# Patient Record
Sex: Female | Born: 1935
Health system: Southern US, Community
[De-identification: ages and names within clinical notes are randomized; demographics above are authoritative.]

## PROBLEM LIST (undated history)

## (undated) DIAGNOSIS — Z853 Personal history of malignant neoplasm of breast: Secondary | ICD-10-CM

## (undated) DIAGNOSIS — N3946 Mixed incontinence: Secondary | ICD-10-CM

## (undated) DIAGNOSIS — M545 Low back pain, unspecified: Secondary | ICD-10-CM

## (undated) DIAGNOSIS — IMO0001 Reserved for inherently not codable concepts without codable children: Secondary | ICD-10-CM

## (undated) DIAGNOSIS — N3281 Overactive bladder: Secondary | ICD-10-CM

## (undated) DIAGNOSIS — K219 Gastro-esophageal reflux disease without esophagitis: Secondary | ICD-10-CM

## (undated) DIAGNOSIS — K222 Esophageal obstruction: Secondary | ICD-10-CM

## (undated) DIAGNOSIS — R519 Headache, unspecified: Secondary | ICD-10-CM

## (undated) DIAGNOSIS — N329 Bladder disorder, unspecified: Secondary | ICD-10-CM

## (undated) DIAGNOSIS — I6930 Unspecified sequelae of cerebral infarction: Secondary | ICD-10-CM

## (undated) DIAGNOSIS — Z972 Presence of dental prosthetic device (complete) (partial): Secondary | ICD-10-CM

## (undated) DIAGNOSIS — Z9889 Other specified postprocedural states: Secondary | ICD-10-CM

## (undated) DIAGNOSIS — I5032 Chronic diastolic (congestive) heart failure: Secondary | ICD-10-CM

## (undated) DIAGNOSIS — K08109 Complete loss of teeth, unspecified cause, unspecified class: Secondary | ICD-10-CM

## (undated) DIAGNOSIS — Z9181 History of falling: Secondary | ICD-10-CM

## (undated) DIAGNOSIS — R51 Headache: Secondary | ICD-10-CM

## (undated) DIAGNOSIS — Z8719 Personal history of other diseases of the digestive system: Secondary | ICD-10-CM

## (undated) DIAGNOSIS — I872 Venous insufficiency (chronic) (peripheral): Secondary | ICD-10-CM

## (undated) DIAGNOSIS — H3530 Unspecified macular degeneration: Secondary | ICD-10-CM

## (undated) DIAGNOSIS — M5431 Sciatica, right side: Secondary | ICD-10-CM

## (undated) DIAGNOSIS — I10 Essential (primary) hypertension: Secondary | ICD-10-CM

## (undated) DIAGNOSIS — H5450 Low vision, one eye, unspecified eye: Secondary | ICD-10-CM

## (undated) DIAGNOSIS — R600 Localized edema: Secondary | ICD-10-CM

## (undated) DIAGNOSIS — I8390 Asymptomatic varicose veins of unspecified lower extremity: Secondary | ICD-10-CM

## (undated) DIAGNOSIS — M1990 Unspecified osteoarthritis, unspecified site: Secondary | ICD-10-CM

## (undated) DIAGNOSIS — J45909 Unspecified asthma, uncomplicated: Secondary | ICD-10-CM

## (undated) DIAGNOSIS — R269 Unspecified abnormalities of gait and mobility: Secondary | ICD-10-CM

## (undated) DIAGNOSIS — I2510 Atherosclerotic heart disease of native coronary artery without angina pectoris: Secondary | ICD-10-CM

## (undated) DIAGNOSIS — J449 Chronic obstructive pulmonary disease, unspecified: Secondary | ICD-10-CM

## (undated) DIAGNOSIS — R011 Cardiac murmur, unspecified: Secondary | ICD-10-CM

## (undated) DIAGNOSIS — F419 Anxiety disorder, unspecified: Secondary | ICD-10-CM

## (undated) DIAGNOSIS — M797 Fibromyalgia: Secondary | ICD-10-CM

## (undated) DIAGNOSIS — K5710 Diverticulosis of small intestine without perforation or abscess without bleeding: Secondary | ICD-10-CM

## (undated) DIAGNOSIS — R42 Dizziness and giddiness: Secondary | ICD-10-CM

## (undated) DIAGNOSIS — G8929 Other chronic pain: Secondary | ICD-10-CM

## (undated) HISTORY — PX: CARDIAC CATHETERIZATION: SHX172

## (undated) HISTORY — DX: Sciatica, right side: M54.31

## (undated) HISTORY — PX: CATARACT EXTRACTION W/ INTRAOCULAR LENS  IMPLANT, BILATERAL: SHX1307

---
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
## 1969-10-05 HISTORY — PX: ANTERIOR CERVICAL DECOMP/DISCECTOMY FUSION: SHX1161

## 1991-10-06 HISTORY — PX: ABDOMINAL ADHESION SURGERY: SHX90

## 1997-10-05 HISTORY — PX: MASTECTOMY, PARTIAL: SHX709

## 1999-07-12 ENCOUNTER — Emergency Department (HOSPITAL_COMMUNITY): Admission: EM | Admit: 1999-07-12 | Discharge: 1999-07-12 | Payer: Self-pay | Admitting: Emergency Medicine

## 1999-07-12 ENCOUNTER — Encounter: Payer: Self-pay | Admitting: Emergency Medicine

## 1999-11-10 ENCOUNTER — Emergency Department (HOSPITAL_COMMUNITY): Admission: EM | Admit: 1999-11-10 | Discharge: 1999-11-10 | Payer: Self-pay | Admitting: Emergency Medicine

## 1999-11-10 ENCOUNTER — Encounter: Payer: Self-pay | Admitting: Emergency Medicine

## 1999-12-08 ENCOUNTER — Ambulatory Visit (HOSPITAL_COMMUNITY): Admission: RE | Admit: 1999-12-08 | Discharge: 1999-12-08 | Payer: Self-pay | Admitting: Gastroenterology

## 1999-12-08 ENCOUNTER — Encounter: Payer: Self-pay | Admitting: Gastroenterology

## 2000-01-27 ENCOUNTER — Inpatient Hospital Stay (HOSPITAL_COMMUNITY): Admission: EM | Admit: 2000-01-27 | Discharge: 2000-01-29 | Payer: Self-pay | Admitting: Emergency Medicine

## 2000-01-27 ENCOUNTER — Encounter (INDEPENDENT_AMBULATORY_CARE_PROVIDER_SITE_OTHER): Payer: Self-pay | Admitting: Specialist

## 2000-01-30 ENCOUNTER — Inpatient Hospital Stay (HOSPITAL_COMMUNITY): Admission: EM | Admit: 2000-01-30 | Discharge: 2000-02-03 | Payer: Self-pay | Admitting: *Deleted

## 2000-01-30 ENCOUNTER — Encounter (INDEPENDENT_AMBULATORY_CARE_PROVIDER_SITE_OTHER): Payer: Self-pay | Admitting: *Deleted

## 2001-06-22 ENCOUNTER — Encounter: Admission: RE | Admit: 2001-06-22 | Discharge: 2001-06-22 | Payer: Self-pay | Admitting: Gastroenterology

## 2001-06-22 ENCOUNTER — Encounter: Payer: Self-pay | Admitting: Gastroenterology

## 2001-09-26 ENCOUNTER — Ambulatory Visit (HOSPITAL_COMMUNITY): Admission: RE | Admit: 2001-09-26 | Discharge: 2001-09-26 | Payer: Self-pay | Admitting: Gastroenterology

## 2001-09-26 ENCOUNTER — Encounter: Payer: Self-pay | Admitting: Gastroenterology

## 2001-11-29 ENCOUNTER — Ambulatory Visit (HOSPITAL_COMMUNITY): Admission: RE | Admit: 2001-11-29 | Discharge: 2001-11-30 | Payer: Self-pay | Admitting: Cardiology

## 2002-07-11 ENCOUNTER — Encounter: Payer: Self-pay | Admitting: Gastroenterology

## 2002-07-11 ENCOUNTER — Encounter: Admission: RE | Admit: 2002-07-11 | Discharge: 2002-07-11 | Payer: Self-pay | Admitting: Gastroenterology

## 2003-03-08 ENCOUNTER — Ambulatory Visit (HOSPITAL_COMMUNITY): Admission: RE | Admit: 2003-03-08 | Discharge: 2003-03-08 | Payer: Self-pay | Admitting: Gastroenterology

## 2003-03-08 ENCOUNTER — Encounter: Payer: Self-pay | Admitting: Gastroenterology

## 2003-06-04 ENCOUNTER — Encounter: Payer: Self-pay | Admitting: Gastroenterology

## 2003-06-04 ENCOUNTER — Encounter: Admission: RE | Admit: 2003-06-04 | Discharge: 2003-06-04 | Payer: Self-pay | Admitting: Gastroenterology

## 2003-06-25 ENCOUNTER — Encounter: Payer: Self-pay | Admitting: Emergency Medicine

## 2003-06-25 ENCOUNTER — Observation Stay (HOSPITAL_COMMUNITY): Admission: EM | Admit: 2003-06-25 | Discharge: 2003-06-26 | Payer: Self-pay | Admitting: Emergency Medicine

## 2003-06-26 ENCOUNTER — Encounter (INDEPENDENT_AMBULATORY_CARE_PROVIDER_SITE_OTHER): Payer: Self-pay | Admitting: Cardiology

## 2003-07-05 ENCOUNTER — Encounter: Payer: Self-pay | Admitting: Emergency Medicine

## 2003-07-05 ENCOUNTER — Emergency Department (HOSPITAL_COMMUNITY): Admission: EM | Admit: 2003-07-05 | Discharge: 2003-07-05 | Payer: Self-pay | Admitting: Emergency Medicine

## 2003-07-17 ENCOUNTER — Encounter: Payer: Self-pay | Admitting: Gastroenterology

## 2003-07-17 ENCOUNTER — Ambulatory Visit (HOSPITAL_COMMUNITY): Admission: RE | Admit: 2003-07-17 | Discharge: 2003-07-17 | Payer: Self-pay | Admitting: Gastroenterology

## 2003-10-06 HISTORY — PX: KNEE ARTHROSCOPY: SHX127

## 2004-07-29 ENCOUNTER — Ambulatory Visit (HOSPITAL_COMMUNITY): Admission: RE | Admit: 2004-07-29 | Discharge: 2004-07-29 | Payer: Self-pay | Admitting: Gastroenterology

## 2004-10-05 HISTORY — PX: BREAST LUMPECTOMY: SHX2

## 2004-11-10 ENCOUNTER — Ambulatory Visit: Payer: Self-pay | Admitting: General Surgery

## 2005-06-02 ENCOUNTER — Ambulatory Visit: Payer: Self-pay | Admitting: General Surgery

## 2005-06-23 ENCOUNTER — Ambulatory Visit: Payer: Self-pay | Admitting: General Surgery

## 2005-08-04 ENCOUNTER — Other Ambulatory Visit: Payer: Self-pay

## 2005-08-04 ENCOUNTER — Ambulatory Visit: Payer: Self-pay | Admitting: General Surgery

## 2005-08-11 ENCOUNTER — Ambulatory Visit: Payer: Self-pay | Admitting: General Surgery

## 2005-08-24 ENCOUNTER — Ambulatory Visit: Payer: Self-pay | Admitting: Oncology

## 2005-09-11 ENCOUNTER — Ambulatory Visit: Payer: Self-pay | Admitting: Radiation Oncology

## 2005-10-05 ENCOUNTER — Ambulatory Visit: Payer: Self-pay | Admitting: Radiation Oncology

## 2005-11-05 ENCOUNTER — Ambulatory Visit: Payer: Self-pay | Admitting: Radiation Oncology

## 2005-12-03 ENCOUNTER — Ambulatory Visit: Payer: Self-pay | Admitting: Radiation Oncology

## 2006-02-24 ENCOUNTER — Ambulatory Visit: Payer: Self-pay | Admitting: Oncology

## 2006-06-14 ENCOUNTER — Ambulatory Visit: Payer: Self-pay | Admitting: General Surgery

## 2006-10-05 HISTORY — PX: PATELLA FRACTURE SURGERY: SHX735

## 2006-12-13 ENCOUNTER — Ambulatory Visit: Payer: Self-pay | Admitting: General Surgery

## 2007-06-15 ENCOUNTER — Ambulatory Visit: Payer: Self-pay | Admitting: General Surgery

## 2007-08-08 ENCOUNTER — Ambulatory Visit: Payer: Self-pay | Admitting: General Surgery

## 2007-09-16 ENCOUNTER — Ambulatory Visit: Payer: Self-pay | Admitting: General Surgery

## 2007-09-20 ENCOUNTER — Inpatient Hospital Stay: Payer: Self-pay | Admitting: General Surgery

## 2007-11-28 ENCOUNTER — Encounter: Admission: RE | Admit: 2007-11-28 | Discharge: 2007-11-28 | Payer: Self-pay | Admitting: Gastroenterology

## 2007-12-04 ENCOUNTER — Ambulatory Visit: Payer: Self-pay | Admitting: Radiation Oncology

## 2008-01-04 ENCOUNTER — Ambulatory Visit: Payer: Self-pay | Admitting: Radiation Oncology

## 2008-02-03 ENCOUNTER — Ambulatory Visit: Payer: Self-pay | Admitting: Radiation Oncology

## 2008-03-05 ENCOUNTER — Ambulatory Visit: Payer: Self-pay | Admitting: Radiation Oncology

## 2008-06-05 ENCOUNTER — Ambulatory Visit: Payer: Self-pay | Admitting: Radiation Oncology

## 2008-06-18 ENCOUNTER — Ambulatory Visit: Payer: Self-pay | Admitting: General Surgery

## 2010-06-26 ENCOUNTER — Ambulatory Visit: Payer: Self-pay | Admitting: General Surgery

## 2011-01-07 ENCOUNTER — Emergency Department (HOSPITAL_COMMUNITY)
Admission: EM | Admit: 2011-01-07 | Discharge: 2011-01-07 | Disposition: A | Discharge status: Home / Self Care | Payer: Medicare Other | Attending: Emergency Medicine | Admitting: Emergency Medicine

## 2011-01-07 DIAGNOSIS — R197 Diarrhea, unspecified: Secondary | ICD-10-CM | POA: Insufficient documentation

## 2011-01-07 DIAGNOSIS — K5289 Other specified noninfective gastroenteritis and colitis: Secondary | ICD-10-CM | POA: Insufficient documentation

## 2011-01-07 DIAGNOSIS — Z79899 Other long term (current) drug therapy: Secondary | ICD-10-CM | POA: Insufficient documentation

## 2011-01-07 DIAGNOSIS — E876 Hypokalemia: Secondary | ICD-10-CM | POA: Insufficient documentation

## 2011-01-07 DIAGNOSIS — R109 Unspecified abdominal pain: Secondary | ICD-10-CM | POA: Insufficient documentation

## 2011-01-07 DIAGNOSIS — K5730 Diverticulosis of large intestine without perforation or abscess without bleeding: Secondary | ICD-10-CM | POA: Insufficient documentation

## 2011-01-07 DIAGNOSIS — Z853 Personal history of malignant neoplasm of breast: Secondary | ICD-10-CM | POA: Insufficient documentation

## 2011-01-07 LAB — URINALYSIS, ROUTINE W REFLEX MICROSCOPIC
Glucose, UA: NEGATIVE mg/dL
Protein, ur: NEGATIVE mg/dL
Urobilinogen, UA: 0.2 mg/dL (ref 0.0–1.0)

## 2011-01-07 LAB — COMPREHENSIVE METABOLIC PANEL
Alkaline Phosphatase: 112 U/L (ref 39–117)
BUN: 31 mg/dL — ABNORMAL HIGH (ref 6–23)
CO2: 26 mEq/L (ref 19–32)
Chloride: 96 mEq/L (ref 96–112)
Creatinine, Ser: 1.76 mg/dL — ABNORMAL HIGH (ref 0.4–1.2)
GFR calc non Af Amer: 28 mL/min — ABNORMAL LOW (ref >60)
Glucose, Bld: 105 mg/dL — ABNORMAL HIGH (ref 70–99)
Potassium: 3 mEq/L — ABNORMAL LOW (ref 3.5–5.1)
Total Bilirubin: 0.6 mg/dL (ref 0.3–1.2)

## 2011-01-07 LAB — DIFFERENTIAL
Basophils Absolute: 0 10*3/uL (ref 0.0–0.1)
Basophils Relative: 0 % (ref 0–1)
Eosinophils Absolute: 0.1 10*3/uL (ref 0.0–0.7)
Lymphs Abs: 1.5 10*3/uL (ref 0.7–4.0)
Monocytes Relative: 8 % (ref 3–12)

## 2011-01-07 LAB — CBC
HCT: 39.8 % (ref 36.0–46.0)
Hemoglobin: 12.8 g/dL (ref 12.0–15.0)
MCH: 27 pg (ref 26.0–34.0)
MCV: 84 fL (ref 78.0–100.0)
RBC: 4.74 MIL/uL (ref 3.87–5.11)
WBC: 12.7 10*3/uL — ABNORMAL HIGH (ref 4.0–10.5)

## 2011-01-10 ENCOUNTER — Emergency Department (HOSPITAL_COMMUNITY)
Admission: EM | Admit: 2011-01-10 | Discharge: 2011-01-10 | Disposition: A | Discharge status: Home / Self Care | Payer: Medicare Other | Attending: Emergency Medicine | Admitting: Emergency Medicine

## 2011-01-10 DIAGNOSIS — K219 Gastro-esophageal reflux disease without esophagitis: Secondary | ICD-10-CM | POA: Insufficient documentation

## 2011-01-10 DIAGNOSIS — R0789 Other chest pain: Secondary | ICD-10-CM | POA: Insufficient documentation

## 2011-02-20 NOTE — Procedures (Signed)
[place]. [facility]  Patient:    Kristie Morris, Kristie Morris                        MRN: [id number] Proc. Date: 01/27/00 Adm. Date:  [id number] Attending:  Ernie Avena                           Procedure Report  DATE OF BIRTH:  1925-01-19  PROCEDURE PERFORMED:  Colonoscopy with biopsies.  ENDOSCOPIST:  Cleotis Nipper, M.D.  INDICATIONS FOR PROCEDURE:  The patient is a 77 year old with severe profuse watery diarrhea of 48 hours duration beginning on day 5 of Avelox, but also occurring after eating meatloaf.  Of note, the patient has a prior history of Clostridium difficile infection approximately seven years ago.  FINDINGS:  Patchy hemorrhagic pancolitis.  DESCRIPTION OF PROCEDURE:  The nature, purpose and risks of the procedure were familiar to the patient, who provided written consent.  Sedation was fentanyl 75 mcg and Versed 7 mg IV without arrhythmias or desaturation.  The Olympus adjustable tension pediatric video colonoscope was advanced with slight difficulty through an angulated sigmoid region around the colon to what I believe was the area just above the cecum, as demonstrated by transillumination of the right lower quadrant of the abdomen.   Pullback was then performed.  There was a fair amount of liquid stool in the colon which was suctioned and sent for analysis to include enteric pathogens, O&P, Clostridium difficile and white blood cells.  The striking finding on this exam was patchy mucosal hemorrhage involving pretty much all areas of the colon except relative sparing of the rectum.  In the sigmoid region, there was some exudative character to this with granularity as well.  The intervening colonic mucosa still had a normal vascular pattern.  This did not look like classic pseudomembranous colitis in that no obvious pseudomembranes or white plaques were observed.  It did not look like ulcerative colitis or ischemic colitis,  either.  Multiple biopsies were obtained from the areas of mucosal hemorrhage as well as from the areas of exudate and retroflexion was not performed.  The patient tolerated the procedure well.  There were no apparent complications.  IMPRESSION:  Colitis of undetermined etiology. In view of her antecedent exposure to antibiotics, I will empirically start oral vancomycin and it should be noted that the patient has a prior history of an adverse reaction to metronidazole.  We will await pathology on todays biopsies. DD:  01/27/00 TD:  01/28/00 Job: [id number]

## 2011-02-20 NOTE — Op Note (Signed)
NAME:  Kristie Morris, Kristie Morris                           ACCOUNT NO.:  [id number]   MEDICAL RECORD NO.:  [id number]                   PATIENT TYPE:  AMB   LOCATION:  ENDO                                 FACILITY:  [facility]   PHYSICIAN:  Ronald Lobo, M.D.                DATE OF BIRTH:  16-Jan-1925   DATE OF PROCEDURE:  03/08/2003  DATE OF DISCHARGE:                                 OPERATIVE REPORT   PROCEDURE:  Upper endoscopy with Savary dilatation of the esophagus under  fluoroscopy.   INDICATIONS:  A 77 year old female with nonspecific upper abdominal symptoms  following a severe nausea and vomiting episode, apparent food poisoning,  about six weeks ago.  She has maintained persistent dyspeptic  symptomatology.  She also has dysphagia symptoms and is a year and a half  status post esophageal dilatation of an esophageal ring.   FINDINGS:  Normal exam except for small hiatal hernia with Schatzki's ring,  dilated to 18 mm.   PROCEDURE:  The nature, purpose, and risks of the procedure had been  discussed with the patient previously, and she was familiar with the  procedure and provided written consent.  Sedation was fentanyl 70 mcg and  Versed 7 mg IV without arrhythmias or desaturation.  There was some trouble  getting a reliable blood pressure reading because of the patient's body  habitus, which made the cuffs hard to stay on her extremities.   In any event, the Olympus small-caliber adult video endoscope is passed  under direct vision.  The vocal cords were not well-seen.  The esophagus was  readily entered and was normal in its entirety.  Specifically, I saw no  evidence of Barrett's esophagus or reflux esophagitis or free reflux nor any  varices, infection, or neoplasia.  There was a well-defined esophageal  mucosal ring at the squamocolumnar junction, and below this was a 2 cm  hiatal hernia.   The stomach contained no significant residual and really had normal mucosa.  There may  have been a little bit of flaky bilious residual but nothing  really impressive.  No frank gastritis, erosions, ulcers, polyps, or masses  were observed, including a retroflexed view of the proximal stomach.  The  pylorus, duodenal bulb, and second duodenum also looked normal.   Savary dilatation was then performed in the standard fashion.  The spring-  tipped guidewire was passed through the scope into the stomach and the scope  was removed in an exchange fashion, and a single passage with an 18 mm  Savary dilator was performed under fluoroscopic guidance, confirming passage  of the widest portion of the dilator below the level of the diaphragm.  The  patient was then re-endoscoped under direct vision.  The ring did not appear  to be fractured, and there was no evidence of undue trauma to the stomach,  esophagus, or hypopharynx.   The patient tolerated the  procedure well, and there were no apparent  complications.    IMPRESSION:  Small hiatal hernia with esophageal ring, probably accounting  for the patient's dysphagia symptoms.  Dilatation performed to 18 mm.  However, no source of nonspecific abdominal discomfort or dyspeptic  symptomatology identified.   PLAN:  Empiric trial of increasing Aciphex to twice-daily dosing.                                                Ronald Lobo, M.D.    RB/MEDQ  D:  03/08/2003  T:  03/08/2003  Job:  [id number]

## 2011-02-20 NOTE — Procedures (Signed)
[place]. [facility]  Patient:    Kristie Morris, Kristie Morris                        MRN: [id number] Proc. Date: 02/02/00 Adm. Date:  [id number] Attending:  Ernie Avena                           Procedure Report  PROCEDURE PERFORMED:  Colonoscopy with biopsies.  ENDOSCOPIST:  Cleotis Nipper, M.D.  INDICATIONS FOR PROCEDURE:  Follow-up of moderately severe hemorrhagic colitis in a 77 year old female with a remote history of "Crohns disease" and also a more recent history of pseudomembranous colitis several years ago.  Her recent colonoscopy was nonspecific in terms of the appearance of the colitis and Clostridium difficile toxin assay was negative.  She seemed to improve on a very brief course of vancomycin and was discharged home only to have a recurrence of symptoms within approximately 36 hours of the time of discharge, prompting readmission.  She is again feeling better at this time, having had a marked exacerbation of symptoms as recently as yesterday.  The purpose of this procedure is to see where we stand in the course of her colitis and to help facilitate discharge planning.  FINDINGS:  Mild residual colitis present.  DESCRIPTION OF PROCEDURE:  The nature, purpose and risks of the procedure were familiar to the patient, who provided written consent and was brought from her hospital room to the endoscopy unit.  The procedure was done unprepped. Sedation was fentanyl 100 mcg and Versed 8 mg IV without arrhythmias or desaturation.  Transiently, her oxygen saturation went down to 89 or 90% but without other evidence of clinical stability and it was not really a problem during the course of this procedure.  The Olympus pediatric adjustable tension colonoscope was advanced around the colon to a very fixated sigmoid region to the region of the midascending colon as evidenced by transillumination of the right midabdominal region.  Pullback was then  performed.  The cecum was not reached during this exam because there was quite a bit of formed stool present.  There was no liquid stool present.  The colonic mucosa looked substantially better than it did four days earlier. Specifically, I did not see as many mucosal hemorrhages as had been the case four days ago, and the distal colon in fact had an essentially normal colonic mucosal appearance.  Random mucosal biopsies were obtained along the length of the colon.  There was severe sigmoid diverticulosis as previously noted; however, no polyps, cancer or vascular malformations were observed.  The patient tolerated the procedure well and there were no apparent complications.  Retroflexion was not performed in the rectum.  IMPRESSION:  Resolving colitis of indeterminate cause.  PLAN:  Continue vancomycin for the time being. DD:  02/02/00 TD:  02/03/00 Job: [id number]

## 2011-02-20 NOTE — Cardiovascular Report (Signed)
[place]. [facility]  Patient:    Kristie Morris, Kristie Morris Visit Number: [id number] MRN: [id number]          Service Type: CAT Location: [place] [facility] Attending Physician:  Fransico Him Dictated by:   Fransico Him, M.D. Proc. Date: 11/29/01 Admit Date:  11/29/2001   CC:         Cleotis Nipper, M.D.  Malissa Hippo, M.D., Cheryln Manly. Pract.   Cardiac Catheterization  PROCEDURE:  Left heart catheterization, coronary angiography, left ventriculography.  SURGEON:  Fransico Him, M.D.  INDICATIONS:  Chest pain.  COMPLICATIONS:  None.  IV ACCESS:  Right femoral artery, 6 French sheath.  INDICATIONS:  This is a 77 year old white female with a history of obesity, hypertension, esophageal problems, and a family history of coronary artery disease who presents with episodes of severe diaphoresis, shortness of breath, and chest pain.  She does have a history of esophageal spasm. Her chest pain is relieved with nitroglycerin.  The patient is brought to the cardiac catheterization laboratory in the fasting nonsedated state.  Informed consent was obtained.  The patient was connected to continuous heart rate and pulse oximetry monitoring, intermittent blood pressure monitoring.  The right groin was prepped and draped in the usual sterile fashion.  1% Xylocaine was used for local anesthesia.  Using the modified Seldinger technique, a 6 French sheath was placed in the right femoral artery.  Under fluoroscopic guidance, a 6 [place] JL4 catheter was placed in the left coronary artery.  Multiple cine films were taken in a 30 degree RAO and 40 degree LAO views.  This catheter was then exchanged out over a guide wire for a 6 [place] JR4 catheter which was placed under fluoroscopic guidance into the right coronary artery.  Multiple cine films were taken in 30 degree RAO and 40 degree LAO views.  This catheter was then exchanged out over a guide wire for a 6 French angled  pigtail catheter.  This was placed in the left ventricular cavity under fluoroscopic guidance.  Left ventriculography was performed in the 30 degree RAO view using a total of 30 cc of contrast at 13 cc per second.  The catheter was then pulled back across the aortic valve with no significant gradient noted. At the end of the procedure, all catheters and sheaths were removed.  Manuel compression was performed until adequate hemostasis was obtained.  The patient was transferred back to the room in stable condition.  RESULTS:  The left main coronary artery is widely patent and bifurcates into a left anterior descending artery and left circumflex artery.  The left anterior descending artery has 20% proximal luminal irregularities and gives rise to a first diagonal branch which has a proximal 30% narrowing. The rest of the LAD is widely patent throughout its course.  The left circumflex is widely patent throughout its course and gives rise to one large branching obtuse marginal branch which is widely patent.  The right coronary artery has a mid 50% narrowing before bifurcating into the posterior descending artery and posterolateral artery both of which are widely patent.  Left ventriculography performed in 30 degree RAO view using a total of 30 cc of contrast at 13 cc per second shows normal LV function, no wall motion abnormalities.  Left ventricular pressure is 127/15 mmHg.  Aortic pressure is 127/66 mmHg.  ASSESSMENT: 1. Noncardiac chest pain. 2. Nonobstructive coronary artery disease. 3. Normal left ventricular function.  PLAN:  Discharge to home.  Follow up  outpatient Cardiolite to assess 50% mid RCA stenosis.  Follow up with Dr. Radford Pax in two to three weeks. Two-dimensional echocardiogram as an outpatient to rule out diastolic dysfunction as an etiology of shortness of breath. Dictated by:   Fransico Him, M.D. Attending Physician:  Fransico Him DD:  11/29/01 TD:   11/29/01 Job: [id number]

## 2011-02-20 NOTE — H&P (Signed)
NAME:  Kristie Morris, Kristie Morris                           ACCOUNT NO.:  [id number]   MEDICAL RECORD NO.:  [id number]                   PATIENT TYPE:  INP   LOCATION:  [place]                                 FACILITY:  [facility]   PHYSICIAN:  Virgilio Belling. Ezzie Dural, MD             DATE OF BIRTH:  27-Jan-1925   DATE OF ADMISSION:  06/25/2003  DATE OF DISCHARGE:                                HISTORY & PHYSICAL   REASON FOR ADMISSION:  Kristie Morris is a 77 year old white woman who is  admitted to [facility] for further evaluation of chest  pain.   HISTORY OF PRESENT ILLNESS:  The patient, who has no past history of cardiac  disease, presented to the emergency department with a five day history of  chest discomfort.  The chest discomfort is described as a lower substernal  pressure.  It is sometimes associated with a sharp discomfort radiating to  the left shoulder.  Her chest pressure has been present continuously for the  last five days, though it may wax and wane in intensity.  It is associated  with mild dyspnea, diaphoresis, or nausea.  It appears not to be related to  position, activity, meals, or respirations.  Other than food, which seems to  help the discomfort temporarily, there are no other exacerbating factors.  Her discomfort is nearly resolved at this time but is still present to a  slight extent.   As noted, the patient has no past history of cardiac disease including no  history of chest pain, myocardial infarction, coronary artery disease,  congestive heart failure, or arrhythmias.  There is a history of  hypertension.  There is no history of hyperlipidemia, diabetes mellitus, or  smoking.  There is a family history of coronary artery disease; a sister  suffered from coronary artery disease in her 38s and her mother suffered  from coronary artery disease in her 66s.   The patient, in addition to the aforementioned problems, the patient has a  history of gastroesophageal  reflux.  She is currently being evaluated by her  primary care physician for abdominal discomfort and diarrhea of several  months duration.  The patient has a history of a right mastectomy for breast  cancer.   ALLERGIES:  She is allergic to a number medications including PENICILLIN,  SULFA, CELEBREX, ERY-TAD, METRONIDAZOLE, VANCOCIN, and VIOXX.   MEDICATIONS:  1. Aciphex for her blood pressure.  2. An unknown antihypertensive.  3. Diovan 80 mg p.o. q.d.  4. Advair 100/50 mg p.o. b.i.d.  5. Aspirin 81 mg p.o. q.d.   SOCIAL HISTORY:  The patient works as an Optometrist.  She lives with her  husband.  She does not smoke as noted above nor does she drink.   PAST SURGICAL HISTORY:  1. Right mastectomy as described above.  2. Hysterectomy.   PAST MEDICAL HISTORY:  There have been no significant injuries.  FAMILY HISTORY:  As described above.   REVIEW OF SYMPTOMS:  Reveals no problems related to her head, eyes, ears,  nose, mouth, throat, lungs, gastrointestinal system, genitourinary system,  or extremities.  There is no history of neurologic or psychiatric disorder.  There is no history of fevers, chills, or weight loss.   PHYSICAL EXAMINATION:  VITAL SIGNS:  Blood pressure 180/85, pulse 86 and  regular, respirations 22, temperature 97.3.  GENERAL:  The patient was an obese, older white woman in no discomfort.  She  was alert, oriented, appropriate, and responsive.  HEENT:  Normal.  NECK:  Without thyromegaly or adenopathy.  Carotid pulses were palpable  bilaterally and without bruits.  CARDIOVASCULAR:  Normal S1 and S2.  There was no S3, S4, murmur, rub, or  click.  Cardiac rhythm was regular.  CHEST:  No chest wall tenderness was noted.  LUNGS:  Clear.  ABDOMEN:  Soft and nontender.  There was no mass, hepatosplenomegaly, bruit,  distention, rebound, guarding, or rigidity.  Bowel sounds were normal.  BREASTS:  Not performed as it was not pertinent to the reason for acute  care  hospitalization.  PELVIC:  Not performed as it was not pertinent to the reason for acute care  hospitalization.  RECTAL:  Not performed as it was not pertinent to the reason for acute care  hospitalization.  EXTREMITIES:  Without edema, deviation, or deformity.  Radial and dorsalis  pedal pulses were palpable bilaterally.  NEUROLOGICAL:  Brief screening neurologic survey was unremarkable.   LABORATORY DATA:  Electrocardiogram was normal.  Occasional premature  ectopic complexes were noted.  The chest radiograph report was pending at  the time of this dictation.   The initial CK-MB was less than 1 with a myoglobin of 68.6 and troponin less  than 0.05.  The second set reveals a CK-MB of 1.6 with a myoglobin of 56.9  and troponin of less than 0.05.  The third set revealed a CK-MB of 6.3 with  a myoglobin of greater than 500 and a troponin less than 0.05.  BUN was 14,  creatinine 0.6, and potassium 3.9.  The remaining studies were pending at  the time of this dictation.   IMPRESSION:  1. Chest pain, rule out coronary artery disease.  2. Gastroesophageal reflux disease.  3. Hypertension.  4. Status post right mastectomy for breast cancer.   PLAN:  1. Telemetry.  2. Serial cardiac enzymes.  3. Aspirin.  4. Lovenox.  5. Nitroglycerin paste.  6.     Fasting lipid profile.  7. Further evaluation per Dr. Fransico Him.   DISPOSITION:  The patient's husband was present throughout the patient's  encounter.                                                Virgilio Belling. Ezzie Dural, MD    MSC/MEDQ  D:  06/25/2003  T:  06/26/2003  Job:  [id number]   cc:   Virgilio Belling. Ezzie Dural, [place]  [place], [place] [address]  Fax: [phone]   Fransico Him, M.D.  [place]. [address] [place]  [place], [place] [address]  Fax: [phone]

## 2011-02-20 NOTE — Procedures (Signed)
[facility]  Patient:    Kristie Morris, Kristie Morris Visit Number: [id number] MRN: [id number]          Service Type: END Location: ENDO Attending Physician:  Ernie Avena Dictated by:   Cleotis Nipper, M.D. Proc. Date: 09/26/01 Admit Date:  09/26/2001                             Procedure Report  PROCEDURE:  Upper endoscopy with Savary dilatation of the esophagus.  INDICATIONS FOR PROCEDURE:  A 77 year old female with recurrent dysphagia symptoms, last dilated to 18 mm about a year and a half ago. Also, this patient has a history of dilated bile duct so duodenoscopy was going to be performed as part of this procedure to rule out any gross ampullary tumor.  FINDINGS:  The esophageal ring dilated to 18 mm. Duodenoscopy shows the ampulla to apparently be in the diverticulum but no obvious duodenal tumor identified.  DESCRIPTION OF PROCEDURE:  The nature, purpose and risk of the Savary dilatation were familiar to the patient from prior examination and she provided written consent. Sedation for this procedure and the sigmoidoscopy which followed it totaled fentanyl 100 mcg and Versed 9 mg IV without arrhythmias or desaturation. The Olympus video endoscope was passed under direct vision entering the esophagus without undue difficulty. The vocal cords are not well seen. The esophagus looked normal. No reflux esophagitis, Barretts esophagus, varices, infection or neoplasia were appreciated. There was a well defined esophageal ring of the squamocolumnar junction and below this was a 2 cm hiatal hernia. The stomach contained no significant residual and had normal mucosa without evidence of gastritis, erosions, ulcers, polyps, or masses including a retroflexed view of the proximal stomach. The pylorus, duodenal bulb and the second duodenum looked normal.  Savary dilatation was performed in the standard fashion. The spring-tip guidewire was passed through  the scope into the antrum of the stomach and the endoscope was removed in an exchange fashion, leaving the guidewire in place. Using fluoroscopic guidance and confirmation of appropriate positioning of the wire and passage of the widest portion of the dilators below the level of the diaphragm, sequential passage was made with Savary dilators, sizes 16 and 18 mm. These encountered fairly smooth resistance, perhaps a slight "click" as they passed through the region of the esophageal ring. The patient was then re-endoscope under direct vision which showed no evidence of fracture of the ring nor any undue trauma to the hypopharynx, esophagus or stomach.  The scope was removed and the patient was examined with a duodenoscope which passed into the duodenum with minimal difficulty but had a little bit of resistance getting past the junction of the duodenal bulb and the second portion of the duodenum. After manipulating the scope to get through that area, where there was a little bit of mucosal trauma identified but no evidence of esophageal perforation, I never really visualized the ampulla but the "longitudinal fold" that ordinarily leads up to the papilla lead up to a diverticulum which is felt to most likely harbor the papilla.  The scope was removed from the patient. The patient tolerated the procedure well and there were no apparent complications.  IMPRESSION:  1. Esophageal ring, dilated to 18 mm.  2. Small hiatal hernia.  3. 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     Perhaps this in some way contributes to the dilated bowel duct observed  radiographically.  PLAN:  No further action needed at this time. Clinical follow-up of dysphagia symptoms.Dictated by:   Cleotis Nipper, M.D. Attending Physician:  Ernie Avena DD:  09/26/01 TD:  09/27/01 Job: [id number]

## 2011-02-20 NOTE — Discharge Summary (Signed)
. [facility]  Patient:    Kristie Morris, Kristie Morris                        MRN: [id number] Adm. Date:  [id number] Disc. Date: [id number] Attending:  Ernie Avena Dictator:   Evalee Jefferson, P.A.                           Discharge Summary  DATE OF BIRTH: Feb 08, 1925  REASON FOR ADMISSION: Diarrhea and abdominal pain, questionable pseudomembranous colitis.  FINAL DIAGNOSES:  1. Colitis.  2. History of irritable bowel syndrome.  3. History of breast cancer.  4. History of Clostridium difficile colitis.  5. Possible hypertension.  CONSULTATIONS: None.  COMPLICATIONS: None.  PROCEDURES: On February 02, 2000 the patient underwent a repeat unprepped colonoscopy, which revealed a mild residual colitis.  This was biopsied and those biopsies are currently pending.  There was also a formed stool present and some evidence of sigmoid diverticulosis.  LABORATORY DATA: On the date of admission, January 30, 2000, CBC was obtained with the following parameters: WBC 8.6, hemoglobin 12.5, hematocrit 37.2, platelet count 342,000.  Additionally, a CMET obtained on the date of admission revealed a sodium of 140, potassium 3.2, chloride 104, CO2 27, BUN 5, creatinine 0.6, and glucose 97.  Additionally, LFTs were within normal range and total bilirubin was 0.5.  Alkaline phosphatase was mildly borderline elevated at 118, normal range 39-117.  Stool studies and cultures obtained the day of admission were unrevealing, with no ova or parasites seen, and no suspicious colonies per routine stool cultures.  No fecal white blood cells seen, and C. difficile toxin again negative.  A sedimentation rate obtained on February 02, 2000 came back mildly elevated at 42, normal range 0-25.  HOSPITAL COURSE: The patient was readmitted on January 30, 2000 after a relapse within 24 hours of being discharged for diarrhea and severe abdominal cramping.  She was admitted for supportive care  including IV fluids to prevent dehydration, and was also empirically placed on vancomycin for presumed C. difficile infection.  Of note, the patient is allergic to METRONIDAZOLE.  She was symptomatic from a GI tract standpoint for the next two days of admission, significant for very intense abdominal cramping along with multiple loose bowel movements, triggered by any oral intake, including just sips of liquid. Her vital signs remained unremarkable during the course of her admission with the exception of some evidence of systolic hypertension with systolic numbers ranging from 142 to a high of 188.  She remained afebrile during the course of her stay, with high temperature being recorded at 99.1 degrees.  By Sunday of her admission, which was February 01, 2000, she reported a decrease in her symptoms, having two loose bowel movements shortly after breakfast but then none until that evening approximately one hour after her dinner.  By this point in time her nausea had resolved and by the morning of February 02, 2000 she felt back to her baseline regarding bowel habits and she was experiencing no more abdominal cramping.  Since the patient had so recently returned to [place] after a 24 hour alleviation of symptoms she was held 24 hours longer to ensure that her symptoms are truly resolved and that this was not just a fleeting improvement in her symptoms.  Additionally, on February 02, 2000 she underwent an unflexed colonoscopy with sedation, which revealed a significant improvement in  her colitis as documented by the flexible sigmoidoscopy which was performed on her previous admission.  There was formed stool present in her colon and with the exception of moderate sigmoid diverticulosis was otherwise unrevealing.  Biopsies were obtained during this unprepped colonoscopy and they are pending at this time.  By the morning of Feb 03, 2000 she was still asymptomatic and reported having two to three bowel  movements yesterday evening after her evening supper, which is baseline for her.  Her abdomen is pain-free at this time, although she does report a slight left lower quadrant cramping and churning sensation, but reports that this is very baseline for her as she does have recurrent symptoms of irritable bowel syndrome.  Since she has maintained a relatively asymptomatic course over the past 24 hours she will be discharged to home today.  DISPOSITION: Discharged to home.  FOLLOW-UP:  She has a follow-up appointment in our office scheduled for Feb 10, 2000.  DISCHARGE MEDICATIONS:  1. Vancomycin 250 mg 1 tablet t.i.d. for five days, then 2 tablets for     an additional five days, for a total of a ten day course.  2. She may resume all other home medications.  DISCHARGE ACTIVITY: Without restrictions.  DISCHARGE DIET: Without restrictions.  SPECIAL INSTRUCTIONS:  1. Follow up with local health clinic for blood pressure control.  2. She was advised to call us if her symptoms of colitis recur between     now and the time of her follow-up visit on Feb 10, 2000.  DISCHARGE CONDITION: Improved. DD:  02/03/00 TD:  02/04/00 Job: [id number]

## 2011-02-20 NOTE — Procedures (Signed)
[place]. [facility]  Patient:    Kristie Morris, Kristie Morris                        MRN: [id number] Proc. Date: 12/08/99 Adm. Date:  [id number] Attending:  Ernie Avena                           Procedure Report  PROCEDURE:  Upper endoscopy with Savary dilatation of the esophagus.  ENDOSCOPIST:  Cleotis Nipper, M.D.  ANESTHESIA:  INDICATIONS:  Recurrent _____ symptoms in a 77 year old female, now about three  years status post Savary dilatation of a Schatzkis ring.  FINDINGS:  Schatzkis ring dilated with 18 mm dilator.  DESCRIPTION OF PROCEDURE:  The nature, purpose, and risks of the procedure were  familiar to the patient from prior exams, and she provided written consent. Sedation was fentanyl 100 mcg and Versed 10 mg IV, prior to and during the course of this procedure, and the colonoscopy which followed it.  The patient did show early desaturation with 02 saturations getting down to about 85%, but she remained otherwise grossly clinically stable, and by stimulating the patient, we were able to get the oxygen saturation up into the high 80s and 90s for the remainder of the procedure.  The Olympus video endoscope was passed under direct vision.  The vocal cords were not well-seen, but no gross laryngeal abnormalities were identified.  The esophagus was entered under direct vision without undue difficulty.  The esophageal mucosa was normal, specifically without evidence of reflux esophagitis, Barretts esophagus, varices, infection, or neoplasia.  There was a well-defined, but fairly widely-patent esophageal mucosal ring at the squamocolumnar junction without abnormalities of the esophageal mucosa above it.  Below this was a 2 cm hiatal hernia.  The stomach was entered, with the ring offering no resistance to passage of the scope.  The stomach was normal without evidence of gastritis, erosions, ulcers, polyps, or masses, and retroflexed  view of the proximal stomach was unremarkable apart from a small hiatal hernia.  The pylorus, duodenal bulb, and the second duodenum looked normal.  Savary dilatation was then performed in the standard fashion.  The spring-tip guidewire was passed through the scope into the antrum of the stomach, and a single passage was made using an 18 mm Savary dilator after appropriate fluoroscopic confirmation of correct position and of the wire.  The widest portion of the dilator was noted to pass below the level of the diaphragm.  The wire and the dilator were then removed, and the patient was re-endoscoped under direct vision, which disclosed no obvious fracture of the ring, and no evidence of undue trauma to the stomach, esophagus, or hypopharynx.  The patient tolerated the procedure well and there were no apparent complications.  IMPRESSION:  Schatzkis ring above hiatal hernia, dilated to 18 mm.  PLAN:  Clinical follow up of dysphagia symptoms. DD:  12/08/99 TD:  12/09/99 Job: [id number]

## 2011-02-20 NOTE — H&P (Signed)
[place]. [facility]  Patient:    Kristie Morris, Kristie Morris                        MRN: [id number] Adm. Date:  [id number] Attending:  Ernie Avena Dictator:   Evalee Jefferson, P.A.-C.                         History and Physical  DATE OF BIRTH:  January 29, 1933  CHIEF COMPLAINT:  Diarrhea and abdominal cramping.  HISTORY OF PRESENT ILLNESS:  Patient is a 77 year old white female who was discharged from our service yesterday morning after a two-day stay for protracted diarrhea, nausea, and intense abdominal cramping and dehydration. Yesterday morning, patient was much improved, having had no emesis in 12 hours and only one bowel movement, during the same period of time which she described as being quite firm, almost to the point of constipation. Additionally, her abdominal cramping was significantly reduced; however, it was still present but tolerable off of pain medicine.  Since stool cultures had come back negative for C. difficile infection, she was discharged without continuing vancomycin therapy.  She was originally empirically placed on vancomycin for questionable pseudomembranous colitis, as she is allergic to metronidazole.  Since her discharge, patient reports that she had a symptom-free afternoon yesterday, but, at approximately 3:00 to 4:00 p.m., started to experience frequent bowel movements, perhaps four over a two-to-three-hour period.  These movements were quite loose, but there was no abdominal pain associated with the movement.  Again, after her evening meal, she began to experience further abdominal pain and cramping which intensified until approximately 10 oclock yesterday evening.  Over this period of time, she had numerous bowel movements, reporting having to run to the restroom every 5 to 10 minutes, and relieve herself with a small amount of of very watery diarrhea.  This water to begin with was very black in character but did clear to the  point where it is now a medium-brown brown water.  She was able to sleep through the night but then awoke this morning at 5:00 a.m. with very intense cramping abdominal pain and, again, multiple bowel movements.  She has basically remained on the toilet all morning long and, at this point, is feeling significantly fatigued and is becoming progressively dizzy as she notices when she stands.  She is going to be readmitted this afternoon for supportive care and further evaluation of the etiology of her symptoms.  PAST MEDICAL HISTORY:  Please see previous history and physical dictation.  CURRENT MEDICATIONS: 1. Protonix 40 mg q.d. 2. Dicyclomine p.r.n. 3. Allegra p.r.n.  ALLERGIES:  PENICILLIN, SULFA, and METRONIDAZOLE.  SOCIAL HISTORY, FAMILY HISTORY, AND REVIEW OF SYSTEMS:  Please refer to dictated history and physical.  PHYSICAL EXAMINATION:  GENERAL:  Patient is alert and oriented x 3, although quite pale and visibly in moderate discomfort.  VITAL SIGNS:  Currently stable with a temperature of 99.2, pulse 100, blood pressure 140/62, respirations 20.  HEENT:  She is anicteric and has no conjunctival pallor.  Oropharynx is significantly dry although there are no lesions or erythema noted.  NECK:  Without cervical or supraclavicular lymphadenopathy and there is no thyromegaly present.  LUNGS:  Clear to auscultation bilaterally.  HEART:  Regular rate and rhythm without murmurs, rubs, or gallops.  ABDOMEN:  Soft yet moderately tender to palpation particularly in the right lower quadrant.  There are currently normal active  bowel sounds, no guarding or masses, and no succussion splash.  There is also no hepatosplenomegaly.  RECTAL:  Exam reveals a moderate perianal erythema, an empty ampulla, and heme-negative mucoid residue.  EXTREMITIES:  Reveal 1 to 2+ pitting pretibial edema, no cyanosis, and 2+ pulses bilaterally.  LABORATORY DATA:  Currently pending.  ASSESSMENT AND  PLAN:  Recurring diarrhea and severe abdominal pain, current etiology unknown in a patient who is unable to tolerate p.o. intake and who is showing signs of dehydration.  Patient will be admitted today for supportive care, IV fluids, and further stool studies.  Please refer to admitting orders.DD:  01/30/00 TD:  01/31/00 Job: [id number]

## 2011-02-20 NOTE — Procedures (Signed)
[place]. [facility]  Patient:    Kristie Morris, Kristie Morris                        MRN: [id number] Proc. Date: 12/08/99 Adm. Date:  [id number] Attending:  Ernie Avena                           Procedure Report  PROCEDURE PERFORMED:  Colonoscopy.  ENDOSCOPIST:  Cleotis Nipper, M.D.  PROCEDURE PERFORMED:  Colonoscopy.  INDICATIONS FOR PROCEDURE:  The patient is a 77 year old for follow-up of previous adenomatous polyp.  FINDINGS:  Normal exam to the cecum.  DESCRIPTION OF PROCEDURE:  The nature, purpose and risks of the procedure were familiar to the patient from prior examination and she provided written consent. Sedation was fentanyl 100 mcg and Versed 10 mg IV prior to and during this procedure and the upper endoscopy which had preceeded it.  During this exam, there was no problem with arrhythmias or desaturation, although there was some mild desaturation earlier at the time of her endoscopy (see separate report).  The Olympus pediatric video colonoscope was advanced with mild difficulty through a somewhat angulated sigmoid region, and then turning the patient into the supine  position and applying some external abdominal compression to get the tip to enter the cecum.  Pullback was then performed.  The quality of the prep was very good and it is felt that all areas were well seen.  This was a normal examination.  I did not see any evidence of polyps, cancer, colitis, vascular malformations or diverticular disease, although it is quite possible there was some degree of diverticulosis in the sigmoid region where there was also quite a bit of muscular thickening.  Retroflexion in the rectum was unremarkable.  The patient tolerated the procedure well and there were no apparent complications.  No biopsies were obtained.  IMPRESSION:  Essentially normal colonoscopy in a patient with a prior history of colonic adenomas.  PLAN: The  patient would next be due for surveillance colonoscopy at age 89 and his might be reasonable depending on her overall clinical status at that time, so I  will probably put her on our follow-up list for that purpose. DD:  12/08/99 TD:  12/09/99 Job: [id number]

## 2011-02-20 NOTE — Discharge Summary (Signed)
[place]. [facility]  Patient:    Kristie Morris, Kristie Morris                        MRN: [id number] Adm. Date:  [id number] Disc. Date: [id number] Attending:  Ernie Avena Dictator:   Evalee Jefferson, P.A.                           Discharge Summary  REASON FOR ADMISSION:  Diarrhea, abdominal pain and dehydration.  FINAL DIAGNOSIS:  Gastroenteritis, presumably infectious.  CONSULTATIONS:  None.  COMPLICATIONS:  None.  PROCEDURES:  Flexible sigmoidoscopy was performed on the evening of the patients admission on January 27, 2000.  It was revealing for a patchy hemorrhagic pancolitis along with an exudative colitis noted in the sigmoid region.  Additionally, there was some evidence of sigmoid diverticulitis. Biopsies were obtained and are pending at this time.  LABORATORY DATA:  Admission CBC obtained on January 27, 2000 revealed a white blood cell count of 12.9, which was significant for 85% neutrophils. Hemoglobin 13.0, hematocrit 38.5 and platelet count 349,000.  CMET obtained on January 27, 2000 revealed sodium 142, potassium 3.7, chloride 103, CO2 26, BUN 8, creatinine 0.6 and glucose 111.  Her albumin was 3.9 and her LFT were essentially normal with AST 19, ALT 11 and alkaline phosphatase slightly elevated at 125 and a normal total bilirubin 0.4.  Stool cultures obtained on the date of admission were negative for C. difficile toxin, also negative for ova and parasites and negative for stool white blood cells.  Stool cultures are pending at this time but, so far, no growth has been seen.  HOSPITAL COURSE:  The patient was admitted on the evening of January 27, 2000 due to a 48-hour history of severe diarrhea and abdominal pain.  She underwent flexible sigmoidoscopy that evening as mentioned above, revealing significant hemorrhagic pancolitis along with sigmoid exudative colitis, as well.  She was given supportive care including IV fluids and, also,  controlled symptomatically with Demerol and Phenergan.  By the morning of January 28, 2000, her diarrhea had slowed considerably, although she had multiple bowel movement her first night of admission.  She was maintained on a clear liquid diet and, as the day of January 28, 2000 progressed, her nausea and emesis resolved.  By that evening, she was much more comfortable.  Of note, the patient was placed on an empiric course of vancomycin for presumed C. difficile infection, given that she had a recent course of Avelox at home for a sinus infection and has a significant history of C. difficile infections in the past.  By the morning of January 29, 2000, the patient was feeling significantly better, as she reported that she was hungry and ready to try eating more solid foods.  Additionally, her abdominal was still present, but very tolerable, and this was notably off of any pain medications.  She also had experienced no nausea or emesis and she has had one bowel movement over the past 12 hours, occurring the previous evening, which was actually rather firm to constipated by the patients report.  Her hemoglobin on the morning of January 29, 2000 was 11.6 and her C. difficile toxin came back negative along with negative white blood cell count in her stools.  Cultures of stool are still pending at this time.  PLAN:  Increase her diet to solid foods for breakfast.  If she tolerates this well,  she will be discharged to home later this morning.  As her C. difficile came back negative, her vancomycin will not be continued after her discharge.  DISPOSITION:  The patient is to be discharged to home, resuming all home medications at that time.  She is sent home on no additional medications or antibiotics.  Her activity and diet at home will be without restrictions and as tolerated by the patient.  She has been scheduled for an office follow=up with me for Tuesday, May 8 at 2:00 p.m.  DISCHARGE MEDICATIONS:   Resume all home medications.  CONDITION ON DISCHARGE:  Improved. DD:  01/29/00 TD:  01/29/00 Job: [id number]

## 2011-02-20 NOTE — Op Note (Signed)
[facility]  Patient:    Kristie Morris, Kristie Morris Visit Number: [id number] MRN: [id number]          Service Type: END Location: ENDO Attending Physician:  Ernie Avena Dictated by:   Cleotis Nipper, M.D. Proc. Date: 09/26/01 Admit Date:  09/26/2001                             Operative Report  PROCEDURE:  Flexible sigmoidoscopy.  INDICATION:  This is a 77 year old with prior history of colitis and ongoing diarrhea on an intermittent sporadic basis.  FINDINGS:  Formed stool present, grossly normal to about 25 cm.  DESCRIPTION OF PROCEDURE:  The nature, purpose, and risks of the procedure were familiar to the patient from prior examination and she provided written consent. No additional sedation was administered for this procedure which was done immediately following her upper endoscopy with Savary dilatation.  The Olympus adjustable tension pediatric video colonoscope was advanced to about 20 or 25 cm whereupon further advancement was inhibited by the presence of formed stool as well as quite a bit of spasm and diverticular disease.  No polyps, cancer, loss of vascularity, granularity or exudate, or significant erythema were observed up to the limit of the exam which was finally terminated due to difficulty cleansing the colon intraprocedurally.  No biopsies were obtained. The patient tolerated the procedure well and there were no apparent complications.  IMPRESSION:  Normal limited sigmoidoscopic evaluation with formed stool suggesting the absence of significant diarrhea today.  PLAN:  Clinical followup of diarrheal symptoms.Dictated by:   Cleotis Nipper, M.D. Attending Physician:  Ernie Avena DD:  09/26/01 TD:  09/28/01 Job: [id number]

## 2012-04-15 ENCOUNTER — Other Ambulatory Visit (HOSPITAL_COMMUNITY): Payer: Self-pay | Admitting: Adult Health

## 2012-04-15 DIAGNOSIS — R109 Unspecified abdominal pain: Secondary | ICD-10-CM

## 2012-05-24 ENCOUNTER — Ambulatory Visit (HOSPITAL_COMMUNITY)
Admission: RE | Admit: 2012-05-24 | Discharge: 2012-05-24 | Disposition: A | Discharge status: Home / Self Care | Payer: Medicare Other | Source: Ambulatory Visit | Attending: Adult Health | Admitting: Adult Health

## 2012-05-24 DIAGNOSIS — R300 Dysuria: Secondary | ICD-10-CM | POA: Insufficient documentation

## 2012-05-24 DIAGNOSIS — K5730 Diverticulosis of large intestine without perforation or abscess without bleeding: Secondary | ICD-10-CM | POA: Insufficient documentation

## 2012-05-24 DIAGNOSIS — J90 Pleural effusion, not elsewhere classified: Secondary | ICD-10-CM | POA: Insufficient documentation

## 2012-05-24 DIAGNOSIS — K429 Umbilical hernia without obstruction or gangrene: Secondary | ICD-10-CM | POA: Insufficient documentation

## 2012-05-24 DIAGNOSIS — R109 Unspecified abdominal pain: Secondary | ICD-10-CM

## 2012-05-24 DIAGNOSIS — R350 Frequency of micturition: Secondary | ICD-10-CM | POA: Insufficient documentation

## 2012-05-24 DIAGNOSIS — Z853 Personal history of malignant neoplasm of breast: Secondary | ICD-10-CM | POA: Insufficient documentation

## 2012-05-24 MED ORDER — IOHEXOL 300 MG/ML  SOLN
100.0000 mL | Freq: Once | INTRAMUSCULAR | Status: AC | PRN
  Administered 2012-05-24: 100 mL via INTRAVENOUS

## 2012-06-03 ENCOUNTER — Ambulatory Visit (INDEPENDENT_AMBULATORY_CARE_PROVIDER_SITE_OTHER): Payer: Medicare Other | Admitting: Urology

## 2012-06-03 DIAGNOSIS — N393 Stress incontinence (female) (male): Secondary | ICD-10-CM

## 2012-06-03 DIAGNOSIS — R351 Nocturia: Secondary | ICD-10-CM

## 2012-06-03 DIAGNOSIS — R1032 Left lower quadrant pain: Secondary | ICD-10-CM

## 2013-02-17 ENCOUNTER — Other Ambulatory Visit: Payer: Self-pay | Admitting: Gastroenterology

## 2013-02-17 DIAGNOSIS — R1013 Epigastric pain: Secondary | ICD-10-CM

## 2013-02-22 ENCOUNTER — Other Ambulatory Visit: Payer: Medicare Other

## 2013-02-23 ENCOUNTER — Ambulatory Visit
Admission: RE | Admit: 2013-02-23 | Discharge: 2013-02-23 | Disposition: A | Discharge status: Home / Self Care | Payer: Medicare Other | Source: Ambulatory Visit | Attending: Gastroenterology | Admitting: Gastroenterology

## 2013-02-23 DIAGNOSIS — R1013 Epigastric pain: Secondary | ICD-10-CM

## 2013-11-29 ENCOUNTER — Ambulatory Visit: Payer: Self-pay | Admitting: Ophthalmology

## 2014-01-31 ENCOUNTER — Encounter (HOSPITAL_COMMUNITY): Payer: Self-pay | Admitting: Emergency Medicine

## 2014-01-31 ENCOUNTER — Emergency Department (HOSPITAL_COMMUNITY): Payer: Medicare HMO

## 2014-01-31 ENCOUNTER — Inpatient Hospital Stay (HOSPITAL_COMMUNITY)
Admission: EM | Admit: 2014-01-31 | Discharge: 2014-02-02 | DRG: 304 | Disposition: A | Discharge status: Home / Self Care | Payer: Medicare HMO | Attending: Family Medicine | Admitting: Family Medicine

## 2014-01-31 DIAGNOSIS — E785 Hyperlipidemia, unspecified: Secondary | ICD-10-CM

## 2014-01-31 DIAGNOSIS — R7989 Other specified abnormal findings of blood chemistry: Secondary | ICD-10-CM

## 2014-01-31 DIAGNOSIS — I509 Heart failure, unspecified: Secondary | ICD-10-CM | POA: Diagnosis present

## 2014-01-31 DIAGNOSIS — Z9010 Acquired absence of unspecified breast and nipple: Secondary | ICD-10-CM

## 2014-01-31 DIAGNOSIS — M810 Age-related osteoporosis without current pathological fracture: Secondary | ICD-10-CM | POA: Diagnosis present

## 2014-01-31 DIAGNOSIS — J4489 Other specified chronic obstructive pulmonary disease: Secondary | ICD-10-CM | POA: Diagnosis present

## 2014-01-31 DIAGNOSIS — I160 Hypertensive urgency: Secondary | ICD-10-CM

## 2014-01-31 DIAGNOSIS — I5189 Other ill-defined heart diseases: Secondary | ICD-10-CM

## 2014-01-31 DIAGNOSIS — R0600 Dyspnea, unspecified: Secondary | ICD-10-CM | POA: Diagnosis present

## 2014-01-31 DIAGNOSIS — E7800 Pure hypercholesterolemia, unspecified: Secondary | ICD-10-CM | POA: Diagnosis present

## 2014-01-31 DIAGNOSIS — R519 Headache, unspecified: Secondary | ICD-10-CM

## 2014-01-31 DIAGNOSIS — I519 Heart disease, unspecified: Secondary | ICD-10-CM

## 2014-01-31 DIAGNOSIS — D649 Anemia, unspecified: Secondary | ICD-10-CM | POA: Diagnosis present

## 2014-01-31 DIAGNOSIS — J45909 Unspecified asthma, uncomplicated: Secondary | ICD-10-CM

## 2014-01-31 DIAGNOSIS — I5031 Acute diastolic (congestive) heart failure: Secondary | ICD-10-CM | POA: Diagnosis present

## 2014-01-31 DIAGNOSIS — Z8249 Family history of ischemic heart disease and other diseases of the circulatory system: Secondary | ICD-10-CM

## 2014-01-31 DIAGNOSIS — K219 Gastro-esophageal reflux disease without esophagitis: Secondary | ICD-10-CM | POA: Diagnosis present

## 2014-01-31 DIAGNOSIS — K589 Irritable bowel syndrome without diarrhea: Secondary | ICD-10-CM | POA: Diagnosis present

## 2014-01-31 DIAGNOSIS — D72829 Elevated white blood cell count, unspecified: Secondary | ICD-10-CM | POA: Diagnosis present

## 2014-01-31 DIAGNOSIS — E876 Hypokalemia: Secondary | ICD-10-CM | POA: Diagnosis present

## 2014-01-31 DIAGNOSIS — R51 Headache: Secondary | ICD-10-CM

## 2014-01-31 DIAGNOSIS — Z853 Personal history of malignant neoplasm of breast: Secondary | ICD-10-CM

## 2014-01-31 DIAGNOSIS — J449 Chronic obstructive pulmonary disease, unspecified: Secondary | ICD-10-CM

## 2014-01-31 DIAGNOSIS — Z6841 Body Mass Index (BMI) 40.0 and over, adult: Secondary | ICD-10-CM

## 2014-01-31 DIAGNOSIS — R0601 Orthopnea: Secondary | ICD-10-CM

## 2014-01-31 DIAGNOSIS — I10 Essential (primary) hypertension: Principal | ICD-10-CM | POA: Diagnosis present

## 2014-01-31 HISTORY — DX: Unspecified asthma, uncomplicated: J45.909

## 2014-01-31 HISTORY — DX: Chronic obstructive pulmonary disease, unspecified: J44.9

## 2014-01-31 HISTORY — DX: Atherosclerotic heart disease of native coronary artery without angina pectoris: I25.10

## 2014-01-31 HISTORY — DX: Essential (primary) hypertension: I10

## 2014-01-31 LAB — CBC
HCT: 36.2 % (ref 36.0–46.0)
Hemoglobin: 11.3 g/dL — ABNORMAL LOW (ref 12.0–15.0)
MCH: 27.1 pg (ref 26.0–34.0)
MCHC: 31.2 g/dL (ref 30.0–36.0)
MCV: 86.8 fL (ref 78.0–100.0)
Platelets: 355 K/uL (ref 150–400)
RBC: 4.17 MIL/uL (ref 3.87–5.11)
RDW: 15.5 % (ref 11.5–15.5)
WBC: 14 K/uL — ABNORMAL HIGH (ref 4.0–10.5)

## 2014-01-31 LAB — BASIC METABOLIC PANEL WITH GFR
BUN: 19 mg/dL (ref 6–23)
CO2: 29 meq/L (ref 19–32)
Calcium: 9.1 mg/dL (ref 8.4–10.5)
Chloride: 104 meq/L (ref 96–112)
Creatinine, Ser: 0.92 mg/dL (ref 0.50–1.10)
GFR calc Af Amer: 63 mL/min — ABNORMAL LOW (ref >90)
GFR calc non Af Amer: 54 mL/min — ABNORMAL LOW (ref >90)
Glucose, Bld: 128 mg/dL — ABNORMAL HIGH (ref 70–99)
Potassium: 3.8 meq/L (ref 3.7–5.3)
Sodium: 147 meq/L (ref 137–147)

## 2014-01-31 LAB — TROPONIN I

## 2014-01-31 LAB — I-STAT CG4 LACTIC ACID, ED: LACTIC ACID, VENOUS: 1.24 mmol/L (ref 0.5–2.2)

## 2014-01-31 LAB — PHOSPHORUS: Phosphorus: 3.6 mg/dL (ref 2.3–4.6)

## 2014-01-31 LAB — MAGNESIUM: MAGNESIUM: 1.9 mg/dL (ref 1.5–2.5)

## 2014-01-31 LAB — PRO B NATRIURETIC PEPTIDE: Pro B Natriuretic peptide (BNP): 1055 pg/mL — ABNORMAL HIGH (ref 0–450)

## 2014-01-31 MED ORDER — IPRATROPIUM BROMIDE 0.02 % IN SOLN
2.5000 mL | Freq: Four times a day (QID) | RESPIRATORY_TRACT | Status: DC
  Administered 2014-01-31: 0.5 mg via RESPIRATORY_TRACT
  Filled 2014-01-31: qty 2.5

## 2014-01-31 MED ORDER — SIMVASTATIN 10 MG PO TABS: 5.0000 mg | ORAL_TABLET | Freq: Every day | ORAL | Status: DC

## 2014-01-31 MED ORDER — DICYCLOMINE HCL 20 MG PO TABS
20.0000 mg | ORAL_TABLET | Freq: Every day | ORAL | Status: DC
  Filled 2014-01-31 (×2): qty 1

## 2014-01-31 MED ORDER — HYDRALAZINE HCL 20 MG/ML IJ SOLN
10.0000 mg | Freq: Four times a day (QID) | INTRAMUSCULAR | Status: DC | PRN
  Administered 2014-02-01: 10 mg via INTRAVENOUS
  Filled 2014-01-31: qty 1

## 2014-01-31 MED ORDER — NITROGLYCERIN 2 % TD OINT
1.0000 [in_us] | TOPICAL_OINTMENT | Freq: Once | TRANSDERMAL | Status: AC
  Administered 2014-01-31: 1 [in_us] via TOPICAL
  Filled 2014-01-31: qty 1

## 2014-01-31 MED ORDER — ENOXAPARIN SODIUM 40 MG/0.4ML ~~LOC~~ SOLN
40.0000 mg | SUBCUTANEOUS | Status: DC
  Administered 2014-02-01 – 2014-02-02 (×2): 40 mg via SUBCUTANEOUS
  Filled 2014-01-31 (×2): qty 0.4

## 2014-01-31 MED ORDER — IPRATROPIUM BROMIDE 0.02 % IN SOLN
2.5000 mL | Freq: Three times a day (TID) | RESPIRATORY_TRACT | Status: DC
  Administered 2014-02-01 – 2014-02-02 (×4): 0.5 mg via RESPIRATORY_TRACT
  Filled 2014-01-31 (×4): qty 2.5

## 2014-01-31 MED ORDER — VITAMIN B-12 2500 MCG SL SUBL: 1.0000 | SUBLINGUAL_TABLET | Freq: Every morning | SUBLINGUAL | Status: DC

## 2014-01-31 MED ORDER — FUROSEMIDE 10 MG/ML IJ SOLN
20.0000 mg | Freq: Two times a day (BID) | INTRAMUSCULAR | Status: DC
  Administered 2014-02-01: 20 mg via INTRAVENOUS
  Filled 2014-01-31: qty 2

## 2014-01-31 MED ORDER — SODIUM CHLORIDE 0.9 % IV SOLN: 250.0000 mL | INTRAVENOUS | Status: DC | PRN

## 2014-01-31 MED ORDER — ALBUTEROL SULFATE (2.5 MG/3ML) 0.083% IN NEBU
3.0000 mL | INHALATION_SOLUTION | Freq: Four times a day (QID) | RESPIRATORY_TRACT | Status: DC | PRN
  Filled 2014-01-31: qty 3

## 2014-01-31 MED ORDER — VITAMIN E 180 MG (400 UNIT) PO CAPS
400.0000 [IU] | ORAL_CAPSULE | Freq: Every morning | ORAL | Status: DC
  Administered 2014-02-01 – 2014-02-02 (×2): 400 [IU] via ORAL
  Filled 2014-01-31 (×5): qty 1

## 2014-01-31 MED ORDER — ASPIRIN EC 81 MG PO TBEC
81.0000 mg | DELAYED_RELEASE_TABLET | Freq: Every day | ORAL | Status: DC
  Administered 2014-02-01 – 2014-02-02 (×2): 81 mg via ORAL
  Filled 2014-01-31 (×2): qty 1

## 2014-01-31 MED ORDER — MOMETASONE FURO-FORMOTEROL FUM 100-5 MCG/ACT IN AERO
2.0000 | INHALATION_SPRAY | Freq: Two times a day (BID) | RESPIRATORY_TRACT | Status: DC
  Administered 2014-02-01 – 2014-02-02 (×3): 2 via RESPIRATORY_TRACT
  Filled 2014-01-31 (×29): qty 8.8

## 2014-01-31 MED ORDER — SODIUM CHLORIDE 0.9 % IJ SOLN: 3.0000 mL | INTRAMUSCULAR | Status: DC | PRN

## 2014-01-31 MED ORDER — SODIUM CHLORIDE 0.9 % IJ SOLN
3.0000 mL | Freq: Two times a day (BID) | INTRAMUSCULAR | Status: DC
  Administered 2014-01-31 – 2014-02-02 (×4): 3 mL via INTRAVENOUS

## 2014-01-31 MED ORDER — PANTOPRAZOLE SODIUM 40 MG PO TBEC
40.0000 mg | DELAYED_RELEASE_TABLET | Freq: Every day | ORAL | Status: DC
  Administered 2014-02-01 – 2014-02-02 (×2): 40 mg via ORAL
  Filled 2014-01-31 (×2): qty 1

## 2014-01-31 MED ORDER — VITAMIN D 1000 UNITS PO TABS
1000.0000 [IU] | ORAL_TABLET | Freq: Every morning | ORAL | Status: DC
Start: 2014-02-01 — End: 2014-02-02
  Administered 2014-02-01 – 2014-02-02 (×2): 1000 [IU] via ORAL
  Filled 2014-01-31 (×2): qty 1

## 2014-01-31 MED ORDER — LABETALOL HCL 5 MG/ML IV SOLN
10.0000 mg | Freq: Once | INTRAVENOUS | Status: AC
  Administered 2014-01-31: 10 mg via INTRAVENOUS
  Filled 2014-01-31: qty 4

## 2014-01-31 NOTE — ED Notes (Signed)
Sob x 2 wks - worsening central cp x 5  Days, had echo at that time.  Denies n/v.  C/o lightheadedness.  Speaking clear full sentences in triage.  Sent by PCP.

## 2014-01-31 NOTE — H&P (Signed)
[facility] History and Physical  Kristie Morris [id number] DOB: May 07, 1925 DOA: 01/31/2014  Referring physician:  PCP: [name], MD  Specialists:   Chief Complaint: Shortness of breath  HPI: Kristie Morris is a 80 y.o. female  With a history of hypertension, asthma, seasonal allergies, that presents to the emergency department for shortness of breath. Patient states over the past 2 weeks she hass been having increasing shortness of breath however it worsened today. Patient states she usually sleeps on one to 2 pillows.  Patient stated that over the past couple of weeks her blood pressure medications have been changed quite a few times. Patient was taking triamterene for approximately 15 years, however this was changed just 2 weeks ago to lisinopril, as her primary care physician was concerned about her kidney function. Once on lisinopril, patient developed coughing as well as dizziness and weakness, she was then switched to losartan. Patient states she takes her medications as prescribed. She states she's had some weight gain in the last couple of weeks. Patient states she had an echocardiogram conducted approximately one week ago, however was not told the results.  She states her blood pressure has been low to normal for the past several years however has been elevated since changing her blood pressure medication. At this time, patient denies any chest pain, headache, dizziness, abdominal pain. Patient states her shortness of breath has improved.  Review of Systems:  Constitutional: Denies fever, chills, diaphoresis, appetite change and fatigue.  HEENT: Denies photophobia, eye pain, redness, hearing loss, ear pain, congestion, sore throat, rhinorrhea, sneezing, mouth sores, trouble swallowing, neck pain, neck stiffness and tinnitus.   Respiratory: Complains of shortness of breath and cough. Cardiovascular: Patient had some chest pain upon arrival to the emergency department,  however this has resolved. Complains of some leg swelling. Gastrointestinal: Denies nausea, vomiting, abdominal pain, diarrhea, constipation, blood in stool and abdominal distention.  Genitourinary: Denies dysuria, urgency, frequency, hematuria, flank pain and difficulty urinating.  Musculoskeletal: Denies myalgias, back pain, joint swelling, arthralgias and gait problem.  Skin: Denies pallor, rash and wound.  Neurological: Denies dizziness, seizures, syncope, weakness, light-headedness, numbness and headaches.  Hematological: Denies adenopathy. Easy bruising, personal or family bleeding history  Psychiatric/Behavioral: Denies suicidal ideation, mood changes, confusion, nervousness, sleep disturbance and agitation  Past Medical History  Diagnosis Date  . Hypertension   . Asthma   . Crohn disease   . Cancer     breast   Past Surgical History  Procedure Laterality Date  . Mastectomy    . Back surgery    . Knee surgery    . Abdominal hysterectomy     Social History:  reports that she has never smoked. She does not have any smokeless tobacco history on file. She reports that she does not drink alcohol or use illicit drugs. Lives at home alone. Has good family support. Recently widowed, 3 years ago.  Allergies  Allergen Reactions  . Zithromax [Azithromycin] Diarrhea  . Celebrex [Celecoxib] Hives  . Ciprofloxacin Hives  . Penicillins Hives  . Polymyxin B Hives  . Sulfa Antibiotics Hives  . Vancomycin Hives  . Vioxx [Rofecoxib] Hives    No family history on file.   Prior to Admission medications   Medication Sig Start Date End Date Taking? Authorizing Provider  aspirin EC 81 MG tablet Take 81 mg by mouth daily.   Yes Historical Provider, MD  ATROVENT HFA 17 MCG/ACT inhaler Inhale 1 puff into the lungs 4 (four) times daily.  01/29/14  Yes Historical Provider, MD  cholecalciferol (VITAMIN D) 1000 UNITS tablet Take 1,000 Units by mouth every morning.   Yes Historical Provider, MD    Cyanocobalamin (VITAMIN B-12) 2500 MCG SUBL Place 1 tablet under the tongue every morning.   Yes Historical Provider, MD  dicyclomine (BENTYL) 20 MG tablet Take 20 mg by mouth daily.   Yes Historical Provider, MD  Fluticasone-Salmeterol (ADVAIR) 250-50 MCG/DOSE AEPB Inhale 1 puff into the lungs 2 (two) times daily.   Yes Historical Provider, MD  ibuprofen (ADVIL,MOTRIN) 200 MG tablet Take 400 mg by mouth every 6 (six) hours as needed for headache.   Yes Historical Provider, MD  losartan (COZAAR) 50 MG tablet Take 50 mg by mouth daily. 01/26/14  Yes Historical Provider, MD  Multiple Vitamins-Minerals (EYE VITAMINS PO) Take 1 tablet by mouth every morning.   Yes Historical Provider, MD  Naproxen Sodium 220 MG CAPS Take 440 mg by mouth 2 (two) times daily.   Yes Historical Provider, MD  omeprazole (PRILOSEC) 20 MG capsule Take 20 mg by mouth daily.   Yes Historical Provider, MD  pravastatin (PRAVACHOL) 10 MG tablet Take 10 mg by mouth at bedtime. 01/17/14  Yes Historical Provider, MD  predniSONE (STERAPRED UNI-PAK) 10 MG tablet Take by mouth See admin instructions. Take as directed per 6 day package instructions (6,5,4,3,2,1). Course started on 01/26/14 and ended 01/31/2014 01/26/14  Yes Historical Provider, MD  PROAIR HFA 108 (90 BASE) MCG/ACT inhaler Inhale 1-2 puffs into the lungs every 6 (six) hours as needed. For shortness of breath wheezing 01/26/14  Yes Historical Provider, MD  vitamin E 400 UNIT capsule Take 400 Units by mouth every morning.   Yes Historical Provider, MD   Physical Exam: Filed Vitals:   01/31/14 2115  BP: 184/83  Pulse: 67  Temp:   Resp: 25     General: Well developed, well nourished, NAD, appears stated age  HEENT: NCAT, PERRLA, EOMI, Anicteic Sclera, mucous membranes moist.   Neck: Supple, no JVD, no masses  Cardiovascular: S1 S2 auscultated, 1/6 SEM. Regular rate and rhythm.  Respiratory: Clear to auscultation bilaterally with equal chest rise  Abdomen: Soft,  obese, nontender, nondistended, + bowel sounds  Extremities: warm dry without cyanosis clubbing, trace lower extremity edema  Neuro: AAOx3, cranial nerves grossly intact. Strength 5/5 in patient's upper and lower extremities bilaterally  Skin: Without rashes exudates or nodules  Psych: Normal affect and demeanor with intact judgement and insight  Labs on Admission:  Basic Metabolic Panel:  Recent Labs Lab 01/31/14 1930  NA 147  K 3.8  CL 104  CO2 29  GLUCOSE 128*  BUN 19  CREATININE 0.92  CALCIUM 9.1   Liver Function Tests: No results found for this basename: AST, ALT, ALKPHOS, BILITOT, PROT, ALBUMIN,  in the last 168 hours No results found for this basename: LIPASE, AMYLASE,  in the last 168 hours No results found for this basename: AMMONIA,  in the last 168 hours CBC:  Recent Labs Lab 01/31/14 1930  WBC 14.0*  HGB 11.3*  HCT 36.2  MCV 86.8  PLT 355   Cardiac Enzymes:  Recent Labs Lab 01/31/14 1930  TROPONINI <0.30    BNP (last 3 results)  Recent Labs  01/31/14 1930  PROBNP 1055.0*   CBG: No results found for this basename: GLUCAP,  in the last 168 hours  Radiological Exams on Admission: Dg Chest 2 View  01/31/2014   CLINICAL DATA:  Shortness of breath.  EXAM: CHEST  2 VIEW  COMPARISON:  None.  FINDINGS: Mild cardiomegaly is noted. Surgical clips are noted in both axillary regions. No pneumothorax or pleural effusion is noted. No acute pulmonary disease is noted. Bony thorax intact.  IMPRESSION: No acute cardiopulmonary abnormality seen.   Electronically Signed   By: Sabino Dick M.D.   On: 01/31/2014 20:49    EKG: Independently reviewed. Sinus rhythm, rate 88, PA-C  Assessment/Plan  Hypertensive urgency -Patient will be admitted to telemetry unit. -At this time will hold her losartan, as she has been coughing secondary to her losartan. -Patient's medications have been changed a few times in the past few weeks, she was on triamterene for  approximately 15 years, switched to lisinopril, however had cough as well as dizziness secondary to this, was then switched losartan. -Will place patient on hydralazine 10 mg every 6 hours as needed -Will place patient on Lasix 20 mg IV twice a day  Dyspnea -Secondary to multifactorial causes: Hypertensive urgency versus CHF -Will continue Lasix -Chest x-ray was negative for acute cardiopulmonary process -Will continue patient's home Advair as well as albuterol. -Will place on supplemental oxygen to maintain saturations above 92% -Echocardiogram was conducted weeks ago as an outpatient, we do not have the results of this, please contact as well family [place] consult cardiology  Possible newly diagnosed CHF -BNP elevated at 1055 -Again, echocardiogram conducted one week ago, please contact as the family [facility] for -Will place patient on Lasix 20 mg IV twice a day -Will monitor her BMP -Will place patient on fluid restriction with cardiac healthy diet -Will monitor daily weights, strict intake and output  Atypical Chest pain -Likely secondary to hypertensive urgency -Currently resolved -First troponin was negative -Will continue to cycle her troponins every 6 hours, obtain a fasting lipid panel, TSH, magnesium and phosphorus levels. Leukocytosis -Likely reactive -Will continue to monitor CBC -Patient currently afebrile, does not have a source of infection -Chest x-ray shows no cardiopulmonary process, patient denies any urinary symptoms  Irritable Bowel syndrome -Continue Bentyl  GERD -Continue PPI  Hyperlipidemia -Continue statin -Will obtain fasting lipid panel  History of asthma -Continue home inhalers including Advair, Proair, and Atrovent  DVT prophylaxis: Lovenox  Code Status: Full  Condition: Guarded  Family Communication: Family at bedside.  Admission, patients condition and plan of care including tests being ordered have been discussed  with the patient and Family who indicate understanding and agree with the plan and Code Status.  Disposition Plan: Admitted (expected LOS 1-2 days)  Time spent: 60 minutes  Markeita Alicia D.O. [facility] Pager [phone]  If 7PM-7AM, please contact night-coverage [email or web address] Password [facility] 01/31/2014, 9:43 PM

## 2014-01-31 NOTE — ED Provider Notes (Signed)
CSN: [id number]     Arrival date & time 01/31/14  1839 History   First MD Initiated Contact with Patient 01/31/14 1906     Chief Complaint  Patient presents with  . Shortness of Breath     (Consider location/radiation/quality/duration/timing/severity/associated sxs/prior Treatment) HPI Patient presents with concern of dyspnea, chest pain. Patient states that the chest pain is minimal, anterior, sore. More concerning to the patient has ongoing dyspnea, worse over the past days, but present for 2 weeks. Trimethoprim the patient has had weight gain, swelling in her lower extremities, cough. Patient had echocardiogram, prior physician evaluation of this illness. Today she had followup appointment, was sent here for evaluation. She denies fevers, chills, vomiting, diarrhea. Patient states that previously she had well-controlled blood pressure, but over the past 2 weeks has had hypertension persistently.  Past Medical History  Diagnosis Date  . Hypertension   . Asthma   . Crohn disease   . Cancer     breast   Past Surgical History  Procedure Laterality Date  . Mastectomy    . Back surgery    . Knee surgery    . Abdominal hysterectomy     No family history on file. History  Substance Use Topics  . Smoking status: Never Smoker   . Smokeless tobacco: Not on file  . Alcohol Use: No   OB History   Grav Para Term Preterm Abortions TAB SAB Ect Mult Living                 Review of Systems  Constitutional:       Per HPI, otherwise negative  HENT:       Per HPI, otherwise negative  Respiratory:       Per HPI, otherwise negative  Cardiovascular:       Per HPI, otherwise negative  Gastrointestinal: Negative for vomiting.  Endocrine:       Negative aside from HPI  Genitourinary:       Neg aside from HPI   Musculoskeletal:       Per HPI, otherwise negative  Skin: Negative.   Neurological: Negative for syncope.      Allergies  Celebrex; Ciprofloxacin; Penicillins;  Polymyxin b; Sulfa antibiotics; Vancomycin; Vioxx; and Zithromax  Home Medications   Prior to Admission medications   Not on File   BP 192/104  Pulse 95  Temp(Src) 98 F (36.7 C) (Oral)  Resp 16  Ht 5\' 4"  (D34-534 m)  Wt 235 lb (106.595 kg)  BMI 40.32 kg/m2  SpO2 92% Physical Exam  Nursing note and vitals reviewed. Constitutional: She is oriented to person, place, and time. She appears well-developed and well-nourished. No distress.  HENT:  Head: Normocephalic and atraumatic.  Eyes: Conjunctivae and EOM are normal.  Cardiovascular: Normal rate and regular rhythm.   Pulmonary/Chest: Effort normal. No stridor. No respiratory distress. She has decreased breath sounds. She has no wheezes.  Abdominal: She exhibits no distension.  Musculoskeletal: She exhibits no edema.  Bilateral lower extremity symmetric edema  Neurological: She is alert and oriented to person, place, and time. No cranial nerve deficit.  Skin: Skin is warm and dry.  Psychiatric: She has a normal mood and affect.    ED Course  Procedures (including critical care time) Labs Review Labs Reviewed  CBC - Abnormal; Notable for the following:    WBC 14.0 (*)    Hemoglobin 11.3 (*)    All other components within normal limits  BASIC METABOLIC PANEL - Abnormal; Notable  for the following:    Glucose, Bld 128 (*)    GFR calc non Af Amer 54 (*)    GFR calc Af Amer 63 (*)    All other components within normal limits  PRO B NATRIURETIC PEPTIDE - Abnormal; Notable for the following:    Pro B Natriuretic peptide (BNP) 1055.0 (*)    All other components within normal limits  TROPONIN I  I-STAT CG4 LACTIC ACID, ED   O2- 90%ra, abnormal   Imaging Review Dg Chest 2 View  01/31/2014   CLINICAL DATA:  Shortness of breath.  EXAM: CHEST  2 VIEW  COMPARISON:  None.  FINDINGS: Mild cardiomegaly is noted. Surgical clips are noted in both axillary regions. No pneumothorax or pleural effusion is noted. No acute pulmonary disease  is noted. Bony thorax intact.  IMPRESSION: No acute cardiopulmonary abnormality seen.   Electronically Signed   By: Sabino Dick M.D.   On: 01/31/2014 20:49    EKG has rate 88, sinus rhythm, premature atrial contraction, ST wave changes, no changes from prior, abnormal   Cardiac monitor 85, unremarkable  9:24 PM Blood pressure substantially improved, 170/70. Breathing is much easier.   MDM  This pleasant elderly female presents with worsening dyspnea, and minimal chest pain. On exam patient is awake, alert, but notably hypertensive. With nitroglycerin paste, labetalol, patient's blood pressure and breathing improved substantially. Patient's evaluation demonstrates elevated BNP, and given her age, absence of available prior evaluation, she was admitted for further evaluation and management.   Carmin Muskrat, MD 01/31/14 2125

## 2014-02-01 ENCOUNTER — Encounter (HOSPITAL_COMMUNITY): Payer: Self-pay | Admitting: Adult Health

## 2014-02-01 DIAGNOSIS — R0989 Other specified symptoms and signs involving the circulatory and respiratory systems: Secondary | ICD-10-CM

## 2014-02-01 DIAGNOSIS — R0601 Orthopnea: Secondary | ICD-10-CM

## 2014-02-01 DIAGNOSIS — R0609 Other forms of dyspnea: Secondary | ICD-10-CM

## 2014-02-01 DIAGNOSIS — R51 Headache: Secondary | ICD-10-CM

## 2014-02-01 DIAGNOSIS — I519 Heart disease, unspecified: Secondary | ICD-10-CM

## 2014-02-01 DIAGNOSIS — I10 Essential (primary) hypertension: Secondary | ICD-10-CM

## 2014-02-01 DIAGNOSIS — R799 Abnormal finding of blood chemistry, unspecified: Secondary | ICD-10-CM

## 2014-02-01 LAB — BASIC METABOLIC PANEL
BUN: 21 mg/dL (ref 6–23)
CALCIUM: 8.4 mg/dL (ref 8.4–10.5)
CO2: 30 mEq/L (ref 19–32)
Chloride: 106 mEq/L (ref 96–112)
Creatinine, Ser: 0.84 mg/dL (ref 0.50–1.10)
GFR calc non Af Amer: 61 mL/min — ABNORMAL LOW (ref >90)
GFR, EST AFRICAN AMERICAN: 70 mL/min — AB (ref >90)
Glucose, Bld: 93 mg/dL (ref 70–99)
POTASSIUM: 3.8 meq/L (ref 3.7–5.3)
SODIUM: 146 meq/L (ref 137–147)

## 2014-02-01 LAB — FOLATE: Folate: 11.1 ng/mL

## 2014-02-01 LAB — FERRITIN: FERRITIN: 56 ng/mL (ref 10–291)

## 2014-02-01 LAB — TSH: TSH: 1.08 u[IU]/mL (ref 0.350–4.500)

## 2014-02-01 LAB — CBC
HCT: 31.2 % — ABNORMAL LOW (ref 36.0–46.0)
Hemoglobin: 9.7 g/dL — ABNORMAL LOW (ref 12.0–15.0)
MCH: 26.9 pg (ref 26.0–34.0)
MCHC: 31.1 g/dL (ref 30.0–36.0)
MCV: 86.4 fL (ref 78.0–100.0)
PLATELETS: 340 10*3/uL (ref 150–400)
RBC: 3.61 MIL/uL — AB (ref 3.87–5.11)
RDW: 15.7 % — AB (ref 11.5–15.5)
WBC: 11.1 10*3/uL — ABNORMAL HIGH (ref 4.0–10.5)

## 2014-02-01 LAB — VITAMIN B12: Vitamin B-12: 777 pg/mL (ref 211–911)

## 2014-02-01 LAB — RETICULOCYTES
RBC.: 4.15 MIL/uL (ref 3.87–5.11)
RETIC COUNT ABSOLUTE: 87.2 10*3/uL (ref 19.0–186.0)
RETIC CT PCT: 2.1 % (ref 0.4–3.1)

## 2014-02-01 LAB — TROPONIN I
Troponin I: <0.3 ng/mL (ref <0.30)
Troponin I: <0.3 ng/mL (ref <0.30)

## 2014-02-01 MED ORDER — AMLODIPINE BESYLATE 5 MG PO TABS
5.0000 mg | ORAL_TABLET | Freq: Every day | ORAL | Status: DC
  Administered 2014-02-01 – 2014-02-02 (×2): 5 mg via ORAL
  Filled 2014-02-01 (×2): qty 1

## 2014-02-01 MED ORDER — ACETAMINOPHEN 500 MG PO TABS
500.0000 mg | ORAL_TABLET | Freq: Four times a day (QID) | ORAL | Status: DC | PRN
  Filled 2014-02-01: qty 1

## 2014-02-01 MED ORDER — FUROSEMIDE 10 MG/ML IJ SOLN
40.0000 mg | Freq: Once | INTRAMUSCULAR | Status: AC
  Administered 2014-02-01: 40 mg via INTRAVENOUS
  Filled 2014-02-01: qty 4

## 2014-02-01 MED ORDER — DICYCLOMINE HCL 10 MG PO CAPS
20.0000 mg | ORAL_CAPSULE | Freq: Every day | ORAL | Status: DC
  Administered 2014-02-01 – 2014-02-02 (×2): 20 mg via ORAL
  Filled 2014-02-01 (×2): qty 2

## 2014-02-01 MED ORDER — ISOSORBIDE MONONITRATE 20 MG PO TABS
10.0000 mg | ORAL_TABLET | Freq: Two times a day (BID) | ORAL | Status: DC
  Filled 2014-02-01: qty 1

## 2014-02-01 MED ORDER — VITAMIN B-12 1000 MCG PO TABS
2500.0000 ug | ORAL_TABLET | Freq: Every day | ORAL | Status: DC
  Administered 2014-02-01 – 2014-02-02 (×2): 2500 ug via ORAL
  Filled 2014-02-01 (×2): qty 3

## 2014-02-01 MED ORDER — LOSARTAN POTASSIUM 50 MG PO TABS
50.0000 mg | ORAL_TABLET | Freq: Every day | ORAL | Status: DC
  Administered 2014-02-01 – 2014-02-02 (×2): 50 mg via ORAL
  Filled 2014-02-01 (×2): qty 1

## 2014-02-01 NOTE — Consult Note (Signed)
CARDIOLOGY CONSULT NOTE   Patient ID: Kristie Morris MRN: [id number] DOB/AGE: [birth year] 80 y.o.  Admit Date: 01/31/2014 Referring Physician: PTH Primary Physician: [name], MD Consulting Cardiologist:Koneswaran, Jamesetta So MD Primary Cardiologist: Fransico Him Reason for Consultation: CHF, Chest Pain  Clinical Summary Kristie Morris is a 80 y.o.female with history of recurrent chest pain and shortness of breath, cardiac cath in 2002 negative for CAD, with normal EF per echo in 2002; hypertension, .asthma, GERD, and history of breast cancer s/p mastectomy.  Lost to cardiac follow up as she was free of CAD. She has been seen in the ER on occasion for non-cardiac chest pain.     She presented to the ER with increasing shortness of breath over 3 week period of time. Her symptoms began after a routine office appointment for annual physical on 01/17/2014, where she was seen by a physician assistant , Early Osmond reviewed her labs and discontinued her antihypertensive medication containing HCTZ. She was also placed on a statin medication. Shortly, hereafter she began have trouble with breathing along with wheezing and coughing. She attributed it to the pollen in the air and your work that was going on around her home.   She did have an echocardiogram ordered on 01/26/2014, which was read by a Dr.Joseph Guzzo from [facility]. . This demonstrated mild LVH, LVEF greater than 65%, no abnormal diastolic function. Right ventricular size and function are normal. Left atrium is of normal size the right atrium was normal size and the intra-atrial septum was normal. M.D. structure and function revealed mild mitral regurg, AV structure was normal, TB structure and function revealed mild tricuspid regurg. The pulmonic valve was structure normal without stenosis or regurgitation aortic structure revealed normal rugae pericardium was normal. Recommendations included LVH risk factor modification,  consider diastolic dysfunction, repeat echo in 3 years for MR.   She called their office and requested an inhalerbe called in. She did follow up with PA, and states that he changed her antihypertensive medication again. Her blood pressure rose, she gained 10 pounds, and she was having more and more difficult breathing along with PND and orthopnea. She states she could not walk to her mailbox without becoming very short of breath having to wait to return back to her front door which is approximately 20 feet. She returned back to her primary care physician's office and was directed to the emergency room due to her worsening symptoms.   On arrival to the emergency room blood pressure was elevated at 192/104, with HR of 94. Hemoglobin 11.3 hematocrit 36.2 with white blood cells of 14.0. Glucose was mildly elevated 148. Her BNP was 1,055, initial TpII l< 0.30. Chest x-ray was negative for pulmonary edema or CHF. She was given nitroglycerin 2 inch paste, and 10 mg of IV labetalol x1. She is since been placed on Lasix 20 mg IV twice a day with minimal diuresis currently. Her medications include losartan 50 mg daily for blood pressure control, which currently has not been restarted. Blood pressure is averaging in the 140s over 60s since admission.   Followup labs reveal a hemoglobin of 9.7 with hematocrit of 31.2. She continues mildly dyspneic but actually improved overall although she has not been walking much. She is not on oxygen. She denies frank chest pain. She states it hurts to take deep breaths, and feels that her" lungs are sore". No diaphoresis dizziness or nausea or vomiting.   Allergies  Allergen Reactions  . Zithromax [Azithromycin] Diarrhea  .  Celebrex [Celecoxib] Hives  . Ciprofloxacin Hives  . Penicillins Hives  . Polymyxin B Hives  . Sulfa Antibiotics Hives  . Vancomycin Hives  . Vioxx [Rofecoxib] Hives    Medications Scheduled Medications: . aspirin EC  81 mg Oral Daily  .  cholecalciferol  1,000 Units Oral q morning - 10a  . dicyclomine  20 mg Oral Daily  . enoxaparin (LOVENOX) injection  40 mg Subcutaneous Q24H  . furosemide  20 mg Intravenous BID  . ipratropium  2.5 mL Inhalation TID  . mometasone-formoterol  2 puff Inhalation BID  . pantoprazole  40 mg Oral Daily  . simvastatin  5 mg Oral q1800  . sodium chloride  3 mL Intravenous Q12H  . vitamin B-12  2,500 mcg Oral Daily  . vitamin E  400 Units Oral q morning - 10a       PRN Medications: sodium chloride, albuterol, hydrALAZINE, sodium chloride   Past Medical History  Diagnosis Date  . Hypertension   . Asthma   . Crohn disease   . Cancer     breast    Past Surgical History  Procedure Laterality Date  . Mastectomy    . Back surgery    . Knee surgery    . Abdominal hysterectomy      Family History  Problem Relation Age of Onset  . Heart attack Mother   . Heart attack Sister     Social History Ms. Kristie Morris reports that she has never smoked. She does not have any smokeless tobacco history on file. Ms. Kristie Morris reports that she does not drink alcohol.  Review of Systems Otherwise reviewed and negative except as outlined.  Physical Examination Blood pressure 148/66, pulse 70, temperature 98 F (36.7 C), temperature source Oral, resp. rate 20, height 5' (1.524 m), weight 222 lb 10.6 oz (101 kg), SpO2 96.00%. No intake or output data in the 24 hours ending 02/01/14 0808  Telemetry: NSR  GEN: No acute distress.  HEENT: Conjunctiva and lids normal, oropharynx clear with moist mucosa. Neck: Supple, no elevated JVP or carotid bruits, no thyromegaly. Lungs: Inspiratory wheezes, with some mild crackles in the bases. Cardiac: Regular rate and rhythm, no S3 or significant systolic murmur, no pericardial rub. Abdomen: Soft, nontender, no hepatomegaly, bowel sounds present, no guarding or rebound. Extremities: No pitting edema, distal pulses 2+. Multiple varicosities, Skin: Warm and  dry. Musculoskeletal: No kyphosis. Obese Neuropsychiatric: Alert and oriented x3, affect grossly appropriate.  Prior Cardiac Testing/Procedures  1.Cardiac Cath 2003 RESULTS: The left main coronary artery is widely patent and bifurcates into a  left anterior descending artery and left circumflex artery.  The left anterior descending artery has 20% proximal luminal irregularities  and gives rise to a first diagonal branch which has a proximal 30% narrowing.  The rest of the LAD is widely patent throughout its course.  The left circumflex is widely patent throughout its course and gives rise to  one large branching obtuse marginal branch which is widely patent.  The right coronary artery has a mid 50% narrowing before bifurcating into the  posterior descending artery and posterolateral artery both of which are widely  patent.  Left ventriculography performed in 30 degree RAO view using a total of 30 cc  of contrast at 13 cc per second shows normal LV function, no wall motion  abnormalities. Left ventricular pressure is 127/15 mmHg. Aortic pressure is  127/66 mmHg.  ASSESSMENT:  1. Noncardiac chest pain.  2. Nonobstructive coronary artery disease.  3. Normal left ventricular function.  Echocardiogram 01/2014 This demonstrated mild LVH, LVEF greater than 65%, no abnormal diastolic function. Right ventricular size and function are normal. Left atrium is of normal size the right atrium was normal size and the intra-atrial septum was normal. M.D. structure and function revealed mild mitral regurg, AV structure was normal, TB structure and function revealed mild tricuspid regurg. The pulmonic valve was structure normal without stenosis or regurgitation aortic structure revealed normal root.  Pericardium was normal. Recommendations included LVH risk factor modification, consider diastolic dysfunction, repeat echo in 3 years for MR.  Echocardiogram: 06/2003 - Overall left ventricular systolic  function was normal. Left ventricular ejection fraction was estimated , range being 55 % to 65 %. There were no left ventricular regional wall motion abnormalities. - There was mild to moderate mitral valvular regurgitation. - The left atrium was mildly dilated. ---------------------------------------------------------------  Lab Results  Basic Metabolic Panel:  Recent Labs Lab 01/31/14 1930 01/31/14 2311 02/01/14 0443  NA 147  --  146  K 3.8  --  3.8  CL 104  --  106  CO2 29  --  30  GLUCOSE 128*  --  93  BUN 19  --  21  CREATININE 0.92  --  0.84  CALCIUM 9.1  --  8.4  MG  --  1.9  --   PHOS  --  3.6  --     CBC:  Recent Labs Lab 01/31/14 1930 02/01/14 0443  WBC 14.0* 11.1*  HGB 11.3* 9.7*  HCT 36.2 31.2*  MCV 86.8 86.4  PLT 355 340    Cardiac Enzymes:  Recent Labs Lab 01/31/14 1930 01/31/14 2311 02/01/14 0443  TROPONINI <0.30 <0.30 <0.30    BNP:1055.0   Radiology: Dg Chest 2 View  01/31/2014   CLINICAL DATA:  Shortness of breath.  EXAM: CHEST  2 VIEW  COMPARISON:  None.  FINDINGS: Mild cardiomegaly is noted. Surgical clips are noted in both axillary regions. No pneumothorax or pleural effusion is noted. No acute pulmonary disease is noted. Bony thorax intact.  IMPRESSION: No acute cardiopulmonary abnormality seen.   Electronically Signed   By: Sabino Dick M.D.   On: 01/31/2014 20:49     ECG: NSR with PAC's   Impression and Recommendations  1. Hypertensive Urgency: Now better controlled after initiation of nitroglycerin paste, and one dose of labetalol. Would recommend restarting losartan at home dose. I reviewed echocardiogram report completed less than 2 weeks ago which did indicate diastolic dysfunction without a grade noted.I have requested records from [facility] for recent office notes.  No plans to repeat echo.  2. Worsening Shortness of Breath: History of asthma. Recently been placed on inhalers as outpatient, with a  steroid Unipak tapered dosing. Mildly elevated pro BNP. This could be related to her lung disease. She states she gained 10 pounds over the last 2 weeks, with some lower extremity edema. I am not finding that now on assessment, but she has been receiving Lasix. Cannot rule out diastolic CHF.  3. Hypercholesterolemia: Recently been placed on pravastatin as an outpatient.  4. Anemia: Noted change in Hgb over night with diureses from 11.3 to 9.7. Will check anemia profile.   5. Morbid Obesity; Patient is recommended for increased activity, and decrease calorie intake.    Signed: Phill Myron. Purcell Nails NP [facility] 02/01/2014, 8:08 AM Co-Sign MD

## 2014-02-01 NOTE — Consult Note (Signed)
The patient was seen and examined, and I agree with the assessment and plan as documented above, with modifications as noted below. Very pleasant 80 yr old woman who tells me she had been doing well until 4/15 when she saw a PA who changed her medications including antihypertensives, and "has gone downhill since then". She became increasingly short of breath and orthopneic and has been admitted with hypertensive urgency. Recent echo demonstrated normal LV systolic function, mild LVH, diastolic dysfunction, with mild MR/TR. She has been given IV Lasix, one dose of labetalol, and nitro paste and is feeling better. Losartan has since been restarted. BNP was elevated, but CXR was normal. Troponins normal. ECG shows sinus rhythm and is relatively unremarkable. It appears Imdur 10 mg bid was added which I have d/c. I will start amlodipine 5 mg daily and d/c nitro paste as well, as she has been complaining of an excruciating headache. I will d/c scheduled Lasix and give one dose, and d/c statin (ordered at 5 mg daily, pravastatin started earlier this month as per patient). She would like her meds simplified and I am in agreement with this plan. If her BP remains relatively well controlled, she can likely d/c tomorrow.

## 2014-02-01 NOTE — Progress Notes (Signed)
Note: This document was prepared with digital dictation and possible smart phrase technology. Any transcriptional errors that result from this process are unintentional.   Kristie Morris [id number] DOB: [birth year] DOA: 01/31/2014 PCP: [name], MD  Brief narrative: 80 y/o ?, known h/o Htn, Asthma, Allergies, prior h/o diverticulitis + IBS and h/o CDIFF, Esophageal dilatation 2004, non-obstructive CAd on Cath 11/29/2001 [admitted then with SOB] admitted 01/31/14 with hypertensive urgency, and potential cardiogenic dyspnea with BNP of 1055 in the setting of recent multiple changes to the medication-she was recent changed to lisinopril which caused cough and she did not take her medications.  Previously for 15 years she had been on HCTZ-Triamterene which had been transitioned to ACE.  She states that she gained about 10 pounds over the past 2-3 weeks Echocardiogram was done recently 01/26/14  Past medical history-As per Problem list Chart reviewed as below- Reviewed  Consultants:  Cardiology  Procedures:  None  Antibiotics:  None   Subjective  Feeling a little better.  Less SOB No N/V.   NO CP.  No radiation into arm No weakness unilaterally    Objective    Interim History: none  Telemetry:    Objective: Filed Vitals:   01/31/14 2329 02/01/14 0251 02/01/14 0434 02/01/14 0658  BP:  153/69 148/66   Pulse:  76 70   Temp:      TempSrc:      Resp:   20   Height:      Weight:   101 kg (222 lb 10.6 oz)   SpO2: 95%  96% 96%   No intake or output data in the 24 hours ending 02/01/14 0921  Exam:  General: Pleasant oriented  Cardiovascular: s1 s2 no m/r/g  Respiratory:  Clear , mild rales Lower lung fileds Abdomen: soft, NT/Nd Skin trace edema Neuro intact  Data Reviewed: Basic Metabolic Panel:  Recent Labs Lab 01/31/14 1930 01/31/14 2311 02/01/14 0443  NA 147  --  146  K 3.8  --  3.8  CL 104  --  106  CO2 29  --  30  GLUCOSE 128*  --  93  BUN  19  --  21  CREATININE 0.92  --  0.84  CALCIUM 9.1  --  8.4  MG  --  1.9  --   PHOS  --  3.6  --    Liver Function Tests: No results found for this basename: AST, ALT, ALKPHOS, BILITOT, PROT, ALBUMIN,  in the last 168 hours No results found for this basename: LIPASE, AMYLASE,  in the last 168 hours No results found for this basename: AMMONIA,  in the last 168 hours CBC:  Recent Labs Lab 01/31/14 1930 02/01/14 0443  WBC 14.0* 11.1*  HGB 11.3* 9.7*  HCT 36.2 31.2*  MCV 86.8 86.4  PLT 355 340   Cardiac Enzymes:  Recent Labs Lab 01/31/14 1930 01/31/14 2311 02/01/14 0443  TROPONINI <0.30 <0.30 <0.30   BNP: No components found with this basename: POCBNP,  CBG: No results found for this basename: GLUCAP,  in the last 168 hours  No results found for this or any previous visit (from the past 240 hour(s)).   Studies:              All Imaging reviewed and is as per above notation   Scheduled Meds: . amLODipine  5 mg Oral Daily  . aspirin EC  81 mg Oral Daily  . cholecalciferol  1,000 Units Oral q morning -  10a  . dicyclomine  20 mg Oral Daily  . enoxaparin (LOVENOX) injection  40 mg Subcutaneous Q24H  . ipratropium  2.5 mL Inhalation TID  . losartan  50 mg Oral Daily  . mometasone-formoterol  2 puff Inhalation BID  . pantoprazole  40 mg Oral Daily  . sodium chloride  3 mL Intravenous Q12H  . vitamin B-12  2,500 mcg Oral Daily  . vitamin E  400 Units Oral q morning - 10a   Continuous Infusions:    Assessment/Plan: 1. Flash pulm edema-Echo done recently had no concerns other than Ef 65%.  We will continue her Anti-Htn meds.  SHe could have had more SOB from asthma but that is unlikely as her last true attack was about 8 yrs ago, and there is circumstantiality regarding her recently being d/c off of a combination diuretic HCTZ-triamterene 2. Htn-poor control-Cardiology has changed Meds-Added amlodipine 5 mg, continue Losartan 50 daily.  Nitrates d/c as had headache with  this.  PRN Hydralazine and Labetalol for SBP above 160/90.  May increase amlodipine if still elevate din am 3. CP-resolved 4. Leukocytosis-probably reactive without any fever or chills 5. COPd-prior smoker-continue Ipratropium 2.5 ml tid, contineu Dulera 2 puffs bid 6. Prior IBS/Diverticulitis-continue Bentyl 20 daily 7. Osteoporosis-continue Cholecalciferol 1,000 mg q am 8. Gerd-continue Pantoprazole 40 daily 9. Borderline Microcytic anemia-note Anemia panel ordered.  Continue b12-follow results-above USPTF age cut-off for colonoscopy.  Hemoccult stools    Code Status: Full Family Communication: none bedside Disposition Plan:  inpatient   Verneita Griffes, MD  [facility] Pager [phone] 02/01/2014, 9:21 AM    LOS: 1 day

## 2014-02-02 ENCOUNTER — Encounter (HOSPITAL_COMMUNITY): Payer: Self-pay | Admitting: Internal Medicine

## 2014-02-02 DIAGNOSIS — D649 Anemia, unspecified: Secondary | ICD-10-CM

## 2014-02-02 DIAGNOSIS — I10 Essential (primary) hypertension: Secondary | ICD-10-CM | POA: Diagnosis present

## 2014-02-02 DIAGNOSIS — J449 Chronic obstructive pulmonary disease, unspecified: Secondary | ICD-10-CM | POA: Diagnosis present

## 2014-02-02 DIAGNOSIS — I5189 Other ill-defined heart diseases: Secondary | ICD-10-CM | POA: Diagnosis present

## 2014-02-02 DIAGNOSIS — E785 Hyperlipidemia, unspecified: Secondary | ICD-10-CM

## 2014-02-02 DIAGNOSIS — J4489 Other specified chronic obstructive pulmonary disease: Secondary | ICD-10-CM

## 2014-02-02 DIAGNOSIS — J45909 Unspecified asthma, uncomplicated: Secondary | ICD-10-CM | POA: Insufficient documentation

## 2014-02-02 HISTORY — DX: Anemia, unspecified: D64.9

## 2014-02-02 LAB — COMPREHENSIVE METABOLIC PANEL
ALT: 18 U/L (ref 0–35)
AST: 9 U/L (ref 0–37)
Albumin: 3.2 g/dL — ABNORMAL LOW (ref 3.5–5.2)
Alkaline Phosphatase: 124 U/L — ABNORMAL HIGH (ref 39–117)
BUN: 26 mg/dL — AB (ref 6–23)
CALCIUM: 8.7 mg/dL (ref 8.4–10.5)
CO2: 29 mEq/L (ref 19–32)
Chloride: 101 mEq/L (ref 96–112)
Creatinine, Ser: 1.07 mg/dL (ref 0.50–1.10)
GFR calc non Af Amer: 45 mL/min — ABNORMAL LOW (ref >90)
GFR, EST AFRICAN AMERICAN: 53 mL/min — AB (ref >90)
GLUCOSE: 106 mg/dL — AB (ref 70–99)
Potassium: 3.5 mEq/L — ABNORMAL LOW (ref 3.7–5.3)
Sodium: 145 mEq/L (ref 137–147)
TOTAL PROTEIN: 6.5 g/dL (ref 6.0–8.3)
Total Bilirubin: 0.5 mg/dL (ref 0.3–1.2)

## 2014-02-02 LAB — CBC
HEMATOCRIT: 36 % (ref 36.0–46.0)
Hemoglobin: 11.6 g/dL — ABNORMAL LOW (ref 12.0–15.0)
MCH: 27.4 pg (ref 26.0–34.0)
MCHC: 32.2 g/dL (ref 30.0–36.0)
MCV: 84.9 fL (ref 78.0–100.0)
Platelets: 386 10*3/uL (ref 150–400)
RBC: 4.24 MIL/uL (ref 3.87–5.11)
RDW: 15.4 % (ref 11.5–15.5)
WBC: 13.2 10*3/uL — ABNORMAL HIGH (ref 4.0–10.5)

## 2014-02-02 LAB — IRON AND TIBC
Iron: 51 ug/dL (ref 42–135)
Saturation Ratios: 13 % — ABNORMAL LOW (ref 20–55)
TIBC: 408 ug/dL (ref 250–470)
UIBC: 357 ug/dL (ref 125–400)

## 2014-02-02 LAB — OCCULT BLOOD X 1 CARD TO LAB, STOOL: Fecal Occult Bld: NEGATIVE

## 2014-02-02 MED ORDER — POTASSIUM CHLORIDE CRYS ER 10 MEQ PO TBCR
30.0000 meq | EXTENDED_RELEASE_TABLET | Freq: Two times a day (BID) | ORAL | Status: DC
  Administered 2014-02-02: 30 meq via ORAL
  Filled 2014-02-02 (×2): qty 1

## 2014-02-02 MED ORDER — POTASSIUM CHLORIDE CRYS ER 15 MEQ PO TBCR: 15.0000 meq | EXTENDED_RELEASE_TABLET | Freq: Every day | ORAL | Status: DC

## 2014-02-02 MED ORDER — HYDROCHLOROTHIAZIDE 12.5 MG PO CAPS: 12.5000 mg | ORAL_CAPSULE | Freq: Every day | ORAL | Status: DC

## 2014-02-02 MED ORDER — AMLODIPINE BESYLATE 5 MG PO TABS: 5.0000 mg | ORAL_TABLET | Freq: Every day | ORAL | Status: DC

## 2014-02-02 MED ORDER — HYDROCHLOROTHIAZIDE 12.5 MG PO CAPS
12.5000 mg | ORAL_CAPSULE | Freq: Every day | ORAL | Status: DC
  Administered 2014-02-02: 12.5 mg via ORAL
  Filled 2014-02-02: qty 1

## 2014-02-02 NOTE — Care Management Utilization Note (Signed)
UR completed 

## 2014-02-02 NOTE — Care Management Note (Signed)
    Page 1 of 1   02/02/2014     12:36:21 PM CARE MANAGEMENT NOTE 02/02/2014  Patient:  Kristie Morris, Kristie Morris   Account Number:  [id number]  Date Initiated:  02/02/2014  Documentation initiated by:  Vladimir Creeks  Subjective/Objective Assessment:   Admitted from home, lives alone, and  is independent in the home. She states she does fine with the help of her children as needed.     Action/Plan:   No needs identified   Anticipated DC Date:  02/02/2014   Anticipated DC Plan:  [place]  CM consult      Choice offered to / List presented to:             Status of service:  Completed, signed off Medicare Important Message given?  YES (If response is "NO", the following Medicare IM given date fields will be blank) Date Medicare IM given:  02/02/2014 Date Additional Medicare IM given:    Discharge Disposition:  HOME/SELF CARE  Per UR Regulation:  Reviewed for med. necessity/level of care/duration of stay  If discussed at [place] of Stay Meetings, dates discussed:    Comments:  02/02/14 [place] RN/CM

## 2014-02-02 NOTE — Progress Notes (Signed)
The patient was seen and examined, and I agree with the assessment and plan as documented above, with modifications as noted below. Pt is doing well. BP better, but not optimally controlled. Amlodipine started on 4/30, and can be titrated upwards as needed. I will see her in the office for monitoring within the next 2 weeks.

## 2014-02-02 NOTE — Progress Notes (Signed)
Discharge instructions and prescriptions given, verbalized understanding, out in stable condition via w/c.

## 2014-02-02 NOTE — Discharge Summary (Signed)
Physician Discharge Summary  Kristie Morris [id number] DOB: 03-Oct-1925 DOA: 01/31/2014  PCP: [name], MD  Admit date: 01/31/2014 Discharge date: 02/02/2014  Time spent: Greater than 30 minutes  Recommendations for Outpatient Follow-up:  1. Recommend followup of her serum potassium.   Discharge Diagnoses:  1. Malignant hypertension/hypertensive urgency. 2. Diastolic dysfunction with possible mild diastolic congestive heart failure exacerbation. --- Recent 2-D echocardiogram 01/2014 revealed normal left ventricular systolic function, mild LVH, diastolic dysfunction, and mild MR/TR. 3. Nonobstructive coronary artery disease, per cardiac catheterization in 2003. 4. COPD without exacerbation. 5. Hyperlipidemia. 6. Obesity. 7. Normocytic anemia. Anemia panel relatively normal and revealed a total iron of 51, TIBC of 408, folate of 11.1, and vitamin B12 level of 777. Stool guaiac negative x1.  Discharge Condition: Improved.  Diet recommendation: Heart healthy.  Filed Weights   02/01/14 0434 02/01/14 0900 02/02/14 [id number]  Weight: 101 kg (222 lb 10.6 oz) 102.059 kg (225 lb) 96.752 kg (213 lb 4.8 oz)    History of present illness:   Kristie Morris is a 80 y.o. female  With a history of hypertension, asthma, and seasonal allergies, who presented to the emergency department for shortness of breath. Patient stated over the past 2 weeks she had been having increasing shortness of breath, however, it worsened. Patient stated she usually sleeps on one to 2 pillows. Patient stated that over the past couple of weeks her blood pressure medications had been changed quite a few times. Patient was taking triamterene for approximately 15 years, however this was changed just 2 weeks ago to lisinopril, as her primary care physician was concerned about her kidney function. Once on lisinopril, patient developed coughing as well as dizziness and weakness, she was then switched to losartan. Patient stated she  takes her medications as prescribed. She states she's had some weight gain in the last couple of weeks. Patient stateed she had an echocardiogram conducted approximately one week ago, however, was not told the results. She stated her blood pressure had been low to normal for the past several years, however, it had been elevated since changing her blood pressure medication. On admission, she denied any chest pain, headache, dizziness, abdominal pain.; her shortness of breath had improved.   Hospital Course:   1. Malignant hypertension. In the ED, her blood pressure was 192/104. She was given 1 dose of IV Lasix, 1 dose of IV labetalol, and nitro paste in the ED. Eventually, losartan was restarted. When the nitroglycerin paste was discontinued, Imdur was added. Cardiology was consulted. Dr. Bronson Ing discontinued Imdur and started amlodipine at 5 mg daily. Eventually, hydrochlorothiazide was also started at 12.5 mg daily. Her blood pressure improved. Further monitoring of her blood pressure and adjustment in her antihypertensive medications as recommended. 2. Possible mild acute diastolic heart failure. Her 2-D echocardiogram in April [id number] revealed diastolic dysfunction but no grade was mentioned. Her left ventricle systolic function was within normal limits. Her TSH was within normal limits during this hospitalization. Although there was no acute cardiopulmonary abnormalities or edema on chest x-ray, her pro BNP was modestly elevated at 1055. Following IV Lasix, she diuresed 2.6 L. Her dyspnea resolved. Lasix was discontinued upon discharge. 3. History of nonobstructive coronary artery disease. The patient was maintained on medical therapy. Her troponin I was negative x4. She had no complaints of chest pain at the time of discharge. 4. COPD/asthma. She was maintained on her inhalers. There was no significant wheezes her bronchospasms during the hospitalization. 5. Hypokalemia. She was  started on  potassium chloride supplementation upon discharge. 6. Normocytic anemia. An anemia panel was ordered and was relatively normal. Her stool was guaiac negative x1. Further evaluation will be deferred to her primary care physician.  Procedures:  None  Consultations:  [facility]  Discharge Exam: Filed Vitals:   02/02/14 0720  BP: 161/88  Pulse: 94  Temp: 98.1 F (36.7 C)  Resp: 20    General: Pleasant obese 80 year old woman sitting up in bed, in no acute distress. Cardiovascular: S1, S2, with a soft systolic murmur. Respiratory: Clear to auscultation bilaterally. Extremities: No pedal edema.  Discharge Instructions You were cared for by a hospitalist during your hospital stay. If you have any questions about your discharge medications or the care you received while you were in the hospital after you are discharged, you can call the unit and asked to speak with the hospitalist on call if the hospitalist that took care of you is not available. Once you are discharged, your primary care physician will handle any further medical issues. Please note that NO REFILLS for any discharge medications will be authorized once you are discharged, as it is imperative that you return to your primary care physician (or establish a relationship with a primary care physician if you do not have one) for your aftercare needs so that they can reassess your need for medications and monitor your lab values.  Discharge Orders   Future Appointments Provider Department Dept Phone   02/14/2014 1:40 PM Herminio Commons, MD [facility] [phone]   Future Orders Complete By Expires   Diet - low sodium heart healthy  As directed    Increase activity slowly  As directed        Medication List    STOP taking these medications       ibuprofen 200 MG tablet  Commonly known as:  ADVIL,MOTRIN     predniSONE 10 MG tablet  Commonly known as:  STERAPRED UNI-PAK      TAKE these  medications       amLODipine 5 MG tablet  Commonly known as:  NORVASC  Take 1 tablet (5 mg total) by mouth daily. New medication to treat your high blood pressure.     aspirin EC 81 MG tablet  Take 81 mg by mouth daily.     ATROVENT HFA 17 MCG/ACT inhaler  Generic drug:  ipratropium  Inhale 1 puff into the lungs 4 (four) times daily.     cholecalciferol 1000 UNITS tablet  Commonly known as:  VITAMIN D  Take 1,000 Units by mouth every morning.     dicyclomine 20 MG tablet  Commonly known as:  BENTYL  Take 20 mg by mouth daily.     EYE VITAMINS PO  Take 1 tablet by mouth every morning.     Fluticasone-Salmeterol 250-50 MCG/DOSE Aepb  Commonly known as:  ADVAIR  Inhale 1 puff into the lungs 2 (two) times daily.     hydrochlorothiazide 12.5 MG capsule  Commonly known as:  MICROZIDE  Take 1 capsule (12.5 mg total) by mouth daily. For treatment of your high blood pressure.     losartan 50 MG tablet  Commonly known as:  COZAAR  Take 50 mg by mouth daily.     Naproxen Sodium 220 MG Caps  Take 440 mg by mouth 2 (two) times daily.     omeprazole 20 MG capsule  Commonly known as:  PRILOSEC  Take 20 mg by mouth daily.  potassium chloride SA 15 MEQ tablet  Commonly known as:  KLOR-CON M15  Take 1 tablet (15 mEq total) by mouth daily.     pravastatin 10 MG tablet  Commonly known as:  PRAVACHOL  Take 10 mg by mouth at bedtime.     PROAIR HFA 108 (90 BASE) MCG/ACT inhaler  Generic drug:  albuterol  Inhale 1-2 puffs into the lungs every 6 (six) hours as needed. For shortness of breath wheezing     Vitamin B-12 2500 MCG Subl  Place 1 tablet under the tongue every morning.     vitamin E 400 UNIT capsule  Take 400 Units by mouth every morning.       Allergies  Allergen Reactions  . Zithromax [Azithromycin] Diarrhea  . Celebrex [Celecoxib] Hives  . Ciprofloxacin Hives  . Penicillins Hives  . Polymyxin B Hives  . Sulfa Antibiotics Hives  . Vancomycin Hives  .  Vioxx [Rofecoxib] Hives       Follow-up Information   Follow up with Herminio Commons, MD On 02/14/2014. (1:40 pm)    Specialty:  Cardiology   Contact information:   [id number] [place] [address] [phone]       Schedule an appointment as soon as possible for a visit with [name], MD.   Specialties:  Internal Medicine, Pediatrics   Contact information:   [address] [place] [address] [phone]        The results of significant diagnostics from this hospitalization (including imaging, microbiology, ancillary and laboratory) are listed below for reference.    Significant Diagnostic Studies: Dg Chest 2 View  01/31/2014   CLINICAL DATA:  Shortness of breath.  EXAM: CHEST  2 VIEW  COMPARISON:  None.  FINDINGS: Mild cardiomegaly is noted. Surgical clips are noted in both axillary regions. No pneumothorax or pleural effusion is noted. No acute pulmonary disease is noted. Bony thorax intact.  IMPRESSION: No acute cardiopulmonary abnormality seen.   Electronically Signed   By: Sabino Dick M.D.   On: 01/31/2014 20:49    Microbiology: No results found for this or any previous visit (from the past 240 hour(s)).   Labs: Basic Metabolic Panel:  Recent Labs Lab 01/31/14 1930 01/31/14 2311 02/01/14 0443 02/02/14 0445  NA 147  --  146 145  K 3.8  --  3.8 3.5*  CL 104  --  106 101  CO2 29  --  30 29  GLUCOSE 128*  --  93 106*  BUN 19  --  21 26*  CREATININE 0.92  --  0.84 1.07  CALCIUM 9.1  --  8.4 8.7  MG  --  1.9  --   --   PHOS  --  3.6  --   --    Liver Function Tests:  Recent Labs Lab 02/02/14 0445  AST 9  ALT 18  ALKPHOS 124*  BILITOT 0.5  PROT 6.5  ALBUMIN 3.2*   No results found for this basename: LIPASE, AMYLASE,  in the last 168 hours No results found for this basename: AMMONIA,  in the last 168 hours CBC:  Recent Labs Lab 01/31/14 1930 02/01/14 0443 02/02/14 0445  WBC 14.0* 11.1* 13.2*  HGB 11.3* 9.7* 11.6*   HCT 36.2 31.2* 36.0  MCV 86.8 86.4 84.9  PLT 355 340 386   Cardiac Enzymes:  Recent Labs Lab 01/31/14 1930 01/31/14 2311 02/01/14 0443 02/01/14 1058  TROPONINI <0.30 <0.30 <0.30 <0.30   BNP: BNP (last  3 results)  Recent Labs  01/31/14 1930  PROBNP 1055.0*   CBG: No results found for this basename: GLUCAP,  in the last 168 hours     Signed:  Rexene Alberts  [facility] 02/02/2014, 10:48 AM

## 2014-02-02 NOTE — Progress Notes (Signed)
Consulting cardiologist:Koneswarn, Jamesetta So MD Primary Cardiologist: Loreta Ave MD  Subjective:    Doing well. Wants to go home!   Objective:   Temp:  [97.5 F (36.4 C)-98.1 F (36.7 C)] 98.1 F (36.7 C) (05/01 0720) Pulse Rate:  [78-94] 94 (05/01 0720) Resp:  [20] 20 (05/01 0720) BP: (144-161)/(68-88) 161/88 mmHg (05/01 0720) SpO2:  [91 %-94 %] 91 % (05/01 0720) Weight:  [225 lb (102.059 kg)] 225 lb (102.059 kg) (04/30 0900) Last BM Date: 02/01/14  Filed Weights   01/31/14 2320 02/01/14 0434 02/01/14 0900  Weight: 220 lb (99.791 kg) 222 lb 10.6 oz (101 kg) 225 lb (102.059 kg)    Intake/Output Summary (Last 24 hours) at 02/02/14 0816 Last data filed at 02/02/14 0738  Gross per 24 hour  Intake    483 ml  Output   3000 ml  Net  -2517 ml     Exam:  General: No acute distress.  HEENT: Conjunctiva and lids normal, oropharynx clear.  Lungs: Clear to auscultation, nonlabored.  Cardiac: No elevated JVP or bruits. RRR, no gallop or rub.   Abdomen: Normoactive bowel sounds, nontender, nondistended.  Extremities: No pitting edema, distal pulses full.  Neuropsychiatric: Alert and oriented x3, affect appropriate.   Lab Results:  Basic Metabolic Panel:  Recent Labs Lab 01/31/14 1930 01/31/14 2311 02/01/14 0443 02/02/14 0445  NA 147  --  146 145  K 3.8  --  3.8 3.5*  CL 104  --  106 101  CO2 29  --  30 29  GLUCOSE 128*  --  93 106*  BUN 19  --  21 26*  CREATININE 0.92  --  0.84 1.07  CALCIUM 9.1  --  8.4 8.7  MG  --  1.9  --   --     Liver Function Tests:  Recent Labs Lab 02/02/14 0445  AST 9  ALT 18  ALKPHOS 124*  BILITOT 0.5  PROT 6.5  ALBUMIN 3.2*    CBC:  Recent Labs Lab 01/31/14 1930 02/01/14 0443 02/02/14 0445  WBC 14.0* 11.1* 13.2*  HGB 11.3* 9.7* 11.6*  HCT 36.2 31.2* 36.0  MCV 86.8 86.4 84.9  PLT 355 340 386    Cardiac Enzymes:  Recent Labs Lab 01/31/14 2311 02/01/14 0443 02/01/14 1058  TROPONINI <0.30 <0.30 <0.30     BNP:  Recent Labs  01/31/14 1930  PROBNP 1055.0*    Radiology: Dg Chest 2 View  01/31/2014   CLINICAL DATA:  Shortness of breath.  EXAM: CHEST  2 VIEW  COMPARISON:  None.  FINDINGS: Mild cardiomegaly is noted. Surgical clips are noted in both axillary regions. No pneumothorax or pleural effusion is noted. No acute pulmonary disease is noted. Bony thorax intact.  IMPRESSION: No acute cardiopulmonary abnormality seen.   Electronically Signed   By: Sabino Dick M.D.   On: 01/31/2014 20:49      Medications:   Scheduled Medications: . amLODipine  5 mg Oral Daily  . aspirin EC  81 mg Oral Daily  . cholecalciferol  1,000 Units Oral q morning - 10a  . dicyclomine  20 mg Oral Daily  . enoxaparin (LOVENOX) injection  40 mg Subcutaneous Q24H  . ipratropium  2.5 mL Inhalation TID  . losartan  50 mg Oral Daily  . mometasone-formoterol  2 puff Inhalation BID  . pantoprazole  40 mg Oral Daily  . potassium chloride  30 mEq Oral BID  . sodium chloride  3 mL Intravenous Q12H  .  vitamin B-12  2,500 mcg Oral Daily  . vitamin E  400 Units Oral q morning - 10a    Infusions:    PRN Medications: sodium chloride, acetaminophen, albuterol, hydrALAZINE, sodium chloride   Assessment and Plan:   1 Hypertension:  Blood pressure is much better controlled, but not completely optimal. She would like to go back on HCTZ as she tolerated it before and it kept her BP stable. She diuresed well on lasix. Wt is down from 225 to 213 lbs. Creatinine 1.07 post lasix.   Review of records from [facility] practice reveals that she was on Triamterene/HCTZ (37.5/25) before being changed to lisinopril 20 mg and then to cozaar. Labs completed on that office visit assessment (01/17/2014) revealed creatinine of 1.0,with GFR of 51. Notes state she has a history of CKD, with plans to refer to nephrologist. I do not currently see a medical reason to do so.  LEE or puffiness can happen with use of amlodipine and is  normal I agree with TED hose if she tolerates them as per [facility] recommendation as OP.  Will restart HCTZ 12.5 mg daily and continue cozaar 50 mg and amlodipine 5 as she is taking now. We will follow up with her in the office. She has a previously scheduled appt on May 13th with Dr. Bronson Ing, which was made originally to be established with our practice. She will keep that for post-hospital follow up.   BMET will be checked again just before that visit.   2. Dyspnea:Significant improvement in symptoms with diureses. Echo demonstrated normal LV fx, but did mention diastolic dysfunction. No grade was documented. She should continue steroid inhalers.   3. Anemia: Hgb at 11.6 this am. WBC's are slightly more elevated at 13.2 from 11.1 likely from steroids. Review of anemia profile demonstrates low saturation levels, but otherwise normal values.   4. Hypercholesterolemia: Continues on pravastatin.        Phill Myron. Purcell Nails NP [facility] 02/02/2014, 8:16 AM

## 2014-02-14 ENCOUNTER — Encounter: Payer: Self-pay | Admitting: Cardiovascular Disease

## 2014-02-14 ENCOUNTER — Ambulatory Visit (INDEPENDENT_AMBULATORY_CARE_PROVIDER_SITE_OTHER): Payer: Medicare HMO | Admitting: Cardiovascular Disease

## 2014-02-14 VITALS — BP 120/72 | HR 82 | Ht 63.0 in | Wt 209.0 lb

## 2014-02-14 DIAGNOSIS — M791 Myalgia, unspecified site: Secondary | ICD-10-CM

## 2014-02-14 DIAGNOSIS — I10 Essential (primary) hypertension: Secondary | ICD-10-CM

## 2014-02-14 DIAGNOSIS — IMO0001 Reserved for inherently not codable concepts without codable children: Secondary | ICD-10-CM

## 2014-02-14 MED ORDER — POTASSIUM CHLORIDE CRYS ER 20 MEQ PO TBCR: 20.0000 meq | EXTENDED_RELEASE_TABLET | ORAL | Status: DC | PRN

## 2014-02-14 MED ORDER — HYDROCHLOROTHIAZIDE 12.5 MG PO CAPS: 12.5000 mg | ORAL_CAPSULE | ORAL | Status: DC | PRN

## 2014-02-14 NOTE — Patient Instructions (Addendum)
Your physician recommends that you schedule a follow-up appointment in: 3 months with Dr Virgina Jock will receive a reminder letter two months in advance reminding you to call and schedule your appointment. If you don't receive this letter, please contact our office.  Your physician has recommended you make the following change in your medication:   Stop Pravastatin  ONLY TAKE potassium and HCTZ together as needed.

## 2014-02-14 NOTE — Progress Notes (Signed)
Patient ID: Kristie Morris, female   DOB: [birth year], 80 y.o.   MRN: [id number]      SUBJECTIVE: The patient is an 80 year old woman whom I saw in consultation for malignant hypertension and an elevated BNP. She was given one dose of IV Lasix, 1 dose of IV labetalol, and some nitro paste and consequently felt better. A chest x-ray was normal. She has normal left ventricular systolic function, mild LVH, mild valvular insufficiency, and diastolic dysfunction. I started her on amlodipine 5 mg daily at that time.  She has been feeling well and denies orthopnea, shortness of breath, and PND. She has very seldom had ankle swelling. She has developed some left-sided chest myalgias since starting pravastatin. She had to use the bathroom 6 times last night to urinate. She is taking 12.5 mg HCTZ daily.  She has been monitoring her BP and it has remained well controlled, but she has had a few SBP readings in the high 90 to low 100 mmHg range.    Allergies  Allergen Reactions  . Zithromax [Azithromycin] Diarrhea  . Celebrex [Celecoxib] Hives  . Ciprofloxacin Hives  . Penicillins Hives  . Polymyxin B Hives  . Sulfa Antibiotics Hives  . Vancomycin Hives  . Vioxx [Rofecoxib] Hives    Current Outpatient Prescriptions  Medication Sig Dispense Refill  . amLODipine (NORVASC) 5 MG tablet Take 1 tablet (5 mg total) by mouth daily. New medication to treat your high blood pressure.  30 tablet  2  . aspirin EC 81 MG tablet Take 81 mg by mouth daily.      . ATROVENT HFA 17 MCG/ACT inhaler Inhale 1 puff into the lungs 4 (four) times daily.      . cholecalciferol (VITAMIN D) 1000 UNITS tablet Take 1,000 Units by mouth every morning.      . Cyanocobalamin (VITAMIN B-12) 2500 MCG SUBL Place 1 tablet under the tongue every morning.      . dicyclomine (BENTYL) 20 MG tablet Take 20 mg by mouth daily.      . Fluticasone-Salmeterol (ADVAIR) 250-50 MCG/DOSE AEPB Inhale 1 puff into the lungs 2 (two) times daily.      .  hydrochlorothiazide (MICROZIDE) 12.5 MG capsule Take 1 capsule (12.5 mg total) by mouth daily. For treatment of your high blood pressure.  30 capsule  2  . losartan (COZAAR) 50 MG tablet Take 50 mg by mouth daily.      . Multiple Vitamins-Minerals (EYE VITAMINS PO) Take 1 tablet by mouth every morning.      . Naproxen Sodium 220 MG CAPS Take 440 mg by mouth 2 (two) times daily.      Marland Kitchen omeprazole (PRILOSEC) 20 MG capsule Take 20 mg by mouth daily.      . potassium chloride (KLOR-CON M15) 15 MEQ tablet Take 1 tablet (15 mEq total) by mouth daily.  30 tablet  2  . pravastatin (PRAVACHOL) 10 MG tablet Take 10 mg by mouth at bedtime.      Marland Kitchen PROAIR HFA 108 (90 BASE) MCG/ACT inhaler Inhale 1-2 puffs into the lungs every 6 (six) hours as needed. For shortness of breath wheezing      . vitamin E 400 UNIT capsule Take 400 Units by mouth every morning.       No current facility-administered medications for this visit.    Past Medical History  Diagnosis Date  . Hypertension   . Asthma   . Crohn disease   . Cancer     breast  .  COPD (chronic obstructive pulmonary disease)   . Malignant hypertension 02/02/2014  . Diastolic dysfunction [id number]  . Anemia 02/02/2014    Anemia panel WNL  . Hyperlipidemia   . CAD (coronary artery disease) 2003    Cath with nonobstructive CAD    Past Surgical History  Procedure Laterality Date  . Mastectomy    . Back surgery    . Knee surgery    . Abdominal hysterectomy      History   Social History  . Marital Status: Widowed    Spouse Name: N/A    Number of Children: N/A  . Years of Education: N/A   Occupational History  . Not on file.   Social History Main Topics  . Smoking status: Never Smoker   . Smokeless tobacco: Not on file  . Alcohol Use: No  . Drug Use: No  . Sexual Activity: Not on file   Other Topics Concern  . Not on file   Social History Narrative  . No narrative on file      PHYSICAL EXAM General: NAD Neck: No JVD, no  thyromegaly. Lungs: Clear to auscultation bilaterally with normal respiratory effort. CV: Nondisplaced PMI.  Regular rate and rhythm, normal S1/S2, no S3/S4, no murmur. No pretibial or periankle edema.  No carotid bruit.  Normal pedal pulses.  Abdomen: Soft, nontender, no hepatosplenomegaly, no distention.  Neurologic: Alert and oriented x 3.  Psych: Normal affect. Extremities: No clubbing or cyanosis.   ECG: reviewed and available in electronic records.      ASSESSMENT AND PLAN: 1. Malignant HTN: I will continue amlodipine 5 mg daily. I will have her use hydrochlorothiazide and potassium chloride only as needed for leg swelling. I have asked her to continue to monitor her blood pressure and to inform me of her readings after 3-4 weeks. 2. Myalgias: I will have her discontinue pravastatin, as her symptoms only began after starting this.  Dispo: f/u 3 months.  Kate Sable, M.D., F.A.C.C.

## 2014-03-07 ENCOUNTER — Telehealth: Payer: Self-pay | Admitting: *Deleted

## 2014-03-07 NOTE — Telephone Encounter (Signed)
Please advise 

## 2014-03-07 NOTE — Telephone Encounter (Signed)
Pt is calling to go over BP readings.  02/16/14 114/74 02/18/14 122/67 02/21/14 90/60 02/23/14 107/67 02/27/14 98/60 03/01/14 102/58 03/03/14 109/57 03/05/14 106/52

## 2014-03-08 NOTE — Addendum Note (Signed)
Addended by: Truett Mainland on: 03/08/2014 09:21 AM   Modules accepted: Orders

## 2014-03-08 NOTE — Telephone Encounter (Signed)
Reduce amlodipine to  2.5 mg daily

## 2014-03-08 NOTE — Telephone Encounter (Signed)
Pt understood to take Amlodipine to 2.5 mg. Pt will continue to take BP readings and let us know.

## 2014-03-17 ENCOUNTER — Emergency Department (HOSPITAL_COMMUNITY): Payer: Medicare HMO

## 2014-03-17 ENCOUNTER — Encounter (HOSPITAL_COMMUNITY): Payer: Self-pay | Admitting: Emergency Medicine

## 2014-03-17 ENCOUNTER — Observation Stay (HOSPITAL_COMMUNITY)
Admission: EM | Admit: 2014-03-17 | Discharge: 2014-03-19 | Disposition: A | Discharge status: Home / Self Care | Payer: Medicare HMO | Attending: Internal Medicine | Admitting: Internal Medicine

## 2014-03-17 DIAGNOSIS — J449 Chronic obstructive pulmonary disease, unspecified: Secondary | ICD-10-CM | POA: Diagnosis present

## 2014-03-17 DIAGNOSIS — R55 Syncope and collapse: Secondary | ICD-10-CM | POA: Insufficient documentation

## 2014-03-17 DIAGNOSIS — Z79899 Other long term (current) drug therapy: Secondary | ICD-10-CM | POA: Insufficient documentation

## 2014-03-17 DIAGNOSIS — R609 Edema, unspecified: Secondary | ICD-10-CM | POA: Insufficient documentation

## 2014-03-17 DIAGNOSIS — R0600 Dyspnea, unspecified: Secondary | ICD-10-CM

## 2014-03-17 DIAGNOSIS — M25569 Pain in unspecified knee: Secondary | ICD-10-CM | POA: Insufficient documentation

## 2014-03-17 DIAGNOSIS — I10 Essential (primary) hypertension: Secondary | ICD-10-CM | POA: Insufficient documentation

## 2014-03-17 DIAGNOSIS — J45901 Unspecified asthma with (acute) exacerbation: Secondary | ICD-10-CM

## 2014-03-17 DIAGNOSIS — I2510 Atherosclerotic heart disease of native coronary artery without angina pectoris: Secondary | ICD-10-CM | POA: Insufficient documentation

## 2014-03-17 DIAGNOSIS — E785 Hyperlipidemia, unspecified: Secondary | ICD-10-CM | POA: Insufficient documentation

## 2014-03-17 DIAGNOSIS — I5033 Acute on chronic diastolic (congestive) heart failure: Secondary | ICD-10-CM

## 2014-03-17 DIAGNOSIS — D649 Anemia, unspecified: Secondary | ICD-10-CM | POA: Insufficient documentation

## 2014-03-17 DIAGNOSIS — Z853 Personal history of malignant neoplasm of breast: Secondary | ICD-10-CM | POA: Insufficient documentation

## 2014-03-17 DIAGNOSIS — I519 Heart disease, unspecified: Secondary | ICD-10-CM | POA: Insufficient documentation

## 2014-03-17 DIAGNOSIS — J441 Chronic obstructive pulmonary disease with (acute) exacerbation: Secondary | ICD-10-CM | POA: Insufficient documentation

## 2014-03-17 DIAGNOSIS — I5189 Other ill-defined heart diseases: Secondary | ICD-10-CM | POA: Diagnosis present

## 2014-03-17 DIAGNOSIS — K5090 Crohn's disease, unspecified, without complications: Secondary | ICD-10-CM | POA: Insufficient documentation

## 2014-03-17 DIAGNOSIS — Z880 Allergy status to penicillin: Secondary | ICD-10-CM | POA: Insufficient documentation

## 2014-03-17 DIAGNOSIS — I509 Heart failure, unspecified: Principal | ICD-10-CM | POA: Insufficient documentation

## 2014-03-17 DIAGNOSIS — J45909 Unspecified asthma, uncomplicated: Secondary | ICD-10-CM

## 2014-03-17 DIAGNOSIS — M7989 Other specified soft tissue disorders: Secondary | ICD-10-CM | POA: Insufficient documentation

## 2014-03-17 LAB — BASIC METABOLIC PANEL
BUN: 17 mg/dL (ref 6–23)
CHLORIDE: 104 meq/L (ref 96–112)
CO2: 28 mEq/L (ref 19–32)
Calcium: 9.5 mg/dL (ref 8.4–10.5)
Creatinine, Ser: 0.9 mg/dL (ref 0.50–1.10)
GFR calc Af Amer: 65 mL/min — ABNORMAL LOW (ref >90)
GFR calc non Af Amer: 56 mL/min — ABNORMAL LOW (ref >90)
GLUCOSE: 117 mg/dL — AB (ref 70–99)
POTASSIUM: 4.3 meq/L (ref 3.7–5.3)
Sodium: 145 mEq/L (ref 137–147)

## 2014-03-17 LAB — CBC WITH DIFFERENTIAL/PLATELET
Basophils Absolute: 0 10*3/uL (ref 0.0–0.1)
Basophils Relative: 0 % (ref 0–1)
Eosinophils Absolute: 0.3 10*3/uL (ref 0.0–0.7)
Eosinophils Relative: 3 % (ref 0–5)
HEMATOCRIT: 38.2 % (ref 36.0–46.0)
HEMOGLOBIN: 12.1 g/dL (ref 12.0–15.0)
Lymphocytes Relative: 18 % (ref 12–46)
Lymphs Abs: 1.7 10*3/uL (ref 0.7–4.0)
MCH: 27.1 pg (ref 26.0–34.0)
MCHC: 31.7 g/dL (ref 30.0–36.0)
MCV: 85.5 fL (ref 78.0–100.0)
MONO ABS: 0.9 10*3/uL (ref 0.1–1.0)
MONOS PCT: 10 % (ref 3–12)
NEUTROS ABS: 6.3 10*3/uL (ref 1.7–7.7)
NEUTROS PCT: 69 % (ref 43–77)
Platelets: 355 10*3/uL (ref 150–400)
RBC: 4.47 MIL/uL (ref 3.87–5.11)
RDW: 14.9 % (ref 11.5–15.5)
WBC: 9.2 10*3/uL (ref 4.0–10.5)

## 2014-03-17 LAB — I-STAT TROPONIN, ED: Troponin i, poc: 0 ng/mL (ref 0.00–0.08)

## 2014-03-17 LAB — PRO B NATRIURETIC PEPTIDE: Pro B Natriuretic peptide (BNP): 127.9 pg/mL (ref 0–450)

## 2014-03-17 NOTE — ED Provider Notes (Addendum)
CSN: [id number]     Arrival date & time 03/17/14  2151 History  This chart was scribed for Varney Biles, MD by Roe Coombs, ED Scribe. The patient was seen in room APA14/APA14. Patient's care was started at 11:09 PM.   Chief Complaint  Patient presents with  . Shortness of Breath  . Hypertension    The history is provided by the patient. No language interpreter was used.    HPI Comments: Kristie Morris is a 80 y.o. female who presents to the Emergency Department complaining of moderate, shortness of breath that began yesterday evening. Patient reports dyspnea on exertion and says that she becomes more short of breath when walking the distance from her front door to her mailbox. Patient further reports that shortness of breath improves with rest and while sitting down. She denies orthopnea. Patient is also complaining of elevated blood pressure for the past 3 days. She states that she was initially diagnosed with hypertension about 1 month ago so her PCP started her on antihypertensives. Patient says that she was having low blood pressure after starting new medicines so her blood pressure medication was reduced, but notably today, her blood pressure was 169/101 prior to arrival. Patient also states that 3 days ago, she developed chest discomfort that she describes as tightness. She also had an episode of lightheadedness on Wednesday with the onset of her chest discomfort. Her other symptoms include bilateral leg swelling, and left knee pain that is chronic in nature. She states that knee pain sometimes wake her up at night. She denies headaches, rash, abdominal pain, nausea, fever, or vomiting. She has no history of DVT/PE. She has a history of cardiac catheterization in 2003 that showed nonobstructive CAD. She has no history of MI and has not had any recent stress tests.   Past Medical History  Diagnosis Date  . Hypertension   . Asthma   . Crohn disease   . Cancer     breast  . COPD (chronic  obstructive pulmonary disease)   . Malignant hypertension 02/02/2014  . Diastolic dysfunction [id number]  . Anemia 02/02/2014    Anemia panel WNL  . Hyperlipidemia   . CAD (coronary artery disease) 2003    Cath with nonobstructive CAD   Past Surgical History  Procedure Laterality Date  . Mastectomy    . Back surgery    . Knee surgery    . Abdominal hysterectomy     Family History  Problem Relation Age of Onset  . Heart attack Mother   . Heart attack Sister    History  Substance Use Topics  . Smoking status: Never Smoker   . Smokeless tobacco: Not on file  . Alcohol Use: No   OB History   Grav Para Term Preterm Abortions TAB SAB Ect Mult Living                 Review of Systems  Constitutional: Negative for fever.  Respiratory: Positive for chest tightness and shortness of breath.   Cardiovascular: Positive for leg swelling. Negative for chest pain.  Gastrointestinal: Negative for nausea, vomiting and abdominal pain.  Musculoskeletal: Positive for arthralgias (left knee pain).  Skin: Negative for rash.  Neurological: Negative for headaches.     Allergies  Zithromax; Celebrex; Ciprofloxacin; Penicillins; Polymyxin b; Sulfa antibiotics; Vancomycin; and Vioxx  Home Medications   Prior to Admission medications   Medication Sig Start Date End Date Taking? Authorizing Provider  amLODipine (NORVASC) 2.5 MG tablet Take 2.5 mg  by mouth daily.    Historical Provider, MD  aspirin EC 81 MG tablet Take 81 mg by mouth daily.    Historical Provider, MD  ATROVENT HFA 17 MCG/ACT inhaler Inhale 1 puff into the lungs 4 (four) times daily. 01/29/14   Historical Provider, MD  cholecalciferol (VITAMIN D) 1000 UNITS tablet Take 1,000 Units by mouth every morning.    Historical Provider, MD  Cyanocobalamin (VITAMIN B-12) 2500 MCG SUBL Place 1 tablet under the tongue every morning.    Historical Provider, MD  dicyclomine (BENTYL) 20 MG tablet Take 20 mg by mouth daily.    Historical Provider,  MD  Fluticasone-Salmeterol (ADVAIR) 250-50 MCG/DOSE AEPB Inhale 1 puff into the lungs 2 (two) times daily.    Historical Provider, MD  hydrochlorothiazide (MICROZIDE) 12.5 MG capsule Take 1 capsule (12.5 mg total) by mouth as needed. 02/14/14   Herminio Commons, MD  losartan (COZAAR) 50 MG tablet  01/26/14   Historical Provider, MD  Multiple Vitamins-Minerals (EYE VITAMINS PO) Take 1 tablet by mouth every morning.    Historical Provider, MD  Naproxen Sodium 220 MG CAPS Take 440 mg by mouth 2 (two) times daily.    Historical Provider, MD  omeprazole (PRILOSEC) 20 MG capsule Take 20 mg by mouth daily.    Historical Provider, MD  potassium chloride SA (K-DUR,KLOR-CON) 20 MEQ tablet Take 1 tablet (20 mEq total) by mouth as needed. 02/14/14   Herminio Commons, MD  pravastatin (PRAVACHOL) 10 MG tablet  01/17/14   Historical Provider, MD  PROAIR HFA 108 (90 BASE) MCG/ACT inhaler Inhale 1-2 puffs into the lungs every 6 (six) hours as needed. For shortness of breath wheezing 01/26/14   Historical Provider, MD  vitamin E 400 UNIT capsule Take 400 Units by mouth every morning.    Historical Provider, MD   Triage Vitals: BP 160/84  Pulse 88  Temp(Src) 97.9 F (36.6 C) (Oral)  Resp 18  Ht 5\' 4"  (1.626 m)  Wt 220 lb (99.791 kg)  BMI 37.74 kg/m2  SpO2 94% Physical Exam  Nursing note and vitals reviewed. Constitutional: She is oriented to person, place, and time. She appears well-developed and well-nourished. No distress.  HENT:  Head: Normocephalic and atraumatic.  Eyes: Conjunctivae and EOM are normal.  Neck: Normal range of motion. No tracheal deviation present.  Cardiovascular: Normal rate, regular rhythm and normal heart sounds.  Exam reveals no gallop and no friction rub.   No murmur heard. Pulmonary/Chest: Effort normal and breath sounds normal. No respiratory distress. She has no wheezes. She has no rales.  Lungs clear to auscultation bilaterally.  Abdominal: There is no tenderness. There is  no rebound and no guarding.  Musculoskeletal: Normal range of motion. She exhibits edema.  Lower extremities: Bilateral swelling with no pitting edema appreciated, left ankle is worse than right.   Neurological: She is alert and oriented to person, place, and time.  Skin: Skin is warm and dry.  Psychiatric: She has a normal mood and affect. Her behavior is normal.    ED Course  Procedures (including critical care time) DIAGNOSTIC STUDIES: Oxygen Saturation is 94% on room air, adequate by my interpretation.    COORDINATION OF CARE: 11:15 PM- Patient states that shortness of breath is improved with supplemental oxygen. Patient informed of current plan for treatment and evaluation and agrees with plan at this time.  12:10 AM - Patient's oxygen saturation 92% at lowest while ambulating. Patient also states that she is "winded" while walking in  the department. Discussed with patient that since she is still having dyspnea with exertion on supplemental oxygen, I recommend admission for continued treatment. Patient verbalizes understanding and agrees to plan.     Labs Review Labs Reviewed  BASIC METABOLIC PANEL - Abnormal; Notable for the following:    Glucose, Bld 117 (*)    GFR calc non Af Amer 56 (*)    GFR calc Af Amer 65 (*)    All other components within normal limits  CBC WITH DIFFERENTIAL  PRO B NATRIURETIC PEPTIDE  TROPONIN I  I-STAT TROPOININ, ED    Imaging Review Dg Chest Portable 1 View  03/17/2014   CLINICAL DATA:  Irregular heartbeat. Shortness of breath and weakness.  EXAM: PORTABLE CHEST - 1 VIEW  COMPARISON:  Chest radiograph performed 01/31/2014  FINDINGS: The lungs are well-aerated. Mild vascular congestion is noted. Mild bibasilar atelectasis is seen. There is no evidence of pleural effusion or pneumothorax.  The cardiomediastinal silhouette is borderline normal in size. No acute osseous abnormalities are seen. Scattered clips are seen at both axilla.  IMPRESSION: Mild  vascular congestion noted; mild bibasilar atelectasis seen. Lungs otherwise clear.   Electronically Signed   By: Garald Balding M.D.   On: 03/17/2014 23:51     EKG Interpretation None      Date: 03/18/2014  Rate: 79  Rhythm: normal sinus rhythm  QRS Axis: left  Intervals: normal  ST/T Wave abnormalities: nonspecific ST/T changes  Conduction Disutrbances:none  Narrative Interpretation:   Old EKG Reviewed: changes noted - q waves inferior leads more prominent   MDM   Final diagnoses:  Acute exacerbation of CHF (congestive heart failure)  Near syncope  Left ankle swelling  I personally performed the services described in this documentation, which was scribed in my presence. The recorded information has been reviewed and is accurate.   Pt with DIB. Recent admission for DIB, and found to have CHF - diastolic. Not on lasix. Lung exam - mild crackles, however, Xrays shows some congestion and she started having dyspnea with minimal exertion. Also had near syncope this morning.  Exam also reveals mild left ankle swelling. Probably a good candidate for DVT workup during the admission. No [place] at this hour in the ER, so we will not be initiating that workup in the ER.  Will admit for optimization.   Varney Biles, MD 03/18/14 [id number]  Varney Biles, MD 03/18/14 [id number]

## 2014-03-17 NOTE — ED Notes (Signed)
MD at bedside to assess pt.

## 2014-03-17 NOTE — ED Notes (Signed)
Patient reports shortness of breath and elevated blood pressure since last night.

## 2014-03-18 ENCOUNTER — Encounter (HOSPITAL_COMMUNITY): Payer: Self-pay | Admitting: *Deleted

## 2014-03-18 DIAGNOSIS — D649 Anemia, unspecified: Secondary | ICD-10-CM

## 2014-03-18 DIAGNOSIS — I509 Heart failure, unspecified: Secondary | ICD-10-CM

## 2014-03-18 DIAGNOSIS — J449 Chronic obstructive pulmonary disease, unspecified: Secondary | ICD-10-CM

## 2014-03-18 DIAGNOSIS — I10 Essential (primary) hypertension: Secondary | ICD-10-CM

## 2014-03-18 DIAGNOSIS — I5033 Acute on chronic diastolic (congestive) heart failure: Secondary | ICD-10-CM

## 2014-03-18 LAB — TROPONIN I
Troponin I: <0.3 ng/mL (ref <0.30)
Troponin I: <0.3 ng/mL (ref <0.30)

## 2014-03-18 LAB — MAGNESIUM: MAGNESIUM: 2 mg/dL (ref 1.5–2.5)

## 2014-03-18 MED ORDER — DICYCLOMINE HCL 20 MG PO TABS
20.0000 mg | ORAL_TABLET | Freq: Every day | ORAL | Status: DC
  Filled 2014-03-18 (×2): qty 1

## 2014-03-18 MED ORDER — ALBUTEROL SULFATE (2.5 MG/3ML) 0.083% IN NEBU
2.5000 mg | INHALATION_SOLUTION | RESPIRATORY_TRACT | Status: DC | PRN
  Administered 2014-03-18: 2.5 mg via RESPIRATORY_TRACT

## 2014-03-18 MED ORDER — AMLODIPINE BESYLATE 5 MG PO TABS
2.5000 mg | ORAL_TABLET | Freq: Every day | ORAL | Status: DC
  Administered 2014-03-18 – 2014-03-19 (×2): 2.5 mg via ORAL
  Filled 2014-03-18 (×2): qty 1

## 2014-03-18 MED ORDER — ASPIRIN EC 81 MG PO TBEC
81.0000 mg | DELAYED_RELEASE_TABLET | Freq: Every day | ORAL | Status: DC
  Administered 2014-03-18 – 2014-03-19 (×2): 81 mg via ORAL
  Filled 2014-03-18 (×2): qty 1

## 2014-03-18 MED ORDER — DICYCLOMINE HCL 10 MG PO CAPS
20.0000 mg | ORAL_CAPSULE | Freq: Every day | ORAL | Status: DC
  Administered 2014-03-18 – 2014-03-19 (×2): 20 mg via ORAL
  Filled 2014-03-18 (×2): qty 2

## 2014-03-18 MED ORDER — SODIUM CHLORIDE 0.9 % IJ SOLN
3.0000 mL | Freq: Two times a day (BID) | INTRAMUSCULAR | Status: DC
Start: 2014-03-18 — End: 2014-03-19
  Administered 2014-03-18 – 2014-03-19 (×2): 3 mL via INTRAVENOUS

## 2014-03-18 MED ORDER — SODIUM CHLORIDE 0.9 % IV SOLN: 250.0000 mL | INTRAVENOUS | Status: DC | PRN

## 2014-03-18 MED ORDER — HYDROCODONE-ACETAMINOPHEN 5-325 MG PO TABS
1.0000 | ORAL_TABLET | Freq: Four times a day (QID) | ORAL | Status: DC | PRN
  Administered 2014-03-18 – 2014-03-19 (×2): 2 via ORAL
  Filled 2014-03-18 (×2): qty 2

## 2014-03-18 MED ORDER — IPRATROPIUM-ALBUTEROL 0.5-2.5 (3) MG/3ML IN SOLN
3.0000 mL | Freq: Four times a day (QID) | RESPIRATORY_TRACT | Status: DC
  Administered 2014-03-18: 3 mL via RESPIRATORY_TRACT
  Filled 2014-03-18: qty 3

## 2014-03-18 MED ORDER — ONDANSETRON HCL 4 MG/2ML IJ SOLN
4.0000 mg | Freq: Four times a day (QID) | INTRAMUSCULAR | Status: DC | PRN
  Administered 2014-03-18: 4 mg via INTRAVENOUS
  Filled 2014-03-18: qty 2

## 2014-03-18 MED ORDER — FUROSEMIDE 10 MG/ML IJ SOLN
20.0000 mg | Freq: Once | INTRAMUSCULAR | Status: AC
  Administered 2014-03-18: 20 mg via INTRAVENOUS
  Filled 2014-03-18: qty 2

## 2014-03-18 MED ORDER — VITAMIN D 1000 UNITS PO TABS
1000.0000 [IU] | ORAL_TABLET | Freq: Every morning | ORAL | Status: DC
  Administered 2014-03-18 – 2014-03-19 (×2): 1000 [IU] via ORAL
  Filled 2014-03-18 (×2): qty 1

## 2014-03-18 MED ORDER — FUROSEMIDE 10 MG/ML IJ SOLN
40.0000 mg | Freq: Every day | INTRAMUSCULAR | Status: DC
  Administered 2014-03-18: 40 mg via INTRAVENOUS
  Filled 2014-03-18: qty 4

## 2014-03-18 MED ORDER — IPRATROPIUM-ALBUTEROL 0.5-2.5 (3) MG/3ML IN SOLN
3.0000 mL | Freq: Two times a day (BID) | RESPIRATORY_TRACT | Status: DC
  Administered 2014-03-18 – 2014-03-19 (×2): 3 mL via RESPIRATORY_TRACT
  Filled 2014-03-18 (×2): qty 3

## 2014-03-18 MED ORDER — ALBUTEROL SULFATE (2.5 MG/3ML) 0.083% IN NEBU
INHALATION_SOLUTION | RESPIRATORY_TRACT | Status: AC
Start: 2014-03-18 — End: 2014-03-19
  Filled 2014-03-18: qty 3

## 2014-03-18 MED ORDER — SODIUM CHLORIDE 0.9 % IJ SOLN: 3.0000 mL | INTRAMUSCULAR | Status: DC | PRN

## 2014-03-18 MED ORDER — IPRATROPIUM BROMIDE HFA 17 MCG/ACT IN AERS: 1.0000 | INHALATION_SPRAY | Freq: Four times a day (QID) | RESPIRATORY_TRACT | Status: DC

## 2014-03-18 NOTE — Progress Notes (Signed)
Spoke to the Pt and the grand daughter about the Pt's breathing. Pt stated that she hasnt smoked in 74 or 50 years. I asked the Pt if she thought the breathing TX were helping her SOB and she said it wasn't. I placed the Pt on 2l to help with her SOB. She said it helped a little. The Pt and her family were concerned about a blood clot in her leg and wanted an ultrasound done. Doctor was informed

## 2014-03-18 NOTE — Progress Notes (Signed)
UR Completed.  Kristie Morris Jane [phone] 03/18/2014  

## 2014-03-18 NOTE — H&P (Signed)
PCP:   [name], MD   Chief Complaint:  sob  HPI: 80 yo female h/o diastolic chf, htn, copd comes in with several days of sob and uncontrolled bp at home sbp over 200 today.  She was hospitalized a couple of months ago for chf exac and malignant htn, when she went to f/u with her pcp her bp meds were decreased b/c her systolic was right below [id number] so one of her pills were cut in half in the last week or so.  But since then her bp has gotten really high and she is more sob again.  No cp.  No swelling.  No fevers.  No orthopnea or pnd.  No cough.  When she gets up to move around, she gets very sob, and today she almost passed out and had to sit down and rest and the dizziness resolved.  Review of Systems:  Positive and negative as per HPI otherwise all other systems are negative  Past Medical History: Past Medical History  Diagnosis Date  . Hypertension   . Asthma   . Crohn disease   . Cancer     breast  . COPD (chronic obstructive pulmonary disease)   . Malignant hypertension 02/02/2014  . Diastolic dysfunction [id number]  . Anemia 02/02/2014    Anemia panel WNL  . Hyperlipidemia   . CAD (coronary artery disease) 2003    Cath with nonobstructive CAD   Past Surgical History  Procedure Laterality Date  . Mastectomy    . Back surgery    . Knee surgery    . Abdominal hysterectomy      Medications: Prior to Admission medications   Medication Sig Start Date End Date Taking? Authorizing Provider  amLODipine (NORVASC) 2.5 MG tablet Take 2.5 mg by mouth daily.    Historical Provider, MD  aspirin EC 81 MG tablet Take 81 mg by mouth daily.    Historical Provider, MD  ATROVENT HFA 17 MCG/ACT inhaler Inhale 1 puff into the lungs 4 (four) times daily. 01/29/14   Historical Provider, MD  cholecalciferol (VITAMIN D) 1000 UNITS tablet Take 1,000 Units by mouth every morning.    Historical Provider, MD  Cyanocobalamin (VITAMIN B-12) 2500 MCG SUBL Place 1 tablet under the tongue every  morning.    Historical Provider, MD  dicyclomine (BENTYL) 20 MG tablet Take 20 mg by mouth daily.    Historical Provider, MD  Fluticasone-Salmeterol (ADVAIR) 250-50 MCG/DOSE AEPB Inhale 1 puff into the lungs 2 (two) times daily.    Historical Provider, MD  hydrochlorothiazide (MICROZIDE) 12.5 MG capsule Take 1 capsule (12.5 mg total) by mouth as needed. 02/14/14   Herminio Commons, MD  losartan (COZAAR) 50 MG tablet  01/26/14   Historical Provider, MD  Multiple Vitamins-Minerals (EYE VITAMINS PO) Take 1 tablet by mouth every morning.    Historical Provider, MD  Naproxen Sodium 220 MG CAPS Take 440 mg by mouth 2 (two) times daily.    Historical Provider, MD  omeprazole (PRILOSEC) 20 MG capsule Take 20 mg by mouth daily.    Historical Provider, MD  potassium chloride SA (K-DUR,KLOR-CON) 20 MEQ tablet Take 1 tablet (20 mEq total) by mouth as needed. 02/14/14   Herminio Commons, MD  pravastatin (PRAVACHOL) 10 MG tablet  01/17/14   Historical Provider, MD  PROAIR HFA 108 (90 BASE) MCG/ACT inhaler Inhale 1-2 puffs into the lungs every 6 (six) hours as needed. For shortness of breath wheezing 01/26/14   Historical Provider, MD  vitamin  E 400 UNIT capsule Take 400 Units by mouth every morning.    Historical Provider, MD    Allergies:   Allergies  Allergen Reactions  . Zithromax [Azithromycin] Diarrhea  . Celebrex [Celecoxib] Hives  . Ciprofloxacin Hives  . Penicillins Hives  . Polymyxin B Hives  . Sulfa Antibiotics Hives  . Vancomycin Hives  . Vioxx [Rofecoxib] Hives    Social History:  reports that she has never smoked. She does not have any smokeless tobacco history on file. She reports that she does not drink alcohol or use illicit drugs.  Family History: Family History  Problem Relation Age of Onset  . Heart attack Mother   . Heart attack Sister     Physical Exam: Filed Vitals:   03/17/14 2221 03/18/14 0013 03/18/14 0031  BP: 160/84  110/84  Pulse: 88  78  Temp: 97.9 F (36.6  C)  97.9 F (36.6 C)  TempSrc: Oral  Oral  Resp: 18  20  Height: 5\' 4"  (1.626 m)    Weight: 99.791 kg (220 lb)    SpO2: 94% 93% 94%   General appearance: alert, cooperative and no distress Head: Normocephalic, without obvious abnormality, atraumatic Eyes: negative Nose: Nares normal. Septum midline. Mucosa normal. No drainage or sinus tenderness. Neck: no JVD and supple, symmetrical, trachea midline Lungs: clear to auscultation bilaterally Heart: regular rate and rhythm, S1, S2 normal, no murmur, click, rub or gallop Abdomen: soft, non-tender; bowel sounds normal; no masses,  no organomegaly Extremities: extremities normal, atraumatic, no cyanosis or edema Pulses: 2+ and symmetric Skin: Skin color, texture, turgor normal. No rashes or lesions Neurologic: Grossly normal  Labs on Admission:   Recent Labs  03/17/14 2238  NA 145  K 4.3  CL 104  CO2 28  GLUCOSE 117*  BUN 17  CREATININE 0.90  CALCIUM 9.5    Recent Labs  03/17/14 2238  WBC 9.2  NEUTROABS 6.3  HGB 12.1  HCT 38.2  MCV 85.5  PLT 355   Radiological Exams on Admission: Dg Chest Portable 1 View  03/17/2014   CLINICAL DATA:  Irregular heartbeat. Shortness of breath and weakness.  EXAM: PORTABLE CHEST - 1 VIEW  COMPARISON:  Chest radiograph performed 01/31/2014  FINDINGS: The lungs are well-aerated. Mild vascular congestion is noted. Mild bibasilar atelectasis is seen. There is no evidence of pleural effusion or pneumothorax.  The cardiomediastinal silhouette is borderline normal in size. No acute osseous abnormalities are seen. Scattered clips are seen at both axilla.  IMPRESSION: Mild vascular congestion noted; mild bibasilar atelectasis seen. Lungs otherwise clear.   Electronically Signed   By: Garald Balding M.D.   On: 03/17/2014 23:51    Assessment/Plan  80 yo female with acute chf exacerbation and labile htn  Principal Problem:   Diastolic CHF, acute on chronic-  She is not on diuretics chronically,  but was on lasix during last hospitalization,  Suspect her chf exac is due to labile htn.  Place on chf pathway, will not repeat echo.  Ambulate in the am to see if she gets hypoxic.  Ck orthostatics.  obs on tele while serialing cardiac enzymes.    Active Problems:  Stable unless o/w noted   Dyspnea   Malignant hypertension   Diastolic dysfunction   COPD (chronic obstructive pulmonary disease)  obs on tele.  Full code.  DAVID,RACHAL A 03/18/2014, 12:38 AM

## 2014-03-18 NOTE — Progress Notes (Signed)
Walked in to see patient to do assessment.  Patient very upset.  Patient stated she would rather go home, she could die at home instead of in the hospital.  Patient had a very upsetting episode earlier in day when she was short of breathe.  Patient stated staff did not respond in time ( 20 minutes), and she felt as if she wasn't being treated with respect by staff.  Explained to patient that she was indeed ill and should at least stay one more night in hospital to be monitored. Patient currently on oxygen at 3l, and explained to patient no one would set up oxygen tonight at her residence.  Patient's grandson came to floor to help calm patient and convince her to remain in hospital.  Yolanda Bonine very concerned about grandmother's health.  Grandmother has had decline since April.  While in room talking to grandson and patient, telemetry called and said patient was in v-tach.  Patient at that time in no distress, alert, talking.  ekg done, mid-level notified.   Orders given and carried out. Patient had no c/o chest pain.  Explained to patient reason for ekg and blood work that was needed.  Her and grandson in agreement.  Patient did decide to stay the night, due to grandson. Family is concerned about patient and is anxious about care.  Explained to patient that everything would be done to help her have a comfortable night.  Patient accepting and understanding.  Will continue to monitor patient.

## 2014-03-18 NOTE — ED Notes (Signed)
Report given to receiving RN.

## 2014-03-18 NOTE — Progress Notes (Signed)
Patient seen and examined, and data base reviewed. Patient seen earlier today by my colleague Dr. Shanon Brow. Patient came in to the hospital because of shortness of breath, and uncontrolled hypertension. Chest x-ray showed mild vascular congestion. Suspect her high blood pressure is secondary to CHF exacerbation and congestion/volume, started on low dose of Lasix.  Kristie Morris [phone] 03/18/2014, 12:41 PM

## 2014-03-18 NOTE — Progress Notes (Signed)
Echocardiogram 2D Echocardiogram has been performed.  Kristie Morris 03/18/2014, 2:07 PM

## 2014-03-19 ENCOUNTER — Observation Stay (HOSPITAL_COMMUNITY): Payer: Medicare HMO

## 2014-03-19 DIAGNOSIS — I10 Essential (primary) hypertension: Secondary | ICD-10-CM

## 2014-03-19 DIAGNOSIS — I519 Heart disease, unspecified: Secondary | ICD-10-CM

## 2014-03-19 DIAGNOSIS — R55 Syncope and collapse: Secondary | ICD-10-CM

## 2014-03-19 DIAGNOSIS — J45909 Unspecified asthma, uncomplicated: Secondary | ICD-10-CM

## 2014-03-19 LAB — BASIC METABOLIC PANEL
BUN: 25 mg/dL — ABNORMAL HIGH (ref 6–23)
CALCIUM: 8.9 mg/dL (ref 8.4–10.5)
CO2: 30 mEq/L (ref 19–32)
CREATININE: 1.21 mg/dL — AB (ref 0.50–1.10)
Chloride: 100 mEq/L (ref 96–112)
GFR calc Af Amer: 45 mL/min — ABNORMAL LOW (ref >90)
GFR calc non Af Amer: 39 mL/min — ABNORMAL LOW (ref >90)
GLUCOSE: 104 mg/dL — AB (ref 70–99)
Potassium: 3.6 mEq/L — ABNORMAL LOW (ref 3.7–5.3)
Sodium: 145 mEq/L (ref 137–147)

## 2014-03-19 MED ORDER — FUROSEMIDE 20 MG PO TABS: 20.0000 mg | ORAL_TABLET | Freq: Every day | ORAL | Status: DC | PRN

## 2014-03-19 NOTE — Evaluation (Addendum)
Physical Therapy Evaluation Patient Details Name: Kristie Morris MRN: [id number] DOB: 12/19/1924 Today's Date: 03/19/2014   History of Present Illness  80 yo female h/o diastolic chf, htn, copd comes in with several days of sob and uncontrolled bp at home sbp over 200 today.  She was hospitalized a couple of months ago for chf exac and malignant htn, when she went to f/u with her pcp her bp meds were decreased b/c her systolic was right below [id number] so one of her pills were cut in half in the last week or so.  But since then her bp has gotten really high and she is more sob again. No cp.  No swelling.  No fevers.  No orthopnea or pnd.  No cough.  When she gets up to move around, she gets very sob, and today she almost passed out and had to sit down and rest and the dizziness resolved.  Clinical Impression  Patient presents to physical therapy after MD referral for assessment of functional mobility skills.  Patient lives alone in a single story home, with 6 steps to enter the front door and a ramp at the back door with handrails on both.  Patient's grandson lives nearby and is able to help intermittently.  Prior to hospitalization, the patient was (I) with bed mobility skills, transfers, and ambulation skills.  Patient reports she has walkers and canes though does not prefer to use them.  Evaluation completed as patient returning from ultrasound on legs.  Patient required moderate assistance off the transport bed for trunk, though was able to get back into bed with supervision.  Patient reports "dizziness" this afternoon after receiving pain medication in the morning, and requests HHA vs. AD for ambulation in room.  Patient able to transfer sit <-> stand and amb with HHA.  Recommend continued PT while patient is in the hospital to address amb skills without AD or HHA and stair navigation, and transition to HHPT at discharge.  Recommend DME for shower seat for safety with bathing.      Follow Up Recommendations  Home health PT    Equipment Recommendations   Lobbyist)    Recommendations for Other Services       Precautions / Restrictions Precautions Precautions: None Restrictions Weight Bearing Restrictions: No      Mobility  Bed Mobility Overal bed mobility: Needs Assistance Bed Mobility: Supine to Sit;Sit to Supine     Supine to sit: Min assist (for trunk) Sit to supine: Supervision      Transfers Overall transfer level: Needs assistance Equipment used: 1 person hand held assist Transfers: Sit to/from [facility] Sit to Stand: Min guard Stand pivot transfers: Min guard          Ambulation/Gait Ambulation/Gait assistance: Min guard Ambulation Distance (Feet): 20 Feet Assistive device: 1 person hand held assist Gait Pattern/deviations: WFL(Within Functional Limits)   Gait velocity interpretation: <1.8 ft/sec, indicative of risk for recurrent falls General Gait Details: Patient reports "dizziness" secondary to receiving pain medication this morning, and requires HHA         Balance Overall balance assessment: No apparent balance deficits (not formally assessed)                                           Pertinent Vitals/Pain No pain reported.  Patient was given pain medication this morning, and now reports "  dizziness"    Home Living Family/patient expects to be discharged to:: Private residence Living Arrangements: Alone Available Help at Discharge: Family;Available PRN/intermittently (Grandson lives nearby and is able to help) Type of Home: House Home Access: Ramped entrance;Stairs to enter Entrance Stairs-Rails: Can reach both Entrance Stairs-Number of Steps: 4 steps with rail, walkway, then 2 steps with rail at entrance, ramp entrance at back door Home Layout: One level Home Equipment: Walker - standard;Cane - single point;Toilet riser;Grab bars - tub/shower      Prior Function Level of Independence: Independent                   Extremity/Trunk Assessment               Lower Extremity Assessment: Generalized weakness         Communication   Communication: No difficulties  Cognition Arousal/Alertness: Awake/alert Behavior During Therapy: WFL for tasks assessed/performed                                 Assessment/Plan    PT Assessment Patient needs continued PT services  PT Diagnosis Generalized weakness;Difficulty walking   PT Problem List Decreased strength;Decreased activity tolerance  PT Treatment Interventions Gait training;Stair training;Functional mobility training;Therapeutic activities;Therapeutic exercise   PT Goals (Current goals can be found in the Care Plan section) Acute Rehab PT Goals Patient Stated Goal: to go home PT Goal Formulation: With patient/family Time For Goal Achievement: 04/02/14 Potential to Achieve Goals: Good    Frequency Min 3X/week    End of Session Equipment Utilized During Treatment: Gait belt;Oxygen Activity Tolerance: Patient tolerated treatment well Patient left: in bed;with call bell/phone within reach;with family/visitor present           Time: 1400-1415 PT Time Calculation (min): 15 min   Charges:   PT Evaluation $Initial PT Evaluation Tier I: 1 Procedure          Kristie Morris 04-09-14, 2:22 PM  ADDENDUM: G codes : Mobility, Start CI, Goal CH, Discharge CI

## 2014-03-19 NOTE — Care Management Note (Addendum)
    Page 1 of 1   03/19/2014     2:57:02 PM CARE MANAGEMENT NOTE 03/19/2014  Patient:  Kristie Morris, Kristie Morris   Account Number:  [id number]  Date Initiated:  03/19/2014  Documentation initiated by:  Claretha Cooper  Subjective/Objective Assessment:   Pt admitted from home where she lives at home alone. Grandson and wife assists her as needed. At this time pt states [employer] has asked to provide a CM but she has refused.     Action/Plan:   Anticipated DC Date:  03/19/2014   Anticipated DC Plan:  [place]  CM consult      Choice offered to / List presented to:          [facility] arranged  HH-1 RN  [place].   Status of service:  Completed, signed off Medicare Important Message given?   (If response is "NO", the following Medicare IM given date fields will be blank) Date Medicare IM given:   Date Additional Medicare IM given:    Discharge Disposition:    Per UR Regulation:    If discussed at Long Length of Stay Meetings, dates discussed:    Comments:  03/19/14 Claretha Cooper RN CM 14:55 Pt has accepted HH through [facility], RN and PT

## 2014-03-19 NOTE — Plan of Care (Signed)
Problem: Phase I Progression Outcomes Goal: Pain controlled with appropriate interventions Outcome: Progressing Discussed pain in her knee and her her legs.  Discussed with her and her family the plan for a venous doppler to assess her legs to r/o DVT.  Goal: EF % per last Echo/documented,Core Reminder form on chart Outcome: Completed/Met Date Met:  03/19/14 discussed with the patients grandson about the EJF.

## 2014-03-19 NOTE — Consult Note (Signed)
CARDIOLOGY CONSULT NOTE   Patient ID: Kristie Morris MRN: [id number] DOB/AGE: [birth year] 80 y.o.  Admit Date: 03/17/2014 Referring Physician:PTH Primary Physician: [name], MD Consulting Cardiologist: Carlyle Dolly MD Primary Cardiologist: Kate Sable MD Reason for Consultation: CHF,NSVT, Hypertension  Clinical Summary Kristie Morris is a 80 y.o.female unknown history malignant hypertension, asthma, COPD, diastolic dysfunction, hyperlipidemia, with nonobstructive CAD for cardiac catheterization in 2003, admitted with worsening shortness of breath, near-syncope, and admitted for diastolic CHF and uncontrolled hypertension.  She states that symptoms began approximately one week prior to coming in with some cough and congestion, and lower extremity edema which worsened, leading to worsening dyspnea and orthopnea. She admits to eating salty foods to include potato chips and ham biscuits.   On arrival the patient's blood pressure is 160/84, heart rate 88, SpO2 94%, temperature 97.9. Review of labs was essentially unremarkable, negative troponin, proBNP 127.9. Chest x-ray revealed mild vascular congestion with mild bibasilar atelectasis is seen otherwise clear. EKG revealed normal sinus rhythm, rate in the 70s. She was treated with IV Lasix 20 mg x1.  She was noted yesterday as patient became very upset and wanted to go home. Stated that she was not feeling better continue short of breath despite diuresis. She remains on oxygen at 3 L. Review of weights demonstrates that the weight is labile, but admission weight was 223 pounds, this a.m. 221 pounds. She is diuresis approximately 1.5 L over the last 48 hrs. Her breathing status has improved, with lower extremity edema almost completely eliminated.    Allergies  Allergen Reactions  . Zithromax [Azithromycin] Diarrhea    Pt in hosp for 2 weeks   . Celebrex [Celecoxib] Hives  . Ciprofloxacin Hives  . Penicillins Hives  .  Polymyxin B Hives  . Sulfa Antibiotics Hives  . Vancomycin Hives  . Vioxx [Rofecoxib] Hives    Medications Scheduled Medications: . amLODipine  2.5 mg Oral Daily  . aspirin EC  81 mg Oral Daily  . cholecalciferol  1,000 Units Oral q morning - 10a  . dicyclomine  20 mg Oral Daily  . ipratropium-albuterol  3 mL Nebulization BID  . sodium chloride  3 mL Intravenous Q12H         PRN Medications:  sodium chloride, albuterol, HYDROcodone-acetaminophen, ondansetron (ZOFRAN) IV, sodium chloride   Past Medical History  Diagnosis Date  . Hypertension   . Asthma   . Crohn disease   . Cancer     breast  . COPD (chronic obstructive pulmonary disease)   . Malignant hypertension 02/02/2014  . Diastolic dysfunction [id number]  . Anemia 02/02/2014    Anemia panel WNL  . Hyperlipidemia   . CAD (coronary artery disease) 2003    Cath with nonobstructive CAD    Past Surgical History  Procedure Laterality Date  . Mastectomy    . Back surgery    . Knee surgery    . Abdominal hysterectomy      Family History  Problem Relation Age of Onset  . Heart attack Mother   . Heart attack Sister     Social History Ms. Bujak reports that she has never smoked. She does not have any smokeless tobacco history on file. Ms. Style reports that she does not drink alcohol.  Review of Systems Otherwise reviewed and negative except as outlined.  Physical Examination Blood pressure 117/61, pulse 88, temperature 98.2 F (36.8 C), temperature source Oral, resp. rate 20, height 5\' 4"  (1.626 m), weight 221 lb 12.8 oz (  100.608 kg), SpO2 93.00%.  Intake/Output Summary (Last 24 hours) at 03/19/14 0829 Last data filed at 03/19/14 0436  Gross per 24 hour  Intake    960 ml  Output   1500 ml  Net   -540 ml    Telemetry: Normal sinus rhythm. Frequent PVCs.  GEN: Anxious, talkative, but in no acute distress. HEENT: Conjunctiva and lids normal, oropharynx clear with moist mucosa. Neck: Supple, no  elevated JVP or carotid bruits, no thyromegaly. Lungs: Clear to auscultation, nonlabored breathing at rest. Cardiac: Regular rate and rhythm, no S3 or significant systolic murmur, no pericardial rub. Abdomen: Soft, nontender, no hepatomegaly, bowel sounds present, no guarding or rebound. Extremities: No pitting edema, distal pulses 2+. Skin: Warm and dry. Musculoskeletal: No kyphosis. Neuropsychiatric: Alert and oriented x3, affect grossly appropriate.  Prior Cardiac Testing/Procedures  1. Echocardiogram:03/18/2014 Left ventricle: The cavity size was normal. There was mild concentric hypertrophy. Systolic function was normal. The estimated ejection fraction was in the range of 55% to 60%. Wall motion was normal; there were no regional wall motion abnormalities. Doppler parameters are consistent with abnormal left ventricular relaxation (grade 1 diastolic dysfunction). - Aortic valve: Valve area (Vmax): 2.3 cm^2. - Left atrium: The atrium was mildly dilated. - Pericardium, extracardiac: A small pericardial effusion was identified circumferential to the heart.  2. Cardiac Cath 2003 RESULTS: The left main coronary artery is widely patent and bifurcates into a  left anterior descending artery and left circumflex artery.  The left anterior descending artery has 20% proximal luminal irregularities  and gives rise to a first diagonal Kristie Morris which has a proximal 30% narrowing.  The rest of the LAD is widely patent throughout its course.  The left circumflex is widely patent throughout its course and gives rise to  one large branching obtuse marginal Kristie Morris which is widely patent.  The right coronary artery has a mid 50% narrowing before bifurcating into the  posterior descending artery and posterolateral artery both of which are widely  patent.  Left ventriculography performed in 30 degree RAO view using a total of 30 cc  of contrast at 13 cc per second shows normal LV function, no wall  motion  abnormalities. Left ventricular pressure is 127/15 mmHg. Aortic pressure is  127/66 mmHg.  ASSESSMENT:  1. Noncardiac chest pain.  2. Nonobstructive coronary artery disease.  3. Normal left ventricular function.  Lab Results  Basic Metabolic Panel:  Recent Labs Lab 03/17/14 2238 03/18/14 2149 03/19/14 0422  NA 145  --  145  K 4.3  --  3.6*  CL 104  --  100  CO2 28  --  30  GLUCOSE 117*  --  104*  BUN 17  --  25*  CREATININE 0.90  --  1.21*  CALCIUM 9.5  --  8.9  MG  --  2.0  --      CBC:  Recent Labs Lab 03/17/14 2238  WBC 9.2  NEUTROABS 6.3  HGB 12.1  HCT 38.2  MCV 85.5  PLT 355    Cardiac Enzymes:  Recent Labs Lab 03/18/14 0044 03/18/14 0832 03/18/14 1404  TROPONINI <0.30 <0.30 <0.30    BNP: 127.0  Radiology: Dg Chest Portable 1 View  03/17/2014   CLINICAL DATA:  Irregular heartbeat. Shortness of breath and weakness.  EXAM: PORTABLE CHEST - 1 VIEW  COMPARISON:  Chest radiograph performed 01/31/2014  FINDINGS: The lungs are well-aerated. Mild vascular congestion is noted. Mild bibasilar atelectasis is seen. There is no evidence of  pleural effusion or pneumothorax.  The cardiomediastinal silhouette is borderline normal in size. No acute osseous abnormalities are seen. Scattered clips are seen at both axilla.  IMPRESSION: Mild vascular congestion noted; mild bibasilar atelectasis seen. Lungs otherwise clear.   Electronically Signed   By: Garald Balding M.D.   On: 03/17/2014 23:51     ECG: Normal sinus rhythm   Impression and Recommendations  1. Acute on Chronic Diastolic CHF; Patient has been diuresing and feeling better. Echo this admission demonstrated normal LV function. Review home meds does not have her on any diuretics the exception of Microzide. She was only given one dose of IV Lasix in the ER with improvement in breathing. She states she had one episode yesterday evening where she could not catch her breath, but this was also related  to anxiety. May need to consider a when necessary diuretic at home. No further cardiac testing is planned at this time. She will followup with Dr. Pennelope Bracken as outpatient for ongoing management.  2.Hypertension: Blood pressure much better controlled currently she is a low-sodium diet and has been counseled on this. She is been returned amlodipine 2.5 mg daily.  3.COPD: Continues on Proventil nebulizer treatments as needed. Lungs are essentially clear with some mild bibasilar crackles only. She is not coughing. She is not complaining of shortness of breath. She is on room air currently.   Signed: Phill Myron. Lawrence NP  03/19/2014, 8:29 AM Co-Sign MD Patient seen and discussed with NP Purcell Nails, I agree with her documentation above. She is an 80 yo female with hx of HTN, COPD, chronic diastolic heart failure, admitted with SOB and LE edema. The likely exacerbating factor is dietary non-compliance. Echo 03/18/14 LVEF 0000000, grade I diastolic dysfunction. She is net negative 1.3 liters since admission, Cr and BUN are trending up after 2 doses of lasix, she currently does not have any diuretic ordered. Breathing and edema have improved. BP after being elevated upon presentation have improved as well. From Dr Court Joy last clinic note she had some noted low blood pressures at times, HCTZ was changed to prn. Would hold diuretics today as she is likely hypovolemic based on her labs and exam, I would stop her prn HCTZ and change it to lasix 20mg  prn leg edema or SOB. She will need close follow up with Dr Bronson Ing or NP Purcell Nails within 2-3 weeks of discharge. Of note the reported VT on her telemetry review is actually lead artifact in 2 of the leads, lead I clearly shows normal sinus rhythm. Recommend ambulation today and monitoring of oxygen sats and symptoms, if does well can consider discharge today.  Carlyle Dolly MD

## 2014-03-19 NOTE — Discharge Summary (Signed)
Addendum  Patient seen and examined, chart and data base reviewed.  I agree with the above assessment and plan.  For full details please see [name], NP note.  Acute on chronic diastolic CHF with uncontrolled hypertension.  Questionable compliance as same medication she supposed to take at home are causing hypotension.  Follow up with cardiology.   Kristie Hopes, MD [facility] Pager: [phone] 03/19/2014, 2:38 PM

## 2014-03-19 NOTE — Discharge Summary (Signed)
Physician Discharge Summary  Kristie Morris [id number] DOB: 1924-12-07 DOA: 03/17/2014  PCP: Kristie Dura, MD  Admit date: 03/17/2014 Discharge date: 03/19/2014  Time spent: 40 minutes  Recommendations for Outpatient Follow-up:  1. Follow up with PCP 1-2 weeks for evaluation of symptoms. Recommend BMET to evaluate potassium level 2. Appointment with [name] with cardiology 04/02/14 for follow up LE edema, sob.   Discharge Diagnoses:  Principal Problem:   Diastolic CHF, acute on chronic Active Problems:   Dyspnea   Malignant hypertension   Diastolic dysfunction   COPD (chronic obstructive pulmonary disease)   Acute CHF   Discharge Condition: stable  Diet recommendation: heart healthy 2gm low sodium  Filed Weights   03/17/14 2221 03/18/14 0130 03/19/14 0436  Weight: 99.791 kg (220 lb) 101.379 kg (223 lb 8 oz) 100.608 kg (221 lb 12.8 oz)    History of present illness:  80 yo female h/o diastolic chf, htn, copd comes in on 03/18/14 with several days of sob and uncontrolled bp at home sbp over 200. She was hospitalized a couple of months prior for chf exac and malignant htn, when she went to f/u with her pcp her bp meds were decreased b/c her systolic was right below [id number] so one of her pills were cut in half in the last week or so. But since then her bp had gotten really high and she was more sob again. No cp. No swelling. No fevers. No orthopnea or pnd. No cough. When she gets up to move around, she gets very sob, and before presentation she almost passed out and had to sit down and rest and the dizziness resolved.    Hospital Course:  Diastolic CHF, acute on chronic- She is not on diuretics chronically, but was on lasix during previous hospitalization. Suspect her chf exac is due to labile htn. Given IV lasix and evaluated by cardiology who opine that exacerbating factor likely dietary non-compliance. Cards recommending holding diuretics for one day and discontinuing HCTZ and  add prn lasix 20mg  daily as needed for leg edema and/or SOB. Echo with LV [id number], grade 1 diastolic dysfunction. Troponin neg x3. Weight down 2kg.  At discharge volume status -1.3L.     Active Problems:  Dyspnea: chest xray with Mild vascular congestion noted; mild bibasilar atelectasis seen. Lungs otherwise clear. See above. At discharge resolved.  Malignant hypertension: elevated on admission. Somewhat soft after 2 doses IV lasix. HCTZ discontinued. Provided with lasix daily prn at discharge. See #1.   Diastolic dysfunction: echo this admission yields EF 0000000, grade 1 diastolic dysfunction.   COPD (chronic obstructive pulmonary disease): stable at baseline.   Procedures: echoe with EF 55-60% and grade 1 diastolic dysfunction Consultations:  Dr Harl Bowie cardiology  Discharge Exam: Filed Vitals:   03/19/14 0436  BP: 117/61  Pulse: 88  Temp: 98.2 F (36.8 C)  Resp: 20    General: obese appears comfortable Cardiovascular: RRR No MGR No pitting LE edema Respiratory: normal effort BS clear bilaterally no crackles  Discharge Instructions You were cared for by a hospitalist during your hospital stay. If you have any questions about your discharge medications or the care you received while you were in the hospital after you are discharged, you can call the unit and asked to speak with the hospitalist on call if the hospitalist that took care of you is not available. Once you are discharged, your primary care physician will handle any further medical issues. Please note that NO REFILLS for any discharge  medications will be authorized once you are discharged, as it is imperative that you return to your primary care physician (or establish a relationship with a primary care physician if you do not have one) for your aftercare needs so that they can reassess your need for medications and monitor your lab values.     Medication List    STOP taking these medications       hydrochlorothiazide  12.5 MG capsule  Commonly known as:  MICROZIDE      TAKE these medications       amLODipine 2.5 MG tablet  Commonly known as:  NORVASC  Take 2.5 mg by mouth daily.     aspirin EC 81 MG tablet  Take 81 mg by mouth daily.     cholecalciferol 1000 UNITS tablet  Commonly known as:  VITAMIN D  Take 1,000 Units by mouth every morning.     dicyclomine 20 MG tablet  Commonly known as:  BENTYL  Take 20 mg by mouth daily.     Fluticasone-Salmeterol 250-50 MCG/DOSE Aepb  Commonly known as:  ADVAIR  Inhale 1 puff into the lungs 2 (two) times daily.     furosemide 20 MG tablet  Commonly known as:  LASIX  Take 1 tablet (20 mg total) by mouth daily as needed for edema (for increased leg swelling and/or shortness of breath.).  Start taking on:  03/20/2014     multivitamin-lutein Caps capsule  Take 1 capsule by mouth daily.     Naproxen Sodium 220 MG Caps  Take 440 mg by mouth 2 (two) times daily as needed (pain).     omeprazole 20 MG capsule  Commonly known as:  PRILOSEC  Take 20 mg by mouth daily.     potassium chloride SA 20 MEQ tablet  Commonly known as:  K-DUR,KLOR-CON  Take 20 mEq by mouth daily as needed (for potassium supplement).     PROBIOTIC PO  Take 1 capsule by mouth daily.     Vitamin B-12 2500 MCG Subl  Place 1 tablet under the tongue every morning.     vitamin C 1000 MG tablet  Take 1,000 mg by mouth daily.     vitamin E 400 UNIT capsule  Take 400 Units by mouth every morning.       Allergies  Allergen Reactions  . Zithromax [Azithromycin] Diarrhea    Pt in hosp for 2 weeks   . Celebrex [Celecoxib] Hives  . Ciprofloxacin Hives  . Penicillins Hives  . Polymyxin B Hives  . Sulfa Antibiotics Hives  . Vancomycin Hives  . Vioxx [Rofecoxib] Hives   Follow-up Information   Follow up with Kristie Dura, MD. Schedule an appointment as soon as possible for a visit in 1 week. (for evaluation of symptoms. recommend BMET to evaluate potassium level)     Specialties:  Internal Medicine, Pediatrics   Contact information:   [address] [place] [address] [phone]       Follow up with Jory Sims, NP On 04/02/2014. (appointment at 1:50 pm. )    Specialty:  Nurse Practitioner   Contact information:   [address] [place] [address] [phone]        The results of significant diagnostics from this hospitalization (including imaging, microbiology, ancillary and laboratory) are listed below for reference.    Significant Diagnostic Studies: Dg Chest Portable 1 View  03/17/2014   CLINICAL DATA:  Irregular heartbeat. Shortness of breath and weakness.  EXAM: PORTABLE  CHEST - 1 VIEW  COMPARISON:  Chest radiograph performed 01/31/2014  FINDINGS: The lungs are well-aerated. Mild vascular congestion is noted. Mild bibasilar atelectasis is seen. There is no evidence of pleural effusion or pneumothorax.  The cardiomediastinal silhouette is borderline normal in size. No acute osseous abnormalities are seen. Scattered clips are seen at both axilla.  IMPRESSION: Mild vascular congestion noted; mild bibasilar atelectasis seen. Lungs otherwise clear.   Electronically Signed   By: Garald Balding M.D.   On: 03/17/2014 23:51    Microbiology: No results found for this or any previous visit (from the past 240 hour(s)).   Labs: Basic Metabolic Panel:  Recent Labs Lab 03/17/14 2238 03/18/14 2149 03/19/14 0422  NA 145  --  145  K 4.3  --  3.6*  CL 104  --  100  CO2 28  --  30  GLUCOSE 117*  --  104*  BUN 17  --  25*  CREATININE 0.90  --  1.21*  CALCIUM 9.5  --  8.9  MG  --  2.0  --    Liver Function Tests: No results found for this basename: AST, ALT, ALKPHOS, BILITOT, PROT, ALBUMIN,  in the last 168 hours No results found for this basename: LIPASE, AMYLASE,  in the last 168 hours No results found for this basename: AMMONIA,  in the last 168 hours CBC:  Recent Labs Lab 03/17/14 2238  WBC 9.2  NEUTROABS 6.3  HGB  12.1  HCT 38.2  MCV 85.5  PLT 355   Cardiac Enzymes:  Recent Labs Lab 03/18/14 0044 03/18/14 0832 03/18/14 1404  TROPONINI <0.30 <0.30 <0.30   BNP: BNP (last 3 results)  Recent Labs  01/31/14 1930 03/17/14 2238  PROBNP 1055.0* 127.9   CBG: No results found for this basename: GLUCAP,  in the last 168 hours     Signed:  Radene Gunning  [facility] 03/19/2014, 1:50 PM

## 2014-03-19 NOTE — Progress Notes (Addendum)
Patient discharged with instructions, prescriptions, and carenotes. The patient was given a scale and CHF packet for discharge.  The patients 02 saturations were checked on room air  With activity and they were 91-92%.  She verbalized understanding via teach back method.    She voiced no complaints or concerns at the time of discharge.The patient left the floor with staff via w/c in stable condition.    Notified April with [facility] and notified them of the patients discharge.  April stated she sent over the message about the discharge and someone would be contacting her.

## 2014-03-20 ENCOUNTER — Emergency Department: Payer: Self-pay | Admitting: Emergency Medicine

## 2014-03-20 LAB — COMPREHENSIVE METABOLIC PANEL
ALBUMIN: 3.9 g/dL (ref 3.4–5.0)
ALK PHOS: 132 U/L — AB
AST: 20 U/L (ref 15–37)
Anion Gap: 4 — ABNORMAL LOW (ref 7–16)
BUN: 26 mg/dL — ABNORMAL HIGH (ref 7–18)
Bilirubin,Total: 0.5 mg/dL (ref 0.2–1.0)
CALCIUM: 8.8 mg/dL (ref 8.5–10.1)
CHLORIDE: 101 mmol/L (ref 98–107)
CREATININE: 1.18 mg/dL (ref 0.60–1.30)
Co2: 33 mmol/L — ABNORMAL HIGH (ref 21–32)
EGFR (African American): 48 — ABNORMAL LOW
EGFR (Non-African Amer.): 41 — ABNORMAL LOW
Glucose: 92 mg/dL (ref 65–99)
Osmolality: 280 (ref 275–301)
POTASSIUM: 4 mmol/L (ref 3.5–5.1)
SGPT (ALT): 13 U/L (ref 12–78)
Sodium: 138 mmol/L (ref 136–145)
TOTAL PROTEIN: 7.4 g/dL (ref 6.4–8.2)

## 2014-03-20 LAB — URINALYSIS, COMPLETE
BACTERIA: NONE SEEN
BLOOD: NEGATIVE
Bilirubin,UR: NEGATIVE
Glucose,UR: NEGATIVE mg/dL (ref 0–75)
Leukocyte Esterase: NEGATIVE
Nitrite: NEGATIVE
Ph: 5 (ref 4.5–8.0)
Protein: NEGATIVE
RBC,UR: 2 /HPF (ref 0–5)
SPECIFIC GRAVITY: 1.015 (ref 1.003–1.030)
Squamous Epithelial: 1

## 2014-03-20 LAB — CBC
HCT: 39.3 % (ref 35.0–47.0)
HGB: 12.7 g/dL (ref 12.0–16.0)
MCH: 27.2 pg (ref 26.0–34.0)
MCHC: 32.2 g/dL (ref 32.0–36.0)
MCV: 84 fL (ref 80–100)
Platelet: 342 10*3/uL (ref 150–440)
RBC: 4.66 10*6/uL (ref 3.80–5.20)
RDW: 14.7 % — ABNORMAL HIGH (ref 11.5–14.5)
WBC: 10.5 10*3/uL (ref 3.6–11.0)

## 2014-03-20 LAB — TROPONIN I: Troponin-I: <0.02 ng/mL

## 2014-03-20 LAB — CK TOTAL AND CKMB (NOT AT ARMC)
CK, TOTAL: 87 U/L
CK-MB: 0.8 ng/mL (ref 0.5–3.6)

## 2014-03-20 LAB — PROTIME-INR
INR: 1
Prothrombin Time: 13.1 secs (ref 11.5–14.7)

## 2014-03-20 LAB — APTT: Activated PTT: 30.1 secs (ref 23.6–35.9)

## 2014-04-02 ENCOUNTER — Other Ambulatory Visit (HOSPITAL_COMMUNITY): Payer: Self-pay | Admitting: Pulmonary Disease

## 2014-04-02 ENCOUNTER — Encounter: Payer: Self-pay | Admitting: Adult Health

## 2014-04-02 ENCOUNTER — Ambulatory Visit (INDEPENDENT_AMBULATORY_CARE_PROVIDER_SITE_OTHER): Payer: Medicare HMO | Admitting: Adult Health

## 2014-04-02 VITALS — BP 136/74 | HR 97 | Ht 62.0 in | Wt 221.0 lb

## 2014-04-02 DIAGNOSIS — M6281 Muscle weakness (generalized): Secondary | ICD-10-CM

## 2014-04-02 DIAGNOSIS — I10 Essential (primary) hypertension: Secondary | ICD-10-CM

## 2014-04-02 DIAGNOSIS — I519 Heart disease, unspecified: Secondary | ICD-10-CM

## 2014-04-02 DIAGNOSIS — R200 Anesthesia of skin: Secondary | ICD-10-CM

## 2014-04-02 DIAGNOSIS — Z139 Encounter for screening, unspecified: Secondary | ICD-10-CM

## 2014-04-02 DIAGNOSIS — R270 Ataxia, unspecified: Secondary | ICD-10-CM

## 2014-04-02 DIAGNOSIS — R531 Weakness: Secondary | ICD-10-CM

## 2014-04-02 DIAGNOSIS — R209 Unspecified disturbances of skin sensation: Secondary | ICD-10-CM

## 2014-04-02 DIAGNOSIS — I5189 Other ill-defined heart diseases: Secondary | ICD-10-CM

## 2014-04-02 DIAGNOSIS — J449 Chronic obstructive pulmonary disease, unspecified: Secondary | ICD-10-CM

## 2014-04-02 DIAGNOSIS — R279 Unspecified lack of coordination: Secondary | ICD-10-CM

## 2014-04-02 NOTE — Assessment & Plan Note (Signed)
She has had admissions x2 for chronic CHF in the setting of diastolic dysfunction. The patient states she fell at home after most recent hospitalization, and has had some focal neuro deficits. She does not appear to be fluid overloaded at this time with no weight change since being discharged, although she is continuing to use salty foods. I discussed with her and her grandson the need to be more compliant concerning low salt diet.

## 2014-04-02 NOTE — Progress Notes (Signed)
Anesthesia Post Note     HPI: Kristie Morris is a 80 year old patient of Dr. Pennelope Bracken we are following for ongoing assessment and management of malignant hypertension, diastolic dysfunction, hyperlipidemia, nonobstructive CAD her cardiac catheterization in 2003, with ongoing history of asthma COPD. The patient was recently seen on consultation during hospitalization in June of 2015 after admission for worsening shortness of breath and exacerbation of diastolic CHF. She was found to be hypertensive during hospitalization.  Echocardiogram completed during hospitalization revealed an LVEF of 55-60% with normal wall motion. Grade 1 diastolic dysfunction. Troponins are found be negative. She was noted to have history of noncompliance with medications. Also dietary noncompliance. On day of discharge the patient was stabilized her weight was 221 pounds.  She comes today with her grandson with multiple somatic complaints. The patient has been seen at [facility] ER after experiencing a fall after returning home on 03/20/2014. During hospitalization in early June the patient states she had been having some tingling on the left side of her face and cheek trouble swallowing with weakness in the left arm and ataxia with the left leg. She states that no one paid attention to this. When she at home and fell again she was seen at [facility], grandson states that a CT scan was completed and found to be negative for acute CVA.  Since that time the patient is followed by OT and PT x1 week. She continues have some tingling in her mouth on the left side and has bitten the inside of her left cheek. She states she is hoarse. She also is still having some tingling and numbness in her left arm. She has not been seen by primary care physician or neurology. She is normally followed by Dr. Luan Pulling and has not been seen him for several years. She has been seen by Dr. Sunday Spillers as well but does now wish to continue with  her.  The patient is confused concerning sequence of events. Her grandson is trying to help her at home by repairing her medications in a daily pillbox. The patient is noncompliant concerning her eating habits, continue needing salty foods, and adding salt to her diet. She is complaining of hoarseness, left breast pain, swelling, generalized fatigue, and overall "feeling bad". She talks at length about all of her symptoms.   Allergies  Allergen Reactions  . Zithromax [Azithromycin] Diarrhea    Pt in hosp for 2 weeks   . Celebrex [Celecoxib] Hives  . Ciprofloxacin Hives  . Penicillins Hives  . Polymyxin B Hives  . Sulfa Antibiotics Hives  . Vancomycin Hives  . Vioxx [Rofecoxib] Hives    Current Outpatient Prescriptions  Medication Sig Dispense Refill  . amLODipine (NORVASC) 2.5 MG tablet Take 2.5 mg by mouth daily.      . Ascorbic Acid (VITAMIN C) 1000 MG tablet Take 1,000 mg by mouth daily.      Marland Kitchen aspirin EC 81 MG tablet Take 81 mg by mouth daily.      . cholecalciferol (VITAMIN D) 1000 UNITS tablet Take 1,000 Units by mouth every morning.      . Cyanocobalamin (VITAMIN B-12) 2500 MCG SUBL Place 1 tablet under the tongue every morning.      . dicyclomine (BENTYL) 20 MG tablet Take 20 mg by mouth daily.      . Fluticasone-Salmeterol (ADVAIR) 250-50 MCG/DOSE AEPB Inhale 1 puff into the lungs 2 (two) times daily.      . furosemide (LASIX) 20 MG tablet Take 20 mg  by mouth as needed.       . multivitamin-lutein (OCUVITE-LUTEIN) CAPS capsule Take 1 capsule by mouth daily.      . Naproxen Sodium 220 MG CAPS Take 440 mg by mouth 2 (two) times daily as needed (pain).       Marland Kitchen omeprazole (PRILOSEC) 20 MG capsule Take 20 mg by mouth daily.      . potassium chloride SA (K-DUR,KLOR-CON) 20 MEQ tablet Take 20 mEq by mouth every other day.       . Probiotic Product (PROBIOTIC PO) Take 1 capsule by mouth daily.      . vitamin E 400 UNIT capsule Take 400 Units by mouth every morning.       No  current facility-administered medications for this visit.    Past Medical History  Diagnosis Date  . Hypertension   . Asthma   . Crohn disease   . Cancer     breast  . COPD (chronic obstructive pulmonary disease)   . Malignant hypertension 02/02/2014  . Diastolic dysfunction [id number]  . Anemia 02/02/2014    Anemia panel WNL  . Hyperlipidemia   . CAD (coronary artery disease) 2003    Cath with nonobstructive CAD    Past Surgical History  Procedure Laterality Date  . Mastectomy    . Back surgery    . Knee surgery    . Abdominal hysterectomy      ROS:  Review of systems complete and found to be negative unless listed above  PHYSICAL EXAM BP 136/74  Pulse 97  Ht 5\' 2"  (1.575 m)  Wt 221 lb (100.245 kg)  BMI 40.41 kg/m2 General: Well developed, obese well nourished, in no acute distress Head: Eyes PERRLA, No xanthomas.   Normal cephalic and atramatic  Lungs: Clear bilaterally to auscultation and percussion. Heart: HRRR S1 S2, without MRG.  Pulses are 2+ & equal.            No carotid bruit. No JVD.  No abdominal bruits. No femoral bruits. Abdomen: Bowel sounds are positive, abdomen soft and non-tender without masses or                  Hernia's noted. Msk:  Back normal, normal gait. Normal strength and tone for age. Extremities: No clubbing, cyanosis or edema.  DP +1 Neuro: Alert and oriented X 3. Some confusion over recent events, neuro evaluation is normal, without focal deficits on my exam. Psych:  Good affect, responds appropriately     ASSESSMENT AND PLAN

## 2014-04-02 NOTE — Patient Instructions (Addendum)
Your physician recommends that you schedule a follow-up appointment in: 3 months   Your physician has recommended you make the following change in your medication:     Decrease Lasix to as needed only  You have been referred to Neurology.You will receive a call from their office   Please have your Mammogram on Monday July 6 th at 1:30 pm here at [facility] out -patient radiology   Please follow low sodium guidelines pamphlet we hav given you    Please see Dr.Hawkins on Tuesday 04/24/2014 at 11:30am

## 2014-04-02 NOTE — Assessment & Plan Note (Addendum)
The patient states that she fell one week after leaving the hospital on 03/20/2014, she also has been having complaints of left-sided numbness tingling in her cheek mouth tongue left arm with difficult gait on the left. She was seen at [facility] on 03/20/2014 at the insistence of her son, apparently had a CT scan and was ruled out for acute CVA. She is being seen by physical therapy and occupational therapy. She has never been seen by a neurologist, and was not referred to 1 by [facility]. I requested records from [facility] hospital for review concerning that ER visit. He is advised to see Dr. Merlene Laughter, neurologist, for further evaluation and management as this appeared to be occurring on and off  I have reviewed ER visit records from [facility], dated 03/20/2014, CT scan was completed which have not no acute abnormality, atrophy and chronic small vessel white matter ischemic changes were found only. EKG revealed normal sinus rhythm, rate 89 beats per minute, labs were within normal limits troponin was negative creatinine 1.18. Chest x-ray revealed no acute abnormality. She is recommended to increase fluid intake. Neurological exam was negative.

## 2014-04-02 NOTE — Progress Notes (Deleted)
Name: Kristie Morris    DOB: 09/08/1925  Age: 80 y.o.  MR#: [id number]       PCP:  [name], MD      Insurance: Payor: [email or web address] MEDICARE / Plan: HU[employer] MEDICARE HMO / Product Type: *No Product type* /   CC:    Chief Complaint  Patient presents with  . Congestive Heart Failure    Diastolic  . Hypertension    VS Filed Vitals:   04/02/14 1340  BP: 136/74  Pulse: 97  Height: 5\' 2"  (1.575 m)  Weight: 221 lb (100.245 kg)    Weights Current Weight  04/02/14 221 lb (100.245 kg)  03/19/14 221 lb 12.8 oz (100.608 kg)  02/14/14 209 lb (94.802 kg)    Blood Pressure  BP Readings from Last 3 Encounters:  04/02/14 136/74  03/19/14 116/50  02/14/14 120/72     Admit date:  (Not on file) Last encounter with RMR:  Visit date not found   Allergy Zithromax; Celebrex; Ciprofloxacin; Penicillins; Polymyxin b; Sulfa antibiotics; Vancomycin; and Vioxx  Current Outpatient Prescriptions  Medication Sig Dispense Refill  . amLODipine (NORVASC) 2.5 MG tablet Take 2.5 mg by mouth daily.      . Ascorbic Acid (VITAMIN C) 1000 MG tablet Take 1,000 mg by mouth daily.      Marland Kitchen aspirin EC 81 MG tablet Take 81 mg by mouth daily.      . cholecalciferol (VITAMIN D) 1000 UNITS tablet Take 1,000 Units by mouth every morning.      . Cyanocobalamin (VITAMIN B-12) 2500 MCG SUBL Place 1 tablet under the tongue every morning.      . dicyclomine (BENTYL) 20 MG tablet Take 20 mg by mouth daily.      . Fluticasone-Salmeterol (ADVAIR) 250-50 MCG/DOSE AEPB Inhale 1 puff into the lungs 2 (two) times daily.      . furosemide (LASIX) 20 MG tablet Take 20 mg by mouth every other day.      . multivitamin-lutein (OCUVITE-LUTEIN) CAPS capsule Take 1 capsule by mouth daily.      . Naproxen Sodium 220 MG CAPS Take 440 mg by mouth 2 (two) times daily as needed (pain).       Marland Kitchen omeprazole (PRILOSEC) 20 MG capsule Take 20 mg by mouth daily.      . potassium chloride SA (K-DUR,KLOR-CON) 20 MEQ tablet Take 20 mEq by mouth every  other day.       . Probiotic Product (PROBIOTIC PO) Take 1 capsule by mouth daily.      . vitamin E 400 UNIT capsule Take 400 Units by mouth every morning.       No current facility-administered medications for this visit.    Discontinued Meds:    Medications Discontinued During This Encounter  Medication Reason  . furosemide (LASIX) 20 MG tablet     Patient Active Problem List   Diagnosis Date Noted  . Diastolic CHF, acute on chronic 03/18/2014  . Acute CHF 03/18/2014  . Malignant hypertension 02/02/2014  . Diastolic dysfunction 99991111  . Asthma, chronic 02/02/2014  . COPD (chronic obstructive pulmonary disease) 02/02/2014  . Anemia 02/02/2014  . Hyperlipidemia 02/02/2014  . Dyspnea 01/31/2014    LABS    Component Value Date/Time   NA 145 03/19/2014 0422   NA 145 03/17/2014 2238   NA 145 02/02/2014 0445   K 3.6* 03/19/2014 0422   K 4.3 03/17/2014 2238   K 3.5* 02/02/2014 0445   CL 100 03/19/2014 0422   CL  104 03/17/2014 2238   CL 101 02/02/2014 0445   CO2 30 03/19/2014 0422   CO2 28 03/17/2014 2238   CO2 29 02/02/2014 0445   GLUCOSE 104* 03/19/2014 0422   GLUCOSE 117* 03/17/2014 2238   GLUCOSE 106* 02/02/2014 0445   BUN 25* 03/19/2014 0422   BUN 17 03/17/2014 2238   BUN 26* 02/02/2014 0445   CREATININE 1.21* 03/19/2014 0422   CREATININE 0.90 03/17/2014 2238   CREATININE 1.07 02/02/2014 0445   CALCIUM 8.9 03/19/2014 0422   CALCIUM 9.5 03/17/2014 2238   CALCIUM 8.7 02/02/2014 0445   GFRNONAA 39* 03/19/2014 0422   GFRNONAA 56* 03/17/2014 2238   GFRNONAA 45* 02/02/2014 0445   GFRAA 45* 03/19/2014 0422   GFRAA 65* 03/17/2014 2238   GFRAA 53* 02/02/2014 0445   CMP     Component Value Date/Time   NA 145 03/19/2014 0422   K 3.6* 03/19/2014 0422   CL 100 03/19/2014 0422   CO2 30 03/19/2014 0422   GLUCOSE 104* 03/19/2014 0422   BUN 25* 03/19/2014 0422   CREATININE 1.21* 03/19/2014 0422   CALCIUM 8.9 03/19/2014 0422   PROT 6.5 02/02/2014 0445   ALBUMIN 3.2* 02/02/2014 0445   AST 9 02/02/2014 0445    ALT 18 02/02/2014 0445   ALKPHOS 124* 02/02/2014 0445   BILITOT 0.5 02/02/2014 0445   GFRNONAA 39* 03/19/2014 0422   GFRAA 45* 03/19/2014 0422       Component Value Date/Time   WBC 9.2 03/17/2014 2238   WBC 13.2* 02/02/2014 0445   WBC 11.1* 02/01/2014 0443   HGB 12.1 03/17/2014 2238   HGB 11.6* 02/02/2014 0445   HGB 9.7* 02/01/2014 0443   HCT 38.2 03/17/2014 2238   HCT 36.0 02/02/2014 0445   HCT 31.2* 02/01/2014 0443   MCV 85.5 03/17/2014 2238   MCV 84.9 02/02/2014 0445   MCV 86.4 02/01/2014 0443    Lipid Panel  No results found for this basename: chol, trig, hdl, cholhdl, vldl, ldlcalc    ABG No results found for this basename: phart, pco2, pco2art, po2, po2art, hco3, tco2, acidbasedef, o2sat     Lab Results  Component Value Date   TSH 1.080 01/31/2014   BNP (last 3 results)  Recent Labs  01/31/14 1930 03/17/14 2238  PROBNP 1055.0* 127.9   Cardiac Panel (last 3 results) No results found for this basename: CKTOTAL, CKMB, TROPONINI, RELINDX,  in the last 72 hours  Iron/TIBC/Ferritin    Component Value Date/Time   IRON 51 02/01/2014 1058   TIBC 408 02/01/2014 1058   FERRITIN 56 02/01/2014 1058     EKG Orders placed during the hospital encounter of 03/17/14  . EKG 12-LEAD  . EKG 12-LEAD  . EKG 12-LEAD  . EKG 12-LEAD  . EKG     Prior Assessment and Plan Problem List as of 04/02/2014     Cardiovascular and Mediastinum   Diastolic CHF, acute on chronic   Acute CHF     Respiratory   Asthma, chronic   COPD (chronic obstructive pulmonary disease)     Other   Malignant hypertension   Diastolic dysfunction   Dyspnea   Anemia   Hyperlipidemia       Imaging: US Venous Img Lower Bilateral  03/19/2014   CLINICAL DATA:  Leg swelling  EXAM: BILATERAL LOWER EXTREMITY VENOUS DOPPLER ULTRASOUND  TECHNIQUE: Gray-scale sonography with graded compression, as well as color Doppler and duplex ultrasound were performed to evaluate the lower extremity deep venous systems from the level  of  the common femoral vein and including the common femoral, femoral, profunda femoral, popliteal and calf veins including the posterior tibial, peroneal and gastrocnemius veins when visible. The superficial great saphenous vein was also interrogated. Spectral Doppler was utilized to evaluate flow at rest and with distal augmentation maneuvers in the common femoral, femoral and popliteal veins.  COMPARISON:  None.  FINDINGS: RIGHT LOWER EXTREMITY  Common Femoral Vein: No evidence of thrombus. Normal compressibility, respiratory phasicity and response to augmentation.  Saphenofemoral Junction: No evidence of thrombus. Normal compressibility and flow on color Doppler imaging.  Profunda Femoral Vein: No evidence of thrombus. Normal compressibility and flow on color Doppler imaging.  Femoral Vein: No evidence of thrombus. Normal compressibility, respiratory phasicity and response to augmentation.  Popliteal Vein: No evidence of thrombus. Normal compressibility, respiratory phasicity and response to augmentation.  Calf Veins: No evidence of thrombus. Normal compressibility and flow on color Doppler imaging.  Superficial Great Saphenous Vein: No evidence of thrombus. Normal compressibility and flow on color Doppler imaging.  Venous Reflux:  None.  Other Findings:  None.  LEFT LOWER EXTREMITY  Common Femoral Vein: No evidence of thrombus. Normal compressibility, respiratory phasicity and response to augmentation.  Saphenofemoral Junction: No evidence of thrombus. Normal compressibility and flow on color Doppler imaging.  Profunda Femoral Vein: No evidence of thrombus. Normal compressibility and flow on color Doppler imaging.  Femoral Vein: No evidence of thrombus. Normal compressibility, respiratory phasicity and response to augmentation.  Popliteal Vein: No evidence of thrombus. Normal compressibility, respiratory phasicity and response to augmentation.  Calf Veins: No evidence of thrombus. Normal compressibility and flow on  color Doppler imaging.  Superficial Great Saphenous Vein: No evidence of thrombus. Normal compressibility and flow on color Doppler imaging.  Venous Reflux:  None.  Other Findings: Subcutaneous edema is noted particularly within the calf  IMPRESSION: No evidence of deep venous thrombosis.   Electronically Signed   By: Inez Catalina M.D.   On: 03/19/2014 14:25   Dg Chest Portable 1 View  03/17/2014   CLINICAL DATA:  Irregular heartbeat. Shortness of breath and weakness.  EXAM: PORTABLE CHEST - 1 VIEW  COMPARISON:  Chest radiograph performed 01/31/2014  FINDINGS: The lungs are well-aerated. Mild vascular congestion is noted. Mild bibasilar atelectasis is seen. There is no evidence of pleural effusion or pneumothorax.  The cardiomediastinal silhouette is borderline normal in size. No acute osseous abnormalities are seen. Scattered clips are seen at both axilla.  IMPRESSION: Mild vascular congestion noted; mild bibasilar atelectasis seen. Lungs otherwise clear.   Electronically Signed   By: Garald Balding M.D.   On: 03/17/2014 23:51

## 2014-04-02 NOTE — Assessment & Plan Note (Signed)
2 recent admissions for uncontrolled hypertension, and also an ER visit with elevated blood pressure at [facility] are pending. The patient has had episode of what appears to have been a hypertensive induced CVA, but he do not have official documentation to support this.  She is now undergoing physical therapy with PT and OT at home. She unfortunately continues to use salty foods. There is concerns also that she has not taking her medications appropriately, and her grandson who is with her is making sure that the medications are placed in daily compartments to make is here for her to take and remember. Currently her blood pressure is controlled but higher than I have seen it during hospitalization in the [id number] systolic. Will not make any changes in her medication regimen at this time, but have counseled her on a low-sodium diet.

## 2014-04-02 NOTE — Assessment & Plan Note (Signed)
Patient continues to have some dyspnea on exertion. She is obese, and known history of COPD. She was followed by Dr. Luan Pulling in the past pulmonologist, but has not seen him in several years. She is referred to Dr. Luan Pulling for ongoing management of COPD and to be reinstituted as her primary care physician. Due to the left breast pain, and lack of mammogram in several years, and gone ahead and ordered a mammogram with copy to go to Dr. Luan Pulling.

## 2014-04-04 ENCOUNTER — Encounter: Payer: Self-pay | Admitting: Adult Health

## 2014-04-09 ENCOUNTER — Ambulatory Visit (HOSPITAL_COMMUNITY): Payer: Medicare HMO

## 2014-04-26 ENCOUNTER — Ambulatory Visit (HOSPITAL_COMMUNITY): Payer: Medicare HMO

## 2014-05-01 ENCOUNTER — Ambulatory Visit: Payer: Medicare HMO | Admitting: Adult Health

## 2014-05-15 ENCOUNTER — Encounter: Payer: Self-pay | Admitting: Cardiovascular Disease

## 2014-05-15 ENCOUNTER — Ambulatory Visit (INDEPENDENT_AMBULATORY_CARE_PROVIDER_SITE_OTHER): Payer: Medicare HMO | Admitting: Cardiovascular Disease

## 2014-05-15 VITALS — BP 130/78 | HR 89 | Ht 62.0 in | Wt 221.0 lb

## 2014-05-15 DIAGNOSIS — I10 Essential (primary) hypertension: Secondary | ICD-10-CM

## 2014-05-15 DIAGNOSIS — I5032 Chronic diastolic (congestive) heart failure: Secondary | ICD-10-CM

## 2014-05-15 DIAGNOSIS — J449 Chronic obstructive pulmonary disease, unspecified: Secondary | ICD-10-CM

## 2014-05-15 MED ORDER — POTASSIUM CHLORIDE CRYS ER 20 MEQ PO TBCR: EXTENDED_RELEASE_TABLET | ORAL | Status: DC

## 2014-05-15 MED ORDER — FUROSEMIDE 20 MG PO TABS: 20.0000 mg | ORAL_TABLET | ORAL | Status: DC | PRN

## 2014-05-15 NOTE — Patient Instructions (Signed)
Your physician recommends that you schedule a follow-up appointment in: 6 months with Dr Bronson Ing. You will receive a reminder letter two months in advance reminding you to call and schedule your appointment. If you don't receive this letter, please contact our office.  Your physician recommends that you continue on your current medications as directed. Please refer to the Current Medication list given to you today.

## 2014-05-15 NOTE — Progress Notes (Signed)
Patient ID: Kristie Morris, female   DOB: 06-08-1925, 80 y.o.   MRN: [id number]      SUBJECTIVE: The patient is an 80 year old woman who presents for followup of chronic diastolic heart failure and malignant hypertension. She was hospitalized this past June for acute on chronic diastolic heart failure secondary to dietary indiscretion. She had another hospitalization at [facility] for a TIA. She has been receiving physical therapy for the past several weeks and her speech has returned to normal. She denies exertional chest pain and dyspnea. She is trying to adhere to a low sodium diet. She denies leg swelling and says that her weight is stable. Her COPD is being managed by pulmonology.   Review of Systems: As per "subjective", otherwise negative.  Allergies  Allergen Reactions  . Zithromax [Azithromycin] Diarrhea    Pt in hosp for 2 weeks   . Celebrex [Celecoxib] Hives  . Ciprofloxacin Hives  . Penicillins Hives  . Polymyxin B Hives  . Sulfa Antibiotics Hives  . Vancomycin Hives  . Vioxx [Rofecoxib] Hives    Current Outpatient Prescriptions  Medication Sig Dispense Refill  . amLODipine (NORVASC) 5 MG tablet Take 5 mg by mouth daily.      . Ascorbic Acid (VITAMIN C) 1000 MG tablet Take 1,000 mg by mouth daily.      Marland Kitchen aspirin EC 81 MG tablet Take 81 mg by mouth daily.      . cholecalciferol (VITAMIN D) 1000 UNITS tablet Take 1,000 Units by mouth every morning.      . Cyanocobalamin (VITAMIN B-12) 2500 MCG SUBL Place 1 tablet under the tongue every morning.      . dicyclomine (BENTYL) 20 MG tablet Take 20 mg by mouth daily.      . Fluticasone-Salmeterol (ADVAIR) 250-50 MCG/DOSE AEPB Inhale 1 puff into the lungs 2 (two) times daily.      . furosemide (LASIX) 20 MG tablet Take 20 mg by mouth as needed.       . multivitamin-lutein (OCUVITE-LUTEIN) CAPS capsule Take 1 capsule by mouth daily.      . Naproxen Sodium 220 MG CAPS Take 440 mg by mouth 2 (two) times daily as needed  (pain).       Marland Kitchen omeprazole (PRILOSEC) 20 MG capsule Take 20 mg by mouth daily.      . Potassium Chloride Crys CR (KLOR-CON M20 PO) Take 20 mEq by mouth. 1/2 tab daily      . Probiotic Product (PROBIOTIC PO) Take 1 capsule by mouth daily.      . vitamin E 400 UNIT capsule Take 400 Units by mouth every morning.       No current facility-administered medications for this visit.    Past Medical History  Diagnosis Date  . Hypertension   . Asthma   . Crohn disease   . Cancer     breast  . COPD (chronic obstructive pulmonary disease)   . Malignant hypertension 02/02/2014  . Diastolic dysfunction [id number]  . Anemia 02/02/2014    Anemia panel WNL  . Hyperlipidemia   . CAD (coronary artery disease) 2003    Cath with nonobstructive CAD    Past Surgical History  Procedure Laterality Date  . Mastectomy    . Back surgery    . Knee surgery    . Abdominal hysterectomy      History   Social History  . Marital Status: Widowed    Spouse Name: N/A    Number of Children: N/A  .  Years of Education: N/A   Occupational History  . Not on file.   Social History Main Topics  . Smoking status: Never Smoker   . Smokeless tobacco: Not on file  . Alcohol Use: No  . Drug Use: No  . Sexual Activity: Not Currently   Other Topics Concern  . Not on file   Social History Narrative  . No narrative on file     Filed Vitals:   05/15/14 0950  BP: 130/78  Pulse: 89  Height: 5\' 2"  (1.575 m)  Weight: 221 lb (100.245 kg)  SpO2: 95%    PHYSICAL EXAM General: NAD Neck: No JVD, no thyromegaly. Lungs: Clear to auscultation bilaterally with normal respiratory effort. CV: Nondisplaced PMI.  Regular rate and rhythm, normal S1/S2, no S3/S4, no murmur. No pretibial or periankle edema.  No carotid bruit.  Normal pedal pulses.  Abdomen: Soft, nontender, no hepatosplenomegaly, no distention.  Neurologic: Alert and oriented x 3.  Psych: Normal affect. Extremities: No clubbing or cyanosis.   ECG:  reviewed and available in electronic records.      ASSESSMENT AND PLAN: 1. Malignant HTN: Controlled on present therapy. 2. Chronic diastolic heart failure: Euvolemic and compensated. Instructed to use Lasix prn for increasing dyspnea and/or leg swelling. 3. COPD: Followed by Dr. Luan Pulling (pulmonology).  Dispo: f/u 6 months.  Kate Sable, M.D., F.A.C.C.

## 2014-06-26 ENCOUNTER — Encounter: Payer: Self-pay | Admitting: *Deleted

## 2014-06-27 ENCOUNTER — Ambulatory Visit (INDEPENDENT_AMBULATORY_CARE_PROVIDER_SITE_OTHER): Payer: Medicare HMO | Admitting: Neurology

## 2014-06-27 ENCOUNTER — Encounter: Payer: Self-pay | Admitting: Neurology

## 2014-06-27 VITALS — BP 179/86 | HR 100 | Ht 62.0 in | Wt 222.0 lb

## 2014-06-27 DIAGNOSIS — R531 Weakness: Secondary | ICD-10-CM

## 2014-06-27 DIAGNOSIS — M6281 Muscle weakness (generalized): Secondary | ICD-10-CM

## 2014-06-27 NOTE — Progress Notes (Signed)
Reason for visit: Stroke  Kristie Morris is a 80 y.o. female  History of present illness:  Kristie Morris is an 80 year old right-handed white female with a history of hypertension who was admitted to the hospital at [facility] on 03/17/2014 with shortness of breath, and she was felt to be in congestive heart failure. The patient indicates that the day before she left the hospital, she had sudden onset of left arm and leg weakness, and some change in her voice with dysphonia, hoarseness of voice. The patient had difficulty walking, and she was dragging her left leg. The patient went home from the hospital, but then apparently fell at home, and was taken to [facility], and she was seen and evaluated through the emergency room. She underwent a CT scan of the head, and she was told that she may have had a stroke event. She was set up for physical, occupational, and speech therapy through a home health nursing agency. The patient seemed improved, but she has never got back to her baseline. She still has some dysphonia with the voice, and she uses thickened liquids at times to take her medications. She continues to have some gait instability, currently using a cane for ambulation, and she is still dragging her left leg. She denies any headaches, but she has had some lability of her blood pressures. She did undergo a 2-D echocardiogram in the hospital. She is on low-dose aspirin. She is sent to this office for an evaluation. She has macular degeneration, with some visual changes associated with this, but no loss of vision around the time of the left-sided weakness onset.  Past Medical History  Diagnosis Date  . Hypertension   . Asthma   . Crohn disease   . Cancer     breast  . COPD (chronic obstructive pulmonary disease)   . Malignant hypertension 02/02/2014  . Diastolic dysfunction [id number]  . Anemia 02/02/2014    Anemia panel WNL  . Hyperlipidemia   . CAD (coronary artery disease) 2003    Cath with  nonobstructive CAD  . Macular degeneration     Bilateral    Past Surgical History  Procedure Laterality Date  . Mastectomy    . Back surgery    . Knee surgery    . Abdominal hysterectomy    . Ovarian cyst removal    . Intraocular lens exchange      Family History  Problem Relation Age of Onset  . Heart attack Mother   . Heart attack Sister   . Cancer - Ovarian Sister   . Cancer - Colon Father     Social history:  reports that she has quit smoking. Her smoking use included Cigarettes. She has a 15 pack-year smoking history. She has never used smokeless tobacco. She reports that she does not drink alcohol or use illicit drugs.  Medications:  Current Outpatient Prescriptions on File Prior to Visit  Medication Sig Dispense Refill  . amLODipine (NORVASC) 5 MG tablet Take 5 mg by mouth daily.      . Ascorbic Acid (VITAMIN C) 1000 MG tablet Take 1,000 mg by mouth daily.      Marland Kitchen aspirin EC 81 MG tablet Take 81 mg by mouth daily.      . cholecalciferol (VITAMIN D) 1000 UNITS tablet Take 1,000 Units by mouth every morning.      . Cyanocobalamin (VITAMIN B-12) 2500 MCG SUBL Place 1 tablet under the tongue every morning.      Marland Kitchen  dicyclomine (BENTYL) 20 MG tablet Take 20 mg by mouth daily.      . Fluticasone-Salmeterol (ADVAIR) 250-50 MCG/DOSE AEPB Inhale 1 puff into the lungs 2 (two) times daily.      . furosemide (LASIX) 20 MG tablet Take 1 tablet (20 mg total) by mouth as needed. With potassium  30 tablet  3  . multivitamin-lutein (OCUVITE-LUTEIN) CAPS capsule Take 1 capsule by mouth daily.      . Naproxen Sodium 220 MG CAPS Take 440 mg by mouth 2 (two) times daily as needed (pain).       . potassium chloride SA (KLOR-CON M20) 20 MEQ tablet Take with Lasix as needed  30 tablet  3  . Probiotic Product (PROBIOTIC PO) Take 1 capsule by mouth daily.      . vitamin E 400 UNIT capsule Take 400 Units by mouth every morning.       No current facility-administered medications on file prior to  visit.      Allergies  Allergen Reactions  . Zithromax [Azithromycin] Diarrhea    Pt in hosp for 2 weeks   . Celebrex [Celecoxib] Hives  . Ciprofloxacin Hives  . Penicillins Hives  . Polymyxin B Hives  . Sulfa Antibiotics Hives  . Vancomycin Hives  . Vioxx [Rofecoxib] Hives  . Metronidazole     ROS:  Out of a complete 14 system review of symptoms, the patient complains only of the following symptoms, and all other reviewed systems are negative.  Fatigue Heart murmur, swelling in the legs Difficulty swallowing Loss of vision, eye pain Shortness of breath, cough, wheezing Easy bruising Joint pain, joint swelling, achy muscles Allergies Headache, weakness, difficulty swallowing Not enough sleep, decreased energy Sleepiness, restless legs  Blood pressure 179/86, pulse 100, height 5\' 2"  (1.575 m), weight 222 lb (100.699 kg).  Physical Exam  General: The patient is alert and cooperative at the time of the examination. The patient is moderately obese.  Eyes: Pupils are equal, round, and reactive to light. Discs are flat bilaterally.  Neck: The neck is supple, no carotid bruits are noted.  Respiratory: The respiratory examination is clear.  Cardiovascular: The cardiovascular examination reveals a regular rate and rhythm, no obvious murmurs or rubs are noted.  Skin: Extremities are with 2-3+ edema below the knees bilaterally.  Neurologic Exam  Mental status: The patient is alert and oriented x 3 at the time of the examination. The patient has apparent normal recent and remote memory, with an apparently normal attention span and concentration ability.  Cranial nerves: Facial symmetry is present. There is good sensation of the face to pinprick and soft touch bilaterally. The strength of the facial muscles and the muscles to head turning and shoulder shrug are normal bilaterally. Speech is dysphonic, hoarse. No aphasia is noted. Extraocular movements are full. Visual fields  are full. The tongue is midline, and the patient has symmetric elevation of the soft palate. No obvious hearing deficits are noted.  Motor: The motor testing reveals 5 over 5 strength of all 4 extremities, with exception that there is 4/5 strength with flexion at the hip on the left. Good symmetric motor tone is noted throughout.  Sensory: Sensory testing is intact to pinprick, soft touch, vibration sensation, and position sense on all 4 extremities. No evidence of extinction is noted.  Coordination: Cerebellar testing reveals good finger-nose-finger and heel-to-shin bilaterally, with exception that the patient has difficulty performing heel-to-shin on the left leg secondary to proximal leg weakness.  Gait and  station: Gait is minimally wide-based, a mild circumduction gait is seen with the left leg. Tandem gait was not attempted. The patient walks with a cane. Romberg is negative. No drift is seen.  Reflexes: Deep tendon reflexes are symmetric, but are depressed bilaterally. Toes are downgoing bilaterally.   2 D echo 03/18/14:  Study Conclusions  - Left ventricle: The cavity size was normal. There was mild concentric hypertrophy. Systolic function was normal. The estimated ejection fraction was in the range of 55% to 60%. Wall motion was normal; there were no regional wall motion abnormalities. Doppler parameters are consistent with abnormal left ventricular relaxation (grade 1 diastolic dysfunction). - Aortic valve: Valve area (Vmax): 2.3 cm^2. - Left atrium: The atrium was mildly dilated. - Pericardium, extracardiac: A small pericardial effusion was identified circumferential to the heart.     Assessment/Plan:  1. Probable stroke event, subcortical  2. Hypertension  3. History of congestive heart failure  The patient has had an event that likely represents a small vessel, subcortical stroke event. The patient has had some residual dysphonia, and some left-sided weakness and a  gait disorder. The patient has undergone a 2-D echocardiogram evaluation in June 2015. The patient will be set up for a carotid Doppler study, and MRI evaluation of the brain. She will followup through this office in about 2-3 months.  Jill Alexanders MD 06/27/2014 8:52 PM  [facility] [address] [place], [place] [address]  Phone [phone] Fax [phone]

## 2014-06-27 NOTE — Patient Instructions (Signed)
Stroke Prevention Some medical conditions and behaviors are associated with an increased chance of having a stroke. You may prevent a stroke by making healthy choices and managing medical conditions. HOW CAN I REDUCE MY RISK OF HAVING A STROKE?   Stay physically active. Get at least 30 minutes of activity on most or all days.  Do not smoke. It may also be helpful to avoid exposure to secondhand smoke.  Limit alcohol use. Moderate alcohol use is considered to be:  No more than 2 drinks per day for men.  No more than 1 drink per day for nonpregnant women.  Eat healthy foods. This involves:  Eating 5 or more servings of fruits and vegetables a day.  Making dietary changes that address high blood pressure (hypertension), high cholesterol, diabetes, or obesity.  Manage your cholesterol levels.  Making food choices that are high in fiber and low in saturated fat, trans fat, and cholesterol may control cholesterol levels.  Take any prescribed medicines to control cholesterol as directed by your health care provider.  Manage your diabetes.  Controlling your carbohydrate and sugar intake is recommended to manage diabetes.  Take any prescribed medicines to control diabetes as directed by your health care provider.  Control your hypertension.  Making food choices that are low in salt (sodium), saturated fat, trans fat, and cholesterol is recommended to manage hypertension.  Take any prescribed medicines to control hypertension as directed by your health care provider.  Maintain a healthy weight.  Reducing calorie intake and making food choices that are low in sodium, saturated fat, trans fat, and cholesterol are recommended to manage weight.  Stop drug abuse.  Avoid taking birth control pills.  Talk to your health care provider about the risks of taking birth control pills if you are over 35 years old, smoke, get migraines, or have ever had a blood clot.  Get evaluated for sleep  disorders (sleep apnea).  Talk to your health care provider about getting a sleep evaluation if you snore a lot or have excessive sleepiness.  Take medicines only as directed by your health care provider.  For some people, aspirin or blood thinners (anticoagulants) are helpful in reducing the risk of forming abnormal blood clots that can lead to stroke. If you have the irregular heart rhythm of atrial fibrillation, you should be on a blood thinner unless there is a good reason you cannot take them.  Understand all your medicine instructions.  Make sure that other conditions (such as anemia or atherosclerosis) are addressed. SEEK IMMEDIATE MEDICAL CARE IF:   You have sudden weakness or numbness of the face, arm, or leg, especially on one side of the body.  Your face or eyelid droops to one side.  You have sudden confusion.  You have trouble speaking (aphasia) or understanding.  You have sudden trouble seeing in one or both eyes.  You have sudden trouble walking.  You have dizziness.  You have a loss of balance or coordination.  You have a sudden, severe headache with no known cause.  You have new chest pain or an irregular heartbeat. Any of these symptoms may represent a serious problem that is an emergency. Do not wait to see if the symptoms will go away. Get medical help at once. Call your local emergency services (911 in [place] Do not drive yourself to the hospital. Document Released: 10/29/2004 Document Revised: 02/05/2014 Document Reviewed: 03/24/2013 [employer] Patient Information 2015 [employer]. This information is not intended to replace advice given   to you by your health care provider. Make sure you discuss any questions you have with your health care provider.  

## 2014-06-28 ENCOUNTER — Ambulatory Visit (INDEPENDENT_AMBULATORY_CARE_PROVIDER_SITE_OTHER): Payer: Commercial Managed Care - HMO | Admitting: Adult Health

## 2014-06-28 ENCOUNTER — Encounter: Payer: Self-pay | Admitting: Adult Health

## 2014-06-28 VITALS — BP 162/100 | HR 72 | Ht 64.0 in | Wt 222.0 lb

## 2014-06-28 DIAGNOSIS — I519 Heart disease, unspecified: Secondary | ICD-10-CM

## 2014-06-28 DIAGNOSIS — R531 Weakness: Secondary | ICD-10-CM

## 2014-06-28 DIAGNOSIS — I5189 Other ill-defined heart diseases: Secondary | ICD-10-CM

## 2014-06-28 DIAGNOSIS — I10 Essential (primary) hypertension: Secondary | ICD-10-CM

## 2014-06-28 DIAGNOSIS — M6281 Muscle weakness (generalized): Secondary | ICD-10-CM

## 2014-06-28 MED ORDER — AMLODIPINE BESYLATE 5 MG PO TABS: ORAL_TABLET | ORAL | Status: DC

## 2014-06-28 MED ORDER — FUROSEMIDE 20 MG PO TABS: 20.0000 mg | ORAL_TABLET | ORAL | Status: DC

## 2014-06-28 NOTE — Assessment & Plan Note (Signed)
I will  check a CBC to evaluate for anemia causing issues with fluid retention.

## 2014-06-28 NOTE — Assessment & Plan Note (Signed)
She has a history of a hypertensive TIA. She is being followed by [facility], and is planned for carotid Doppler studies and an MRI of her brain. She is followed by Dr. Jannifer [place]

## 2014-06-28 NOTE — Progress Notes (Signed)
HPI: Kristie Morris is a 80 year old patient of Dr. Dwana Curd that we follow for ongoing assistance in management of chronic diastolic heart failure and malignant hypertension. She was last seen  by Dr. Bronson Ing in August of 2015,  after being admitted to [facility] for a TIA, and diastolic heart failure, secondary to dietary indiscretion. She is being followed by [facility] for ongoing evaluation of her TIA.  She comes today as an add-on to discuss " blood pressure issues."  Grandson and her blood pressure recordings. She started her blood pressure is elevated in the morning and [id number] to [id number] systolic. She takes amlodipine had a couple hours later he checks her blood pressure and is found to be more normal in the 120s to 130s. However, in the evening time. Her blood pressure rises again in the 150s to 160 range. She has had a history of a hypertensive TIA, and she is concerned that this is going to happen again. She admits to some dietary noncompliance concerning salt. She will one time was taking Lasix every other day, but this was cut back to when necessary and she is feeling swollen and bloated in her abdomen although she has not gained any weight.  Allergies  Allergen Reactions  . Zithromax [Azithromycin] Diarrhea    Pt in hosp for 2 weeks   . Celebrex [Celecoxib] Hives  . Ciprofloxacin Hives  . Penicillins Hives  . Polymyxin B Hives  . Sulfa Antibiotics Hives  . Vancomycin Hives  . Vioxx [Rofecoxib] Hives  . Metronidazole     Current Outpatient Prescriptions  Medication Sig Dispense Refill  . Ascorbic Acid (VITAMIN C) 1000 MG tablet Take 1,000 mg by mouth daily.      Marland Kitchen aspirin EC 81 MG tablet Take 81 mg by mouth daily.      . cholecalciferol (VITAMIN D) 1000 UNITS tablet Take 1,000 Units by mouth every morning.      . Cyanocobalamin (VITAMIN B-12) 2500 MCG SUBL Place 1 tablet under the tongue every morning.      . dicyclomine (BENTYL) 20 MG  tablet Take 20 mg by mouth daily.      . Fluticasone-Salmeterol (ADVAIR) 250-50 MCG/DOSE AEPB Inhale 1 puff into the lungs 2 (two) times daily.      . furosemide (LASIX) 20 MG tablet Take 1 tablet (20 mg total) by mouth 3 (three) times a week. Monday Wednesday Friday With potassium  30 tablet  3  . lansoprazole (PREVACID) 15 MG capsule Take 15 mg by mouth daily at 12 noon.      . multivitamin-lutein (OCUVITE-LUTEIN) CAPS capsule Take 1 capsule by mouth daily.      . Naproxen Sodium 220 MG CAPS Take 440 mg by mouth 2 (two) times daily as needed (pain).       . potassium chloride SA (KLOR-CON M20) 20 MEQ tablet Take with Lasix as needed  30 tablet  3  . Probiotic Product (PROBIOTIC PO) Take 1 capsule by mouth daily.      . vitamin E 400 UNIT capsule Take 400 Units by mouth every morning.      Marland Kitchen amLODipine (NORVASC) 5 MG tablet Take 5 mg AM and 5 mg PM  60 tablet  3   No current facility-administered medications for this visit.    Past Medical History  Diagnosis Date  . Hypertension   . Asthma   . Crohn disease   . Cancer     breast  . COPD (chronic  obstructive pulmonary disease)   . Malignant hypertension 02/02/2014  . Diastolic dysfunction [id number]  . Anemia 02/02/2014    Anemia panel WNL  . Hyperlipidemia   . CAD (coronary artery disease) 2003    Cath with nonobstructive CAD  . Macular degeneration     Bilateral    Past Surgical History  Procedure Laterality Date  . Mastectomy    . Back surgery    . Knee surgery    . Abdominal hysterectomy    . Ovarian cyst removal    . Intraocular lens exchange      ROS: Review of systems complete and found to be negative unless listed above  PHYSICAL EXAM BP 162/100  Pulse 72  Ht 5\' 4"  (1.626 m)  Wt 222 lb (100.699 kg)  BMI 38.09 kg/m2 General: Well developed, well nourished, in no acute distress Head: Eyes PERRLA, No xanthomas.   Normal cephalic and atramatic  Lungs: Clear bilaterally to auscultation and percussion. Heart: HRRR S1  S2, distant heart sounds unable to auscultate S3 without MRG.  Pulses are 2+ & equal.            No carotid bruit. No JVD.  No abdominal bruits. No femoral bruits. Abdomen: Bowel sounds are positive, abdomen soft and non-tender without masses or                  Hernia's noted. Msk:  Back normal, normal gait. Normal strength and tone for age. Extremities: No clubbing, cyanosis or edema.  DP +1 Neuro: Alert and oriented X 3. Psych:  Good affect, responds appropriatel  ASSESSMENT AND PLAN

## 2014-06-28 NOTE — Assessment & Plan Note (Signed)
Review of her blood pressure recordings are clear, that she is hypertensive in the morning prior to taking her meds. She does not appear to be overtly edematous, it is subjectively feeling bloated. She denies constipation, but she does have some diarrhea.  I will increase amlodipine to 5 mg twice a day. Have also asked her to take Lasix every other day on Monday, Wednesdays, and Fridays. She is to be more diligent on her salt restriction.  I will check a CBC with history of anemia, and a BMET to evaluate kidney function. She will keep track of her blood pressures over the next 2 weeks. I have given her recording sheet. We will see her in a couple weeks, to discuss her BP results and labs.

## 2014-06-28 NOTE — Progress Notes (Deleted)
Name: Kristie Morris    DOB: 02/28/1925  Age: 80 y.o.  MR#: [id number]       PCP:  Alonza Bogus, MD      Insurance: Payor: [employer] MEDICARE / Plan: [employer] / Product Type: *No Product type* /   CC:    Chief Complaint  Patient presents with  . Congestive Heart Failure  . Hypertension    VS Filed Vitals:   06/28/14 1455  BP: 162/100  Pulse: 72  Height: 5\' 4"  (1.626 m)  Weight: 222 lb (100.699 kg)    Weights Current Weight  06/28/14 222 lb (100.699 kg)  06/27/14 222 lb (100.699 kg)  05/15/14 221 lb (100.245 kg)    Blood Pressure  BP Readings from Last 3 Encounters:  06/28/14 162/100  06/27/14 179/86  05/15/14 130/78     Admit date:  (Not on file) Last encounter with RMR:  04/02/2014   Allergy Zithromax; Celebrex; Ciprofloxacin; Penicillins; Polymyxin b; Sulfa antibiotics; Vancomycin; Vioxx; and Metronidazole  Current Outpatient Prescriptions  Medication Sig Dispense Refill  . amLODipine (NORVASC) 5 MG tablet Take 5 mg by mouth daily.      . Ascorbic Acid (VITAMIN C) 1000 MG tablet Take 1,000 mg by mouth daily.      Marland Kitchen aspirin EC 81 MG tablet Take 81 mg by mouth daily.      . cholecalciferol (VITAMIN D) 1000 UNITS tablet Take 1,000 Units by mouth every morning.      . Cyanocobalamin (VITAMIN B-12) 2500 MCG SUBL Place 1 tablet under the tongue every morning.      . dicyclomine (BENTYL) 20 MG tablet Take 20 mg by mouth daily.      . Fluticasone-Salmeterol (ADVAIR) 250-50 MCG/DOSE AEPB Inhale 1 puff into the lungs 2 (two) times daily.      . furosemide (LASIX) 20 MG tablet Take 1 tablet (20 mg total) by mouth as needed. With potassium  30 tablet  3  . lansoprazole (PREVACID) 15 MG capsule Take 15 mg by mouth daily at 12 noon.      . multivitamin-lutein (OCUVITE-LUTEIN) CAPS capsule Take 1 capsule by mouth daily.      . Naproxen Sodium 220 MG CAPS Take 440 mg by mouth 2 (two) times daily as needed (pain).       . potassium chloride SA (KLOR-CON M20) 20 MEQ tablet  Take with Lasix as needed  30 tablet  3  . Probiotic Product (PROBIOTIC PO) Take 1 capsule by mouth daily.      . vitamin E 400 UNIT capsule Take 400 Units by mouth every morning.       No current facility-administered medications for this visit.    Discontinued Meds:   There are no discontinued medications.  Patient Active Problem List   Diagnosis Date Noted  . Left-sided weakness 04/02/2014  . Malignant hypertension 02/02/2014  . Diastolic dysfunction 99991111  . Asthma, chronic 02/02/2014  . COPD (chronic obstructive pulmonary disease) 02/02/2014  . Anemia 02/02/2014  . Hyperlipidemia 02/02/2014  . Dyspnea 01/31/2014    LABS    Component Value Date/Time   NA 145 03/19/2014 0422   NA 145 03/17/2014 2238   NA 145 02/02/2014 0445   K 3.6* 03/19/2014 0422   K 4.3 03/17/2014 2238   K 3.5* 02/02/2014 0445   CL 100 03/19/2014 0422   CL 104 03/17/2014 2238   CL 101 02/02/2014 0445   CO2 30 03/19/2014 0422   CO2 28 03/17/2014 2238   CO2  29 02/02/2014 0445   GLUCOSE 104* 03/19/2014 0422   GLUCOSE 117* 03/17/2014 2238   GLUCOSE 106* 02/02/2014 0445   BUN 25* 03/19/2014 0422   BUN 17 03/17/2014 2238   BUN 26* 02/02/2014 0445   CREATININE 1.21* 03/19/2014 0422   CREATININE 0.90 03/17/2014 2238   CREATININE 1.07 02/02/2014 0445   CALCIUM 8.9 03/19/2014 0422   CALCIUM 9.5 03/17/2014 2238   CALCIUM 8.7 02/02/2014 0445   GFRNONAA 39* 03/19/2014 0422   GFRNONAA 56* 03/17/2014 2238   GFRNONAA 45* 02/02/2014 0445   GFRAA 45* 03/19/2014 0422   GFRAA 65* 03/17/2014 2238   GFRAA 53* 02/02/2014 0445   CMP     Component Value Date/Time   NA 145 03/19/2014 0422   K 3.6* 03/19/2014 0422   CL 100 03/19/2014 0422   CO2 30 03/19/2014 0422   GLUCOSE 104* 03/19/2014 0422   BUN 25* 03/19/2014 0422   CREATININE 1.21* 03/19/2014 0422   CALCIUM 8.9 03/19/2014 0422   PROT 6.5 02/02/2014 0445   ALBUMIN 3.2* 02/02/2014 0445   AST 9 02/02/2014 0445   ALT 18 02/02/2014 0445   ALKPHOS 124* 02/02/2014 0445   BILITOT 0.5 02/02/2014 0445    GFRNONAA 39* 03/19/2014 0422   GFRAA 45* 03/19/2014 0422       Component Value Date/Time   WBC 9.2 03/17/2014 2238   WBC 13.2* 02/02/2014 0445   WBC 11.1* 02/01/2014 0443   HGB 12.1 03/17/2014 2238   HGB 11.6* 02/02/2014 0445   HGB 9.7* 02/01/2014 0443   HCT 38.2 03/17/2014 2238   HCT 36.0 02/02/2014 0445   HCT 31.2* 02/01/2014 0443   MCV 85.5 03/17/2014 2238   MCV 84.9 02/02/2014 0445   MCV 86.4 02/01/2014 0443    Lipid Panel  No results found for this basename: chol, trig, hdl, cholhdl, vldl, ldlcalc, ldldirect    ABG No results found for this basename: phart, pco2, pco2art, po2, po2art, hco3, tco2, acidbasedef, o2sat     Lab Results  Component Value Date   TSH 1.080 01/31/2014   BNP (last 3 results)  Recent Labs  01/31/14 1930 03/17/14 2238  PROBNP 1055.0* 127.9   Cardiac Panel (last 3 results) No results found for this basename: CKTOTAL, CKMB, TROPONINI, RELINDX,  in the last 72 hours  Iron/TIBC/Ferritin/ %Sat    Component Value Date/Time   IRON 51 02/01/2014 1058   TIBC 408 02/01/2014 1058   FERRITIN 56 02/01/2014 1058   IRONPCTSAT 13* 02/01/2014 1058     EKG Orders placed during the hospital encounter of 03/17/14  . EKG 12-LEAD  . EKG 12-LEAD  . EKG 12-LEAD  . EKG 12-LEAD  . EKG     Prior Assessment and Plan Problem List as of 06/28/2014     Cardiovascular and Mediastinum   Malignant hypertension   Last Assessment & Plan   04/02/2014 Office Visit Written 04/02/2014  4:21 PM by Lendon Colonel, NP     2 recent admissions for uncontrolled hypertension, and also an ER visit with elevated blood pressure at [facility]. Records are pending. The patient has had episode of what appears to have been a hypertensive induced CVA, but he do not have official documentation to support this.  She is now undergoing physical therapy with PT and OT at home. She unfortunately continues to use salty foods. There is concerns also that she has not taking her medications  appropriately, and her grandson who is with her is making sure that the medications  are placed in daily compartments to make is here for her to take and remember. Currently her blood pressure is controlled but higher than I have seen it during hospitalization in the [id number] systolic. Will not make any changes in her medication regimen at this time, but have counseled her on a low-sodium diet.      Respiratory   Asthma, chronic   COPD (chronic obstructive pulmonary disease)   Last Assessment & Plan   04/02/2014 Office Visit Written 04/02/2014  4:24 PM by Lendon Colonel, NP     Patient continues to have some dyspnea on exertion. She is obese, and known history of COPD. She was followed by Dr. Luan Pulling in the past pulmonologist, but has not seen him in several years. She is referred to Dr. Luan Pulling for ongoing management of COPD and to be reinstituted as her primary care physician. Due to the left breast pain, and lack of mammogram in several years, and gone ahead and ordered a mammogram with copy to go to Dr. Luan Pulling.       Nervous and Auditory   Left-sided weakness   Last Assessment & Plan   04/02/2014 Office Visit Edited 04/03/2014  9:36 AM by Lendon Colonel, NP     The patient states that she fell one week after leaving the hospital on 03/20/2014, she also has been having complaints of left-sided numbness tingling in her cheek mouth tongue left arm with difficult gait on the left. She was seen at [facility] on 03/20/2014 at the insistence of her son, apparently had a CT scan and was ruled out for acute CVA. She is being seen by physical therapy and occupational therapy. She has never been seen by a neurologist, and was not referred to 1 by [place]. I requested records from [facility] hospital for review concerning that ER visit. He is advised to see Dr. Merlene Laughter, neurologist, for further evaluation and management as this appeared to be occurring on and off  I have reviewed ER visit  records from [facility], dated 03/20/2014, CT scan was completed which have not no acute abnormality, atrophy and chronic small vessel white matter ischemic changes were found only. EKG revealed normal sinus rhythm, rate 89 beats per minute, labs were within normal limits troponin was negative creatinine 1.18. Chest x-ray revealed no acute abnormality. She is recommended to increase fluid intake. Neurological exam was negative.      Other   Diastolic dysfunction   Last Assessment & Plan   04/02/2014 Office Visit Written 04/02/2014  4:22 PM by Lendon Colonel, NP     She has had admissions x2 for chronic CHF in the setting of diastolic dysfunction. The patient states she fell at home after most recent hospitalization, and has had some focal neuro deficits. She does not appear to be fluid overloaded at this time with no weight change since being discharged, although she is continuing to use salty foods. I discussed with her and her grandson the need to be more compliant concerning low salt diet.    Dyspnea   Anemia   Hyperlipidemia       Imaging: No results found.

## 2014-06-28 NOTE — Patient Instructions (Signed)
Your physician recommends that you schedule a follow-up appointment in: 3 weeks  Your physician has requested that you regularly monitor and record your blood pressure readings at home. Please use the same machine at the same time of day to check your readings and record them to bring to your follow-up visit.  Your physician has recommended you make the following change in your medication:   TAKE LASIX ON MONDAY, WEDNESDAYS, FRIDAYS  TAKE POTASSIUM WHEN YOU TAKE YOUR LASIX  INCREASE YOUR AMLODIPINE TO 5 MG TWICE DAILY. ONCE IN THE MORNING AND ONCE AT NIGHT  Your physician recommends that you return for lab work before your next visit. BMP/CBC  Thank you for choosing [facility]!!

## 2014-06-28 NOTE — Assessment & Plan Note (Signed)
Repeating the echo at this time. This is believed to be caused by uncontrolled hypertension. Continue her pressure better controlled as our main goal.

## 2014-06-29 LAB — CBC WITH DIFFERENTIAL/PLATELET
BASOS ABS: 0 10*3/uL (ref 0.0–0.1)
Basophils Relative: 0 % (ref 0–1)
EOS PCT: 4 % (ref 0–5)
Eosinophils Absolute: 0.3 10*3/uL (ref 0.0–0.7)
HCT: 38 % (ref 36.0–46.0)
Hemoglobin: 12.2 g/dL (ref 12.0–15.0)
LYMPHS PCT: 22 % (ref 12–46)
Lymphs Abs: 1.9 10*3/uL (ref 0.7–4.0)
MCH: 25.6 pg — AB (ref 26.0–34.0)
MCHC: 32.1 g/dL (ref 30.0–36.0)
MCV: 79.8 fL (ref 78.0–100.0)
MONO ABS: 0.7 10*3/uL (ref 0.1–1.0)
Monocytes Relative: 8 % (ref 3–12)
Neutro Abs: 5.7 10*3/uL (ref 1.7–7.7)
Neutrophils Relative %: 66 % (ref 43–77)
Platelets: 388 10*3/uL (ref 150–400)
RBC: 4.76 MIL/uL (ref 3.87–5.11)
RDW: 15.8 % — ABNORMAL HIGH (ref 11.5–15.5)
WBC: 8.6 10*3/uL (ref 4.0–10.5)

## 2014-06-29 LAB — BASIC METABOLIC PANEL WITH GFR
BUN: 23 mg/dL (ref 6–23)
CO2: 28 mEq/L (ref 19–32)
CREATININE: 0.92 mg/dL (ref 0.50–1.10)
Calcium: 9.2 mg/dL (ref 8.4–10.5)
Chloride: 105 mEq/L (ref 96–112)
GFR, EST AFRICAN AMERICAN: 65 mL/min
GFR, Est Non African American: 56 mL/min — ABNORMAL LOW
Glucose, Bld: 85 mg/dL (ref 70–99)
POTASSIUM: 4.8 meq/L (ref 3.5–5.3)
SODIUM: 143 meq/L (ref 135–145)

## 2014-07-04 ENCOUNTER — Ambulatory Visit (INDEPENDENT_AMBULATORY_CARE_PROVIDER_SITE_OTHER): Payer: Medicare HMO

## 2014-07-04 DIAGNOSIS — R269 Unspecified abnormalities of gait and mobility: Secondary | ICD-10-CM

## 2014-07-04 DIAGNOSIS — R531 Weakness: Secondary | ICD-10-CM

## 2014-07-06 ENCOUNTER — Telehealth: Payer: Self-pay | Admitting: Neurology

## 2014-07-06 NOTE — Telephone Encounter (Signed)
I called patient. The carotid Doppler study was unremarkable. MRI of the brain is pending.

## 2014-07-11 ENCOUNTER — Ambulatory Visit
Admission: RE | Admit: 2014-07-11 | Discharge: 2014-07-11 | Disposition: A | Discharge status: Home / Self Care | Payer: Commercial Managed Care - HMO | Source: Ambulatory Visit | Attending: Neurology | Admitting: Neurology

## 2014-07-11 DIAGNOSIS — R531 Weakness: Secondary | ICD-10-CM

## 2014-07-11 DIAGNOSIS — M6289 Other specified disorders of muscle: Secondary | ICD-10-CM

## 2014-07-13 ENCOUNTER — Telehealth: Payer: Self-pay | Admitting: Neurology

## 2014-07-13 DIAGNOSIS — R269 Unspecified abnormalities of gait and mobility: Secondary | ICD-10-CM

## 2014-07-13 NOTE — Telephone Encounter (Signed)
I called patient. The MRI the brain shows what looks like very chronic infarction in the right corona radiata. These strokes in this area appeared to be cystic. The patient may have suffered a small infarct in the medullary that was missed by the MRI. The patient is still having a lot of troubles with driving her left leg. The carotid Doppler study was unremarkable. I'll set patient up for physical therapy as an outpatient, the patient did get home health physical therapy that ended in July, but she has ongoing issues with walking.   MRI brain 07/13/2014:  Impression   Abnormal MRI brain (without) demonstrating: 1. Chronic lacunar ischemic infarction in the right coronal radiata.  2. Mild chronic small vessel ischemic disease.  3. No acute findings.

## 2014-07-20 ENCOUNTER — Other Ambulatory Visit: Payer: Self-pay

## 2014-07-23 ENCOUNTER — Ambulatory Visit: Payer: Medicare HMO | Admitting: Adult Health

## 2014-07-27 ENCOUNTER — Encounter: Payer: Self-pay | Admitting: *Deleted

## 2014-07-30 ENCOUNTER — Telehealth: Payer: Self-pay | Admitting: Neurology

## 2014-07-30 NOTE — Telephone Encounter (Signed)
Patient stated Physical Therapy order needs to be sent to [facility] in [place], phone # [phone]. Due to patient doesn't drive and has to depend on grandson.

## 2014-08-02 NOTE — Telephone Encounter (Signed)
Referral given to [facility] for scheduling at [facility] in [place].

## 2014-09-14 ENCOUNTER — Encounter: Payer: Self-pay | Admitting: Cardiovascular Disease

## 2014-09-14 ENCOUNTER — Ambulatory Visit (INDEPENDENT_AMBULATORY_CARE_PROVIDER_SITE_OTHER): Payer: Commercial Managed Care - HMO | Admitting: Cardiovascular Disease

## 2014-09-14 VITALS — BP 110/70 | HR 70 | Ht 64.0 in | Wt 223.0 lb

## 2014-09-14 DIAGNOSIS — I10 Essential (primary) hypertension: Secondary | ICD-10-CM

## 2014-09-14 DIAGNOSIS — I868 Varicose veins of other specified sites: Secondary | ICD-10-CM

## 2014-09-14 DIAGNOSIS — M25472 Effusion, left ankle: Secondary | ICD-10-CM

## 2014-09-14 DIAGNOSIS — I8390 Asymptomatic varicose veins of unspecified lower extremity: Secondary | ICD-10-CM

## 2014-09-14 DIAGNOSIS — I5032 Chronic diastolic (congestive) heart failure: Secondary | ICD-10-CM

## 2014-09-14 MED ORDER — FUROSEMIDE 20 MG PO TABS: ORAL_TABLET | ORAL | Status: DC

## 2014-09-14 MED ORDER — AMLODIPINE BESYLATE 5 MG PO TABS: ORAL_TABLET | ORAL | Status: DC

## 2014-09-14 NOTE — Patient Instructions (Signed)
Your physician wants you to follow-up in: 6 months You will receive a reminder letter in the mail two months in advance. If you don't receive a letter, please call our office to schedule the follow-up appointment.      Your physician has recommended you make the following change in your medication:    INCREASE Lasix to Monday thru Friday with the potassium     Please get compression stockings fitted and wear them as directed     Thank you for choosing [facility] !

## 2014-09-14 NOTE — Progress Notes (Signed)
Patient ID: Kristie Morris, female   DOB: 1925-07-26, 80 y.o.   MRN: [id number]      SUBJECTIVE: The patient presents for follow-up of chronic diastolic heart failure and malignant hypertension. Wt today 223 lbs (221 on 8/11, 222 on 9/24). CBC and BMET on 9/24 were unremarkable. Her legs have been more swollen this past week, left moreso than the right which has been common for her ever since her CVA. She has been recording her BP's, and most have been within acceptable limits for her age. She denies chest pain and dyspnea.   Review of Systems: As per "subjective", otherwise negative.  Allergies  Allergen Reactions  . Zithromax [Azithromycin] Diarrhea    Pt in hosp for 2 weeks   . Celebrex [Celecoxib] Hives  . Ciprofloxacin Hives  . Penicillins Hives  . Polymyxin B Hives  . Sulfa Antibiotics Hives  . Vancomycin Hives  . Vioxx [Rofecoxib] Hives  . Metronidazole     Current Outpatient Prescriptions  Medication Sig Dispense Refill  . amLODipine (NORVASC) 5 MG tablet Take 5 mg AM and 5 mg PM 60 tablet 3  . Ascorbic Acid (VITAMIN C) 1000 MG tablet Take 1,000 mg by mouth daily.    Marland Kitchen aspirin EC 81 MG tablet Take 81 mg by mouth daily.    . cholecalciferol (VITAMIN D) 1000 UNITS tablet Take 1,000 Units by mouth every morning.    . Cyanocobalamin (VITAMIN B-12) 2500 MCG SUBL Place 1 tablet under the tongue every morning.    . dicyclomine (BENTYL) 20 MG tablet Take 20 mg by mouth daily.    . Fluticasone-Salmeterol (ADVAIR) 250-50 MCG/DOSE AEPB Inhale 1 puff into the lungs 2 (two) times daily.    . furosemide (LASIX) 20 MG tablet Take 1 tablet (20 mg total) by mouth 3 (three) times a week. Monday Wednesday Friday With potassium 30 tablet 3  . ibuprofen (ADVIL,MOTRIN) 800 MG tablet     . lansoprazole (PREVACID) 15 MG capsule Take 15 mg by mouth daily at 12 noon.    . mirabegron ER (MYRBETRIQ) 25 MG TB24 tablet Take 25 mg by mouth daily.    . multivitamin-lutein (OCUVITE-LUTEIN) CAPS  capsule Take 1 capsule by mouth daily.    . Naproxen Sodium 220 MG CAPS Take 440 mg by mouth 2 (two) times daily as needed (pain).     . potassium chloride SA (KLOR-CON M20) 20 MEQ tablet Take with Lasix as needed 30 tablet 3  . Probiotic Product (PROBIOTIC PO) Take 1 capsule by mouth daily.    . vitamin E 400 UNIT capsule Take 400 Units by mouth every morning.     No current facility-administered medications for this visit.    Past Medical History  Diagnosis Date  . Hypertension   . Asthma   . Crohn disease   . Cancer     breast  . COPD (chronic obstructive pulmonary disease)   . Malignant hypertension 02/02/2014  . Diastolic dysfunction [id number]  . Anemia 02/02/2014    Anemia panel WNL  . Hyperlipidemia   . CAD (coronary artery disease) 2003    Cath with nonobstructive CAD  . Macular degeneration     Bilateral    Past Surgical History  Procedure Laterality Date  . Mastectomy    . Back surgery    . Knee surgery    . Abdominal hysterectomy    . Ovarian cyst removal    . Intraocular lens exchange      History   Social  History  . Marital Status: Widowed    Spouse Name: N/A    Number of Children: 2  . Years of Education: 16   Occupational History  . RETIRED   . ACCOUNTANT    Social History Main Topics  . Smoking status: Former Smoker -- 1.00 packs/day for 15 years    Types: Cigarettes  . Smokeless tobacco: Never Used     Comment: QUIT 1986  . Alcohol Use: No  . Drug Use: No  . Sexual Activity: Not Currently   Other Topics Concern  . Not on file   Social History Narrative     Filed Vitals:   09/14/14 0901  BP: 110/70  Pulse: 70  Height: 5\' 4"  (1.626 m)  Weight: 223 lb (101.152 kg)    PHYSICAL EXAM General: NAD HEENT: Normal. Neck: No JVD, no thyromegaly. Lungs: Clear to auscultation bilaterally with normal respiratory effort. CV: Nondisplaced PMI.  Regular rate and rhythm, normal S1/S2, no S3/S4, no murmur. Mild left periankle edema with  bilateral venous varicosities, left > right.    Abdomen: Soft, obese, no distention.  Neurologic: Alert and oriented.  Psych: Normal affect. Skin: Normal. Musculoskeletal: No gross deformities. Extremities: No clubbing or cyanosis.   ECG: Most recent ECG reviewed.      ASSESSMENT AND PLAN: 1. Malignant HTN: Controlled on present therapy. No changes to current regimen, which includes amlodipine 5 mg bid. 2. Chronic diastolic heart failure: Euvolemic and compensated for the most part with minimal left periankle swelling, primarily related to venous insufficiency. I will have her take Lasix 20 mg daily along with supplemental KCl. I will also prescribe tailor-made knee-high compression stockings 20-30 mmHg. 3. COPD: Followed by Dr. Luan Pulling (pulmonology).  Dispo: f/u 6 months.   Kate Sable, M.D., F.A.C.C.

## 2014-10-31 ENCOUNTER — Ambulatory Visit: Payer: Medicare HMO | Admitting: Neurology

## 2014-11-06 DIAGNOSIS — I6350 Cerebral infarction due to unspecified occlusion or stenosis of unspecified cerebral artery: Secondary | ICD-10-CM | POA: Diagnosis not present

## 2014-11-06 DIAGNOSIS — I10 Essential (primary) hypertension: Secondary | ICD-10-CM | POA: Diagnosis not present

## 2014-11-06 DIAGNOSIS — J449 Chronic obstructive pulmonary disease, unspecified: Secondary | ICD-10-CM | POA: Diagnosis not present

## 2014-11-29 DIAGNOSIS — C50912 Malignant neoplasm of unspecified site of left female breast: Secondary | ICD-10-CM | POA: Diagnosis not present

## 2014-12-07 DIAGNOSIS — H3532 Exudative age-related macular degeneration: Secondary | ICD-10-CM | POA: Diagnosis not present

## 2014-12-13 ENCOUNTER — Emergency Department (HOSPITAL_COMMUNITY)
Admission: EM | Admit: 2014-12-13 | Discharge: 2014-12-13 | Disposition: A | Discharge status: Home / Self Care | Payer: Commercial Managed Care - HMO | Attending: Emergency Medicine | Admitting: Emergency Medicine

## 2014-12-13 ENCOUNTER — Encounter (HOSPITAL_COMMUNITY): Payer: Self-pay | Admitting: Emergency Medicine

## 2014-12-13 ENCOUNTER — Emergency Department (HOSPITAL_COMMUNITY): Payer: Commercial Managed Care - HMO

## 2014-12-13 DIAGNOSIS — I2510 Atherosclerotic heart disease of native coronary artery without angina pectoris: Secondary | ICD-10-CM | POA: Insufficient documentation

## 2014-12-13 DIAGNOSIS — W109XXA Fall (on) (from) unspecified stairs and steps, initial encounter: Secondary | ICD-10-CM | POA: Insufficient documentation

## 2014-12-13 DIAGNOSIS — Z853 Personal history of malignant neoplasm of breast: Secondary | ICD-10-CM | POA: Insufficient documentation

## 2014-12-13 DIAGNOSIS — Z79899 Other long term (current) drug therapy: Secondary | ICD-10-CM | POA: Insufficient documentation

## 2014-12-13 DIAGNOSIS — I10 Essential (primary) hypertension: Secondary | ICD-10-CM | POA: Diagnosis not present

## 2014-12-13 DIAGNOSIS — S92352A Displaced fracture of fifth metatarsal bone, left foot, initial encounter for closed fracture: Secondary | ICD-10-CM | POA: Diagnosis not present

## 2014-12-13 DIAGNOSIS — Y998 Other external cause status: Secondary | ICD-10-CM | POA: Insufficient documentation

## 2014-12-13 DIAGNOSIS — Y9289 Other specified places as the place of occurrence of the external cause: Secondary | ICD-10-CM | POA: Insufficient documentation

## 2014-12-13 DIAGNOSIS — Z880 Allergy status to penicillin: Secondary | ICD-10-CM | POA: Diagnosis not present

## 2014-12-13 DIAGNOSIS — Z8719 Personal history of other diseases of the digestive system: Secondary | ICD-10-CM | POA: Insufficient documentation

## 2014-12-13 DIAGNOSIS — Z862 Personal history of diseases of the blood and blood-forming organs and certain disorders involving the immune mechanism: Secondary | ICD-10-CM | POA: Insufficient documentation

## 2014-12-13 DIAGNOSIS — S99922A Unspecified injury of left foot, initial encounter: Secondary | ICD-10-CM | POA: Diagnosis present

## 2014-12-13 DIAGNOSIS — Z87891 Personal history of nicotine dependence: Secondary | ICD-10-CM | POA: Diagnosis not present

## 2014-12-13 DIAGNOSIS — Z7982 Long term (current) use of aspirin: Secondary | ICD-10-CM | POA: Diagnosis not present

## 2014-12-13 DIAGNOSIS — S62309A Unspecified fracture of unspecified metacarpal bone, initial encounter for closed fracture: Secondary | ICD-10-CM

## 2014-12-13 DIAGNOSIS — J449 Chronic obstructive pulmonary disease, unspecified: Secondary | ICD-10-CM | POA: Insufficient documentation

## 2014-12-13 DIAGNOSIS — Y9389 Activity, other specified: Secondary | ICD-10-CM | POA: Insufficient documentation

## 2014-12-13 DIAGNOSIS — W19XXXA Unspecified fall, initial encounter: Secondary | ICD-10-CM

## 2014-12-13 MED ORDER — TRAMADOL HCL 50 MG PO TABS: 50.0000 mg | ORAL_TABLET | Freq: Four times a day (QID) | ORAL | Status: DC | PRN

## 2014-12-13 NOTE — ED Notes (Signed)
Pt reports slipped and fell down approx 6 steps.  C/O pain to top of left foot.  Denies  Any head or neck pain.

## 2014-12-13 NOTE — Discharge Instructions (Signed)
Use ice on the sore area 3 or 4 times a day for 3 days. Wear the walking boot, when you are ambulating. Call the orthopedic doctor for a follow-up appointment.    Fall Prevention and Home Safety Falls cause injuries and can affect all age groups. It is possible to use preventive measures to significantly decrease the likelihood of falls. There are many simple measures which can make your home safer and prevent falls. OUTDOORS  Repair cracks and edges of walkways and driveways.  Remove high doorway thresholds.  Trim shrubbery on the main path into your home.  Have good outside lighting.  Clear walkways of tools, rocks, debris, and clutter.  Check that handrails are not broken and are securely fastened. Both sides of steps should have handrails.  Have leaves, snow, and ice cleared regularly.  Use sand or salt on walkways during winter months.  In the garage, clean up grease or oil spills. BATHROOM  Install night lights.  Install grab bars by the toilet and in the tub and shower.  Use non-skid mats or decals in the tub or shower.  Place a plastic non-slip stool in the shower to sit on, if needed.  Keep floors dry and clean up all water on the floor immediately.  Remove soap buildup in the tub or shower on a regular basis.  Secure bath mats with non-slip, double-sided rug tape.  Remove throw rugs and tripping hazards from the floors. BEDROOMS  Install night lights.  Make sure a bedside light is easy to reach.  Do not use oversized bedding.  Keep a telephone by your bedside.  Have a firm chair with side arms to use for getting dressed.  Remove throw rugs and tripping hazards from the floor. KITCHEN  Keep handles on pots and pans turned toward the center of the stove. Use back burners when possible.  Clean up spills quickly and allow time for drying.  Avoid walking on wet floors.  Avoid hot utensils and knives.  Position shelves so they are not too high or  low.  Place commonly used objects within easy reach.  If necessary, use a sturdy step stool with a grab bar when reaching.  Keep electrical cables out of the way.  Do not use floor polish or wax that makes floors slippery. If you must use wax, use non-skid floor wax.  Remove throw rugs and tripping hazards from the floor. STAIRWAYS  Never leave objects on stairs.  Place handrails on both sides of stairways and use them. Fix any loose handrails. Make sure handrails on both sides of the stairways are as long as the stairs.  Check carpeting to make sure it is firmly attached along stairs. Make repairs to worn or loose carpet promptly.  Avoid placing throw rugs at the top or bottom of stairways, or properly secure the rug with carpet tape to prevent slippage. Get rid of throw rugs, if possible.  Have an electrician put in a light switch at the top and bottom of the stairs. OTHER FALL PREVENTION TIPS  Wear low-heel or rubber-soled shoes that are supportive and fit well. Wear closed toe shoes.  When using a stepladder, make sure it is fully opened and both spreaders are firmly locked. Do not climb a closed stepladder.  Add color or contrast paint or tape to grab bars and handrails in your home. Place contrasting color strips on first and last steps.  Learn and use mobility aids as needed. Install an electrical emergency response  system.  Turn on lights to avoid dark areas. Replace light bulbs that burn out immediately. Get light switches that glow.  Arrange furniture to create clear pathways. Keep furniture in the same place.  Firmly attach carpet with non-skid or double-sided tape.  Eliminate uneven floor surfaces.  Select a carpet pattern that does not visually hide the edge of steps.  Be aware of all pets. OTHER HOME SAFETY TIPS  Set the water temperature for 120 F (48.8 C).  Keep emergency numbers on or near the telephone.  Keep smoke detectors on every level of the  home and near sleeping areas. Document Released: 09/11/2002 Document Revised: 03/22/2012 Document Reviewed: 12/11/2011 [facility] Patient Information 2015 [place], [place]. This information is not intended to replace advice given to you by your health care provider. Make sure you discuss any questions you have with your health care provider.  Metacarpal Fracture   The metacarpal bones are in the middle of the hand, connecting the fingers to the wrist. A metacarpal fracture is a break in one of these bones. It is common for an injury of the hand to break one or more of these bones. A metacarpal fracture of the fifth (little) finger, near the knuckle, is also known as a boxer's fracture. SYMPTOMS   Severe pain at the time of injury.  Pain, tenderness, swelling (especially the back of the hand).  Bruising of the hand within 48 hours.  Visible deformity, if the fracture out of alignment (displaced).  Numbness or paralysis from swelling in the hand, causing pressure on the blood vessels or nerves (uncommon). CAUSES   Direct hit (trauma) to the hand, such as a striking blow with the fist.  Indirect stress to the hand, such as twisting or violent muscle contraction (uncommon). RISK INCREASES WITH:  Contact sports (football, rugby, soccer).  Sports that require hitting (boxing, martial arts).  History of bone or joint disease, including osteoporosis.  Poor hand strength and flexibility. PREVENTION  Maintain proper conditioning:  Hand and finger strength.  Flexibility and endurance.  For contact sports, wear properly fitted and padded protective equipment for the hand.  Learn and use proper technique when hitting, punching, and landing from a fall. PROGNOSIS If treated properly, metacarpal fractures can be expected to heal within 4 to 6 weeks. For severe injuries, surgery may be needed. RELATED COMPLICATIONS   Fracture does not heal (nonunion).  Heals in a poor position, including  twisted fingers (malunion).  Chronic pain, stiffness, or swelling of the hand.  Excessive bleeding in the hand, causing pressure and injury to nerves and blood vessels (rare).  Unstable or arthritic joint, following repeated injury or delayed treatment.  Hindrance of normal hand growth in children.  Infection in open fractures (skin broken over fracture) or at the incision or pin sites, if surgery was performed.  Shortening or injured bones.  Bony bump (spur) or loss of shape of the knuckles. TREATMENT  Treatment will vary, depending on the extent of the injury. First, ice and medicine will help reduce pain and inflammation. For a single metacarpal fracture that is not displaced and does not involve the joint, restraint is usually sufficient for healing to occur. Multiple metacarpal fractures, fractures that are displaced, or fractures involving the joint may require surgery. Surgery often involves placing pins and screws in the bones, to hold them in place. Restraint of the injury follows surgery, to allow for healing. After restraint (with or without surgery), stretching and strengthening exercises may be needed to  regain strength and a full range of motion. Exercises may be done at home or with a therapist. Sometimes, depending on the sport and position, a brace or splint may be needed when first returning to sports. MEDICATION   Do not take pain medicine for 7 days before surgery.  Only take over-the-counter or prescription medicines for pain, fever, or discomfort as directed by your caregiver.  Prescription pain medicines are usually prescribed only after surgery. Use only as directed and only as much as you need. COLD THERAPY  Cold treatment (icing) should be applied for 10 to 15 minutes every 2 to 3 hours for inflammation and pain, and immediately after activity that aggravates your symptoms. Use ice packs or an ice massage. SEEK IMMEDIATE MEDICAL CARE IF:   Pain, tenderness, or  swelling gets worse even with treatment.  You have pain, numbness, or coldness in the hand.  Blue, gray, or dark color appears in the fingernails.  Any of the following occur after surgery:  You have an oral temperature above 102 F (38.9 C), not controlled by medicine.  You have increased pain, swelling, redness, drainage of fluids, or bleeding in the affected area.  New, unexplained symptoms develop. (Drugs used in treatment may produce side effects.) Document Released: 10/05/1998 Document Revised: 12/14/2011 Document Reviewed: 01/03/2009 [facility] Patient Information 2015 [place], [place]. This information is not intended to replace advice given to you by your health care provider. Make sure you discuss any questions you have with your health care provider.

## 2014-12-13 NOTE — ED Provider Notes (Signed)
CSN: [id number]     Arrival date & time 12/13/14  1347 History   First MD Initiated Contact with Patient 12/13/14 1740     Chief Complaint  Patient presents with  . Fall     (Consider location/radiation/quality/duration/timing/severity/associated sxs/prior Treatment) Patient is a 81 y.o. female presenting with fall. The history is provided by the patient and a relative.  Fall   She fell while walking outside, and double down several steps, landing on her buttocks, then her head. He did not lose consciousness. She was able to walk afterwards. She complains only of pain in the left foot. She denies headache, neck pain, back pain, nausea, vomiting, weakness or dizziness. She's not been ill recently. She believes that she fell because she took a misstep. There are no other known modifying factors.  Past Medical History  Diagnosis Date  . Hypertension   . Asthma   . Crohn disease   . Cancer     breast  . COPD (chronic obstructive pulmonary disease)   . Malignant hypertension 02/02/2014  . Diastolic dysfunction [id number]  . Anemia 02/02/2014    Anemia panel WNL  . Hyperlipidemia   . CAD (coronary artery disease) 2003    Cath with nonobstructive CAD  . Macular degeneration     Bilateral   Past Surgical History  Procedure Laterality Date  . Mastectomy    . Back surgery    . Knee surgery    . Abdominal hysterectomy    . Ovarian cyst removal    . Intraocular lens exchange     Family History  Problem Relation Age of Onset  . Heart attack Mother   . Heart attack Sister   . Cancer - Ovarian Sister   . Cancer - Colon Father    History  Substance Use Topics  . Smoking status: Former Smoker -- 1.00 packs/day for 15 years    Types: Cigarettes  . Smokeless tobacco: Never Used     Comment: QUIT 1986  . Alcohol Use: No   OB History    No data available     Review of Systems  All other systems reviewed and are negative.     Allergies  Zithromax; Celebrex; Ciprofloxacin;  Penicillins; Polymyxin b; Sulfa antibiotics; Vancomycin; Vioxx; and Metronidazole  Home Medications   Prior to Admission medications   Medication Sig Start Date End Date Taking? Authorizing Provider  amLODipine (NORVASC) 5 MG tablet Take 5 mg AM and 5 mg PM 09/14/14   Herminio Commons, MD  Ascorbic Acid (VITAMIN C) 1000 MG tablet Take 1,000 mg by mouth daily.    Historical Provider, MD  aspirin EC 81 MG tablet Take 81 mg by mouth daily.    Historical Provider, MD  cholecalciferol (VITAMIN D) 1000 UNITS tablet Take 1,000 Units by mouth every morning.    Historical Provider, MD  Cyanocobalamin (VITAMIN B-12) 2500 MCG SUBL Place 1 tablet under the tongue every morning.    Historical Provider, MD  dicyclomine (BENTYL) 20 MG tablet Take 20 mg by mouth daily.    Historical Provider, MD  Fluticasone-Salmeterol (ADVAIR) 250-50 MCG/DOSE AEPB Inhale 1 puff into the lungs 2 (two) times daily.    Historical Provider, MD  furosemide (LASIX) 20 MG tablet Take Lasix 20 mg daily Monday thru Friday with Potassium 09/14/14   Herminio Commons, MD  ibuprofen (ADVIL,MOTRIN) 800 MG tablet  07/10/14   Historical Provider, MD  lansoprazole (PREVACID) 15 MG capsule Take 15 mg by mouth daily at 12  noon.    Historical Provider, MD  mirabegron ER (MYRBETRIQ) 25 MG TB24 tablet Take 25 mg by mouth daily.    Historical Provider, MD  multivitamin-lutein (OCUVITE-LUTEIN) CAPS capsule Take 1 capsule by mouth daily.    Historical Provider, MD  Naproxen Sodium 220 MG CAPS Take 440 mg by mouth 2 (two) times daily as needed (pain).     Historical Provider, MD  potassium chloride SA (KLOR-CON M20) 20 MEQ tablet Take with Lasix as needed 05/15/14   Herminio Commons, MD  Probiotic Product (PROBIOTIC PO) Take 1 capsule by mouth daily.    Historical Provider, MD  traMADol (ULTRAM) 50 MG tablet Take 1 tablet (50 mg total) by mouth every 6 (six) hours as needed. 12/13/14   Daleen Bo, MD  vitamin E 400 UNIT capsule Take 400 Units  by mouth every morning.    Historical Provider, MD   BP 150/83 mmHg  Pulse 99  Temp(Src) 98 F (36.7 C) (Oral)  Resp 22  Ht 5\' 3"  (1.6 m)  Wt 212 lb (96.163 kg)  BMI 37.56 kg/m2  SpO2 95% Physical Exam  Constitutional: She is oriented to person, place, and time. She appears well-developed and well-nourished.  HENT:  Head: Normocephalic and atraumatic.  Right Ear: External ear normal.  Left Ear: External ear normal.  No cranial deformity, abrasion or laceration.  Eyes: Conjunctivae and EOM are normal. Pupils are equal, round, and reactive to light.  Neck: Normal range of motion and phonation normal. Neck supple.  Cardiovascular: Normal rate.   Pulmonary/Chest: Effort normal and breath sounds normal. She exhibits no bony tenderness.  Abdominal: Soft. There is no tenderness.  Musculoskeletal: Normal range of motion.  Tender and swollen left lateral ankle, and left lateral midfoot. No crepitation or deformity.  Neurological: She is alert and oriented to person, place, and time. No cranial nerve deficit or sensory deficit. She exhibits normal muscle tone. Coordination normal.  Skin: Skin is warm, dry and intact.  Psychiatric: She has a normal mood and affect. Her behavior is normal. Judgment and thought content normal.  Nursing note and vitals reviewed.   ED Course  Procedures (including critical care time)  Cam walker ordered  Findings discussed with patient, and grandson, who is here with her.  Labs Review Labs Reviewed - No data to display  Imaging Review Dg Foot Complete Left  12/13/2014   CLINICAL DATA:  Left foot pain post fall last night  EXAM: LEFT FOOT - COMPLETE 3+ VIEW  COMPARISON:  None.  FINDINGS: Three views of the left foot submitted. There is oblique mild displaced fracture of the fifth metatarsal. No radiopaque foreign body.  IMPRESSION: Oblique mild displaced fracture of fifth metatarsal. No radiopaque foreign body.   Electronically Signed   By: Lahoma Crocker M.D.    On: 12/13/2014 15:10     EKG Interpretation None      MDM   Final diagnoses:  Fall, initial encounter  Fracture, metacarpal, closed, initial encounter    Uncomplicated MC fracture, isolated injury from fall.   Nursing Notes Reviewed/ Care Coordinated Applicable Imaging Reviewed Interpretation of Laboratory Data incorporated into ED treatment  The patient appears reasonably screened and/or stabilized for discharge and I doubt any other medical condition or other [facility] requiring further screening, evaluation, or treatment in the ED at this time prior to discharge.  Plan: Home Medications- Tramadol; Home Treatments- Cam Walker when up; return here if the recommended treatment, does not improve the symptoms; Recommended follow up-  Ortho 1 week for definitive care     Daleen Bo, MD 12/14/14 1348

## 2014-12-20 DIAGNOSIS — S92352A Displaced fracture of fifth metatarsal bone, left foot, initial encounter for closed fracture: Secondary | ICD-10-CM | POA: Diagnosis not present

## 2014-12-20 DIAGNOSIS — Z8719 Personal history of other diseases of the digestive system: Secondary | ICD-10-CM | POA: Diagnosis not present

## 2014-12-20 DIAGNOSIS — I6930 Unspecified sequelae of cerebral infarction: Secondary | ICD-10-CM | POA: Diagnosis not present

## 2014-12-20 DIAGNOSIS — I509 Heart failure, unspecified: Secondary | ICD-10-CM | POA: Diagnosis not present

## 2014-12-20 DIAGNOSIS — D649 Anemia, unspecified: Secondary | ICD-10-CM | POA: Diagnosis not present

## 2014-12-20 DIAGNOSIS — J449 Chronic obstructive pulmonary disease, unspecified: Secondary | ICD-10-CM | POA: Diagnosis not present

## 2014-12-20 DIAGNOSIS — I10 Essential (primary) hypertension: Secondary | ICD-10-CM | POA: Diagnosis not present

## 2014-12-20 DIAGNOSIS — Z8709 Personal history of other diseases of the respiratory system: Secondary | ICD-10-CM | POA: Diagnosis not present

## 2014-12-21 ENCOUNTER — Ambulatory Visit: Payer: Commercial Managed Care - HMO | Admitting: Urology

## 2014-12-26 DIAGNOSIS — I10 Essential (primary) hypertension: Secondary | ICD-10-CM | POA: Diagnosis not present

## 2014-12-26 DIAGNOSIS — H6123 Impacted cerumen, bilateral: Secondary | ICD-10-CM | POA: Diagnosis not present

## 2014-12-26 DIAGNOSIS — I6350 Cerebral infarction due to unspecified occlusion or stenosis of unspecified cerebral artery: Secondary | ICD-10-CM | POA: Diagnosis not present

## 2015-01-02 DIAGNOSIS — J449 Chronic obstructive pulmonary disease, unspecified: Secondary | ICD-10-CM | POA: Diagnosis not present

## 2015-01-02 DIAGNOSIS — N3281 Overactive bladder: Secondary | ICD-10-CM | POA: Diagnosis not present

## 2015-01-02 DIAGNOSIS — I951 Orthostatic hypotension: Secondary | ICD-10-CM | POA: Diagnosis not present

## 2015-01-02 DIAGNOSIS — I10 Essential (primary) hypertension: Secondary | ICD-10-CM | POA: Diagnosis not present

## 2015-01-08 ENCOUNTER — Emergency Department (HOSPITAL_COMMUNITY): Payer: Commercial Managed Care - HMO

## 2015-01-08 ENCOUNTER — Encounter (HOSPITAL_COMMUNITY): Payer: Self-pay | Admitting: General Practice

## 2015-01-08 ENCOUNTER — Encounter (HOSPITAL_COMMUNITY): Payer: Self-pay | Admitting: Emergency Medicine

## 2015-01-08 ENCOUNTER — Telehealth: Payer: Self-pay | Admitting: Neurology

## 2015-01-08 ENCOUNTER — Observation Stay (HOSPITAL_COMMUNITY)
Admission: EM | Admit: 2015-01-08 | Discharge: 2015-01-10 | Disposition: A | Discharge status: Home / Self Care | Payer: Commercial Managed Care - HMO | Attending: Internal Medicine | Admitting: Internal Medicine

## 2015-01-08 ENCOUNTER — Observation Stay (HOSPITAL_COMMUNITY): Payer: Commercial Managed Care - HMO

## 2015-01-08 ENCOUNTER — Encounter: Payer: Self-pay | Admitting: Neurology

## 2015-01-08 DIAGNOSIS — Z880 Allergy status to penicillin: Secondary | ICD-10-CM | POA: Diagnosis not present

## 2015-01-08 DIAGNOSIS — Z79899 Other long term (current) drug therapy: Secondary | ICD-10-CM | POA: Insufficient documentation

## 2015-01-08 DIAGNOSIS — S0990XA Unspecified injury of head, initial encounter: Secondary | ICD-10-CM | POA: Diagnosis not present

## 2015-01-08 DIAGNOSIS — R269 Unspecified abnormalities of gait and mobility: Secondary | ICD-10-CM

## 2015-01-08 DIAGNOSIS — H35343 Macular cyst, hole, or pseudohole, bilateral: Secondary | ICD-10-CM | POA: Diagnosis not present

## 2015-01-08 DIAGNOSIS — I10 Essential (primary) hypertension: Secondary | ICD-10-CM | POA: Insufficient documentation

## 2015-01-08 DIAGNOSIS — Z8701 Personal history of pneumonia (recurrent): Secondary | ICD-10-CM | POA: Diagnosis not present

## 2015-01-08 DIAGNOSIS — G8929 Other chronic pain: Secondary | ICD-10-CM | POA: Insufficient documentation

## 2015-01-08 DIAGNOSIS — S3992XA Unspecified injury of lower back, initial encounter: Secondary | ICD-10-CM | POA: Diagnosis not present

## 2015-01-08 DIAGNOSIS — E43 Unspecified severe protein-calorie malnutrition: Secondary | ICD-10-CM | POA: Insufficient documentation

## 2015-01-08 DIAGNOSIS — Z87891 Personal history of nicotine dependence: Secondary | ICD-10-CM | POA: Insufficient documentation

## 2015-01-08 DIAGNOSIS — Z8781 Personal history of (healed) traumatic fracture: Secondary | ICD-10-CM | POA: Insufficient documentation

## 2015-01-08 DIAGNOSIS — R0602 Shortness of breath: Secondary | ICD-10-CM | POA: Diagnosis not present

## 2015-01-08 DIAGNOSIS — M6281 Muscle weakness (generalized): Secondary | ICD-10-CM | POA: Diagnosis not present

## 2015-01-08 DIAGNOSIS — J449 Chronic obstructive pulmonary disease, unspecified: Secondary | ICD-10-CM | POA: Insufficient documentation

## 2015-01-08 DIAGNOSIS — R42 Dizziness and giddiness: Secondary | ICD-10-CM | POA: Diagnosis not present

## 2015-01-08 DIAGNOSIS — R011 Cardiac murmur, unspecified: Secondary | ICD-10-CM | POA: Diagnosis not present

## 2015-01-08 DIAGNOSIS — R51 Headache: Secondary | ICD-10-CM | POA: Diagnosis not present

## 2015-01-08 DIAGNOSIS — G43909 Migraine, unspecified, not intractable, without status migrainosus: Secondary | ICD-10-CM | POA: Insufficient documentation

## 2015-01-08 DIAGNOSIS — I69998 Other sequelae following unspecified cerebrovascular disease: Secondary | ICD-10-CM | POA: Diagnosis not present

## 2015-01-08 DIAGNOSIS — K219 Gastro-esophageal reflux disease without esophagitis: Secondary | ICD-10-CM | POA: Diagnosis not present

## 2015-01-08 DIAGNOSIS — M1990 Unspecified osteoarthritis, unspecified site: Secondary | ICD-10-CM | POA: Insufficient documentation

## 2015-01-08 DIAGNOSIS — W19XXXA Unspecified fall, initial encounter: Secondary | ICD-10-CM | POA: Diagnosis not present

## 2015-01-08 DIAGNOSIS — Z9889 Other specified postprocedural states: Secondary | ICD-10-CM | POA: Insufficient documentation

## 2015-01-08 DIAGNOSIS — I2510 Atherosclerotic heart disease of native coronary artery without angina pectoris: Secondary | ICD-10-CM | POA: Diagnosis not present

## 2015-01-08 DIAGNOSIS — S199XXA Unspecified injury of neck, initial encounter: Secondary | ICD-10-CM | POA: Diagnosis not present

## 2015-01-08 DIAGNOSIS — D649 Anemia, unspecified: Secondary | ICD-10-CM | POA: Diagnosis not present

## 2015-01-08 DIAGNOSIS — Z853 Personal history of malignant neoplasm of breast: Secondary | ICD-10-CM | POA: Diagnosis not present

## 2015-01-08 DIAGNOSIS — K5090 Crohn's disease, unspecified, without complications: Secondary | ICD-10-CM | POA: Insufficient documentation

## 2015-01-08 DIAGNOSIS — M545 Low back pain: Secondary | ICD-10-CM | POA: Diagnosis not present

## 2015-01-08 DIAGNOSIS — R296 Repeated falls: Secondary | ICD-10-CM | POA: Diagnosis not present

## 2015-01-08 DIAGNOSIS — Z7982 Long term (current) use of aspirin: Secondary | ICD-10-CM | POA: Insufficient documentation

## 2015-01-08 DIAGNOSIS — F419 Anxiety disorder, unspecified: Secondary | ICD-10-CM | POA: Diagnosis not present

## 2015-01-08 DIAGNOSIS — Z7951 Long term (current) use of inhaled steroids: Secondary | ICD-10-CM | POA: Insufficient documentation

## 2015-01-08 DIAGNOSIS — R05 Cough: Secondary | ICD-10-CM | POA: Diagnosis not present

## 2015-01-08 DIAGNOSIS — R55 Syncope and collapse: Secondary | ICD-10-CM

## 2015-01-08 HISTORY — DX: Low back pain: M54.5

## 2015-01-08 HISTORY — DX: Fibromyalgia: M79.7

## 2015-01-08 HISTORY — DX: Anxiety disorder, unspecified: F41.9

## 2015-01-08 HISTORY — DX: Low back pain, unspecified: M54.50

## 2015-01-08 HISTORY — DX: Other chronic pain: G89.29

## 2015-01-08 HISTORY — DX: Cardiac murmur, unspecified: R01.1

## 2015-01-08 HISTORY — DX: Gastro-esophageal reflux disease without esophagitis: K21.9

## 2015-01-08 HISTORY — DX: Overactive bladder: N32.81

## 2015-01-08 HISTORY — DX: Unspecified osteoarthritis, unspecified site: M19.90

## 2015-01-08 LAB — CBC
HCT: 39.6 % (ref 36.0–46.0)
HCT: 39 % (ref 36.0–46.0)
HEMOGLOBIN: 12.2 g/dL (ref 12.0–15.0)
Hemoglobin: 12.4 g/dL (ref 12.0–15.0)
MCH: 26 pg (ref 26.0–34.0)
MCH: 25.8 pg — ABNORMAL LOW (ref 26.0–34.0)
MCHC: 31.3 g/dL (ref 30.0–36.0)
MCHC: 31.3 g/dL (ref 30.0–36.0)
MCV: 83 fL (ref 78.0–100.0)
MCV: 82.6 fL (ref 78.0–100.0)
Platelets: 330 10*3/uL (ref 150–400)
Platelets: 303 10*3/uL (ref 150–400)
RBC: 4.77 MIL/uL (ref 3.87–5.11)
RBC: 4.72 MIL/uL (ref 3.87–5.11)
RDW: 15.6 % — ABNORMAL HIGH (ref 11.5–15.5)
RDW: 15.6 % — ABNORMAL HIGH (ref 11.5–15.5)
WBC: 8.7 10*3/uL (ref 4.0–10.5)
WBC: 10 10*3/uL (ref 4.0–10.5)

## 2015-01-08 LAB — DIFFERENTIAL
BASOS PCT: 0 % (ref 0–1)
Basophils Absolute: 0 10*3/uL (ref 0.0–0.1)
Eosinophils Absolute: 0.2 10*3/uL (ref 0.0–0.7)
Eosinophils Relative: 2 % (ref 0–5)
Lymphocytes Relative: 21 % (ref 12–46)
Lymphs Abs: 1.8 10*3/uL (ref 0.7–4.0)
MONO ABS: 0.5 10*3/uL (ref 0.1–1.0)
MONOS PCT: 6 % (ref 3–12)
NEUTROS ABS: 6.2 10*3/uL (ref 1.7–7.7)
Neutrophils Relative %: 71 % (ref 43–77)

## 2015-01-08 LAB — I-STAT CHEM 8, ED
BUN: 16 mg/dL (ref 6–23)
Calcium, Ion: 1.08 mmol/L — ABNORMAL LOW (ref 1.13–1.30)
Chloride: 101 mmol/L (ref 96–112)
Creatinine, Ser: 1.1 mg/dL (ref 0.50–1.10)
Glucose, Bld: 101 mg/dL — ABNORMAL HIGH (ref 70–99)
HCT: 42 % (ref 36.0–46.0)
HEMOGLOBIN: 14.3 g/dL (ref 12.0–15.0)
Potassium: 3.3 mmol/L — ABNORMAL LOW (ref 3.5–5.1)
SODIUM: 142 mmol/L (ref 135–145)
TCO2: 24 mmol/L (ref 0–100)

## 2015-01-08 LAB — PROTIME-INR
INR: 1.01 (ref 0.00–1.49)
Prothrombin Time: 13.4 seconds (ref 11.6–15.2)

## 2015-01-08 LAB — URINALYSIS, ROUTINE W REFLEX MICROSCOPIC
Bilirubin Urine: NEGATIVE
Glucose, UA: NEGATIVE mg/dL
Hgb urine dipstick: NEGATIVE
Ketones, ur: NEGATIVE mg/dL
LEUKOCYTES UA: NEGATIVE
NITRITE: NEGATIVE
Protein, ur: NEGATIVE mg/dL
SPECIFIC GRAVITY, URINE: 1.008 (ref 1.005–1.030)
UROBILINOGEN UA: 0.2 mg/dL (ref 0.0–1.0)
pH: 6.5 (ref 5.0–8.0)

## 2015-01-08 LAB — BASIC METABOLIC PANEL
Anion gap: 12 (ref 5–15)
BUN: 13 mg/dL (ref 6–23)
CALCIUM: 9.2 mg/dL (ref 8.4–10.5)
CHLORIDE: 104 mmol/L (ref 96–112)
CO2: 27 mmol/L (ref 19–32)
Creatinine, Ser: 1.18 mg/dL — ABNORMAL HIGH (ref 0.50–1.10)
GFR calc Af Amer: 47 mL/min — ABNORMAL LOW (ref >90)
GFR, EST NON AFRICAN AMERICAN: 40 mL/min — AB (ref >90)
GLUCOSE: 100 mg/dL — AB (ref 70–99)
Potassium: 3.4 mmol/L — ABNORMAL LOW (ref 3.5–5.1)
SODIUM: 143 mmol/L (ref 135–145)

## 2015-01-08 LAB — BRAIN NATRIURETIC PEPTIDE: B NATRIURETIC PEPTIDE 5: 75.9 pg/mL (ref 0.0–100.0)

## 2015-01-08 LAB — I-STAT TROPONIN, ED: Troponin i, poc: 0.01 ng/mL (ref 0.00–0.08)

## 2015-01-08 LAB — ETHANOL: Alcohol, Ethyl (B): <5 mg/dL (ref 0–9)

## 2015-01-08 LAB — CREATININE, SERUM
Creatinine, Ser: 1.12 mg/dL — ABNORMAL HIGH (ref 0.50–1.10)
GFR calc non Af Amer: 43 mL/min — ABNORMAL LOW (ref >90)
GFR, EST AFRICAN AMERICAN: 50 mL/min — AB (ref >90)

## 2015-01-08 LAB — RAPID URINE DRUG SCREEN, HOSP PERFORMED
AMPHETAMINES: NOT DETECTED
Barbiturates: POSITIVE — AB
Benzodiazepines: NOT DETECTED
Cocaine: NOT DETECTED
OPIATES: NOT DETECTED
TETRAHYDROCANNABINOL: NOT DETECTED

## 2015-01-08 LAB — APTT: aPTT: 29 seconds (ref 24–37)

## 2015-01-08 LAB — MAGNESIUM: MAGNESIUM: 1.9 mg/dL (ref 1.5–2.5)

## 2015-01-08 MED ORDER — DARIFENACIN HYDROBROMIDE ER 15 MG PO TB24
15.0000 mg | ORAL_TABLET | Freq: Every day | ORAL | Status: DC
  Administered 2015-01-09 – 2015-01-10 (×2): 15 mg via ORAL
  Filled 2015-01-08 (×2): qty 1

## 2015-01-08 MED ORDER — ASPIRIN EC 81 MG PO TBEC
81.0000 mg | DELAYED_RELEASE_TABLET | Freq: Every day | ORAL | Status: DC
  Administered 2015-01-08 – 2015-01-10 (×3): 81 mg via ORAL
  Filled 2015-01-08 (×3): qty 1

## 2015-01-08 MED ORDER — AMLODIPINE BESYLATE 5 MG PO TABS
5.0000 mg | ORAL_TABLET | Freq: Every day | ORAL | Status: DC
  Administered 2015-01-09 – 2015-01-10 (×2): 5 mg via ORAL
  Filled 2015-01-08 (×3): qty 1

## 2015-01-08 MED ORDER — MOMETASONE FURO-FORMOTEROL FUM 100-5 MCG/ACT IN AERO
2.0000 | INHALATION_SPRAY | Freq: Two times a day (BID) | RESPIRATORY_TRACT | Status: DC
  Administered 2015-01-09 – 2015-01-10 (×2): 2 via RESPIRATORY_TRACT
  Filled 2015-01-08 (×2): qty 8.8

## 2015-01-08 MED ORDER — MIDODRINE HCL 5 MG PO TABS
5.0000 mg | ORAL_TABLET | Freq: Two times a day (BID) | ORAL | Status: DC
  Administered 2015-01-09: 5 mg via ORAL
  Filled 2015-01-08 (×5): qty 1

## 2015-01-08 MED ORDER — POTASSIUM CHLORIDE IN NACL 20-0.9 MEQ/L-% IV SOLN
INTRAVENOUS | Status: DC
  Administered 2015-01-09 – 2015-01-10 (×2): via INTRAVENOUS
  Filled 2015-01-08 (×5): qty 1000

## 2015-01-08 MED ORDER — SODIUM CHLORIDE 0.9 % IJ SOLN
3.0000 mL | Freq: Two times a day (BID) | INTRAMUSCULAR | Status: DC
  Administered 2015-01-08 – 2015-01-10 (×3): 3 mL via INTRAVENOUS

## 2015-01-08 MED ORDER — PANTOPRAZOLE SODIUM 40 MG PO TBEC
40.0000 mg | DELAYED_RELEASE_TABLET | Freq: Every day | ORAL | Status: DC
  Administered 2015-01-08 – 2015-01-10 (×3): 40 mg via ORAL
  Filled 2015-01-08 (×3): qty 1

## 2015-01-08 MED ORDER — HEPARIN SODIUM (PORCINE) 5000 UNIT/ML IJ SOLN
5000.0000 [IU] | Freq: Three times a day (TID) | INTRAMUSCULAR | Status: DC
  Administered 2015-01-08 – 2015-01-10 (×4): 5000 [IU] via SUBCUTANEOUS
  Filled 2015-01-08 (×9): qty 1

## 2015-01-08 MED ORDER — ACETAMINOPHEN 325 MG PO TABS
650.0000 mg | ORAL_TABLET | Freq: Four times a day (QID) | ORAL | Status: DC | PRN
  Administered 2015-01-08 – 2015-01-10 (×3): 650 mg via ORAL
  Filled 2015-01-08 (×2): qty 2

## 2015-01-08 NOTE — H&P (Addendum)
Hospitalist Admission History and Physical  Patient name: Kristie Morris Medical record number: [id number] Date of birth: [birth year] Age: 81 y.o. Gender: female  Primary Care Provider: Alonza Bogus, MD  Chief Complaint: recurrent falls, presyncope   History of Present Illness:This is a 81 y.o. year old female with significant past medical history of CVA w/ chronic mild L sided weakness, gait abnormality, diastolic dysfunction, CAD, COPD  presenting with recurrent falls, presyncope. Pt states that she initially fell down the steps about 6 weeks ago subsequent breaking her left foot. Patient states she's been having recurrent falls at home. Usually associated with going from sitting to standing or prolonged walking. Denies any true syncope but does have some mild weakness and dizziness associated with symptoms. Followed up with her PCP about issue. Patient was recommended to stand for 15 seconds prior to walking. Is noted to be on Midrin. Noted CVA last year with residual left-sided weakness. Patient is overall high functioning however does report she's had worsening left lower extremity weakness and heaviness over the past few months. No fevers or chills no nausea or vomiting. Positive intermittent head trauma associated with falls at home. Not on anticoagulation. Patient presented to the ER afebrile, heart rate in the 80s to 90s, respirations in the tens, blood pressure in the 100s 200s over 60s to 90s but mainly in the 110s. Satting 94% on room air. CBC and BMP within normal limits grossly apart from potassium of 3.3. Multiple imaging modalities including chest x-ray L-spine x-ray, CT of the head and C-spine negative for any acute abnormality though noted osteopenic and a gender changes on imaging.  Assessment and Plan: Kristie Morris is a 81 y.o. year old female presenting with recurrent falls, presyncope   Active Problems:   Recurrent falls   Pre-syncope   1- Recurrent  falls/presyncope -will need to rule neurocardiogenic sources of sxs -baseline hx/o CVA w/ residual L sided deficits w/ gait abnormality likely confounder -no true syncopal episodes reported  -? Orthostatic element-check -MRI, 2D ECHO  -cont midodrine -check orthostatic BPs -gently hydrate -PT/OT (pt requests HHRN vs. SNF placement) -tele bed -follow   2-Diastolic dysfunction -2D ECHO 6/20145 w/ EF 55-60% w/ grade 1 diastolic dysfunction -euvolemic clinically  -BNP, 2D ECHO to coorelate -gently hydrate in the interim  3-HTN -elevated on presentation though self resolved -cont low dose norvasc pending orthostatics  4-COPD -resp status stable  -no wheezing, increased WOB, hypoxia on exam -CXR WNL  -cont home regimen, folllow   FEN/GI: heart healthy diet  Prophylaxis: sub q eparin  Disposition: pending further evaluation  Code Status:Full Code    Patient Active Problem List   Diagnosis Date Noted  . Abnormality of gait 01/08/2015  . Recurrent falls 01/08/2015  . Left-sided weakness 04/02/2014  . Malignant hypertension 02/02/2014  . Diastolic dysfunction 99991111  . Asthma, chronic 02/02/2014  . COPD (chronic obstructive pulmonary disease) 02/02/2014  . Anemia 02/02/2014  . Hyperlipidemia 02/02/2014  . Dyspnea 01/31/2014   Past Medical History: Past Medical History  Diagnosis Date  . Hypertension   . Asthma   . Crohn disease   . Cancer     breast  . COPD (chronic obstructive pulmonary disease)   . Malignant hypertension 02/02/2014  . Diastolic dysfunction [id number]  . Anemia 02/02/2014    Anemia panel WNL  . Hyperlipidemia   . CAD (coronary artery disease) 2003    Cath with nonobstructive CAD  . Macular degeneration     Bilateral  .  Stroke   . Abnormality of gait 01/08/2015    Past Surgical History: Past Surgical History  Procedure Laterality Date  . Mastectomy    . Back surgery    . Knee surgery    . Abdominal hysterectomy    . Ovarian cyst removal     . Intraocular lens exchange      Social History: History   Social History  . Marital Status: Widowed    Spouse Name: N/A  . Number of Children: 2  . Years of Education: 16   Occupational History  . RETIRED   . ACCOUNTANT    Social History Main Topics  . Smoking status: Former Smoker -- 1.00 packs/day for 15 years    Types: Cigarettes  . Smokeless tobacco: Never Used     Comment: QUIT 1986  . Alcohol Use: No  . Drug Use: No  . Sexual Activity: Not Currently   Other Topics Concern  . None   Social History Narrative    Family History: Family History  Problem Relation Age of Onset  . Heart attack Mother   . Heart attack Sister   . Cancer - Ovarian Sister   . Cancer - Colon Father     Allergies: Allergies  Allergen Reactions  . Zithromax [Azithromycin] Diarrhea    Pt in hosp for 2 weeks   . Celebrex [Celecoxib] Hives  . Ciprofloxacin Hives  . Penicillins Hives  . Polymyxin B Hives  . Sulfa Antibiotics Hives  . Vancomycin Hives  . Vioxx [Rofecoxib] Hives  . Metronidazole     Current Facility-Administered Medications  Medication Dose Route Frequency Provider Last Rate Last Dose  . 0.9 % NaCl with KCl 20 mEq/ L  infusion   Intravenous Continuous Deneise Lever, MD      . amLODipine (NORVASC) tablet 5 mg  5 mg Oral Daily Deneise Lever, MD      . aspirin EC tablet 81 mg  81 mg Oral Daily Deneise Lever, MD      . darifenacin (ENABLEX) 24 hr tablet 15 mg  15 mg Oral Daily Deneise Lever, MD      . heparin injection 5,000 Units  5,000 Units Subcutaneous 3 times per day Deneise Lever, MD      . midodrine (PROAMATINE) tablet 5 mg  5 mg Oral BID Deneise Lever, MD      . mometasone-formoterol [facility]) 100-5 MCG/ACT inhaler 2 puff  2 puff Inhalation BID Deneise Lever, MD      . pantoprazole (PROTONIX) EC tablet 40 mg  40 mg Oral Daily Deneise Lever, MD      . sodium chloride 0.9 % injection 3 mL  3 mL Intravenous Q12H Deneise Lever, MD       Current  Outpatient Prescriptions  Medication Sig Dispense Refill  . amLODipine (NORVASC) 5 MG tablet Take 5 mg AM and 5 mg PM (Patient taking differently: Take 5 mg by mouth daily. Take 5 mg AM and 5 mg PM) 180 tablet 3  . aspirin 325 MG tablet Take 325 mg by mouth every 4 (four) hours as needed for mild pain. Pt takes in addition to 81mg  per day    . aspirin EC 81 MG tablet Take 81 mg by mouth daily.    Marland Kitchen dicyclomine (BENTYL) 20 MG tablet Take 20 mg by mouth 2 (two) times daily.     Marland Kitchen esomeprazole (NEXIUM) 20 MG capsule Take 20 mg by mouth daily at  12 noon.    . Fluticasone-Salmeterol (ADVAIR) 250-50 MCG/DOSE AEPB Inhale 1 puff into the lungs 2 (two) times daily.    . midodrine (PROAMATINE) 5 MG tablet Take 5 mg by mouth 2 (two) times daily.    . Multiple Vitamins-Minerals (PRESERVISION AREDS PO) Take 1 tablet by mouth daily.    . Probiotic Product (ALIGN PO) Take 1 capsule by mouth daily.    . VESICARE 10 MG tablet Take 10 mg by mouth daily.    . furosemide (LASIX) 20 MG tablet Take Lasix 20 mg daily Monday thru Friday with Potassium (Patient not taking: Reported on 01/08/2015) 30 tablet 3  . potassium chloride SA (KLOR-CON M20) 20 MEQ tablet Take with Lasix as needed (Patient not taking: Reported on 01/08/2015) 30 tablet 3  . traMADol (ULTRAM) 50 MG tablet Take 1 tablet (50 mg total) by mouth every 6 (six) hours as needed. (Patient not taking: Reported on 01/08/2015) 30 tablet 0   Review Of Systems: 12 point ROS negative except as noted above in HPI.  Physical Exam: Filed Vitals:   01/08/15 1851  BP: 116/95  Pulse: 96  Temp:   Resp: 18    General: alert and cooperative HEENT: PERRLA and extra ocular movement intact Heart: S1, S2 normal, no murmur, rub or gallop, regular rate and rhythm Lungs: clear to auscultation, no wheezes or rales and unlabored breathing Abdomen: abdomen is soft without significant tenderness, masses, organomegaly or guarding Extremities: extremities normal, atraumatic, no  cyanosis or edema Skin:no rashes, no ecchymoses Neurology: alert and oriented, minimal to mild L sided weakness  Labs and Imaging: Lab Results  Component Value Date/Time   NA 142 01/08/2015 03:25 PM   K 3.3* 01/08/2015 03:25 PM   CL 101 01/08/2015 03:25 PM   CO2 27 01/08/2015 03:12 PM   BUN 16 01/08/2015 03:25 PM   CREATININE 1.10 01/08/2015 03:25 PM   CREATININE 0.92 06/28/2014 03:52 PM   GLUCOSE 101* 01/08/2015 03:25 PM   Lab Results  Component Value Date   WBC 8.7 01/08/2015   HGB 14.3 01/08/2015   HCT 42.0 01/08/2015   MCV 83.0 01/08/2015   PLT 330 01/08/2015    Dg Chest 2 View  01/08/2015   CLINICAL DATA:  Cough, shortness of Breath  EXAM: CHEST  2 VIEW  COMPARISON:  03/17/2014  FINDINGS: Cardiomediastinal silhouette is stable. Bilateral axillary surgical clips are again noted. No acute infiltrate or pleural effusion. Atherosclerotic calcifications of thoracic aorta again noted. Osteopenia and mild degenerative changes thoracic spine.  IMPRESSION: No active cardiopulmonary disease. Atherosclerotic calcifications of thoracic aorta.   Electronically Signed   By: Lahoma Crocker M.D.   On: 01/08/2015 16:46   Dg Lumbar Spine Complete  01/08/2015   CLINICAL DATA:  Fall, back injury 8 days ago  EXAM: [place] 4+ VIEW  COMPARISON:  None.  FINDINGS: Five views of lumbar spine submitted. There is diffuse osteopenia. Disc space flattening with minimal anterior spurring at L3-L4 level. Disc space flattening at L4-L5 level. Minimal disc space flattening at L5-S1 level. Atherosclerotic calcifications of abdominal aorta.  IMPRESSION: Diffuse osteopenia. No acute fracture or subluxation. Degenerative changes as described above.   Electronically Signed   By: Lahoma Crocker M.D.   On: 01/08/2015 16:47   Ct Head Wo Contrast  01/08/2015   CLINICAL DATA:  Pain following falls.  Unsteady gait  EXAM: CT HEAD WITHOUT CONTRAST  CT CERVICAL SPINE WITHOUT CONTRAST  TECHNIQUE: Multidetector CT imaging  of the head  and cervical spine was performed following the standard protocol without intravenous contrast. Multiplanar CT image reconstructions of the cervical spine were also generated.  COMPARISON:  Brain MRI July 11, 2014  FINDINGS: CT HEAD FINDINGS  Moderate diffuse atrophy is stable. There is no intracranial mass, hemorrhage, extra-axial fluid collection, or midline shift. There is small vessel disease in the centra semiovale bilaterally. There is evidence of a prior small infarct in the inferior right centrum semiovale, stable. There is scattered of basal ganglia calcification. There is no new gray-white compartment lesion. No acute infarct apparent. Bony calvarium appears intact. The mastoid air cells are clear. There is frontal hyperostosis bilaterally.  CT CERVICAL SPINE FINDINGS  There is no fracture or spondylolisthesis. Prevertebral soft tissues and predental space regions are normal. There is ankylosis at C5-6. There is moderately severe disc space narrowing at C4-5 and C6-7. There is milder narrowing at C7-T1. There is multilevel facet hypertrophy. There is exit foraminal narrowing due to bony hypertrophy at C5-6 bilaterally. No disc extrusion or stenosis.  There are foci of carotid artery calcification bilaterally. There are several nodular opacities in the thyroid, largest measuring 1.2 x 1.1 cm in the left lobe.  IMPRESSION: CT head: Atrophy with periventricular small vessel disease. Prior infarct inferior right centrum semiovale. No intracranial mass, hemorrhage, or extra-axial fluid collection. No acute appearing infarct.  CT cervical spine: No fracture or spondylolisthesis. Multilevel arthropathy. Bones osteoporotic. There are foci of calcification in each carotid artery. Thyroid is inhomogeneous with nodular lesions within the thyroid. Recommend further evaluation with thyroid ultrasound. If patient is clinically hyperthyroid, consider nuclear medicine thyroid uptake and scan.   Electronically  Signed   By: Lowella Grip III M.D.   On: 01/08/2015 16:25   Ct Cervical Spine Wo Contrast  01/08/2015   CLINICAL DATA:  Pain following falls.  Unsteady gait  EXAM: CT HEAD WITHOUT CONTRAST  CT CERVICAL SPINE WITHOUT CONTRAST  TECHNIQUE: Multidetector CT imaging of the head and cervical spine was performed following the standard protocol without intravenous contrast. Multiplanar CT image reconstructions of the cervical spine were also generated.  COMPARISON:  Brain MRI July 11, 2014  FINDINGS: CT HEAD FINDINGS  Moderate diffuse atrophy is stable. There is no intracranial mass, hemorrhage, extra-axial fluid collection, or midline shift. There is small vessel disease in the centra semiovale bilaterally. There is evidence of a prior small infarct in the inferior right centrum semiovale, stable. There is scattered of basal ganglia calcification. There is no new gray-white compartment lesion. No acute infarct apparent. Bony calvarium appears intact. The mastoid air cells are clear. There is frontal hyperostosis bilaterally.  CT CERVICAL SPINE FINDINGS  There is no fracture or spondylolisthesis. Prevertebral soft tissues and predental space regions are normal. There is ankylosis at C5-6. There is moderately severe disc space narrowing at C4-5 and C6-7. There is milder narrowing at C7-T1. There is multilevel facet hypertrophy. There is exit foraminal narrowing due to bony hypertrophy at C5-6 bilaterally. No disc extrusion or stenosis.  There are foci of carotid artery calcification bilaterally. There are several nodular opacities in the thyroid, largest measuring 1.2 x 1.1 cm in the left lobe.  IMPRESSION: CT head: Atrophy with periventricular small vessel disease. Prior infarct inferior right centrum semiovale. No intracranial mass, hemorrhage, or extra-axial fluid collection. No acute appearing infarct.  CT cervical spine: No fracture or spondylolisthesis. Multilevel arthropathy. Bones osteoporotic. There are  foci of calcification in each carotid artery. Thyroid is inhomogeneous with nodular lesions within the  thyroid. Recommend further evaluation with thyroid ultrasound. If patient is clinically hyperthyroid, consider nuclear medicine thyroid uptake and scan.   Electronically Signed   By: Lowella Grip III M.D.   On: 01/08/2015 16:25           Shanda Howells MD  Pager: [phone]

## 2015-01-08 NOTE — ED Notes (Signed)
Pt unable to stand up for orthostatic vitals.

## 2015-01-08 NOTE — ED Notes (Signed)
Per [employer] , pt is from home. About 3 weeks ago, patient fell and hit head on cement. States she doesn't know why she fell. Since then, pt has been falling every day. When she stands up, she feels "like im in outer space". Per patients doctor stated she was positive orthostatic. 206/113 initial BP by EMS, now BP is 166. Pt also c/o headache. Pt is AAOX4, in NAD. Hx of HTN, hx of stroke. Partial paralyzed on left side from previous stroke.

## 2015-01-08 NOTE — ED Provider Notes (Signed)
CSN: [id number]     Arrival date & time 01/08/15  1435 History   First MD Initiated Contact with Patient 01/08/15 1456     Chief Complaint  Patient presents with  . Hypertension  . Headache     (Consider location/radiation/quality/duration/timing/severity/associated sxs/prior Treatment) HPI Comments: Patient presents from home with recurrent falls.  States she had a fall on March 10 episodes 6 concrete steps where she fractured her left foot. Since then she's been having falls daily. She reports hitting her head and losing consciousness at times. She doesn't have any preceding dizziness or lightheadedness. She says she feels woozy when she stands up. Denies any chest pain or shortness of breath. Denies any nausea or vomiting. Patient with left-sided weakness which is unchanged from previous stroke. She is not taking anticoagulants. She feels like she has been eating and drinking well. She lives alone. She complains of a headache now and some low back pain. Her last fall was last night.  Patient is a 81 y.o. female presenting with headaches. The history is provided by the patient and the EMS personnel.  Headache Associated symptoms: dizziness and weakness   Associated symptoms: no abdominal pain, no congestion, no cough, no fatigue, no fever, no myalgias, no nausea and no vomiting     Past Medical History  Diagnosis Date  . Hypertension   . Asthma   . Crohn disease   . COPD (chronic obstructive pulmonary disease)   . Malignant hypertension 02/02/2014  . Diastolic dysfunction [id number]  . Anemia 02/02/2014    Anemia panel WNL  . CAD (coronary artery disease) 2003    Cath with nonobstructive CAD  . Abnormality of gait 01/08/2015  . Heart murmur   . CHF (congestive heart failure)   . Pneumonia 2014; 2015  . History of blood transfusion     "I had 7; related to packing left inside me when I had a D&C; got blood poisoning"  . GERD (gastroesophageal reflux disease)   . Daily headache      "this last few months" (01/08/2015)  . Migraine     "when I was a young woman" (01/08/2015)  . Stroke 03/2014    "left leg weak; lost of strength in my left side; slight tremors; some speach problems since" (01/08/2015)  . Arthritis     "left knee" (01/08/2015)  . Fibromyalgia   . Chronic lower back pain   . Anxiety   . Breast cancer     "I've had it in both breasts"  . Macular hole, both eyes     "both wet and dry"   Past Surgical History  Procedure Laterality Date  . Mastectomy, partial Right 1999  . Back surgery    . Knee arthroscopy Right 2005  . Abdominal hysterectomy  1954  . Ovarian cyst removal  1946  . Cataract extraction w/ intraocular lens implant Right 2008  . Dilation and curettage of uterus  1953    S/P miscarriage  . Hemorrhoid surgery  1958  . Anterior cervical decomp/discectomy fusion  1971    "used bone off of my right hip"  . Abdominal adhesion surgery  1993    from colon  . Breast lumpectomy Left 2006  . Patella fracture surgery Left 2008  . Cataract extraction w/ intraocular lens implant Left 2015  . Foot fracture surgery Left 12/2014  . Fracture surgery    . Cardiac catheterization  11/2001   Family History  Problem Relation Age of Onset  . Heart  attack Mother   . Heart attack Sister   . Cancer - Ovarian Sister   . Cancer - Colon Father    History  Substance Use Topics  . Smoking status: Former Smoker -- 0.50 packs/day for 15 years    Types: Cigarettes  . Smokeless tobacco: Never Used     Comment: QUIT 1988  . Alcohol Use: No   OB History    No data available     Review of Systems  Constitutional: Negative for fever, activity change, appetite change and fatigue.  HENT: Negative for congestion and rhinorrhea.   Eyes: Negative for visual disturbance.  Respiratory: Negative for cough, chest tightness and shortness of breath.   Cardiovascular: Negative for chest pain.  Gastrointestinal: Negative for nausea, vomiting and abdominal pain.   Genitourinary: Negative for dysuria, vaginal bleeding and vaginal discharge.  Musculoskeletal: Negative for myalgias and arthralgias.  Skin: Negative for rash.  Neurological: Positive for dizziness, weakness and headaches.  A complete 10 system review of systems was obtained and all systems are negative except as noted in the HPI and PMH.      Allergies  Zithromax; Celebrex; Ciprofloxacin; Penicillins; Polymyxin b; Sulfa antibiotics; Vancomycin; Vioxx; and Metronidazole  Home Medications   Prior to Admission medications   Medication Sig Start Date End Date Taking? Authorizing Provider  amLODipine (NORVASC) 5 MG tablet Take 5 mg AM and 5 mg PM Patient taking differently: Take 5 mg by mouth daily. Take 5 mg AM and 5 mg PM 09/14/14  Yes Herminio Commons, MD  aspirin 325 MG tablet Take 325 mg by mouth every 4 (four) hours as needed for mild pain. Pt takes in addition to 81mg  per day   Yes Historical Provider, MD  aspirin EC 81 MG tablet Take 81 mg by mouth daily.   Yes Historical Provider, MD  dicyclomine (BENTYL) 20 MG tablet Take 20 mg by mouth 2 (two) times daily.    Yes Historical Provider, MD  esomeprazole (NEXIUM) 20 MG capsule Take 20 mg by mouth daily at 12 noon.   Yes Historical Provider, MD  Fluticasone-Salmeterol (ADVAIR) 250-50 MCG/DOSE AEPB Inhale 1 puff into the lungs 2 (two) times daily.   Yes Historical Provider, MD  midodrine (PROAMATINE) 5 MG tablet Take 5 mg by mouth 2 (two) times daily. 01/02/15  Yes Historical Provider, MD  Multiple Vitamins-Minerals (PRESERVISION AREDS PO) Take 1 tablet by mouth daily.   Yes Historical Provider, MD  Probiotic Product (ALIGN PO) Take 1 capsule by mouth daily.   Yes Historical Provider, MD  VESICARE 10 MG tablet Take 10 mg by mouth daily. 01/03/15  Yes Historical Provider, MD  furosemide (LASIX) 20 MG tablet Take Lasix 20 mg daily Monday thru Friday with Potassium Patient not taking: Reported on 01/08/2015 09/14/14   Herminio Commons,  MD  potassium chloride SA (KLOR-CON M20) 20 MEQ tablet Take with Lasix as needed Patient not taking: Reported on 01/08/2015 05/15/14   Herminio Commons, MD  traMADol (ULTRAM) 50 MG tablet Take 1 tablet (50 mg total) by mouth every 6 (six) hours as needed. Patient not taking: Reported on 01/08/2015 12/13/14   Daleen Bo, MD   BP 168/98 mmHg  Pulse 82  Temp(Src) 97.5 F (36.4 C) (Axillary)  Resp 16  Ht 5\' 3"  (1.6 m)  Wt 202 lb 4.8 oz (91.763 kg)  BMI 35.84 kg/m2  SpO2 96% Physical Exam  Constitutional: She is oriented to person, place, and time. She appears well-developed and well-nourished. No  distress.  HENT:  Head: Normocephalic and atraumatic.  Mouth/Throat: Oropharynx is clear and moist. No oropharyngeal exudate.  Eyes: Conjunctivae and EOM are normal. Pupils are equal, round, and reactive to light.  Neck: Normal range of motion. Neck supple.  No C spine tenderness  Cardiovascular: Normal rate, regular rhythm, normal heart sounds and intact distal pulses.   No murmur heard. Pulmonary/Chest: Effort normal and breath sounds normal. No respiratory distress. She exhibits no tenderness.  Abdominal: Soft. There is no tenderness. There is no rebound and no guarding.  Musculoskeletal: Normal range of motion. She exhibits no edema or tenderness.  Neurological: She is alert and oriented to person, place, and time. No cranial nerve deficit. She exhibits normal muscle tone. Coordination normal.  No ataxia on finger to nose bilaterally. No pronator drift. 4/5 strength LUE and LLE. CN 2-12 intact. Equal grip strength. Sensation intact.   Skin: Skin is warm.  Psychiatric: She has a normal mood and affect. Her behavior is normal.  Nursing note and vitals reviewed.   ED Course  Procedures (including critical care time) Labs Review Labs Reviewed  CBC - Abnormal; Notable for the following:    RDW 15.6 (*)    All other components within normal limits  BASIC METABOLIC PANEL - Abnormal;  Notable for the following:    Potassium 3.4 (*)    Glucose, Bld 100 (*)    Creatinine, Ser 1.18 (*)    GFR calc non Af Amer 40 (*)    GFR calc Af Amer 47 (*)    All other components within normal limits  URINE RAPID DRUG SCREEN (HOSP PERFORMED) - Abnormal; Notable for the following:    Barbiturates POSITIVE (*)    All other components within normal limits  CBC - Abnormal; Notable for the following:    MCH 25.8 (*)    RDW 15.6 (*)    All other components within normal limits  CREATININE, SERUM - Abnormal; Notable for the following:    Creatinine, Ser 1.12 (*)    GFR calc non Af Amer 43 (*)    GFR calc Af Amer 50 (*)    All other components within normal limits  I-STAT CHEM 8, ED - Abnormal; Notable for the following:    Potassium 3.3 (*)    Glucose, Bld 101 (*)    Calcium, Ion 1.08 (*)    All other components within normal limits  ETHANOL  PROTIME-INR  APTT  DIFFERENTIAL  URINALYSIS, ROUTINE W REFLEX MICROSCOPIC  MAGNESIUM  BRAIN NATRIURETIC PEPTIDE  CBC WITH DIFFERENTIAL/PLATELET  COMPREHENSIVE METABOLIC PANEL  CBG MONITORING, ED  I-STAT CHEM 8, ED  I-STAT TROPOININ, ED  CBG MONITORING, ED    Imaging Review Dg Chest 2 View  01/08/2015   CLINICAL DATA:  Cough, shortness of Breath  EXAM: CHEST  2 VIEW  COMPARISON:  03/17/2014  FINDINGS: Cardiomediastinal silhouette is stable. Bilateral axillary surgical clips are again noted. No acute infiltrate or pleural effusion. Atherosclerotic calcifications of thoracic aorta again noted. Osteopenia and mild degenerative changes thoracic spine.  IMPRESSION: No active cardiopulmonary disease. Atherosclerotic calcifications of thoracic aorta.   Electronically Signed   By: Lahoma Crocker M.D.   On: 01/08/2015 16:46   Dg Lumbar Spine Complete  01/08/2015   CLINICAL DATA:  Fall, back injury 8 days ago  EXAM: [place] 4+ VIEW  COMPARISON:  None.  FINDINGS: Five views of lumbar spine submitted. There is diffuse osteopenia. Disc space  flattening with minimal anterior spurring at  L3-L4 level. Disc space flattening at L4-L5 level. Minimal disc space flattening at L5-S1 level. Atherosclerotic calcifications of abdominal aorta.  IMPRESSION: Diffuse osteopenia. No acute fracture or subluxation. Degenerative changes as described above.   Electronically Signed   By: Lahoma Crocker M.D.   On: 01/08/2015 16:47   Ct Head Wo Contrast  01/08/2015   CLINICAL DATA:  Pain following falls.  Unsteady gait  EXAM: CT HEAD WITHOUT CONTRAST  CT CERVICAL SPINE WITHOUT CONTRAST  TECHNIQUE: Multidetector CT imaging of the head and cervical spine was performed following the standard protocol without intravenous contrast. Multiplanar CT image reconstructions of the cervical spine were also generated.  COMPARISON:  Brain MRI July 11, 2014  FINDINGS: CT HEAD FINDINGS  Moderate diffuse atrophy is stable. There is no intracranial mass, hemorrhage, extra-axial fluid collection, or midline shift. There is small vessel disease in the centra semiovale bilaterally. There is evidence of a prior small infarct in the inferior right centrum semiovale, stable. There is scattered of basal ganglia calcification. There is no new gray-white compartment lesion. No acute infarct apparent. Bony calvarium appears intact. The mastoid air cells are clear. There is frontal hyperostosis bilaterally.  CT CERVICAL SPINE FINDINGS  There is no fracture or spondylolisthesis. Prevertebral soft tissues and predental space regions are normal. There is ankylosis at C5-6. There is moderately severe disc space narrowing at C4-5 and C6-7. There is milder narrowing at C7-T1. There is multilevel facet hypertrophy. There is exit foraminal narrowing due to bony hypertrophy at C5-6 bilaterally. No disc extrusion or stenosis.  There are foci of carotid artery calcification bilaterally. There are several nodular opacities in the thyroid, largest measuring 1.2 x 1.1 cm in the left lobe.  IMPRESSION: CT head: Atrophy  with periventricular small vessel disease. Prior infarct inferior right centrum semiovale. No intracranial mass, hemorrhage, or extra-axial fluid collection. No acute appearing infarct.  CT cervical spine: No fracture or spondylolisthesis. Multilevel arthropathy. Bones osteoporotic. There are foci of calcification in each carotid artery. Thyroid is inhomogeneous with nodular lesions within the thyroid. Recommend further evaluation with thyroid ultrasound. If patient is clinically hyperthyroid, consider nuclear medicine thyroid uptake and scan.   Electronically Signed   By: Lowella Grip III M.D.   On: 01/08/2015 16:25   Ct Cervical Spine Wo Contrast  01/08/2015   CLINICAL DATA:  Pain following falls.  Unsteady gait  EXAM: CT HEAD WITHOUT CONTRAST  CT CERVICAL SPINE WITHOUT CONTRAST  TECHNIQUE: Multidetector CT imaging of the head and cervical spine was performed following the standard protocol without intravenous contrast. Multiplanar CT image reconstructions of the cervical spine were also generated.  COMPARISON:  Brain MRI July 11, 2014  FINDINGS: CT HEAD FINDINGS  Moderate diffuse atrophy is stable. There is no intracranial mass, hemorrhage, extra-axial fluid collection, or midline shift. There is small vessel disease in the centra semiovale bilaterally. There is evidence of a prior small infarct in the inferior right centrum semiovale, stable. There is scattered of basal ganglia calcification. There is no new gray-white compartment lesion. No acute infarct apparent. Bony calvarium appears intact. The mastoid air cells are clear. There is frontal hyperostosis bilaterally.  CT CERVICAL SPINE FINDINGS  There is no fracture or spondylolisthesis. Prevertebral soft tissues and predental space regions are normal. There is ankylosis at C5-6. There is moderately severe disc space narrowing at C4-5 and C6-7. There is milder narrowing at C7-T1. There is multilevel facet hypertrophy. There is exit foraminal  narrowing due to bony hypertrophy at C5-6 bilaterally.  No disc extrusion or stenosis.  There are foci of carotid artery calcification bilaterally. There are several nodular opacities in the thyroid, largest measuring 1.2 x 1.1 cm in the left lobe.  IMPRESSION: CT head: Atrophy with periventricular small vessel disease. Prior infarct inferior right centrum semiovale. No intracranial mass, hemorrhage, or extra-axial fluid collection. No acute appearing infarct.  CT cervical spine: No fracture or spondylolisthesis. Multilevel arthropathy. Bones osteoporotic. There are foci of calcification in each carotid artery. Thyroid is inhomogeneous with nodular lesions within the thyroid. Recommend further evaluation with thyroid ultrasound. If patient is clinically hyperthyroid, consider nuclear medicine thyroid uptake and scan.   Electronically Signed   By: Lowella Grip III M.D.   On: 01/08/2015 16:25     EKG Interpretation   Date/Time:  Tuesday January 08 2015 14:46:59 EDT Ventricular Rate:  94 PR Interval:  184 QRS Duration: 73 QT Interval:  368 QTC Calculation: 460 R Axis:   -55 Text Interpretation:  Sinus rhythm Multiple ventricular premature  complexes Inferior infarct, old Baseline wander in lead(s) V2 Confirmed by  Rancho Cordova [phone]) on 01/08/2015 2:52:02 PM      MDM   Final diagnoses:  Recurrent falls   Patient reports recurrent falls since March 10 when she fell and fractured her foot. Since then she's been falling every day and feeling lightheaded and dizzy. No true syncope. No chest pain or shortness of breath.  EKG shows multiple PVCs. Head and C-spine CT are negative for acute pathology.  Patient is unable to ambulate without assistance. She cannot stand with orthostatic vitals. She feels lightheaded upon standing.  Patient lives alone and is clearly unsafe to do so. She cannot ambulate and will be admitted to the hospital for further evaluation of her frequent  falls     Ezequiel Essex, MD 01/09/15 0009

## 2015-01-08 NOTE — ED Notes (Signed)
Patient has no visible injury to head or extremities.

## 2015-01-08 NOTE — Telephone Encounter (Signed)
I called the family, left a message. The patient apparently has had multiple falls over the last several weeks. The patient has gone to the hospital today. I have not seen her in 7 or 8 months. I may need to see her back in office in the gait disorder is significant. The patient apparently has some lower extremity weakness.   CT head, cervical spine 01/08/2015:  IMPRESSION: CT head: Atrophy with periventricular small vessel disease. Prior infarct inferior right centrum semiovale. No intracranial mass, hemorrhage, or extra-axial fluid collection. No acute appearing infarct.  CT cervical spine: No fracture or spondylolisthesis. Multilevel arthropathy. Bones osteoporotic. There are foci of calcification in each carotid artery. Thyroid is inhomogeneous with nodular lesions within the thyroid. Recommend further evaluation with thyroid ultrasound. If patient is clinically hyperthyroid, consider nuclear medicine thyroid uptake and scan.

## 2015-01-08 NOTE — Telephone Encounter (Signed)
Pt's grandson is calling to inform Dr. Jannifer Franklin that the pt is on the way to [facility], she fell las tnight.  She did not injury herself but she is having a hard time walking and has weakness in her lower extremities.  She seems to be confused. Just calling to make you aware.

## 2015-01-08 NOTE — ED Notes (Signed)
Ambulated patient with max assist, patient has trouble getting out of bed and onto feet. However when patient started to walk she felt faint and had to sit right back down in bed. Physician notified

## 2015-01-09 ENCOUNTER — Observation Stay (HOSPITAL_COMMUNITY): Payer: Commercial Managed Care - HMO

## 2015-01-09 DIAGNOSIS — D649 Anemia, unspecified: Secondary | ICD-10-CM | POA: Diagnosis not present

## 2015-01-09 DIAGNOSIS — J449 Chronic obstructive pulmonary disease, unspecified: Secondary | ICD-10-CM | POA: Diagnosis not present

## 2015-01-09 DIAGNOSIS — R42 Dizziness and giddiness: Secondary | ICD-10-CM | POA: Diagnosis not present

## 2015-01-09 DIAGNOSIS — K5090 Crohn's disease, unspecified, without complications: Secondary | ICD-10-CM | POA: Diagnosis not present

## 2015-01-09 DIAGNOSIS — I10 Essential (primary) hypertension: Secondary | ICD-10-CM | POA: Diagnosis not present

## 2015-01-09 DIAGNOSIS — I69998 Other sequelae following unspecified cerebrovascular disease: Secondary | ICD-10-CM | POA: Diagnosis not present

## 2015-01-09 DIAGNOSIS — I2510 Atherosclerotic heart disease of native coronary artery without angina pectoris: Secondary | ICD-10-CM | POA: Diagnosis not present

## 2015-01-09 DIAGNOSIS — R296 Repeated falls: Secondary | ICD-10-CM | POA: Diagnosis not present

## 2015-01-09 DIAGNOSIS — I340 Nonrheumatic mitral (valve) insufficiency: Secondary | ICD-10-CM | POA: Diagnosis not present

## 2015-01-09 DIAGNOSIS — M6281 Muscle weakness (generalized): Secondary | ICD-10-CM | POA: Diagnosis not present

## 2015-01-09 DIAGNOSIS — R55 Syncope and collapse: Secondary | ICD-10-CM | POA: Diagnosis not present

## 2015-01-09 DIAGNOSIS — R011 Cardiac murmur, unspecified: Secondary | ICD-10-CM | POA: Diagnosis not present

## 2015-01-09 HISTORY — PX: TRANSTHORACIC ECHOCARDIOGRAM: SHX275

## 2015-01-09 LAB — COMPREHENSIVE METABOLIC PANEL
ALBUMIN: 3.4 g/dL — AB (ref 3.5–5.2)
ALT: 21 U/L (ref 0–35)
ALT: 25 U/L (ref 0–35)
AST: 40 U/L — AB (ref 0–37)
AST: 29 U/L (ref 0–37)
Albumin: 3.1 g/dL — ABNORMAL LOW (ref 3.5–5.2)
Alkaline Phosphatase: 122 U/L — ABNORMAL HIGH (ref 39–117)
Alkaline Phosphatase: 139 U/L — ABNORMAL HIGH (ref 39–117)
Anion gap: 13 (ref 5–15)
Anion gap: 8 (ref 5–15)
BILIRUBIN TOTAL: 0.5 mg/dL (ref 0.3–1.2)
BUN: 15 mg/dL (ref 6–23)
BUN: 12 mg/dL (ref 6–23)
CALCIUM: 8.7 mg/dL (ref 8.4–10.5)
CO2: 23 mmol/L (ref 19–32)
CO2: 30 mmol/L (ref 19–32)
CREATININE: 1.16 mg/dL — AB (ref 0.50–1.10)
CREATININE: 1.2 mg/dL — AB (ref 0.50–1.10)
Calcium: 8.9 mg/dL (ref 8.4–10.5)
Chloride: 103 mmol/L (ref 96–112)
Chloride: 104 mmol/L (ref 96–112)
GFR calc Af Amer: 46 mL/min — ABNORMAL LOW (ref >90)
GFR calc non Af Amer: 41 mL/min — ABNORMAL LOW (ref >90)
GFR calc non Af Amer: 39 mL/min — ABNORMAL LOW (ref >90)
GFR, EST AFRICAN AMERICAN: 48 mL/min — AB (ref >90)
Glucose, Bld: 81 mg/dL (ref 70–99)
Glucose, Bld: 108 mg/dL — ABNORMAL HIGH (ref 70–99)
POTASSIUM: 3.9 mmol/L (ref 3.5–5.1)
Potassium: 4.8 mmol/L (ref 3.5–5.1)
SODIUM: 139 mmol/L (ref 135–145)
Sodium: 142 mmol/L (ref 135–145)
Total Bilirubin: 1.5 mg/dL — ABNORMAL HIGH (ref 0.3–1.2)
Total Protein: 4.9 g/dL — ABNORMAL LOW (ref 6.0–8.3)
Total Protein: 6.2 g/dL (ref 6.0–8.3)

## 2015-01-09 LAB — CBC WITH DIFFERENTIAL/PLATELET
BASOS PCT: 0 % (ref 0–1)
Basophils Absolute: 0 10*3/uL (ref 0.0–0.1)
Eosinophils Absolute: 0.2 10*3/uL (ref 0.0–0.7)
Eosinophils Relative: 3 % (ref 0–5)
HCT: 39.3 % (ref 36.0–46.0)
Hemoglobin: 12.2 g/dL (ref 12.0–15.0)
LYMPHS PCT: 19 % (ref 12–46)
Lymphs Abs: 1.4 10*3/uL (ref 0.7–4.0)
MCH: 26.3 pg (ref 26.0–34.0)
MCHC: 31 g/dL (ref 30.0–36.0)
MCV: 84.7 fL (ref 78.0–100.0)
MONOS PCT: 8 % (ref 3–12)
Monocytes Absolute: 0.6 10*3/uL (ref 0.1–1.0)
NEUTROS ABS: 5.2 10*3/uL (ref 1.7–7.7)
NEUTROS PCT: 70 % (ref 43–77)
Platelets: 307 10*3/uL (ref 150–400)
RBC: 4.64 MIL/uL (ref 3.87–5.11)
RDW: 16 % — ABNORMAL HIGH (ref 11.5–15.5)
WBC: 7.4 10*3/uL (ref 4.0–10.5)

## 2015-01-09 MED ORDER — DIAZEPAM 5 MG PO TABS
5.0000 mg | ORAL_TABLET | Freq: Once | ORAL | Status: AC
  Administered 2015-01-09: 5 mg via ORAL
  Filled 2015-01-09: qty 1

## 2015-01-09 MED ORDER — TRAMADOL HCL 50 MG PO TABS
50.0000 mg | ORAL_TABLET | Freq: Once | ORAL | Status: AC
  Administered 2015-01-09: 50 mg via ORAL
  Filled 2015-01-09: qty 1

## 2015-01-09 MED ORDER — ENSURE ENLIVE PO LIQD
237.0000 mL | Freq: Two times a day (BID) | ORAL | Status: DC
  Administered 2015-01-09 – 2015-01-10 (×2): 237 mL via ORAL

## 2015-01-09 NOTE — Progress Notes (Signed)
Patient ID: Mearl Latin, female   DOB: 1924-11-20, 81 y.o.   MRN: [id number]  [facility] PROGRESS NOTE  ARRYANA CAYE [id number] DOB: 1925-02-11 DOA: 01/08/2015 PCP: Alonza Bogus, MD   Brief narrative:    81 y.o. Female with known history of CVA and residual left sided weakness and unsteady gait, chronic diastolic CHF, CAD, COPD, presented to [facility] ED with main concerns of recurrent episodes of falls near syncopal event that occurred one day prior to this admission. Pt explains that she also fell about 6 weeks ago and broke her left foot, since that time, her balance has been off. She has also noted that falls typically occur with changing positions from sitting to standing. Pt reports being independent at baseline, lives alone at home and is able to carry out ADL's with no assistance.   In ED, pt noted to be hemodynamically stable, VSS, oxygen saturation 94% on RA. CXR, XRAY of the L-spine, CT of the head and C-spine negative for any acute abnormality though noted osteopenic changes. [facility] asked to admit for further evaluation.   Assessment/Plan:    Recurrent falls/near syncopal events  - based on vital signs, pt with significant orthostatic changes - MRI brain and 2 D ECHO pending and will follow up on results  - PT evaluation done and pt determined she needs supervision and rehab - will see if pt qualifies for SNF  - continue to monitor on telemetry   Acuter renal failure - continue gentle hydration  - BMP in AM  Diastolic dysfunction, chronic  - 2D ECHO 6/20145 w/ EF 55-60% w/ grade 1 diastolic dysfunction - euvolemic clinically  - follow up on 2 D ECHO   HTN - elevated on presentation though self resolved - cont low dose norvasc  COPD - resp status stable  - no wheezing, increased WOB - CXR WNL  - cont home regimen, folllow   Morbid obesity - pt meets criteria with BMI 35. 84 with underlying HTN, COPD, CVA  DVT prophylaxis - Heparin SQ  Code Status: Full.   Family Communication:  plan of care discussed with the patient Disposition Plan: Will see with SW if pt qualifies for SNF, not ready for d/c and MRI and 2 D ECHO still pending   IV access:  Peripheral IV  Procedures and diagnostic studies:    Dg Chest 2 View  01/08/2015   No active cardiopulmonary disease. Atherosclerotic calcifications of thoracic aorta.     Dg Lumbar Spine Complete  01/08/2015  Diffuse osteopenia. No acute fracture or subluxation. Degenerative changes  Ct Cervical Spine Wo Contrast  01/08/2015  Atrophy with periventricular small vessel disease. Prior infarct inferior right centrum semiovale. No intracranial mass, hemorrhage, or extra-axial fluid collection. No acute appearing infarct.  CT cervical spine: No fracture or spondylolisthesis. Multilevel arthropathy. Bones osteoporotic. There are foci of calcification in each carotid artery. Thyroid is inhomogeneous with nodular lesions within the thyroid.   Dg Foot Complete Left  12/13/2014  Oblique mild displaced fracture of fifth metatarsal. No radiopaque foreign body.     Medical Consultants:  None  Other Consultants:  None  IAnti-Infectives:   None   Faye Ramsay, MD  [facility] Pager [phone]  If 7PM-7AM, please contact night-coverage [email or web address] Password [email or web address] 01/09/2015, 1:52 PM     HPI/Subjective: No events overnight.   Objective: Filed Vitals:   01/09/15 0933 01/09/15 0940 01/09/15 0953 01/09/15 0958  BP: 142/90 173/95 143/130 148/71  Pulse: 83 88 102 79  Temp:  TempSrc:      Resp:      Height:      Weight:      SpO2:        Intake/Output Summary (Last 24 hours) at 01/09/15 1352 Last data filed at 01/09/15 1100  Gross per 24 hour  Intake    220 ml  Output    400 ml  Net   -180 ml    Exam:   General:  Pt is alert, follows commands appropriately, not in acute distress  Cardiovascular: Regular rate and rhythm,  no rubs, no gallops  Respiratory: Clear to auscultation bilaterally, no  wheezing, no crackles, no rhonchi  Abdomen: Soft, non tender, non distended, bowel sounds present, no guarding  Extremities: pulses DP and PT palpable bilaterally  Neuro: Grossly nonfocal  Data Reviewed: Basic Metabolic Panel:  Recent Labs Lab 01/08/15 1512 01/08/15 1525 01/08/15 2135 01/09/15 0502 01/09/15 1150  NA 143 142  --  139 142  K 3.4* 3.3*  --  4.8 3.9  CL 104 101  --  103 104  CO2 27  --   --  23 30  GLUCOSE 100* 101*  --  81 108*  BUN 13 16  --  15 12  CREATININE 1.18* 1.10 1.12* 1.16* 1.20*  CALCIUM 9.2  --   --  8.7 8.9  MG  --   --  1.9  --   --    Liver Function Tests:  Recent Labs Lab 01/09/15 0502 01/09/15 1150  AST 40* 29  ALT 21 25  ALKPHOS 122* 139*  BILITOT 1.5* 0.5  PROT 4.9* 6.2  ALBUMIN 3.1* 3.4*   CBC:  Recent Labs Lab 01/08/15 1512 01/08/15 1525 01/08/15 2135 01/09/15 0747  WBC 8.7  --  10.0 7.4  NEUTROABS 6.2  --   --  5.2  HGB 12.4 14.3 12.2 12.2  HCT 39.6 42.0 39.0 39.3  MCV 83.0  --  82.6 84.7  PLT 330  --  303 307     Scheduled Meds: . amLODipine  5 mg Oral Daily  . aspirin EC  81 mg Oral Daily  . darifenacin  15 mg Oral Daily  . feeding supplement (ENSURE ENLIVE)  237 mL Oral BID BM  . heparin  5,000 Units Subcutaneous 3 times per day  . midodrine  5 mg Oral BID  . mometasone-formoterol  2 puff Inhalation BID  . pantoprazole  40 mg Oral Daily  . sodium chloride  3 mL Intravenous Q12H   Continuous Infusions: . 0.9 % NaCl with KCl 20 mEq / L 65 mL/hr at 01/09/15 0029

## 2015-01-09 NOTE — Progress Notes (Signed)
INITIAL NUTRITION ASSESSMENT  Pt meets criteria for SEVERE MALNUTRITION in the context of chronic illness as evidenced by a 9.4% weight loss in 4 months and energy intake </= 75% for >/= 1 month.  DOCUMENTATION CODES Per approved criteria  -Severe malnutrition in the context of chronic illness -Obesity Unspecified   INTERVENTION: Continue Ensure Enlive po BID, each supplement provides 350 kcal and 20 grams of protein.  Encourage adequate PO intake.   NUTRITION DIAGNOSIS: Inadequate oral intake related to decreased appetite as evidenced by 9.4% weight loss in 4 months.   Goal: Pt to meet >/= 90% of their estimated nutrition needs   Monitor:  PO intake, weight trends, labs, I/O's  Reason for Assessment: Kristie Morris  81 y.o. female  Admitting Dx: <principal problem not specified>  ASSESSMENT: Pt with significant past medical history of CVA w/ chronic mild L sided weakness, gait abnormality, diastolic dysfunction, CAD, COPD presenting with recurrent falls, presyncope.  Pt reports her appetite is fair currently. Pt does report appetite decreasing over the past 4 months she has been noticing she has been eating less at meals (~50% completion). Pt reports she has been eating a cup of yogurt and coffee for breakfast and then a ham sandwich for late lunch, no dinner. Pt reports she did not eat breakfast this AM due to extreme fatigue. Pt reports weight loss. Per Epic weight records, pt with a 9.4% weight loss in 4 months. Pt currently has Ensure ordered. Will continue with current orders. Pt was encouraged to eat her food at meals.  Pt with no observed significant fat or muscle mass loss.  Labs: Low GFR. High creatinine and alkaline phosphatase.  Height: Ht Readings from Last 1 Encounters:  01/08/15 5\' 3"  (1.6 m)    Weight: Wt Readings from Last 1 Encounters:  01/08/15 202 lb 4.8 oz (91.763 kg)    Ideal Body Weight: 115 lbs  % Ideal Body Weight: 176%  Wt Readings from Last 10  Encounters:  01/08/15 202 lb 4.8 oz (91.763 kg)  12/13/14 212 lb (96.163 kg)  09/14/14 223 lb (101.152 kg)  06/28/14 222 lb (100.699 kg)  06/27/14 222 lb (100.699 kg)  05/15/14 221 lb (100.245 kg)  04/02/14 221 lb (100.245 kg)  03/19/14 221 lb 12.8 oz (100.608 kg)  02/14/14 209 lb (94.802 kg)  02/02/14 213 lb 4.8 oz (96.752 kg)    Usual Body Weight: 220 lbs  % Usual Body Weight: 92%  BMI:  Body mass index is 35.84 kg/(m^2). Class II obesity  Estimated Nutritional Needs: Kcal: 1800-2000 Protein: 90-110 grams Fluid: 1.8 - 2 L/day  Skin: Intact  Diet Order: Diet Heart Room service appropriate?: Yes; Fluid consistency:: Thin  EDUCATION NEEDS: -No education needs identified at this time   Intake/Output Summary (Last 24 hours) at 01/09/15 1015 Last data filed at 01/09/15 0200  Gross per 24 hour  Intake    220 ml  Output      0 ml  Net    220 ml    Last BM: 4/4  Labs:   Recent Labs Lab 01/08/15 1512 01/08/15 1525 01/08/15 2135 01/09/15 0502  NA 143 142  --  139  K 3.4* 3.3*  --  4.8  CL 104 101  --  103  CO2 27  --   --  23  BUN 13 16  --  15  CREATININE 1.18* 1.10 1.12* 1.16*  CALCIUM 9.2  --   --  8.7  MG  --   --  1.9  --   GLUCOSE 100* 101*  --  81    CBG (last 3)  No results for input(s): GLUCAP in the last 72 hours.  Scheduled Meds: . amLODipine  5 mg Oral Daily  . aspirin EC  81 mg Oral Daily  . darifenacin  15 mg Oral Daily  . diazepam  5 mg Oral Once  . feeding supplement (ENSURE ENLIVE)  237 mL Oral BID BM  . heparin  5,000 Units Subcutaneous 3 times per day  . midodrine  5 mg Oral BID  . mometasone-formoterol  2 puff Inhalation BID  . pantoprazole  40 mg Oral Daily  . sodium chloride  3 mL Intravenous Q12H    Continuous Infusions: . 0.9 % NaCl with KCl 20 mEq / L 65 mL/hr at 01/09/15 [id number]    Past Medical History  Diagnosis Date  . Hypertension   . Asthma   . Crohn disease   . COPD (chronic obstructive pulmonary disease)   .  Malignant hypertension 02/02/2014  . Diastolic dysfunction [id number]  . Anemia 02/02/2014    Anemia panel WNL  . CAD (coronary artery disease) 2003    Cath with nonobstructive CAD  . Abnormality of gait 01/08/2015  . Heart murmur   . CHF (congestive heart failure)   . Pneumonia 2014; 2015  . History of blood transfusion     "I had 7; related to packing left inside me when I had a D&C; got blood poisoning"  . GERD (gastroesophageal reflux disease)   . Daily headache     "this last few months" (01/08/2015)  . Migraine     "when I was a young woman" (01/08/2015)  . Stroke 03/2014    "left leg weak; lost of strength in my left side; slight tremors; some speach problems since" (01/08/2015)  . Arthritis     "left knee" (01/08/2015)  . Fibromyalgia   . Chronic lower back pain   . Anxiety   . Breast cancer     "I've had it in both breasts"  . Macular hole, both eyes     "both wet and dry"  . OAB (overactive bladder)     Past Surgical History  Procedure Laterality Date  . Mastectomy, partial Right 1999  . Back surgery    . Knee arthroscopy Right 2005  . Abdominal hysterectomy  1954  . Ovarian cyst removal  1946  . Cataract extraction w/ intraocular lens implant Right 2008  . Dilation and curettage of uterus  1953    S/P miscarriage  . Hemorrhoid surgery  1958  . Anterior cervical decomp/discectomy fusion  1971    "used bone off of my right hip"  . Abdominal adhesion surgery  1993    from colon  . Breast lumpectomy Left 2006  . Patella fracture surgery Left 2008  . Cataract extraction w/ intraocular lens implant Left 2015  . Foot fracture surgery Left 12/2014  . Fracture surgery    . Cardiac catheterization  11/2001    Kallie Locks, MS, RD, LDN Pager # [phone] After hours/ weekend pager # [phone]

## 2015-01-09 NOTE — Evaluation (Signed)
Physical Therapy Evaluation Patient Details Name: Kristie Morris MRN: [id number] DOB: 1925/03/01 Today's Date: 01/09/2015   History of Present Illness  History of Present Illness:This is a 81 y.o. year old female with significant past medical history of CVA w/ chronic mild L sided weakness, gait abnormality, diastolic dysfunction, CAD, COPD presenting with recurrent falls, presyncope. Pt states that she initially fell down the steps about 6 weeks ago subsequent breaking her left foot. Patient states she's been having recurrent falls at home. Usually associated with going from sitting to standing or prolonged walking.  Clinical Impression   Pt admitted with above diagnosis. Pt currently with functional limitations due to the deficits listed below (see PT Problem List).  Pt will benefit from skilled PT to increase their independence and safety with mobility to allow discharge to the venue listed below.    Pt's descriptions of dizziness symptoms ("seasick") and her tendency to close her eyes with transition movements does point to a vestibular component to her falls; I was unable to appreciate any nystagmus, abnormal eye movements on today's exam -- will request a vestibular specialist to see her next session; She describes a previous visit with what sounds like a vestibular specialist ("they moved my head here, there, and here, and then I was cured").     Follow Up Recommendations Other (comment);Supervision/Assistance - 24 hour (Pt lives alone and is currently at a high fall risk and has significant gait deviations -- I favor a post-acute rehab setting, however, noted pt is Observation status, and unless she qualifies for Inpt status it is unlikely that SNF for rehab would be paid for; Is there an option for her to stay with family?)    Equipment Recommendations  Rolling walker with 5" wheels;3in1 (PT)    Recommendations for Other Services Other (comment) (Will request Vestibular Evaluation)      Precautions / Restrictions Precautions Precautions: Fall Precaution Comments: prior CVA with L sided weakness; hx of recurrent falls; vertigo? Required Braces or Orthoses: Other Brace/Splint Other Brace/Splint: boot on Left foot Restrictions Weight Bearing Restrictions: No      Mobility  Bed Mobility Overal bed mobility: Needs Assistance Bed Mobility: Supine to Sit     Supine to sit: Min assist     General bed mobility comments: Cues for technique; min assist for steadying; Pt reports feeling "seasick" upon initial sit  Transfers Overall transfer level: Needs assistance Equipment used: Rolling walker (2 wheeled) Transfers: Sit to/from Stand Sit to Stand: Mod assist         General transfer comment: light mod assist to power up; unable to stand without assist; closes her eyes upon initial stand, and informs [place] that she has to close her eyes and wait before she goes anywhere  Ambulation/Gait Ambulation/Gait assistance: Min assist;Mod assist Ambulation Distance (Feet): 14 Feet Assistive device: Rolling walker (2 wheeled) Gait Pattern/deviations: Shuffle;Decreased step length - right;Decreased step length - left;Decreased stride length;Trunk flexed Gait velocity: slow   General Gait Details: Near constant cues to keep RW close and stand upright; Noted decreased weight shift into single limb stance R or L leading to very short, shuffling steps; Step length imporved with manual facilitation of weight shift into single limb stance (R and L)  Stairs            Wheelchair Mobility    Modified Rankin (Stroke Patients Only)       Balance Overall balance assessment: History of Falls;Needs assistance  Standing balance-Leahy Scale: Poor Standing balance comment: Heavy reliance on UE suppot                             Pertinent Vitals/Pain Pain Assessment: No/denies pain    Home Living Family/patient expects to be discharged to:: Private  residence Living Arrangements: Alone Available Help at Discharge: Family;Available PRN/intermittently Type of Home: House (Townhouse) Home Access: Ramped entrance     Home Layout: One level Home Equipment: Cane - single point;Grab bars - toilet;Grab bars - tub/shower;Shower seat      Prior Function Level of Independence: Independent         Comments: Reports independent, no device until several weeks ago when falls started; within the past few weeks she has had about 4 falls per day, most falls not to floor, but to chair/couch     Hand Dominance        Extremity/Trunk Assessment   Upper Extremity Assessment: Defer to OT evaluation           Lower Extremity Assessment: Generalized weakness         Communication   Communication: No difficulties  Cognition Arousal/Alertness: Awake/alert Behavior During Therapy: WFL for tasks assessed/performed Overall Cognitive Status: Within Functional Limits for tasks assessed                      General Comments General comments (skin integrity, edema, etc.):   01/09/15 0933 01/09/15 0940 01/09/15 0953  Vital Signs  Pulse Rate 83 88 (!) 102  BP (!) 142/90 mmHg (!) 173/95 mmHg (!) 143/130 mmHg  Patient Position (if appropriate) Lying Sitting Standing (post amb around bed)     01/09/15 0958  Vital Signs  Pulse Rate 79  BP (!) 148/71 mmHg  Patient Position (if appropriate) Lying       Exercises        Assessment/Plan    PT Assessment Patient needs continued PT services  PT Diagnosis Difficulty walking;Generalized weakness;Abnormality of gait   PT Problem List Decreased strength;Decreased activity tolerance;Decreased balance;Decreased mobility;Decreased coordination;Decreased knowledge of use of DME;Decreased safety awareness;Decreased knowledge of precautions  PT Treatment Interventions DME instruction;Gait training;Functional mobility training;Therapeutic activities;Therapeutic exercise;Balance  training;Neuromuscular re-education;Patient/family education   PT Goals (Current goals can be found in the Care Plan section) Acute Rehab PT Goals Patient Stated Goal: Wants to be able to get up and around PT Goal Formulation: With patient Time For Goal Achievement: 01/16/15 Potential to Achieve Goals: Good    Frequency Min 4X/week   Barriers to discharge Decreased caregiver support Lives alone; supportive family, but no 24 hour assist available    Co-evaluation PT/OT/SLP Co-Evaluation/Treatment: Yes Reason for Co-Treatment: For patient/therapist safety PT goals addressed during session: Mobility/safety with mobility;Balance;Proper use of DME         End of Session Equipment Utilized During Treatment: Gait belt Activity Tolerance: Patient tolerated treatment well Patient left: in bed;with call bell/phone within reach (Vascular ultrasound tech in) Nurse Communication: Mobility status    Functional Assessment Tool Used: Clinical Judgement Functional Limitation: Mobility: Walking and moving around Mobility: Walking and Moving Around Current Status [id number]): At least 20 percent but less than 40 percent impaired, limited or restricted Mobility: Walking and Moving Around Goal Status [phone]): 0 percent impaired, limited or restricted    Time: 0923-1005 PT Time Calculation (min) (ACUTE ONLY): 42 min   Charges:   PT Evaluation $Initial PT Evaluation Tier I: 1 Procedure PT Treatments $  Gait Training: 8-22 mins   PT G Codes:   PT G-Codes **NOT FOR INPATIENT CLASS** Functional Assessment Tool Used: Clinical Judgement Functional Limitation: Mobility: Walking and moving around Mobility: Walking and Moving Around Current Status [id number]): At least 20 percent but less than 40 percent impaired, limited or restricted Mobility: Walking and Moving Around Goal Status [phone]): 0 percent impaired, limited or restricted    Roney Marion Hamff 01/09/2015, 11:01 AM  Roney Marion, [place] Pager [phone] Office [phone]

## 2015-01-09 NOTE — Progress Notes (Signed)
*  PRELIMINARY RESULTS* Echocardiogram 2D Echocardiogram has been performed.  Leavy Cella 01/09/2015, 11:22 AM

## 2015-01-09 NOTE — Progress Notes (Signed)
UR completed 

## 2015-01-09 NOTE — Evaluation (Signed)
Occupational Therapy Evaluation Patient Details Name: Kristie Morris MRN: [id number] DOB: 1925-04-19 Today's Date: 01/09/2015    History of Present Illness Pt is an 82 y.o. Female with PMH of CVA with Left sided weakness, diastolic dysfunction, CAD, COPD, gait instability, and recent L metatarsal fx after a fall, admitted 01/08/15 with recurrent falls and presyncope.    Clinical Impression   PTA pt lived at home and was independent with ADLs, however reports frequently recurring falls at home. Pt is a high fall risk due to generalized weakness and would benefit from subacute rehab (CIR Vs. SNF) however pt is observation status and may not qualify. If so, pt will need 24/7 Supervision at home and would recommend [facility].     Follow Up Recommendations  Other (comment) (Pt is a high fall risk. Would benefit from CIR vs. SNF, if unable will need HHOT and 24/7 Supervision)    Equipment Recommendations  3 in 1 bedside comode    Recommendations for Other Services       Precautions / Restrictions Precautions Precautions: Fall Precaution Comments: prior CVA with L sided weakness; hx of recurrent falls; vertigo? Required Braces or Orthoses: Other Brace/Splint Other Brace/Splint: boot on Left foot Restrictions Weight Bearing Restrictions: No      Mobility Bed Mobility Overal bed mobility: Needs Assistance Bed Mobility: Supine to Sit     Supine to sit: Min assist     General bed mobility comments: Cues for technique; min assist for steadying; Pt reports feeling "seasick" upon initial sit  Transfers Overall transfer level: Needs assistance Equipment used: Rolling walker (2 wheeled) Transfers: Sit to/from Stand Sit to Stand: Mod assist         General transfer comment: light mod assist to power up; unable to stand without assist; closes her eyes upon initial stand, and informs [place] that she has to close her eyes and wait before she goes anywhere    Balance Overall balance assessment:  History of Falls           Standing balance-Leahy Scale: Poor Standing balance comment: Heavy reliance on UE suppot                            ADL Overall ADL's : Needs assistance/impaired Eating/Feeding: Independent;Sitting   Grooming: Set up;Sitting   Upper Body Bathing: Set up;Sitting   Lower Body Bathing: Moderate assistance;Sit to/from stand;+2 for safety/equipment   Upper Body Dressing : Set up;Sitting   Lower Body Dressing: Maximal assistance;Sit to/from stand;+2 for safety/equipment   Toilet Transfer: Moderate assistance;+2 for safety/equipment;Stand-pivot;RW;BSC   Toileting- Clothing Manipulation and Hygiene: Moderate assistance;+2 for physical assistance;Sit to/from stand         General ADL Comments: Pt presents with generalized weakness and increased fall risk.                Pertinent Vitals/Pain Pain Assessment: No/denies pain        Extremity/Trunk Assessment Upper Extremity Assessment Upper Extremity Assessment: Generalized weakness   Lower Extremity Assessment Lower Extremity Assessment: Generalized weakness   Cervical / Trunk Assessment Cervical / Trunk Assessment: Normal   Communication Communication Communication: No difficulties   Cognition Arousal/Alertness: Awake/alert Behavior During Therapy: WFL for tasks assessed/performed Overall Cognitive Status: Within Functional Limits for tasks assessed                                Home Living  Family/patient expects to be discharged to:: Private residence Living Arrangements: Alone Available Help at Discharge: Family;Available PRN/intermittently Type of Home: House (Townhouse) Home Access: Ramped entrance     Home Layout: One level     Bathroom Shower/Tub: Tub/shower unit Shower/tub characteristics: Curtain Biochemist, clinical: Handicapped height     Home Equipment: Cane - single point;Grab bars - toilet;Grab bars - tub/shower;Shower seat           Prior Functioning/Environment Level of Independence: Independent        Comments: Reports independent, no device until several weeks ago when falls started; within the past few weeks she has had about 4 falls per day, most falls not to floor, but to chair/couch    OT Diagnosis: Generalized weakness   OT Problem List: Decreased strength;Decreased activity tolerance;Impaired balance (sitting and/or standing);Decreased safety awareness;Decreased knowledge of use of DME or AE;Decreased knowledge of precautions   OT Treatment/Interventions: Self-care/ADL training;Therapeutic exercise;Neuromuscular education;Energy conservation;DME and/or AE instruction;Therapeutic activities;Patient/family education;Balance training    OT Goals(Current goals can be found in the care plan section) Acute Rehab OT Goals Patient Stated Goal: Wants to be able to get up and around OT Goal Formulation: With patient Time For Goal Achievement: 01/23/15 Potential to Achieve Goals: Good ADL Goals Pt Will Perform Lower Body Bathing: with min guard assist;sit to/from stand Pt Will Perform Lower Body Dressing: with min guard assist;sit to/from stand Pt Will Transfer to Toilet: with min guard assist;ambulating;bedside commode Pt Will Perform Toileting - Clothing Manipulation and hygiene: with min guard assist;sit to/from stand  OT Frequency: Min 2X/week   Barriers to D/C: Decreased caregiver support          Co-evaluation PT/OT/SLP Co-Evaluation/Treatment: Yes Reason for Co-Treatment: For patient/therapist safety PT goals addressed during session: Mobility/safety with mobility;Balance;Proper use of DME OT goals addressed during session: ADL's and self-care      End of Session Equipment Utilized During Treatment: Gait belt;Rolling walker;Other (comment) (LLE boot)  Activity Tolerance: Patient tolerated treatment well Patient left: in bed;with call bell/phone within reach;Other (comment) (with Echo in room)    Time: [id number] OT Time Calculation (min): 37 min Charges:  OT General Charges $OT Visit: 1 Procedure OT Evaluation $Initial OT Evaluation Tier I: 1 Procedure G-Codes: OT G-codes **NOT FOR INPATIENT CLASS** Functional Assessment Tool Used: clinical judgement Functional Limitation: Self care Self Care Current Status [id number]): At least 40 percent but less than 60 percent impaired, limited or restricted Self Care Goal Status [id number]): At least 1 percent but less than 20 percent impaired, limited or restricted  Juluis Rainier 01/09/2015, 1:35 PM   Cyndie Chime, OTR/L Occupational Therapist [phone] (pager)

## 2015-01-10 DIAGNOSIS — E43 Unspecified severe protein-calorie malnutrition: Secondary | ICD-10-CM | POA: Insufficient documentation

## 2015-01-10 DIAGNOSIS — I10 Essential (primary) hypertension: Secondary | ICD-10-CM | POA: Diagnosis not present

## 2015-01-10 DIAGNOSIS — I2510 Atherosclerotic heart disease of native coronary artery without angina pectoris: Secondary | ICD-10-CM | POA: Diagnosis not present

## 2015-01-10 DIAGNOSIS — R011 Cardiac murmur, unspecified: Secondary | ICD-10-CM | POA: Diagnosis not present

## 2015-01-10 DIAGNOSIS — J449 Chronic obstructive pulmonary disease, unspecified: Secondary | ICD-10-CM | POA: Diagnosis not present

## 2015-01-10 DIAGNOSIS — I69998 Other sequelae following unspecified cerebrovascular disease: Secondary | ICD-10-CM | POA: Diagnosis not present

## 2015-01-10 DIAGNOSIS — M6281 Muscle weakness (generalized): Secondary | ICD-10-CM | POA: Diagnosis not present

## 2015-01-10 DIAGNOSIS — K5090 Crohn's disease, unspecified, without complications: Secondary | ICD-10-CM | POA: Diagnosis not present

## 2015-01-10 DIAGNOSIS — D649 Anemia, unspecified: Secondary | ICD-10-CM | POA: Diagnosis not present

## 2015-01-10 DIAGNOSIS — R55 Syncope and collapse: Secondary | ICD-10-CM | POA: Diagnosis not present

## 2015-01-10 DIAGNOSIS — R296 Repeated falls: Secondary | ICD-10-CM | POA: Diagnosis not present

## 2015-01-10 HISTORY — DX: Unspecified severe protein-calorie malnutrition: E43

## 2015-01-10 LAB — BASIC METABOLIC PANEL
Anion gap: 12 (ref 5–15)
BUN: 13 mg/dL (ref 6–23)
CALCIUM: 8.5 mg/dL (ref 8.4–10.5)
CO2: 22 mmol/L (ref 19–32)
Chloride: 107 mmol/L (ref 96–112)
Creatinine, Ser: 1.09 mg/dL (ref 0.50–1.10)
GFR calc Af Amer: 51 mL/min — ABNORMAL LOW (ref >90)
GFR calc non Af Amer: 44 mL/min — ABNORMAL LOW (ref >90)
GLUCOSE: 103 mg/dL — AB (ref 70–99)
POTASSIUM: 3.7 mmol/L (ref 3.5–5.1)
Sodium: 141 mmol/L (ref 135–145)

## 2015-01-10 LAB — CBC
HEMATOCRIT: 35.6 % — AB (ref 36.0–46.0)
HEMOGLOBIN: 10.9 g/dL — AB (ref 12.0–15.0)
MCH: 25.8 pg — AB (ref 26.0–34.0)
MCHC: 30.6 g/dL (ref 30.0–36.0)
MCV: 84.2 fL (ref 78.0–100.0)
Platelets: 254 10*3/uL (ref 150–400)
RBC: 4.23 MIL/uL (ref 3.87–5.11)
RDW: 16.1 % — ABNORMAL HIGH (ref 11.5–15.5)
WBC: 6.5 10*3/uL (ref 4.0–10.5)

## 2015-01-10 NOTE — Clinical Social Work Psychosocial (Signed)
Clinical Social Work Department BRIEF PSYCHOSOCIAL ASSESSMENT 01/10/2015  Patient:  Kristie Morris, Kristie Morris     Account Number:  [id number]     Admit date:  01/08/2015  Clinical Social Worker:  Gwenevere Abbot,  Date/Time:  01/10/2015 11:00 AM  Referred by:  Physician  Date Referred:   Referred for  SNF Placement   Other Referral:   Interview type:  Patient Other interview type:    PSYCHOSOCIAL DATA Living Status:  FAMILY Admitted from facility:   Level of care:   Primary support name:  Kristie Morris Primary support relationship to patient:  CHILD, ADULT Degree of support available:   Kristie Morris  [phone]    CURRENT CONCERNS Current Concerns  Post-Acute Placement   Other Concerns:    SOCIAL WORK ASSESSMENT / PLAN CSW-Intern spoke with Kristie Morris who was up in chair, regarding short-term rehab. Patient quickly refused and stated "I rather die first". 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 Patient reported that many of them didn't receive good care .CS W-Intern assured patient that not all facilities are alike and there will be other facilities to choose from. Patient still adamantly refused. Patient is requesting she receive home health care services.  Kristie Morris reported that her grandson and his wife doesn't live in the home, however her son Kristie Morris stated that they both do indeed live there.    Kristie Morris son Kristie Morris entered the room and asked "what is the plan?". Patient reported that  she wants to go home to receive her  physical therapy. [place] quickly disagreed and began to get upset. Kristie Morris was explaining to his mother, that her grandson that lives with her and "acts like he is unable to help her at home." Kristie Morris continued to say that's why she fell trying to get to the bathroom. Kristie Morris then stated "I tripped over something in my room and that wasn't the reason why I fell."    As the  conversation continued CSW-Intern noted that her son Kristie Morris was upset while speaking to his mother. Kristie Morris looked embarrassed as well. While was her son was abruptly leaving the room, he stated " Well go home and die, I will see you at the funeral".   Assessment/plan status:  Psychosocial Support/Ongoing Assessment of Needs Other assessment/ plan:   Information/referral to community resources:   None needed or requested at this time.    PATIENT'S/FAMILY'S RESPONSE TO PLAN OF CARE: Patient was open and responsive speaking with CSW-Intern. Although Kristie Morris is refusing rehab, she prefers to do physical therapy at home. CSW and CSW-Intern will follow.       Gwenevere Abbot CSW-Intern [phone]

## 2015-01-10 NOTE — Progress Notes (Signed)
Physical Therapy Treatment Patient Details Name: KADEEJA RISTINE MRN: [id number] DOB: 1925/03/07 Today's Date: 01/10/2015    History of Present Illness Pt is an 81 y.o. Female with PMH of CVA with Left sided weakness, diastolic dysfunction, CAD, COPD, gait instability, and recent L metatarsal fx after a fall, admitted 01/08/15 with recurrent falls and presyncope.     PT Comments    Patient demonstrates unsafe use of RW during gait, with decreased balance posteriorly. With repeated cues to keep RW close, patient continues to place RW too far ahead of herself.  Patient unable to move supine <> sit and sit <> stand without assistance.  Feel patient would benefit from SNF at discharge for continued therapy.  However, unsure of the potential for this plan due to patient's Observation status.  If patient discharges home, will need 24 hour assistance for safety, and HHPT f/u.   Follow Up Recommendations  SNF;Supervision/Assistance - 24 hour     Equipment Recommendations  Rolling walker with 5" wheels;3in1 (PT)    Recommendations for Other Services       Precautions / Restrictions Precautions Precautions: Fall Precaution Comments: Patient loses balance posteriorly even with RW.  Unsafe use of RW.  Lt-sided weakness from CVA.  Multiple falls at home.  Reports dizziness. Required Braces or Orthoses: Other Brace/Splint Other Brace/Splint: boot on Left foot Restrictions Weight Bearing Restrictions: No    Mobility  Bed Mobility Overal bed mobility: Needs Assistance Bed Mobility: Supine to Sit;Sit to Supine     Supine to sit: Min assist;HOB elevated Sit to supine: Mod assist   General bed mobility comments: Verbal cues for technique.  Assist to raise trunk to sitting position and to scoot to EOB once upright.  Patient losing balance posteriorly.  Required max assist to don boot on Lt foot and mod assist to don shoe on Rt foot.  When returning to supine, paitent "flopped" onto back across bed.   Patient unable to get LLE onto bed - required mod assist.  Placed bed in trendelenberg and patient using rails to scoot up to [facility].  Transfers Overall transfer level: Needs assistance Equipment used: Rolling walker (2 wheeled) Transfers: Sit to/from Stand Sit to Stand: Mod assist         General transfer comment: Verbal cues for hand placement.  Assist to power up to standing at bed and at toilet.  Required verbal cues to problem solve backing up to bed to sit down on bed.  Ambulation/Gait Ambulation/Gait assistance: Min assist Ambulation Distance (Feet): 28 Feet Assistive device: Rolling walker (2 wheeled) Gait Pattern/deviations: Step-through pattern;Decreased step length - right;Decreased step length - left;Decreased stride length;Decreased weight shift to left;Shuffle;Trunk flexed Gait velocity: slow Gait velocity interpretation: Below normal speed for age/gender General Gait Details: Multiple repeated cues to keep RW close and to stand upright during gait.  Patient taking short, shuffing steps.  Unsafe use of RW throughout, keeping RW too far ahead of her.  When backing up to toilet, patient with loss of balance posteriorly.   Stairs            Wheelchair Mobility    Modified Rankin (Stroke Patients Only)       Balance Overall balance assessment: Needs assistance;History of Falls Sitting-balance support: No upper extremity supported;Feet supported Sitting balance-Leahy Scale: Fair   Postural control: Posterior lean;Other (comment) (Especially with any dynamic activity - donning shoes) Standing balance support: Bilateral upper extremity supported Standing balance-Leahy Scale: Poor Standing balance comment: Several episodes of posterior  loss of balance                    Cognition Arousal/Alertness: Awake/alert Behavior During Therapy: Anxious;Impulsive Overall Cognitive Status: Within Functional Limits for tasks assessed (Decreased safety awareness)        Memory: Decreased short-term memory (confused about discharge today)              Exercises      General Comments        Pertinent Vitals/Pain Pain Assessment: No/denies pain    Home Living                      Prior Function            PT Goals (current goals can now be found in the care plan section) Progress towards PT goals: Progressing toward goals    Frequency  Min 4X/week    PT Plan Discharge plan needs to be updated    Co-evaluation             End of Session Equipment Utilized During Treatment: Gait belt Activity Tolerance: Patient limited by fatigue Patient left: in bed;with call bell/phone within reach;with bed alarm set     Time: [id number] PT Time Calculation (min) (ACUTE ONLY): 39 min  Charges:  $Gait Training: 23-37 mins $Therapeutic Activity: 8-22 mins                    G Codes:      Despina Pole Jan 14, 2015, 5:19 PM Carita Pian. Sanjuana Kava, [place] Pager [phone]

## 2015-01-10 NOTE — Discharge Summary (Signed)
Physician Discharge Summary  Kristie Morris [id number] DOB: Feb 28, 1925 DOA: 01/08/2015  PCP: Alonza Bogus, MD  Admit date: 01/08/2015 Discharge date: 01/10/2015  Time spent: 30 minutes  Recommendations for Outpatient Follow-up:  1. Follow up with PCP and neurology as recommended.   Discharge Diagnoses:  Active Problems:   Recurrent falls   Pre-syncope   Protein-calorie malnutrition, severe   Discharge Condition: improved  Diet recommendation: low sodium diet  Filed Weights   01/08/15 2108 01/09/15 2041  Weight: 91.763 kg (202 lb 4.8 oz) 92.398 kg (203 lb 11.2 oz)    History of present illness:  81 y.o. Female with known history of CVA and residual left sided weakness and unsteady gait, chronic diastolic CHF, CAD, COPD, presented to [facility] ED with main concerns of recurrent episodes of falls near syncopal event that occurred one day prior to this admission.  Hospital Course:  Recurrent falls/near syncopal events  Secondary to orthostatic hypotension. - MRI brain is negative for acute pathology and 2 D ECHO shows grade 1 diastolic dysfunction. - PT evaluation done and pt determined she needs supervision and rehab.we have stopped her lasix on discharge   Acuter renal failure - continue gentle hydration  Renal parameters improved.   Diastolic dysfunction, chronic  - 2D ECHO 6/20145 w/ EF 55-60% w/ grade 1 diastolic dysfunction - euvolemic clinically  Repeat echo is similar to the one in 2015.   HTN - elevated on presentation though self resolved - cont low dose norvasc  COPD - resp status stable  - no wheezing, increased WOB - CXR WNL  - cont home regimen, folllow   Morbid obesity - pt meets criteria with BMI 35. 84 with underlying HTN, COPD, CVA  Procedures:  Echocardiogram  MRI Brain  Consultations:  none  Discharge Exam: Filed Vitals:   01/10/15 0534  BP: 150/90  Pulse:   Temp:   Resp:     General: ALERT AFEBRILE  comfortable Cardiovascular: s1s2 Respiratory: ctab  Discharge Instructions   Discharge Instructions    Diet - low sodium heart healthy    Complete by:  As directed      Discharge instructions    Complete by:  As directed   Follow up with PCP in one week.          Current Discharge Medication List    CONTINUE these medications which have NOT CHANGED   Details  amLODipine (NORVASC) 5 MG tablet Take 5 mg AM and 5 mg PM Qty: 180 tablet, Refills: 3    aspirin EC 81 MG tablet Take 81 mg by mouth daily.    dicyclomine (BENTYL) 20 MG tablet Take 20 mg by mouth 2 (two) times daily.     esomeprazole (NEXIUM) 20 MG capsule Take 20 mg by mouth daily at 12 noon.    Fluticasone-Salmeterol (ADVAIR) 250-50 MCG/DOSE AEPB Inhale 1 puff into the lungs 2 (two) times daily.    midodrine (PROAMATINE) 5 MG tablet Take 5 mg by mouth 2 (two) times daily.    Multiple Vitamins-Minerals (PRESERVISION AREDS PO) Take 1 tablet by mouth daily.    Probiotic Product (ALIGN PO) Take 1 capsule by mouth daily.    VESICARE 10 MG tablet Take 10 mg by mouth daily.    potassium chloride SA (KLOR-CON M20) 20 MEQ tablet Take with Lasix as needed Qty: 30 tablet, Refills: 3      STOP taking these medications     aspirin 325 MG tablet      furosemide (LASIX)  20 MG tablet      traMADol (ULTRAM) 50 MG tablet        Allergies  Allergen Reactions  . Zithromax [Azithromycin] Diarrhea    Pt in hosp for 2 weeks   . Celebrex [Celecoxib] Hives  . Ciprofloxacin Hives  . Penicillins Hives  . Polymyxin B Hives  . Sulfa Antibiotics Hives  . Vancomycin Hives  . Vioxx [Rofecoxib] Hives  . Metronidazole       The results of significant diagnostics from this hospitalization (including imaging, microbiology, ancillary and laboratory) are listed below for reference.    Significant Diagnostic Studies: Dg Chest 2 View  01/08/2015   CLINICAL DATA:  Cough, shortness of Breath  EXAM: CHEST  2 VIEW  COMPARISON:   03/17/2014  FINDINGS: Cardiomediastinal silhouette is stable. Bilateral axillary surgical clips are again noted. No acute infiltrate or pleural effusion. Atherosclerotic calcifications of thoracic aorta again noted. Osteopenia and mild degenerative changes thoracic spine.  IMPRESSION: No active cardiopulmonary disease. Atherosclerotic calcifications of thoracic aorta.   Electronically Signed   By: Lahoma Crocker M.D.   On: 01/08/2015 16:46   Dg Lumbar Spine Complete  01/08/2015   CLINICAL DATA:  Fall, back injury 8 days ago  EXAM: Brunson 4+ VIEW  COMPARISON:  None.  FINDINGS: Five views of lumbar spine submitted. There is diffuse osteopenia. Disc space flattening with minimal anterior spurring at L3-L4 level. Disc space flattening at L4-L5 level. Minimal disc space flattening at L5-S1 level. Atherosclerotic calcifications of abdominal aorta.  IMPRESSION: Diffuse osteopenia. No acute fracture or subluxation. Degenerative changes as described above.   Electronically Signed   By: Lahoma Crocker M.D.   On: 01/08/2015 16:47   Ct Head Wo Contrast  01/08/2015   CLINICAL DATA:  Pain following falls.  Unsteady gait  EXAM: CT HEAD WITHOUT CONTRAST  CT CERVICAL SPINE WITHOUT CONTRAST  TECHNIQUE: Multidetector CT imaging of the head and cervical spine was performed following the standard protocol without intravenous contrast. Multiplanar CT image reconstructions of the cervical spine were also generated.  COMPARISON:  Brain MRI July 11, 2014  FINDINGS: CT HEAD FINDINGS  Moderate diffuse atrophy is stable. There is no intracranial mass, hemorrhage, extra-axial fluid collection, or midline shift. There is small vessel disease in the centra semiovale bilaterally. There is evidence of a prior small infarct in the inferior right centrum semiovale, stable. There is scattered of basal ganglia calcification. There is no new gray-white compartment lesion. No acute infarct apparent. Bony calvarium appears intact. The  mastoid air cells are clear. There is frontal hyperostosis bilaterally.  CT CERVICAL SPINE FINDINGS  There is no fracture or spondylolisthesis. Prevertebral soft tissues and predental space regions are normal. There is ankylosis at C5-6. There is moderately severe disc space narrowing at C4-5 and C6-7. There is milder narrowing at C7-T1. There is multilevel facet hypertrophy. There is exit foraminal narrowing due to bony hypertrophy at C5-6 bilaterally. No disc extrusion or stenosis.  There are foci of carotid artery calcification bilaterally. There are several nodular opacities in the thyroid, largest measuring 1.2 x 1.1 cm in the left lobe.  IMPRESSION: CT head: Atrophy with periventricular small vessel disease. Prior infarct inferior right centrum semiovale. No intracranial mass, hemorrhage, or extra-axial fluid collection. No acute appearing infarct.  CT cervical spine: No fracture or spondylolisthesis. Multilevel arthropathy. Bones osteoporotic. There are foci of calcification in each carotid artery. Thyroid is inhomogeneous with nodular lesions within the thyroid. Recommend further evaluation with thyroid ultrasound.  If patient is clinically hyperthyroid, consider nuclear medicine thyroid uptake and scan.   Electronically Signed   By: Lowella Grip III M.D.   On: 01/08/2015 16:25   Ct Cervical Spine Wo Contrast  01/08/2015   CLINICAL DATA:  Pain following falls.  Unsteady gait  EXAM: CT HEAD WITHOUT CONTRAST  CT CERVICAL SPINE WITHOUT CONTRAST  TECHNIQUE: Multidetector CT imaging of the head and cervical spine was performed following the standard protocol without intravenous contrast. Multiplanar CT image reconstructions of the cervical spine were also generated.  COMPARISON:  Brain MRI July 11, 2014  FINDINGS: CT HEAD FINDINGS  Moderate diffuse atrophy is stable. There is no intracranial mass, hemorrhage, extra-axial fluid collection, or midline shift. There is small vessel disease in the centra  semiovale bilaterally. There is evidence of a prior small infarct in the inferior right centrum semiovale, stable. There is scattered of basal ganglia calcification. There is no new gray-white compartment lesion. No acute infarct apparent. Bony calvarium appears intact. The mastoid air cells are clear. There is frontal hyperostosis bilaterally.  CT CERVICAL SPINE FINDINGS  There is no fracture or spondylolisthesis. Prevertebral soft tissues and predental space regions are normal. There is ankylosis at C5-6. There is moderately severe disc space narrowing at C4-5 and C6-7. There is milder narrowing at C7-T1. There is multilevel facet hypertrophy. There is exit foraminal narrowing due to bony hypertrophy at C5-6 bilaterally. No disc extrusion or stenosis.  There are foci of carotid artery calcification bilaterally. There are several nodular opacities in the thyroid, largest measuring 1.2 x 1.1 cm in the left lobe.  IMPRESSION: CT head: Atrophy with periventricular small vessel disease. Prior infarct inferior right centrum semiovale. No intracranial mass, hemorrhage, or extra-axial fluid collection. No acute appearing infarct.  CT cervical spine: No fracture or spondylolisthesis. Multilevel arthropathy. Bones osteoporotic. There are foci of calcification in each carotid artery. Thyroid is inhomogeneous with nodular lesions within the thyroid. Recommend further evaluation with thyroid ultrasound. If patient is clinically hyperthyroid, consider nuclear medicine thyroid uptake and scan.   Electronically Signed   By: Lowella Grip III M.D.   On: 01/08/2015 16:25   Mr Brain Wo Contrast  01/09/2015   CLINICAL DATA:  Multiple falls. Presyncope. Dizziness. Symptoms for at least 6 weeks. Initial encounter.  EXAM: MRI HEAD WITHOUT CONTRAST  TECHNIQUE: Multiplanar, multiecho pulse sequences of the brain and surrounding structures were obtained without intravenous contrast.  COMPARISON:  CT head 06/10/2015.  MR head  07/11/2014.  FINDINGS: No evidence for acute infarction, hemorrhage, mass lesion, or extra-axial fluid. Hydrocephalus ex vacuo. Large remote chronic lacunar infarction RIGHT centrum semiovale with surrounding gliosis and chronic blood products.  Severe cerebral and cerebellar atrophy. Extensive chronic microvascular ischemic change throughout the white matter. Flow voids are maintained. Extracranial soft tissues unremarkable.  No discrete midline abnormality. Within limits for evaluation on MR, no discernible posttraumatic sequelae.  IMPRESSION: Chronic changes as described. Similar appearance to 2015. No acute intracranial findings.   Electronically Signed   By: Rolla Flatten M.D.   On: 01/09/2015 15:36   Dg Foot Complete Left  12/13/2014   CLINICAL DATA:  Left foot pain post fall last night  EXAM: LEFT FOOT - COMPLETE 3+ VIEW  COMPARISON:  None.  FINDINGS: Three views of the left foot submitted. There is oblique mild displaced fracture of the fifth metatarsal. No radiopaque foreign body.  IMPRESSION: Oblique mild displaced fracture of fifth metatarsal. No radiopaque foreign body.   Electronically Signed   By:  Lahoma Crocker M.D.   On: 12/13/2014 15:10    Microbiology: No results found for this or any previous visit (from the past 240 hour(s)).   Labs: Basic Metabolic Panel:  Recent Labs Lab 01/08/15 1512 01/08/15 1525 01/08/15 2135 01/09/15 0502 01/09/15 1150 01/10/15 0619  NA 143 142  --  139 142 141  K 3.4* 3.3*  --  4.8 3.9 3.7  CL 104 101  --  103 104 107  CO2 27  --   --  23 30 22   GLUCOSE 100* 101*  --  81 108* 103*  BUN 13 16  --  15 12 13   CREATININE 1.18* 1.10 1.12* 1.16* 1.20* 1.09  CALCIUM 9.2  --   --  8.7 8.9 8.5  MG  --   --  1.9  --   --   --    Liver Function Tests:  Recent Labs Lab 01/09/15 0502 01/09/15 1150  AST 40* 29  ALT 21 25  ALKPHOS 122* 139*  BILITOT 1.5* 0.5  PROT 4.9* 6.2  ALBUMIN 3.1* 3.4*   No results for input(s): LIPASE, AMYLASE in the last 168  hours. No results for input(s): AMMONIA in the last 168 hours. CBC:  Recent Labs Lab 01/08/15 1512 01/08/15 1525 01/08/15 2135 01/09/15 0747 01/10/15 0619  WBC 8.7  --  10.0 7.4 6.5  NEUTROABS 6.2  --   --  5.2  --   HGB 12.4 14.3 12.2 12.2 10.9*  HCT 39.6 42.0 39.0 39.3 35.6*  MCV 83.0  --  82.6 84.7 84.2  PLT 330  --  303 307 254   Cardiac Enzymes: No results for input(s): CKTOTAL, CKMB, CKMBINDEX, TROPONINI in the last 168 hours. BNP: BNP (last 3 results)  Recent Labs  01/08/15 2135  BNP 75.9    ProBNP (last 3 results)  Recent Labs  01/31/14 1930 03/17/14 2238  PROBNP 1055.0* 127.9    CBG: No results for input(s): GLUCAP in the last 168 hours.     SignedHosie Poisson  [facility] 01/10/2015, 6:48 PM

## 2015-01-10 NOTE — Progress Notes (Signed)
Orthostatic vitals sign equals 179/146 (lying), 205/104 (sitting) and 215/127 (standing ). Manual blood pressure afterwards equals 150/90 (lying). NP on call notified. Will continue to monitor.

## 2015-01-10 NOTE — Progress Notes (Signed)
UR completed 

## 2015-01-14 ENCOUNTER — Telehealth: Payer: Self-pay | Admitting: Neurology

## 2015-01-14 NOTE — Telephone Encounter (Signed)
Pt's grandson is calling wanting to get results of pt's MRI that she had at the hospital.  Please call and advise.

## 2015-01-14 NOTE — Telephone Encounter (Signed)
I returned Kristie Morris's call and apologized for not being able to release results from a test/procedure ordered by another provider.

## 2015-01-16 ENCOUNTER — Ambulatory Visit: Payer: Commercial Managed Care - HMO | Admitting: Cardiovascular Disease

## 2015-01-17 ENCOUNTER — Ambulatory Visit (INDEPENDENT_AMBULATORY_CARE_PROVIDER_SITE_OTHER): Payer: Commercial Managed Care - HMO | Admitting: Neurology

## 2015-01-17 ENCOUNTER — Encounter: Payer: Self-pay | Admitting: Neurology

## 2015-01-17 ENCOUNTER — Telehealth: Payer: Self-pay

## 2015-01-17 VITALS — BP 163/89 | HR 91 | Ht 62.0 in

## 2015-01-17 DIAGNOSIS — M6289 Other specified disorders of muscle: Secondary | ICD-10-CM

## 2015-01-17 DIAGNOSIS — E538 Deficiency of other specified B group vitamins: Secondary | ICD-10-CM

## 2015-01-17 DIAGNOSIS — R269 Unspecified abnormalities of gait and mobility: Secondary | ICD-10-CM

## 2015-01-17 DIAGNOSIS — R51 Headache: Secondary | ICD-10-CM

## 2015-01-17 DIAGNOSIS — R531 Weakness: Secondary | ICD-10-CM

## 2015-01-17 DIAGNOSIS — R519 Headache, unspecified: Secondary | ICD-10-CM | POA: Insufficient documentation

## 2015-01-17 DIAGNOSIS — M6281 Muscle weakness (generalized): Secondary | ICD-10-CM | POA: Diagnosis not present

## 2015-01-17 HISTORY — DX: Headache, unspecified: R51.9

## 2015-01-17 NOTE — Telephone Encounter (Signed)
Okay to give patient xanax, per protocol, per Dr. Jannifer Franklin.

## 2015-01-17 NOTE — Progress Notes (Signed)
Reason for visit: Gait disorder  Kristie Morris is an 81 y.o. female  History of present illness:  Kristie Morris is an 81 year old right-handed white female with a history of left-sided weakness, the patient was evaluated for this previously, felt to have a right brain stroke. The patient last had MRI evaluation in October 2015 showing chronic bilateral small vessel disease. The patient improved with her ability to walk and get around, and she indicates that in early part of 2016, she was walking without any assistance and was doing quite well. The patient fell and fractured her left foot on 12/12/2014. Since that time, the patient has noted a rapid change in her ability to walk. She has a tendency to lean backwards, or lean slightly to the left. She has had multiple other falls. The patient fell in the bathroom on 21st of March, and bumped the back of her head. The patient has had some low back pain since that time. She denies any pain going down the legs. She has been in the hospital for an evaluation, recently discharged on 01/10/2015. MRI evaluation in the hospital showed no change in the head scan as compared to the one done in October 2015. The patient denies significant neck pain. She denies pain down the arms or legs. A CT scan of the cervical spine did not show fracture. She is sent to this office for further evaluation. She does have some slight dizziness with standing, she is on midodrine for orthostatic hypotension. She has difficulty even sitting by himself, as she tends to lean backwards. She is sent to this office for an evaluation.  Past Medical History  Diagnosis Date  . Hypertension   . Asthma   . Crohn disease   . COPD (chronic obstructive pulmonary disease)   . Malignant hypertension 02/02/2014  . Diastolic dysfunction A999333  . Anemia 02/02/2014    Anemia panel WNL  . CAD (coronary artery disease) 2003    Cath with nonobstructive CAD  . Abnormality of gait 01/08/2015  . Heart  murmur   . CHF (congestive heart failure)   . Pneumonia 2014; 2015  . History of blood transfusion     "I had 7; related to packing left inside me when I had a D&C; got blood poisoning"  . GERD (gastroesophageal reflux disease)   . Daily headache     "this last few months" (01/08/2015)  . Migraine     "when I was a young woman" (01/08/2015)  . Stroke 03/2014    "left leg weak; lost of strength in my left side; slight tremors; some speach problems since" (01/08/2015)  . Arthritis     "left knee" (01/08/2015)  . Fibromyalgia   . Chronic lower back pain   . Anxiety   . Breast cancer     "I've had it in both breasts"  . Macular hole, both eyes     "both wet and dry"  . OAB (overactive bladder)     Past Surgical History  Procedure Laterality Date  . Mastectomy, partial Right 1999  . Back surgery    . Knee arthroscopy Right 2005  . Abdominal hysterectomy  1954  . Ovarian cyst removal  1946  . Cataract extraction w/ intraocular lens implant Right 2008  . Dilation and curettage of uterus  1953    S/P miscarriage  . Hemorrhoid surgery  1958  . Anterior cervical decomp/discectomy fusion  1971    "used bone off of my right hip"  .  Abdominal adhesion surgery  1993    from colon  . Breast lumpectomy Left 2006  . Patella fracture surgery Left 2008  . Cataract extraction w/ intraocular lens implant Left 2015  . Foot fracture surgery Left 12/2014  . Fracture surgery    . Cardiac catheterization  11/2001    Family History  Problem Relation Age of Onset  . Heart attack Mother   . CAD Mother   . Heart attack Sister   . Cancer - Ovarian Sister   . Cancer - Colon Father     Social history:  reports that she has quit smoking. Her smoking use included Cigarettes. She has a 7.5 pack-year smoking history. She has never used smokeless tobacco. She reports that she does not drink alcohol or use illicit drugs.    Allergies  Allergen Reactions  . Zithromax [Azithromycin] Diarrhea    Pt in hosp  for 2 weeks   . Celebrex [Celecoxib] Hives  . Ciprofloxacin Hives  . Penicillins Hives  . Polymyxin B Hives  . Sulfa Antibiotics Hives  . Vancomycin Hives  . Vioxx [Rofecoxib] Hives  . Metronidazole     Medications:  Prior to Admission medications   Medication Sig Start Date End Date Taking? Authorizing Provider  albuterol (PROVENTIL HFA;VENTOLIN HFA) 108 (90 BASE) MCG/ACT inhaler Inhale 2 puffs into the lungs every 6 (six) hours as needed for wheezing or shortness of breath.   Yes Historical Provider, MD  ALPRAZolam (XANAX) 0.25 MG tablet Take 0.25 mg by mouth 2 (two) times daily as needed for anxiety.   Yes Historical Provider, MD  amLODipine (NORVASC) 5 MG tablet Take 5 mg AM and 5 mg PM Patient taking differently: Take 5 mg by mouth daily. Take 5 mg AM and 5 mg PM 09/14/14  Yes Herminio Commons, MD  aspirin EC 81 MG tablet Take 81 mg by mouth daily.   Yes Historical Provider, MD  dicyclomine (BENTYL) 20 MG tablet Take 20 mg by mouth daily.    Yes Historical Provider, MD  esomeprazole (NEXIUM) 20 MG capsule Take 20 mg by mouth daily at 12 noon.   Yes Historical Provider, MD  Fluticasone-Salmeterol (ADVAIR) 250-50 MCG/DOSE AEPB Inhale 1 puff into the lungs 2 (two) times daily.   Yes Historical Provider, MD  ibuprofen (ADVIL,MOTRIN) 200 MG tablet Take 200 mg by mouth every 6 (six) hours as needed.   Yes Historical Provider, MD  midodrine (PROAMATINE) 5 MG tablet Take 5 mg by mouth 2 (two) times daily. 01/02/15  Yes Historical Provider, MD  Multiple Vitamins-Minerals (PRESERVISION AREDS PO) Take 1 tablet by mouth daily.   Yes Historical Provider, MD  Probiotic Product (ALIGN PO) Take 1 capsule by mouth daily.   Yes Historical Provider, MD  traMADol (ULTRAM) 50 MG tablet Take 50 mg by mouth every 6 (six) hours as needed.   Yes Historical Provider, MD  VESICARE 10 MG tablet Take 10 mg by mouth daily. 01/03/15  Yes Historical Provider, MD    ROS:  Out of a complete 14 system review of  symptoms, the patient complains only of the following symptoms, and all other reviewed systems are negative.  Ear pain, ringing in the ears Loss of vision Incontinence of bladder, urinary urgency Back pain, joint pain, walking difficulty Swollen lymph nodes Dizziness, headache, speech difficulty, weakness  Blood pressure 163/89, pulse 91, height 5\' 2"  (1.575 m).   Blood pressure standing is A999333 systolic, sitting 0000000 systolic.  Physical Exam  General: The patient is  alert and cooperative at the time of the examination. The patient is moderately to markedly obese.  Skin: 2+ edema below the knees is noted bilaterally.   Neurologic Exam  Mental status: The patient is alert and oriented x 3 at the time of the examination. The patient has apparent normal recent and remote memory, with an apparently normal attention span and concentration ability.   Cranial nerves: Facial symmetry is present. Speech is normal, no aphasia or dysarthria is noted. Extraocular movements are full. Visual fields are full. Mild masking of the face is seen.  Motor: The patient has good strength in all 4 extremities, with exception that there is 4/5 strength of the left lower extremity.  Sensory examination: Soft touch sensation is symmetric on the face, arms, and legs.  Coordination: The patient has good finger-nose-finger and heel-to-shin bilaterally, with exception that there is slight dysmetria with finger-nose-finger on the left.  Gait and station: The patient requires assistance with standing. Once up, she has a tendency to lean backwards, she cannot stand independently. She can only take a step or 2 with assistance.  Reflexes: Deep tendon reflexes are symmetric.   MRI brain 01/09/15:  IMPRESSION: Chronic changes as described. Similar appearance to 2015. No acute intracranial findings.  * MRI scan images were reviewed online. I agree with the written report.   Assessment/Plan:  1. Gait  disorder  2. Mild left hemiparesis  The patient has had a relatively sudden change in her ability to walk. She indicates that she was walking independently until her fall and March 2016. Since that time, she has had multiple falls, and she is unable to even stand without assistance with a tendency to lean backwards. MRI of the brain has shown no evidence of an acute stroke. The history of sudden change in walking goes against a neurodegenerative disorders such as Parkinson's disease or PSP. The patient will be set up for blood work today, and she will undergo MRI evaluation of the cervical and lumbar spine. The patient follow-up in 3-4 months. She will be getting physical therapy in the home environment.  Jill Alexanders MD 01/17/2015 8:18 PM  [facility] [address] [place], [place] [address]  Phone [phone] Fax [phone]

## 2015-01-17 NOTE — Patient Instructions (Signed)

## 2015-01-18 DIAGNOSIS — J449 Chronic obstructive pulmonary disease, unspecified: Secondary | ICD-10-CM | POA: Diagnosis not present

## 2015-01-18 DIAGNOSIS — R291 Meningismus: Secondary | ICD-10-CM | POA: Diagnosis not present

## 2015-01-18 DIAGNOSIS — S92902D Unspecified fracture of left foot, subsequent encounter for fracture with routine healing: Secondary | ICD-10-CM | POA: Diagnosis not present

## 2015-01-18 DIAGNOSIS — R2689 Other abnormalities of gait and mobility: Secondary | ICD-10-CM | POA: Diagnosis not present

## 2015-01-18 DIAGNOSIS — M545 Low back pain: Secondary | ICD-10-CM | POA: Diagnosis not present

## 2015-01-18 DIAGNOSIS — R296 Repeated falls: Secondary | ICD-10-CM | POA: Diagnosis not present

## 2015-01-18 DIAGNOSIS — I951 Orthostatic hypotension: Secondary | ICD-10-CM | POA: Diagnosis not present

## 2015-01-18 DIAGNOSIS — Z853 Personal history of malignant neoplasm of breast: Secondary | ICD-10-CM | POA: Diagnosis not present

## 2015-01-18 DIAGNOSIS — M1990 Unspecified osteoarthritis, unspecified site: Secondary | ICD-10-CM | POA: Diagnosis not present

## 2015-01-19 LAB — SEDIMENTATION RATE: Sed Rate: 44 mm/hr — ABNORMAL HIGH (ref 0–40)

## 2015-01-19 LAB — COPPER, SERUM: Copper: 147 ug/dL (ref 72–166)

## 2015-01-19 LAB — RPR: RPR Ser Ql: NONREACTIVE

## 2015-01-19 LAB — VITAMIN B12: Vitamin B-12: >2000 pg/mL — ABNORMAL HIGH (ref 211–946)

## 2015-01-19 LAB — ANA W/REFLEX: Anti Nuclear Antibody(ANA): NEGATIVE

## 2015-01-21 DIAGNOSIS — S92902D Unspecified fracture of left foot, subsequent encounter for fracture with routine healing: Secondary | ICD-10-CM | POA: Diagnosis not present

## 2015-01-21 DIAGNOSIS — M545 Low back pain: Secondary | ICD-10-CM | POA: Diagnosis not present

## 2015-01-21 DIAGNOSIS — R2689 Other abnormalities of gait and mobility: Secondary | ICD-10-CM | POA: Diagnosis not present

## 2015-01-21 DIAGNOSIS — J449 Chronic obstructive pulmonary disease, unspecified: Secondary | ICD-10-CM | POA: Diagnosis not present

## 2015-01-21 DIAGNOSIS — R291 Meningismus: Secondary | ICD-10-CM | POA: Diagnosis not present

## 2015-01-21 DIAGNOSIS — M1990 Unspecified osteoarthritis, unspecified site: Secondary | ICD-10-CM | POA: Diagnosis not present

## 2015-01-21 DIAGNOSIS — Z853 Personal history of malignant neoplasm of breast: Secondary | ICD-10-CM | POA: Diagnosis not present

## 2015-01-21 DIAGNOSIS — R296 Repeated falls: Secondary | ICD-10-CM | POA: Diagnosis not present

## 2015-01-21 DIAGNOSIS — I951 Orthostatic hypotension: Secondary | ICD-10-CM | POA: Diagnosis not present

## 2015-01-22 DIAGNOSIS — Z853 Personal history of malignant neoplasm of breast: Secondary | ICD-10-CM | POA: Diagnosis not present

## 2015-01-22 DIAGNOSIS — J449 Chronic obstructive pulmonary disease, unspecified: Secondary | ICD-10-CM | POA: Diagnosis not present

## 2015-01-22 DIAGNOSIS — R2689 Other abnormalities of gait and mobility: Secondary | ICD-10-CM | POA: Diagnosis not present

## 2015-01-22 DIAGNOSIS — M545 Low back pain: Secondary | ICD-10-CM | POA: Diagnosis not present

## 2015-01-22 DIAGNOSIS — R296 Repeated falls: Secondary | ICD-10-CM | POA: Diagnosis not present

## 2015-01-22 DIAGNOSIS — R291 Meningismus: Secondary | ICD-10-CM | POA: Diagnosis not present

## 2015-01-22 DIAGNOSIS — I951 Orthostatic hypotension: Secondary | ICD-10-CM | POA: Diagnosis not present

## 2015-01-22 DIAGNOSIS — M1990 Unspecified osteoarthritis, unspecified site: Secondary | ICD-10-CM | POA: Diagnosis not present

## 2015-01-22 DIAGNOSIS — S92902D Unspecified fracture of left foot, subsequent encounter for fracture with routine healing: Secondary | ICD-10-CM | POA: Diagnosis not present

## 2015-01-23 DIAGNOSIS — R296 Repeated falls: Secondary | ICD-10-CM | POA: Diagnosis not present

## 2015-01-23 DIAGNOSIS — M1990 Unspecified osteoarthritis, unspecified site: Secondary | ICD-10-CM | POA: Diagnosis not present

## 2015-01-23 DIAGNOSIS — R291 Meningismus: Secondary | ICD-10-CM | POA: Diagnosis not present

## 2015-01-23 DIAGNOSIS — R2689 Other abnormalities of gait and mobility: Secondary | ICD-10-CM | POA: Diagnosis not present

## 2015-01-23 DIAGNOSIS — M545 Low back pain: Secondary | ICD-10-CM | POA: Diagnosis not present

## 2015-01-23 DIAGNOSIS — J449 Chronic obstructive pulmonary disease, unspecified: Secondary | ICD-10-CM | POA: Diagnosis not present

## 2015-01-23 DIAGNOSIS — I951 Orthostatic hypotension: Secondary | ICD-10-CM | POA: Diagnosis not present

## 2015-01-23 DIAGNOSIS — Z853 Personal history of malignant neoplasm of breast: Secondary | ICD-10-CM | POA: Diagnosis not present

## 2015-01-23 DIAGNOSIS — S92902D Unspecified fracture of left foot, subsequent encounter for fracture with routine healing: Secondary | ICD-10-CM | POA: Diagnosis not present

## 2015-01-24 DIAGNOSIS — R296 Repeated falls: Secondary | ICD-10-CM | POA: Diagnosis not present

## 2015-01-24 DIAGNOSIS — R291 Meningismus: Secondary | ICD-10-CM | POA: Diagnosis not present

## 2015-01-24 DIAGNOSIS — M545 Low back pain: Secondary | ICD-10-CM | POA: Diagnosis not present

## 2015-01-24 DIAGNOSIS — M1990 Unspecified osteoarthritis, unspecified site: Secondary | ICD-10-CM | POA: Diagnosis not present

## 2015-01-24 DIAGNOSIS — R2689 Other abnormalities of gait and mobility: Secondary | ICD-10-CM | POA: Diagnosis not present

## 2015-01-24 DIAGNOSIS — Z853 Personal history of malignant neoplasm of breast: Secondary | ICD-10-CM | POA: Diagnosis not present

## 2015-01-24 DIAGNOSIS — J449 Chronic obstructive pulmonary disease, unspecified: Secondary | ICD-10-CM | POA: Diagnosis not present

## 2015-01-24 DIAGNOSIS — S92902D Unspecified fracture of left foot, subsequent encounter for fracture with routine healing: Secondary | ICD-10-CM | POA: Diagnosis not present

## 2015-01-24 DIAGNOSIS — I951 Orthostatic hypotension: Secondary | ICD-10-CM | POA: Diagnosis not present

## 2015-01-25 DIAGNOSIS — I951 Orthostatic hypotension: Secondary | ICD-10-CM | POA: Diagnosis not present

## 2015-01-25 DIAGNOSIS — Z853 Personal history of malignant neoplasm of breast: Secondary | ICD-10-CM | POA: Diagnosis not present

## 2015-01-25 DIAGNOSIS — R296 Repeated falls: Secondary | ICD-10-CM | POA: Diagnosis not present

## 2015-01-25 DIAGNOSIS — R51 Headache: Secondary | ICD-10-CM | POA: Diagnosis not present

## 2015-01-25 DIAGNOSIS — E538 Deficiency of other specified B group vitamins: Secondary | ICD-10-CM | POA: Diagnosis not present

## 2015-01-25 DIAGNOSIS — M6289 Other specified disorders of muscle: Secondary | ICD-10-CM | POA: Diagnosis not present

## 2015-01-25 DIAGNOSIS — M545 Low back pain: Secondary | ICD-10-CM | POA: Diagnosis not present

## 2015-01-25 DIAGNOSIS — M1990 Unspecified osteoarthritis, unspecified site: Secondary | ICD-10-CM | POA: Diagnosis not present

## 2015-01-25 DIAGNOSIS — J449 Chronic obstructive pulmonary disease, unspecified: Secondary | ICD-10-CM | POA: Diagnosis not present

## 2015-01-25 DIAGNOSIS — R291 Meningismus: Secondary | ICD-10-CM | POA: Diagnosis not present

## 2015-01-25 DIAGNOSIS — S92902D Unspecified fracture of left foot, subsequent encounter for fracture with routine healing: Secondary | ICD-10-CM | POA: Diagnosis not present

## 2015-01-25 DIAGNOSIS — R269 Unspecified abnormalities of gait and mobility: Secondary | ICD-10-CM | POA: Diagnosis not present

## 2015-01-25 DIAGNOSIS — R2689 Other abnormalities of gait and mobility: Secondary | ICD-10-CM | POA: Diagnosis not present

## 2015-01-28 DIAGNOSIS — R296 Repeated falls: Secondary | ICD-10-CM | POA: Diagnosis not present

## 2015-01-28 DIAGNOSIS — M1990 Unspecified osteoarthritis, unspecified site: Secondary | ICD-10-CM | POA: Diagnosis not present

## 2015-01-28 DIAGNOSIS — J449 Chronic obstructive pulmonary disease, unspecified: Secondary | ICD-10-CM | POA: Diagnosis not present

## 2015-01-28 DIAGNOSIS — I951 Orthostatic hypotension: Secondary | ICD-10-CM | POA: Diagnosis not present

## 2015-01-28 DIAGNOSIS — R2689 Other abnormalities of gait and mobility: Secondary | ICD-10-CM | POA: Diagnosis not present

## 2015-01-28 DIAGNOSIS — M545 Low back pain: Secondary | ICD-10-CM | POA: Diagnosis not present

## 2015-01-28 DIAGNOSIS — Z853 Personal history of malignant neoplasm of breast: Secondary | ICD-10-CM | POA: Diagnosis not present

## 2015-01-28 DIAGNOSIS — S92902D Unspecified fracture of left foot, subsequent encounter for fracture with routine healing: Secondary | ICD-10-CM | POA: Diagnosis not present

## 2015-01-28 DIAGNOSIS — R291 Meningismus: Secondary | ICD-10-CM | POA: Diagnosis not present

## 2015-01-29 DIAGNOSIS — R2689 Other abnormalities of gait and mobility: Secondary | ICD-10-CM | POA: Diagnosis not present

## 2015-01-29 DIAGNOSIS — M1990 Unspecified osteoarthritis, unspecified site: Secondary | ICD-10-CM | POA: Diagnosis not present

## 2015-01-29 DIAGNOSIS — J449 Chronic obstructive pulmonary disease, unspecified: Secondary | ICD-10-CM | POA: Diagnosis not present

## 2015-01-29 DIAGNOSIS — I951 Orthostatic hypotension: Secondary | ICD-10-CM | POA: Diagnosis not present

## 2015-01-29 DIAGNOSIS — S92902D Unspecified fracture of left foot, subsequent encounter for fracture with routine healing: Secondary | ICD-10-CM | POA: Diagnosis not present

## 2015-01-29 DIAGNOSIS — Z853 Personal history of malignant neoplasm of breast: Secondary | ICD-10-CM | POA: Diagnosis not present

## 2015-01-29 DIAGNOSIS — M545 Low back pain: Secondary | ICD-10-CM | POA: Diagnosis not present

## 2015-01-29 DIAGNOSIS — R291 Meningismus: Secondary | ICD-10-CM | POA: Diagnosis not present

## 2015-01-29 DIAGNOSIS — R296 Repeated falls: Secondary | ICD-10-CM | POA: Diagnosis not present

## 2015-01-30 DIAGNOSIS — C50919 Malignant neoplasm of unspecified site of unspecified female breast: Secondary | ICD-10-CM | POA: Diagnosis not present

## 2015-01-30 DIAGNOSIS — M1990 Unspecified osteoarthritis, unspecified site: Secondary | ICD-10-CM | POA: Diagnosis not present

## 2015-01-30 DIAGNOSIS — R291 Meningismus: Secondary | ICD-10-CM | POA: Diagnosis not present

## 2015-01-30 DIAGNOSIS — I10 Essential (primary) hypertension: Secondary | ICD-10-CM | POA: Diagnosis not present

## 2015-01-30 DIAGNOSIS — Z853 Personal history of malignant neoplasm of breast: Secondary | ICD-10-CM | POA: Diagnosis not present

## 2015-01-30 DIAGNOSIS — S92902D Unspecified fracture of left foot, subsequent encounter for fracture with routine healing: Secondary | ICD-10-CM | POA: Diagnosis not present

## 2015-01-30 DIAGNOSIS — J449 Chronic obstructive pulmonary disease, unspecified: Secondary | ICD-10-CM | POA: Diagnosis not present

## 2015-01-30 DIAGNOSIS — M545 Low back pain: Secondary | ICD-10-CM | POA: Diagnosis not present

## 2015-01-30 DIAGNOSIS — R2689 Other abnormalities of gait and mobility: Secondary | ICD-10-CM | POA: Diagnosis not present

## 2015-01-30 DIAGNOSIS — R296 Repeated falls: Secondary | ICD-10-CM | POA: Diagnosis not present

## 2015-01-30 DIAGNOSIS — I951 Orthostatic hypotension: Secondary | ICD-10-CM | POA: Diagnosis not present

## 2015-01-31 DIAGNOSIS — J449 Chronic obstructive pulmonary disease, unspecified: Secondary | ICD-10-CM | POA: Diagnosis not present

## 2015-01-31 DIAGNOSIS — R296 Repeated falls: Secondary | ICD-10-CM | POA: Diagnosis not present

## 2015-01-31 DIAGNOSIS — R2689 Other abnormalities of gait and mobility: Secondary | ICD-10-CM | POA: Diagnosis not present

## 2015-01-31 DIAGNOSIS — M1990 Unspecified osteoarthritis, unspecified site: Secondary | ICD-10-CM | POA: Diagnosis not present

## 2015-01-31 DIAGNOSIS — R291 Meningismus: Secondary | ICD-10-CM | POA: Diagnosis not present

## 2015-01-31 DIAGNOSIS — S92902D Unspecified fracture of left foot, subsequent encounter for fracture with routine healing: Secondary | ICD-10-CM | POA: Diagnosis not present

## 2015-01-31 DIAGNOSIS — I951 Orthostatic hypotension: Secondary | ICD-10-CM | POA: Diagnosis not present

## 2015-01-31 DIAGNOSIS — M545 Low back pain: Secondary | ICD-10-CM | POA: Diagnosis not present

## 2015-01-31 DIAGNOSIS — Z853 Personal history of malignant neoplasm of breast: Secondary | ICD-10-CM | POA: Diagnosis not present

## 2015-02-01 DIAGNOSIS — M545 Low back pain: Secondary | ICD-10-CM | POA: Diagnosis not present

## 2015-02-01 DIAGNOSIS — M1990 Unspecified osteoarthritis, unspecified site: Secondary | ICD-10-CM | POA: Diagnosis not present

## 2015-02-01 DIAGNOSIS — J449 Chronic obstructive pulmonary disease, unspecified: Secondary | ICD-10-CM | POA: Diagnosis not present

## 2015-02-01 DIAGNOSIS — R291 Meningismus: Secondary | ICD-10-CM | POA: Diagnosis not present

## 2015-02-01 DIAGNOSIS — I951 Orthostatic hypotension: Secondary | ICD-10-CM | POA: Diagnosis not present

## 2015-02-01 DIAGNOSIS — Z853 Personal history of malignant neoplasm of breast: Secondary | ICD-10-CM | POA: Diagnosis not present

## 2015-02-01 DIAGNOSIS — R296 Repeated falls: Secondary | ICD-10-CM | POA: Diagnosis not present

## 2015-02-01 DIAGNOSIS — S92902D Unspecified fracture of left foot, subsequent encounter for fracture with routine healing: Secondary | ICD-10-CM | POA: Diagnosis not present

## 2015-02-01 DIAGNOSIS — R2689 Other abnormalities of gait and mobility: Secondary | ICD-10-CM | POA: Diagnosis not present

## 2015-02-04 ENCOUNTER — Other Ambulatory Visit (HOSPITAL_COMMUNITY): Payer: Self-pay | Admitting: Pulmonary Disease

## 2015-02-04 ENCOUNTER — Ambulatory Visit (HOSPITAL_COMMUNITY)
Admission: RE | Admit: 2015-02-04 | Discharge: 2015-02-04 | Disposition: A | Discharge status: Home / Self Care | Payer: Commercial Managed Care - HMO | Source: Ambulatory Visit | Attending: Pulmonary Disease | Admitting: Pulmonary Disease

## 2015-02-04 DIAGNOSIS — S92902D Unspecified fracture of left foot, subsequent encounter for fracture with routine healing: Secondary | ICD-10-CM | POA: Diagnosis not present

## 2015-02-04 DIAGNOSIS — M545 Low back pain: Secondary | ICD-10-CM | POA: Diagnosis not present

## 2015-02-04 DIAGNOSIS — Z9181 History of falling: Secondary | ICD-10-CM | POA: Diagnosis not present

## 2015-02-04 DIAGNOSIS — I951 Orthostatic hypotension: Secondary | ICD-10-CM | POA: Diagnosis not present

## 2015-02-04 DIAGNOSIS — M1990 Unspecified osteoarthritis, unspecified site: Secondary | ICD-10-CM | POA: Diagnosis not present

## 2015-02-04 DIAGNOSIS — R0781 Pleurodynia: Secondary | ICD-10-CM

## 2015-02-04 DIAGNOSIS — M549 Dorsalgia, unspecified: Secondary | ICD-10-CM

## 2015-02-04 DIAGNOSIS — R2689 Other abnormalities of gait and mobility: Secondary | ICD-10-CM | POA: Diagnosis not present

## 2015-02-04 DIAGNOSIS — R296 Repeated falls: Secondary | ICD-10-CM | POA: Diagnosis not present

## 2015-02-04 DIAGNOSIS — R291 Meningismus: Secondary | ICD-10-CM | POA: Diagnosis not present

## 2015-02-04 DIAGNOSIS — Z853 Personal history of malignant neoplasm of breast: Secondary | ICD-10-CM | POA: Diagnosis not present

## 2015-02-04 DIAGNOSIS — J449 Chronic obstructive pulmonary disease, unspecified: Secondary | ICD-10-CM | POA: Diagnosis not present

## 2015-02-05 ENCOUNTER — Other Ambulatory Visit: Payer: Self-pay | Admitting: *Deleted

## 2015-02-05 DIAGNOSIS — R296 Repeated falls: Secondary | ICD-10-CM | POA: Diagnosis not present

## 2015-02-05 DIAGNOSIS — R2689 Other abnormalities of gait and mobility: Secondary | ICD-10-CM | POA: Diagnosis not present

## 2015-02-05 DIAGNOSIS — R291 Meningismus: Secondary | ICD-10-CM | POA: Diagnosis not present

## 2015-02-05 DIAGNOSIS — J449 Chronic obstructive pulmonary disease, unspecified: Secondary | ICD-10-CM | POA: Diagnosis not present

## 2015-02-05 DIAGNOSIS — M1990 Unspecified osteoarthritis, unspecified site: Secondary | ICD-10-CM | POA: Diagnosis not present

## 2015-02-05 DIAGNOSIS — S92902D Unspecified fracture of left foot, subsequent encounter for fracture with routine healing: Secondary | ICD-10-CM | POA: Diagnosis not present

## 2015-02-05 DIAGNOSIS — M545 Low back pain: Secondary | ICD-10-CM | POA: Diagnosis not present

## 2015-02-05 DIAGNOSIS — Z853 Personal history of malignant neoplasm of breast: Secondary | ICD-10-CM | POA: Diagnosis not present

## 2015-02-05 DIAGNOSIS — I951 Orthostatic hypotension: Secondary | ICD-10-CM | POA: Diagnosis not present

## 2015-02-05 NOTE — Patient Outreach (Signed)
[facility]) Care Management  02/05/2015  Kristie Morris December 25, 1924 [id number]   [place] referral:  Telephone call to patient who was advised of reason for call and of [facility] care management services.  Patient states that her current major concern is finding out if her back problem is treatable. States she is seeing her neurologist and will have MRI next week. States plain  X-rays do not show any breaks in her back.   States is aware that she is a falls risk and has taken measures to try to prevent falls. States currently she has family members staying with her that assist her with all of her activities of daily living. States she is unable to walk without walker and family member assisting. States [place] is seeing her several times weekly with RN, physical therapy and occupational therapy services. States she has wheelchair and transporter for use if needed.   Family members are transporting her to doctors' appointments.  No problems obtaining medications and taking as prescribed by her doctor.   Patient states that she does not plan to go to skilled nursing facility.  Declining any community resource information on assisted living currenly or assistance from Education officer, museum.   Patient was offered [facility] care management services telephonically /community for disease management and case management. Patient currently is declining any [facility] services currently. States she feels everything is currently in place and she feels she understands her current medical conditions and is accessing appropriate specialists. .   You would like to get MRI done and get doctor's recommendations before she makes any decisions regarding [facility] services. Patient agrees to follow up call in several weeks. Patient agrees appointment set up for call.       Sherrin Daisy, RN BSN [place] Management Coordinator [facility] Care Management  [phone]

## 2015-02-06 DIAGNOSIS — R296 Repeated falls: Secondary | ICD-10-CM | POA: Diagnosis not present

## 2015-02-06 DIAGNOSIS — I951 Orthostatic hypotension: Secondary | ICD-10-CM | POA: Diagnosis not present

## 2015-02-06 DIAGNOSIS — M1990 Unspecified osteoarthritis, unspecified site: Secondary | ICD-10-CM | POA: Diagnosis not present

## 2015-02-06 DIAGNOSIS — Z853 Personal history of malignant neoplasm of breast: Secondary | ICD-10-CM | POA: Diagnosis not present

## 2015-02-06 DIAGNOSIS — S92902D Unspecified fracture of left foot, subsequent encounter for fracture with routine healing: Secondary | ICD-10-CM | POA: Diagnosis not present

## 2015-02-06 DIAGNOSIS — R2689 Other abnormalities of gait and mobility: Secondary | ICD-10-CM | POA: Diagnosis not present

## 2015-02-06 DIAGNOSIS — M545 Low back pain: Secondary | ICD-10-CM | POA: Diagnosis not present

## 2015-02-06 DIAGNOSIS — J449 Chronic obstructive pulmonary disease, unspecified: Secondary | ICD-10-CM | POA: Diagnosis not present

## 2015-02-06 DIAGNOSIS — R291 Meningismus: Secondary | ICD-10-CM | POA: Diagnosis not present

## 2015-02-07 DIAGNOSIS — R291 Meningismus: Secondary | ICD-10-CM | POA: Diagnosis not present

## 2015-02-07 DIAGNOSIS — M545 Low back pain: Secondary | ICD-10-CM | POA: Diagnosis not present

## 2015-02-07 DIAGNOSIS — R2689 Other abnormalities of gait and mobility: Secondary | ICD-10-CM | POA: Diagnosis not present

## 2015-02-07 DIAGNOSIS — R296 Repeated falls: Secondary | ICD-10-CM | POA: Diagnosis not present

## 2015-02-07 DIAGNOSIS — I951 Orthostatic hypotension: Secondary | ICD-10-CM | POA: Diagnosis not present

## 2015-02-07 DIAGNOSIS — Z853 Personal history of malignant neoplasm of breast: Secondary | ICD-10-CM | POA: Diagnosis not present

## 2015-02-07 DIAGNOSIS — M1990 Unspecified osteoarthritis, unspecified site: Secondary | ICD-10-CM | POA: Diagnosis not present

## 2015-02-07 DIAGNOSIS — J449 Chronic obstructive pulmonary disease, unspecified: Secondary | ICD-10-CM | POA: Diagnosis not present

## 2015-02-07 DIAGNOSIS — S92902D Unspecified fracture of left foot, subsequent encounter for fracture with routine healing: Secondary | ICD-10-CM | POA: Diagnosis not present

## 2015-02-08 DIAGNOSIS — H3532 Exudative age-related macular degeneration: Secondary | ICD-10-CM | POA: Diagnosis not present

## 2015-02-08 DIAGNOSIS — R296 Repeated falls: Secondary | ICD-10-CM | POA: Diagnosis not present

## 2015-02-08 DIAGNOSIS — M545 Low back pain: Secondary | ICD-10-CM | POA: Diagnosis not present

## 2015-02-08 DIAGNOSIS — M1990 Unspecified osteoarthritis, unspecified site: Secondary | ICD-10-CM | POA: Diagnosis not present

## 2015-02-08 DIAGNOSIS — I951 Orthostatic hypotension: Secondary | ICD-10-CM | POA: Diagnosis not present

## 2015-02-08 DIAGNOSIS — S92902D Unspecified fracture of left foot, subsequent encounter for fracture with routine healing: Secondary | ICD-10-CM | POA: Diagnosis not present

## 2015-02-08 DIAGNOSIS — H43813 Vitreous degeneration, bilateral: Secondary | ICD-10-CM | POA: Diagnosis not present

## 2015-02-08 DIAGNOSIS — R2689 Other abnormalities of gait and mobility: Secondary | ICD-10-CM | POA: Diagnosis not present

## 2015-02-08 DIAGNOSIS — J449 Chronic obstructive pulmonary disease, unspecified: Secondary | ICD-10-CM | POA: Diagnosis not present

## 2015-02-08 DIAGNOSIS — Z853 Personal history of malignant neoplasm of breast: Secondary | ICD-10-CM | POA: Diagnosis not present

## 2015-02-08 DIAGNOSIS — R291 Meningismus: Secondary | ICD-10-CM | POA: Diagnosis not present

## 2015-02-10 DIAGNOSIS — M545 Low back pain: Secondary | ICD-10-CM | POA: Diagnosis not present

## 2015-02-10 DIAGNOSIS — R291 Meningismus: Secondary | ICD-10-CM | POA: Diagnosis not present

## 2015-02-10 DIAGNOSIS — I951 Orthostatic hypotension: Secondary | ICD-10-CM | POA: Diagnosis not present

## 2015-02-10 DIAGNOSIS — R296 Repeated falls: Secondary | ICD-10-CM | POA: Diagnosis not present

## 2015-02-10 DIAGNOSIS — M1990 Unspecified osteoarthritis, unspecified site: Secondary | ICD-10-CM | POA: Diagnosis not present

## 2015-02-10 DIAGNOSIS — R2689 Other abnormalities of gait and mobility: Secondary | ICD-10-CM | POA: Diagnosis not present

## 2015-02-10 DIAGNOSIS — S92902D Unspecified fracture of left foot, subsequent encounter for fracture with routine healing: Secondary | ICD-10-CM | POA: Diagnosis not present

## 2015-02-10 DIAGNOSIS — Z853 Personal history of malignant neoplasm of breast: Secondary | ICD-10-CM | POA: Diagnosis not present

## 2015-02-10 DIAGNOSIS — J449 Chronic obstructive pulmonary disease, unspecified: Secondary | ICD-10-CM | POA: Diagnosis not present

## 2015-02-11 ENCOUNTER — Ambulatory Visit
Admission: RE | Admit: 2015-02-11 | Discharge: 2015-02-11 | Disposition: A | Discharge status: Home / Self Care | Payer: Commercial Managed Care - HMO | Source: Ambulatory Visit | Attending: Neurology | Admitting: Neurology

## 2015-02-11 ENCOUNTER — Telehealth: Payer: Self-pay | Admitting: Neurology

## 2015-02-11 DIAGNOSIS — R269 Unspecified abnormalities of gait and mobility: Secondary | ICD-10-CM

## 2015-02-11 DIAGNOSIS — R531 Weakness: Secondary | ICD-10-CM

## 2015-02-11 DIAGNOSIS — M6289 Other specified disorders of muscle: Secondary | ICD-10-CM | POA: Diagnosis not present

## 2015-02-11 DIAGNOSIS — R519 Headache, unspecified: Secondary | ICD-10-CM

## 2015-02-11 DIAGNOSIS — R51 Headache: Secondary | ICD-10-CM

## 2015-02-11 DIAGNOSIS — M5442 Lumbago with sciatica, left side: Secondary | ICD-10-CM

## 2015-02-11 DIAGNOSIS — M269 Dentofacial anomaly, unspecified: Secondary | ICD-10-CM | POA: Diagnosis not present

## 2015-02-11 NOTE — Telephone Encounter (Signed)
I called patient. She continues to have ongoing balance issues, she fell yesterday. She is getting physical therapy. MRI of the low back shows moderate level spinal stenosis at L2-3, L3-4, and L4-5 levels. There may be compression of the left greater than right L5 nerve roots. The patient does have back pain and pain down the left leg. She has some spinal stenosis that is not critical in the cervical spine. I will set the patient up for an epidural steroid injection, she will continue physical therapy. The patient is also reporting some urinary incontinence, she will be seen a urologist for this.  MRI lumbar spine 02/11/2015:  IMPRESSION: This is an abnormal MRI of the lumbar spine showing multilevel degenerative changes as detailed above. Most significant findings are: 1.. Mild to moderate spinal stenosis at L2-L3. There does not appear to be any nerve root compression at this level. However, there is some encroachment upon the exiting right L2 nerve root. 2. Moderately severe spinal stenosis at L3-L4. There is no definite nerve root compression at this level but there is some encroachment upon bilateral exiting L3 and traversing L4 nerve roots.  3. Moderate spinal stenosis at L4-L5. There is possible left L5 nerve root compression at this level and there is also some encroachment upon the right L5 and left L4 nerve roots. 4. There is milder degenerative changes at L1-L2 and L5-S1 with less potential for nerve root impingement   MRI cervical 02/11/2015:  IMPRESSION: This is an abnormal MRI of the cervical spine showing the following: 1. Spinal stenosis at C4-C5. There is moderately severe right and moderate left foraminal narrowing. This could lead to right C5 nerve root compression and there is also some encroachment upon the left C5 nerve root. 2. 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 The neural foramina are widely patent at this level. 3. Mild  spinal stenosis at C6-C7. There is no nerve root compression or significant nerve root encroachment at this level.

## 2015-02-12 DIAGNOSIS — M545 Low back pain: Secondary | ICD-10-CM | POA: Diagnosis not present

## 2015-02-12 DIAGNOSIS — M1990 Unspecified osteoarthritis, unspecified site: Secondary | ICD-10-CM | POA: Diagnosis not present

## 2015-02-12 DIAGNOSIS — J449 Chronic obstructive pulmonary disease, unspecified: Secondary | ICD-10-CM | POA: Diagnosis not present

## 2015-02-12 DIAGNOSIS — Z853 Personal history of malignant neoplasm of breast: Secondary | ICD-10-CM | POA: Diagnosis not present

## 2015-02-12 DIAGNOSIS — R2689 Other abnormalities of gait and mobility: Secondary | ICD-10-CM | POA: Diagnosis not present

## 2015-02-12 DIAGNOSIS — R296 Repeated falls: Secondary | ICD-10-CM | POA: Diagnosis not present

## 2015-02-12 DIAGNOSIS — S92902D Unspecified fracture of left foot, subsequent encounter for fracture with routine healing: Secondary | ICD-10-CM | POA: Diagnosis not present

## 2015-02-12 DIAGNOSIS — R291 Meningismus: Secondary | ICD-10-CM | POA: Diagnosis not present

## 2015-02-12 DIAGNOSIS — I951 Orthostatic hypotension: Secondary | ICD-10-CM | POA: Diagnosis not present

## 2015-02-14 DIAGNOSIS — S92902D Unspecified fracture of left foot, subsequent encounter for fracture with routine healing: Secondary | ICD-10-CM | POA: Diagnosis not present

## 2015-02-14 DIAGNOSIS — J449 Chronic obstructive pulmonary disease, unspecified: Secondary | ICD-10-CM | POA: Diagnosis not present

## 2015-02-14 DIAGNOSIS — R296 Repeated falls: Secondary | ICD-10-CM | POA: Diagnosis not present

## 2015-02-14 DIAGNOSIS — R2689 Other abnormalities of gait and mobility: Secondary | ICD-10-CM | POA: Diagnosis not present

## 2015-02-14 DIAGNOSIS — I951 Orthostatic hypotension: Secondary | ICD-10-CM | POA: Diagnosis not present

## 2015-02-14 DIAGNOSIS — M545 Low back pain: Secondary | ICD-10-CM | POA: Diagnosis not present

## 2015-02-14 DIAGNOSIS — M1990 Unspecified osteoarthritis, unspecified site: Secondary | ICD-10-CM | POA: Diagnosis not present

## 2015-02-14 DIAGNOSIS — R291 Meningismus: Secondary | ICD-10-CM | POA: Diagnosis not present

## 2015-02-14 DIAGNOSIS — Z853 Personal history of malignant neoplasm of breast: Secondary | ICD-10-CM | POA: Diagnosis not present

## 2015-02-18 DIAGNOSIS — R291 Meningismus: Secondary | ICD-10-CM | POA: Diagnosis not present

## 2015-02-18 DIAGNOSIS — Z853 Personal history of malignant neoplasm of breast: Secondary | ICD-10-CM | POA: Diagnosis not present

## 2015-02-18 DIAGNOSIS — R2689 Other abnormalities of gait and mobility: Secondary | ICD-10-CM | POA: Diagnosis not present

## 2015-02-18 DIAGNOSIS — I951 Orthostatic hypotension: Secondary | ICD-10-CM | POA: Diagnosis not present

## 2015-02-18 DIAGNOSIS — J449 Chronic obstructive pulmonary disease, unspecified: Secondary | ICD-10-CM | POA: Diagnosis not present

## 2015-02-18 DIAGNOSIS — M1990 Unspecified osteoarthritis, unspecified site: Secondary | ICD-10-CM | POA: Diagnosis not present

## 2015-02-18 DIAGNOSIS — R296 Repeated falls: Secondary | ICD-10-CM | POA: Diagnosis not present

## 2015-02-18 DIAGNOSIS — M545 Low back pain: Secondary | ICD-10-CM | POA: Diagnosis not present

## 2015-02-18 DIAGNOSIS — S92902D Unspecified fracture of left foot, subsequent encounter for fracture with routine healing: Secondary | ICD-10-CM | POA: Diagnosis not present

## 2015-02-19 DIAGNOSIS — R2689 Other abnormalities of gait and mobility: Secondary | ICD-10-CM | POA: Diagnosis not present

## 2015-02-19 DIAGNOSIS — R291 Meningismus: Secondary | ICD-10-CM | POA: Diagnosis not present

## 2015-02-19 DIAGNOSIS — Z853 Personal history of malignant neoplasm of breast: Secondary | ICD-10-CM | POA: Diagnosis not present

## 2015-02-19 DIAGNOSIS — M1990 Unspecified osteoarthritis, unspecified site: Secondary | ICD-10-CM | POA: Diagnosis not present

## 2015-02-19 DIAGNOSIS — S92902D Unspecified fracture of left foot, subsequent encounter for fracture with routine healing: Secondary | ICD-10-CM | POA: Diagnosis not present

## 2015-02-19 DIAGNOSIS — J449 Chronic obstructive pulmonary disease, unspecified: Secondary | ICD-10-CM | POA: Diagnosis not present

## 2015-02-19 DIAGNOSIS — I951 Orthostatic hypotension: Secondary | ICD-10-CM | POA: Diagnosis not present

## 2015-02-19 DIAGNOSIS — R296 Repeated falls: Secondary | ICD-10-CM | POA: Diagnosis not present

## 2015-02-19 DIAGNOSIS — M545 Low back pain: Secondary | ICD-10-CM | POA: Diagnosis not present

## 2015-02-20 DIAGNOSIS — C50919 Malignant neoplasm of unspecified site of unspecified female breast: Secondary | ICD-10-CM | POA: Diagnosis not present

## 2015-02-20 DIAGNOSIS — S92902D Unspecified fracture of left foot, subsequent encounter for fracture with routine healing: Secondary | ICD-10-CM | POA: Diagnosis not present

## 2015-02-20 DIAGNOSIS — R291 Meningismus: Secondary | ICD-10-CM | POA: Diagnosis not present

## 2015-02-20 DIAGNOSIS — Z853 Personal history of malignant neoplasm of breast: Secondary | ICD-10-CM | POA: Diagnosis not present

## 2015-02-20 DIAGNOSIS — I951 Orthostatic hypotension: Secondary | ICD-10-CM | POA: Diagnosis not present

## 2015-02-20 DIAGNOSIS — M545 Low back pain: Secondary | ICD-10-CM | POA: Diagnosis not present

## 2015-02-20 DIAGNOSIS — J449 Chronic obstructive pulmonary disease, unspecified: Secondary | ICD-10-CM | POA: Diagnosis not present

## 2015-02-20 DIAGNOSIS — R296 Repeated falls: Secondary | ICD-10-CM | POA: Diagnosis not present

## 2015-02-20 DIAGNOSIS — I10 Essential (primary) hypertension: Secondary | ICD-10-CM | POA: Diagnosis not present

## 2015-02-20 DIAGNOSIS — I6350 Cerebral infarction due to unspecified occlusion or stenosis of unspecified cerebral artery: Secondary | ICD-10-CM | POA: Diagnosis not present

## 2015-02-20 DIAGNOSIS — M1990 Unspecified osteoarthritis, unspecified site: Secondary | ICD-10-CM | POA: Diagnosis not present

## 2015-02-20 DIAGNOSIS — R2689 Other abnormalities of gait and mobility: Secondary | ICD-10-CM | POA: Diagnosis not present

## 2015-02-21 DIAGNOSIS — Z853 Personal history of malignant neoplasm of breast: Secondary | ICD-10-CM | POA: Diagnosis not present

## 2015-02-21 DIAGNOSIS — M545 Low back pain: Secondary | ICD-10-CM | POA: Diagnosis not present

## 2015-02-21 DIAGNOSIS — S92902D Unspecified fracture of left foot, subsequent encounter for fracture with routine healing: Secondary | ICD-10-CM | POA: Diagnosis not present

## 2015-02-21 DIAGNOSIS — R291 Meningismus: Secondary | ICD-10-CM | POA: Diagnosis not present

## 2015-02-21 DIAGNOSIS — I951 Orthostatic hypotension: Secondary | ICD-10-CM | POA: Diagnosis not present

## 2015-02-21 DIAGNOSIS — R2689 Other abnormalities of gait and mobility: Secondary | ICD-10-CM | POA: Diagnosis not present

## 2015-02-21 DIAGNOSIS — J449 Chronic obstructive pulmonary disease, unspecified: Secondary | ICD-10-CM | POA: Diagnosis not present

## 2015-02-21 DIAGNOSIS — M1990 Unspecified osteoarthritis, unspecified site: Secondary | ICD-10-CM | POA: Diagnosis not present

## 2015-02-21 DIAGNOSIS — R296 Repeated falls: Secondary | ICD-10-CM | POA: Diagnosis not present

## 2015-02-22 DIAGNOSIS — Z853 Personal history of malignant neoplasm of breast: Secondary | ICD-10-CM | POA: Diagnosis not present

## 2015-02-22 DIAGNOSIS — M1990 Unspecified osteoarthritis, unspecified site: Secondary | ICD-10-CM | POA: Diagnosis not present

## 2015-02-22 DIAGNOSIS — S92902D Unspecified fracture of left foot, subsequent encounter for fracture with routine healing: Secondary | ICD-10-CM | POA: Diagnosis not present

## 2015-02-22 DIAGNOSIS — M545 Low back pain: Secondary | ICD-10-CM | POA: Diagnosis not present

## 2015-02-22 DIAGNOSIS — I951 Orthostatic hypotension: Secondary | ICD-10-CM | POA: Diagnosis not present

## 2015-02-22 DIAGNOSIS — R291 Meningismus: Secondary | ICD-10-CM | POA: Diagnosis not present

## 2015-02-22 DIAGNOSIS — R2689 Other abnormalities of gait and mobility: Secondary | ICD-10-CM | POA: Diagnosis not present

## 2015-02-22 DIAGNOSIS — J449 Chronic obstructive pulmonary disease, unspecified: Secondary | ICD-10-CM | POA: Diagnosis not present

## 2015-02-22 DIAGNOSIS — R296 Repeated falls: Secondary | ICD-10-CM | POA: Diagnosis not present

## 2015-02-24 DIAGNOSIS — M1990 Unspecified osteoarthritis, unspecified site: Secondary | ICD-10-CM | POA: Diagnosis not present

## 2015-02-24 DIAGNOSIS — R291 Meningismus: Secondary | ICD-10-CM | POA: Diagnosis not present

## 2015-02-24 DIAGNOSIS — R2689 Other abnormalities of gait and mobility: Secondary | ICD-10-CM | POA: Diagnosis not present

## 2015-02-24 DIAGNOSIS — R296 Repeated falls: Secondary | ICD-10-CM | POA: Diagnosis not present

## 2015-02-24 DIAGNOSIS — M545 Low back pain: Secondary | ICD-10-CM | POA: Diagnosis not present

## 2015-02-24 DIAGNOSIS — Z853 Personal history of malignant neoplasm of breast: Secondary | ICD-10-CM | POA: Diagnosis not present

## 2015-02-24 DIAGNOSIS — S92902D Unspecified fracture of left foot, subsequent encounter for fracture with routine healing: Secondary | ICD-10-CM | POA: Diagnosis not present

## 2015-02-24 DIAGNOSIS — J449 Chronic obstructive pulmonary disease, unspecified: Secondary | ICD-10-CM | POA: Diagnosis not present

## 2015-02-24 DIAGNOSIS — I951 Orthostatic hypotension: Secondary | ICD-10-CM | POA: Diagnosis not present

## 2015-02-26 DIAGNOSIS — S92902D Unspecified fracture of left foot, subsequent encounter for fracture with routine healing: Secondary | ICD-10-CM | POA: Diagnosis not present

## 2015-02-26 DIAGNOSIS — M1990 Unspecified osteoarthritis, unspecified site: Secondary | ICD-10-CM | POA: Diagnosis not present

## 2015-02-26 DIAGNOSIS — R2689 Other abnormalities of gait and mobility: Secondary | ICD-10-CM | POA: Diagnosis not present

## 2015-02-26 DIAGNOSIS — M545 Low back pain: Secondary | ICD-10-CM | POA: Diagnosis not present

## 2015-02-26 DIAGNOSIS — R296 Repeated falls: Secondary | ICD-10-CM | POA: Diagnosis not present

## 2015-02-26 DIAGNOSIS — J449 Chronic obstructive pulmonary disease, unspecified: Secondary | ICD-10-CM | POA: Diagnosis not present

## 2015-02-26 DIAGNOSIS — Z853 Personal history of malignant neoplasm of breast: Secondary | ICD-10-CM | POA: Diagnosis not present

## 2015-02-26 DIAGNOSIS — R291 Meningismus: Secondary | ICD-10-CM | POA: Diagnosis not present

## 2015-02-26 DIAGNOSIS — I951 Orthostatic hypotension: Secondary | ICD-10-CM | POA: Diagnosis not present

## 2015-02-28 DIAGNOSIS — Z853 Personal history of malignant neoplasm of breast: Secondary | ICD-10-CM | POA: Diagnosis not present

## 2015-02-28 DIAGNOSIS — M1990 Unspecified osteoarthritis, unspecified site: Secondary | ICD-10-CM | POA: Diagnosis not present

## 2015-02-28 DIAGNOSIS — M545 Low back pain: Secondary | ICD-10-CM | POA: Diagnosis not present

## 2015-02-28 DIAGNOSIS — J449 Chronic obstructive pulmonary disease, unspecified: Secondary | ICD-10-CM | POA: Diagnosis not present

## 2015-02-28 DIAGNOSIS — R2689 Other abnormalities of gait and mobility: Secondary | ICD-10-CM | POA: Diagnosis not present

## 2015-02-28 DIAGNOSIS — R296 Repeated falls: Secondary | ICD-10-CM | POA: Diagnosis not present

## 2015-02-28 DIAGNOSIS — I951 Orthostatic hypotension: Secondary | ICD-10-CM | POA: Diagnosis not present

## 2015-02-28 DIAGNOSIS — R291 Meningismus: Secondary | ICD-10-CM | POA: Diagnosis not present

## 2015-02-28 DIAGNOSIS — S92902D Unspecified fracture of left foot, subsequent encounter for fracture with routine healing: Secondary | ICD-10-CM | POA: Diagnosis not present

## 2015-03-04 DIAGNOSIS — Z853 Personal history of malignant neoplasm of breast: Secondary | ICD-10-CM | POA: Diagnosis not present

## 2015-03-04 DIAGNOSIS — R291 Meningismus: Secondary | ICD-10-CM | POA: Diagnosis not present

## 2015-03-04 DIAGNOSIS — J449 Chronic obstructive pulmonary disease, unspecified: Secondary | ICD-10-CM | POA: Diagnosis not present

## 2015-03-04 DIAGNOSIS — M545 Low back pain: Secondary | ICD-10-CM | POA: Diagnosis not present

## 2015-03-04 DIAGNOSIS — S92902D Unspecified fracture of left foot, subsequent encounter for fracture with routine healing: Secondary | ICD-10-CM | POA: Diagnosis not present

## 2015-03-04 DIAGNOSIS — M1990 Unspecified osteoarthritis, unspecified site: Secondary | ICD-10-CM | POA: Diagnosis not present

## 2015-03-04 DIAGNOSIS — R296 Repeated falls: Secondary | ICD-10-CM | POA: Diagnosis not present

## 2015-03-04 DIAGNOSIS — R2689 Other abnormalities of gait and mobility: Secondary | ICD-10-CM | POA: Diagnosis not present

## 2015-03-04 DIAGNOSIS — I951 Orthostatic hypotension: Secondary | ICD-10-CM | POA: Diagnosis not present

## 2015-03-05 DIAGNOSIS — M1990 Unspecified osteoarthritis, unspecified site: Secondary | ICD-10-CM | POA: Diagnosis not present

## 2015-03-05 DIAGNOSIS — Z853 Personal history of malignant neoplasm of breast: Secondary | ICD-10-CM | POA: Diagnosis not present

## 2015-03-05 DIAGNOSIS — I951 Orthostatic hypotension: Secondary | ICD-10-CM | POA: Diagnosis not present

## 2015-03-05 DIAGNOSIS — R291 Meningismus: Secondary | ICD-10-CM | POA: Diagnosis not present

## 2015-03-05 DIAGNOSIS — J449 Chronic obstructive pulmonary disease, unspecified: Secondary | ICD-10-CM | POA: Diagnosis not present

## 2015-03-05 DIAGNOSIS — S92902D Unspecified fracture of left foot, subsequent encounter for fracture with routine healing: Secondary | ICD-10-CM | POA: Diagnosis not present

## 2015-03-05 DIAGNOSIS — R2689 Other abnormalities of gait and mobility: Secondary | ICD-10-CM | POA: Diagnosis not present

## 2015-03-05 DIAGNOSIS — R296 Repeated falls: Secondary | ICD-10-CM | POA: Diagnosis not present

## 2015-03-05 DIAGNOSIS — M545 Low back pain: Secondary | ICD-10-CM | POA: Diagnosis not present

## 2015-03-06 DIAGNOSIS — Z853 Personal history of malignant neoplasm of breast: Secondary | ICD-10-CM | POA: Diagnosis not present

## 2015-03-06 DIAGNOSIS — M545 Low back pain: Secondary | ICD-10-CM | POA: Diagnosis not present

## 2015-03-06 DIAGNOSIS — M1990 Unspecified osteoarthritis, unspecified site: Secondary | ICD-10-CM | POA: Diagnosis not present

## 2015-03-06 DIAGNOSIS — J449 Chronic obstructive pulmonary disease, unspecified: Secondary | ICD-10-CM | POA: Diagnosis not present

## 2015-03-06 DIAGNOSIS — R291 Meningismus: Secondary | ICD-10-CM | POA: Diagnosis not present

## 2015-03-06 DIAGNOSIS — R2689 Other abnormalities of gait and mobility: Secondary | ICD-10-CM | POA: Diagnosis not present

## 2015-03-06 DIAGNOSIS — S92902D Unspecified fracture of left foot, subsequent encounter for fracture with routine healing: Secondary | ICD-10-CM | POA: Diagnosis not present

## 2015-03-06 DIAGNOSIS — I951 Orthostatic hypotension: Secondary | ICD-10-CM | POA: Diagnosis not present

## 2015-03-06 DIAGNOSIS — R296 Repeated falls: Secondary | ICD-10-CM | POA: Diagnosis not present

## 2015-03-07 ENCOUNTER — Encounter: Payer: Self-pay | Admitting: *Deleted

## 2015-03-07 ENCOUNTER — Other Ambulatory Visit: Payer: Self-pay | Admitting: *Deleted

## 2015-03-07 DIAGNOSIS — R42 Dizziness and giddiness: Secondary | ICD-10-CM | POA: Diagnosis not present

## 2015-03-07 NOTE — Patient Outreach (Signed)
[facility]) Care Management  03/07/2015  RAMATA GENAO [birth year] [id number]   Follow up call to patient. Patient voices that she has had MRI and doctor's appointment and has a better understanding of her back problem. States she will get epidural next week.   States she continues to receive home health services and has strong support from her family. Patient was advised of [facility] care management services. She declined services and thanked me for calling. Patient was given contact information for [facility] including 24 hour nurse advise line.   Plan: will close case and send MD letter.  Sherrin Daisy, RN BSN [place] Management Coordinator [facility] Care Management  [phone]

## 2015-03-11 DIAGNOSIS — J449 Chronic obstructive pulmonary disease, unspecified: Secondary | ICD-10-CM | POA: Diagnosis not present

## 2015-03-11 DIAGNOSIS — I951 Orthostatic hypotension: Secondary | ICD-10-CM | POA: Diagnosis not present

## 2015-03-11 DIAGNOSIS — R291 Meningismus: Secondary | ICD-10-CM | POA: Diagnosis not present

## 2015-03-11 DIAGNOSIS — S92902D Unspecified fracture of left foot, subsequent encounter for fracture with routine healing: Secondary | ICD-10-CM | POA: Diagnosis not present

## 2015-03-11 DIAGNOSIS — M545 Low back pain: Secondary | ICD-10-CM | POA: Diagnosis not present

## 2015-03-11 DIAGNOSIS — M1990 Unspecified osteoarthritis, unspecified site: Secondary | ICD-10-CM | POA: Diagnosis not present

## 2015-03-11 DIAGNOSIS — Z853 Personal history of malignant neoplasm of breast: Secondary | ICD-10-CM | POA: Diagnosis not present

## 2015-03-11 DIAGNOSIS — R296 Repeated falls: Secondary | ICD-10-CM | POA: Diagnosis not present

## 2015-03-11 DIAGNOSIS — R2689 Other abnormalities of gait and mobility: Secondary | ICD-10-CM | POA: Diagnosis not present

## 2015-03-12 ENCOUNTER — Ambulatory Visit (INDEPENDENT_AMBULATORY_CARE_PROVIDER_SITE_OTHER): Payer: Commercial Managed Care - HMO | Admitting: Neurology

## 2015-03-12 ENCOUNTER — Encounter: Payer: Self-pay | Admitting: Neurology

## 2015-03-12 VITALS — BP 162/92 | HR 102 | Ht 63.0 in

## 2015-03-12 DIAGNOSIS — R296 Repeated falls: Secondary | ICD-10-CM

## 2015-03-12 DIAGNOSIS — R51 Headache: Secondary | ICD-10-CM

## 2015-03-12 DIAGNOSIS — R519 Headache, unspecified: Secondary | ICD-10-CM

## 2015-03-12 DIAGNOSIS — R269 Unspecified abnormalities of gait and mobility: Secondary | ICD-10-CM

## 2015-03-12 DIAGNOSIS — M6289 Other specified disorders of muscle: Secondary | ICD-10-CM

## 2015-03-12 DIAGNOSIS — R531 Weakness: Secondary | ICD-10-CM

## 2015-03-12 MED ORDER — TOPIRAMATE 25 MG PO TABS: 25.0000 mg | ORAL_TABLET | Freq: Every day | ORAL | Status: DC

## 2015-03-12 NOTE — Patient Outreach (Signed)
[facility]) Care Management  03/12/2015  Kristie Morris September 13, 1925 [id number]   Notification from Sherrin Daisy, RN to close case as patient refused to participate with [facility] Management services.  Ronnell Freshwater. [place], [place] Management [place] Assistant Phone: [phone] Fax: [phone]

## 2015-03-12 NOTE — Progress Notes (Signed)
Reason for visit: Gait disorder  Kristie Morris is an 81 y.o. female  History of present illness:  Kristie Morris is an 81 year old right-handed white female with a history of a gait disorder. The patient indicates that the walking issue and urinary incontinence issue began suddenly after a fall. The patient has been undergoing physical therapy with some mild improvement. The patient is able to walk short distances independently with a walker at this time, but she still has a very definite tendency to lean backwards. She has fallen on 2 occasions since last seen. She is no longer getting occupational therapy. The patient has a left-sided hemiparesis related to cerebrovascular disease. MRI of the brain done recently does not show a new cerebrovascular event. The patient has undergone MRI evaluation of the cervical spine and lumbar spine, no evidence of spinal cord compression is noted, but the lumbar spine does show spinal stenosis most notable at the L3-4 and L4-5 levels. The patient has low back pain and some discomfort into the left leg since the fall. The patient also has episodes of vertigo or dizziness that may be related to positional vertigo. She also has had some increasing problems with headache over the last month or 2 in the temporal regions and in the base of the cervical spine. A sedimentation rate was 44 in April, minimally elevated. The headaches occur on a daily basis at this time.  Past Medical History  Diagnosis Date  . Hypertension   . Asthma   . Crohn disease   . COPD (chronic obstructive pulmonary disease)   . Malignant hypertension 02/02/2014  . Diastolic dysfunction A999333  . Anemia 02/02/2014    Anemia panel WNL  . CAD (coronary artery disease) 2003    Cath with nonobstructive CAD  . Abnormality of gait 01/08/2015  . Heart murmur   . CHF (congestive heart failure)   . Pneumonia 2014; 2015  . History of blood transfusion     "I had 7; related to packing left inside me when  I had a D&C; got blood poisoning"  . GERD (gastroesophageal reflux disease)   . Daily headache     "this last few months" (01/08/2015)  . Migraine     "when I was a young woman" (01/08/2015)  . Stroke 03/2014    "left leg weak; lost of strength in my left side; slight tremors; some speach problems since" (01/08/2015)  . Arthritis     "left knee" (01/08/2015)  . Fibromyalgia   . Chronic lower back pain   . Anxiety   . Breast cancer     "I've had it in both breasts"  . Macular hole, both eyes     "both wet and dry"  . OAB (overactive bladder)     Past Surgical History  Procedure Laterality Date  . Mastectomy, partial Right 1999  . Back surgery    . Knee arthroscopy Right 2005  . Abdominal hysterectomy  1954  . Ovarian cyst removal  1946  . Cataract extraction w/ intraocular lens implant Right 2008  . Dilation and curettage of uterus  1953    S/P miscarriage  . Hemorrhoid surgery  1958  . Anterior cervical decomp/discectomy fusion  1971    "used bone off of my right hip"  . Abdominal adhesion surgery  1993    from colon  . Breast lumpectomy Left 2006  . Patella fracture surgery Left 2008  . Cataract extraction w/ intraocular lens implant Left 2015  .  Foot fracture surgery Left 12/2014  . Fracture surgery    . Cardiac catheterization  11/2001    Family History  Problem Relation Age of Onset  . Heart attack Mother   . CAD Mother   . Heart attack Sister   . Cancer - Ovarian Sister   . Cancer - Colon Father     Social history:  reports that she has quit smoking. Her smoking use included Cigarettes. She has a 7.5 pack-year smoking history. She has never used smokeless tobacco. She reports that she does not drink alcohol or use illicit drugs.    Allergies  Allergen Reactions  . Zithromax [Azithromycin] Diarrhea    Pt in hosp for 2 weeks   . Celebrex [Celecoxib] Hives  . Ciprofloxacin Hives  . Moxifloxacin   . Penicillins Hives  . Polymyxin B Hives  . Sulfa Antibiotics  Hives  . Vancomycin Hives  . Vioxx [Rofecoxib] Hives  . Metronidazole     Medications:  Prior to Admission medications   Medication Sig Start Date End Date Taking? Authorizing Provider  albuterol (PROVENTIL HFA;VENTOLIN HFA) 108 (90 BASE) MCG/ACT inhaler Inhale 2 puffs into the lungs every 6 (six) hours as needed for wheezing or shortness of breath.   Yes Historical Provider, MD  ALPRAZolam (XANAX) 0.25 MG tablet Take 0.25 mg by mouth 2 (two) times daily as needed for anxiety.   Yes Historical Provider, MD  amLODipine (NORVASC) 5 MG tablet Take 5 mg AM and 5 mg PM Patient taking differently: Take 5 mg by mouth daily. Take 5 mg AM and 5 mg PM 09/14/14  Yes Herminio Commons, MD  aspirin EC 81 MG tablet Take 81 mg by mouth daily.   Yes Historical Provider, MD  Cyanocobalamin 2500 MCG CHEW Chew 1 tablet by mouth daily.   Yes Historical Provider, MD  dicyclomine (BENTYL) 20 MG tablet Take 20 mg by mouth daily.    Yes Historical Provider, MD  esomeprazole (NEXIUM) 20 MG capsule Take 20 mg by mouth daily at 12 noon.   Yes Historical Provider, MD  fexofenadine (ALLEGRA) 180 MG tablet Take 180 mg by mouth daily.   Yes Historical Provider, MD  Fluticasone-Salmeterol (ADVAIR) 250-50 MCG/DOSE AEPB Inhale 1 puff into the lungs 2 (two) times daily.   Yes Historical Provider, MD  ibuprofen (ADVIL,MOTRIN) 200 MG tablet Take 200 mg by mouth every 6 (six) hours as needed.   Yes Historical Provider, MD  Multiple Vitamins-Minerals (PRESERVISION AREDS PO) Take 1 tablet by mouth daily.   Yes Historical Provider, MD  naproxen sodium (ANAPROX) 220 MG tablet Take 220 mg by mouth 2 (two) times daily with a meal.   Yes Historical Provider, MD  Probiotic Product (ALIGN PO) Take 1 capsule by mouth daily.   Yes Historical Provider, MD    ROS:  Out of a complete 14 system review of symptoms, the patient complains only of the following symptoms, and all other reviewed systems are negative.  Hearing loss, ear  pain Leg swelling Back pain, walking difficulty Bruising easily Headache, weakness  Blood pressure 162/92, pulse 102, height 5\' 3"  (1.6 m).  Physical Exam  General: The patient is alert and cooperative at the time of the examination. The patient is moderately obese.  Skin: 1-2+ edema is noted below the knees bilaterally.   Neurologic Exam  Mental status: The patient is alert and oriented x 3 at the time of the examination. The patient has apparent normal recent and remote memory, with an apparently  normal attention span and concentration ability.   Cranial nerves: Facial symmetry is present. Speech is normal, no aphasia or dysarthria is noted. Extraocular movements are full. Visual fields are full.  Motor: The patient has good strength in the right extremities. The patient has 4/5 strength proximally in the left arm and left leg.  Sensory examination: Soft touch sensation is symmetric on the face, arms, and legs.  Coordination: The patient has good finger-nose-finger and heel-to-shin bilaterally.  Gait and station: The patient requires some assistance with standing. Once up, the patient has a definite tendency to lean backwards. The patient is able to ambulate short distances with assistance, she has difficulty advancing her steps with using the left leg. Tandem gait could not be tested. Romberg is positive, the patient falls backwards.  Reflexes: Deep tendon reflexes are symmetric, but are depressed.   MRI lumbar 02/11/15:  IMPRESSION: This is an abnormal MRI of the lumbar spine showing multilevel degenerative changes as detailed above. Most significant findings are: 1.. Mild to moderate spinal stenosis at L2-L3. There does not appear to be any nerve root compression at this level. However, there is some encroachment upon the exiting right L2 nerve root. 2. Moderately severe spinal stenosis at L3-L4. There is no definite nerve root compression at this level but there is some  encroachment upon bilateral exiting L3 and traversing L4 nerve roots.  3. Moderate spinal stenosis at L4-L5. There is possible left L5 nerve root compression at this level and there is also some encroachment upon the right L5 and left L4 nerve roots. 4. There is milder degenerative changes at L1-L2 and L5-S1 with less potential for nerve root impingement.  * MRI scan images were reviewed online. I agree with the written report.    MRI cervical 02/11/15:  IMPRESSION: This is an abnormal MRI of the cervical spine showing the following: 1. Spinal stenosis at C4-C5. There is moderately severe right and moderate left foraminal narrowing. This could lead to right C5 nerve root compression and there is also some encroachment upon the left C5 nerve root. 2. 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 The neural foramina are widely patent at this level. 3. Mild spinal stenosis at C6-C7. There is no nerve root compression or significant nerve root encroachment at this level.    Assessment/Plan:  1. Gait disorder  2. Headache  3. Urinary incontinence  4. Cerebrovascular disease, left hemiparesis  The patient has not had an obvious new stroke event by MRI of the brain. The MRI of the cervical spine does not show cord compression, but there is some spinal stenosis in the low back. The patient reports back pain and left leg discomfort, she has an ongoing issue with a left hemiparesis. The gait disorder remain severe. The patient does not have any clear evidence of parkinsonism. The patient will be set up for an epidural steroid injection of the low back. She will get ongoing physical therapy. The patient continues to have headache, I will place her on low-dose Topamax. She will follow-up in August 2016.  Jill Alexanders MD 03/12/2015 7:26 PM  [facility] [address] [place], [place] [address]  Phone [phone] Fax  [phone]

## 2015-03-12 NOTE — Patient Instructions (Signed)

## 2015-03-14 DIAGNOSIS — M1990 Unspecified osteoarthritis, unspecified site: Secondary | ICD-10-CM | POA: Diagnosis not present

## 2015-03-14 DIAGNOSIS — J449 Chronic obstructive pulmonary disease, unspecified: Secondary | ICD-10-CM | POA: Diagnosis not present

## 2015-03-14 DIAGNOSIS — Z853 Personal history of malignant neoplasm of breast: Secondary | ICD-10-CM | POA: Diagnosis not present

## 2015-03-14 DIAGNOSIS — R2689 Other abnormalities of gait and mobility: Secondary | ICD-10-CM | POA: Diagnosis not present

## 2015-03-14 DIAGNOSIS — S92902D Unspecified fracture of left foot, subsequent encounter for fracture with routine healing: Secondary | ICD-10-CM | POA: Diagnosis not present

## 2015-03-14 DIAGNOSIS — M545 Low back pain: Secondary | ICD-10-CM | POA: Diagnosis not present

## 2015-03-14 DIAGNOSIS — I951 Orthostatic hypotension: Secondary | ICD-10-CM | POA: Diagnosis not present

## 2015-03-14 DIAGNOSIS — R291 Meningismus: Secondary | ICD-10-CM | POA: Diagnosis not present

## 2015-03-14 DIAGNOSIS — R296 Repeated falls: Secondary | ICD-10-CM | POA: Diagnosis not present

## 2015-03-15 ENCOUNTER — Ambulatory Visit (INDEPENDENT_AMBULATORY_CARE_PROVIDER_SITE_OTHER): Payer: Commercial Managed Care - HMO | Admitting: Urology

## 2015-03-15 DIAGNOSIS — N3941 Urge incontinence: Secondary | ICD-10-CM

## 2015-03-15 DIAGNOSIS — N952 Postmenopausal atrophic vaginitis: Secondary | ICD-10-CM | POA: Diagnosis not present

## 2015-03-15 DIAGNOSIS — R351 Nocturia: Secondary | ICD-10-CM | POA: Diagnosis not present

## 2015-03-15 DIAGNOSIS — R32 Unspecified urinary incontinence: Secondary | ICD-10-CM | POA: Diagnosis not present

## 2015-03-19 DIAGNOSIS — R296 Repeated falls: Secondary | ICD-10-CM | POA: Diagnosis not present

## 2015-03-19 DIAGNOSIS — M545 Low back pain: Secondary | ICD-10-CM | POA: Diagnosis not present

## 2015-03-19 DIAGNOSIS — I951 Orthostatic hypotension: Secondary | ICD-10-CM | POA: Diagnosis not present

## 2015-03-19 DIAGNOSIS — R2689 Other abnormalities of gait and mobility: Secondary | ICD-10-CM | POA: Diagnosis not present

## 2015-03-20 ENCOUNTER — Telehealth: Payer: Self-pay | Admitting: Neurology

## 2015-03-21 ENCOUNTER — Telehealth: Payer: Self-pay

## 2015-03-21 DIAGNOSIS — H903 Sensorineural hearing loss, bilateral: Secondary | ICD-10-CM | POA: Diagnosis not present

## 2015-03-21 DIAGNOSIS — R42 Dizziness and giddiness: Secondary | ICD-10-CM | POA: Diagnosis not present

## 2015-03-21 NOTE — Telephone Encounter (Signed)
I received a message from [facility] stating the patient was calling to follow up on the status of her epidural injection. I called Danielle at [facility]. They are submitting the request to the patient's insurance and will call her in the next few business days to schedule an appointment. I called the patient and let her know this. She will call me back if she has not heard from them in several business days.

## 2015-03-22 DIAGNOSIS — R296 Repeated falls: Secondary | ICD-10-CM | POA: Diagnosis not present

## 2015-03-22 DIAGNOSIS — R291 Meningismus: Secondary | ICD-10-CM | POA: Diagnosis not present

## 2015-03-22 DIAGNOSIS — M1990 Unspecified osteoarthritis, unspecified site: Secondary | ICD-10-CM | POA: Diagnosis not present

## 2015-03-22 DIAGNOSIS — J449 Chronic obstructive pulmonary disease, unspecified: Secondary | ICD-10-CM | POA: Diagnosis not present

## 2015-03-22 DIAGNOSIS — Z853 Personal history of malignant neoplasm of breast: Secondary | ICD-10-CM | POA: Diagnosis not present

## 2015-03-22 DIAGNOSIS — S92902D Unspecified fracture of left foot, subsequent encounter for fracture with routine healing: Secondary | ICD-10-CM | POA: Diagnosis not present

## 2015-03-22 DIAGNOSIS — M545 Low back pain: Secondary | ICD-10-CM | POA: Diagnosis not present

## 2015-03-22 DIAGNOSIS — R2689 Other abnormalities of gait and mobility: Secondary | ICD-10-CM | POA: Diagnosis not present

## 2015-03-22 DIAGNOSIS — I951 Orthostatic hypotension: Secondary | ICD-10-CM | POA: Diagnosis not present

## 2015-03-26 DIAGNOSIS — I951 Orthostatic hypotension: Secondary | ICD-10-CM | POA: Diagnosis not present

## 2015-03-26 DIAGNOSIS — M1990 Unspecified osteoarthritis, unspecified site: Secondary | ICD-10-CM | POA: Diagnosis not present

## 2015-03-26 DIAGNOSIS — Z853 Personal history of malignant neoplasm of breast: Secondary | ICD-10-CM | POA: Diagnosis not present

## 2015-03-26 DIAGNOSIS — J449 Chronic obstructive pulmonary disease, unspecified: Secondary | ICD-10-CM | POA: Diagnosis not present

## 2015-03-26 DIAGNOSIS — R2689 Other abnormalities of gait and mobility: Secondary | ICD-10-CM | POA: Diagnosis not present

## 2015-03-26 DIAGNOSIS — S92902D Unspecified fracture of left foot, subsequent encounter for fracture with routine healing: Secondary | ICD-10-CM | POA: Diagnosis not present

## 2015-03-26 DIAGNOSIS — R296 Repeated falls: Secondary | ICD-10-CM | POA: Diagnosis not present

## 2015-03-26 DIAGNOSIS — R291 Meningismus: Secondary | ICD-10-CM | POA: Diagnosis not present

## 2015-03-26 DIAGNOSIS — M545 Low back pain: Secondary | ICD-10-CM | POA: Diagnosis not present

## 2015-03-27 DIAGNOSIS — R3915 Urgency of urination: Secondary | ICD-10-CM | POA: Diagnosis not present

## 2015-03-27 DIAGNOSIS — N3941 Urge incontinence: Secondary | ICD-10-CM | POA: Diagnosis not present

## 2015-03-28 ENCOUNTER — Other Ambulatory Visit: Payer: Self-pay | Admitting: Neurology

## 2015-03-28 DIAGNOSIS — M1990 Unspecified osteoarthritis, unspecified site: Secondary | ICD-10-CM | POA: Diagnosis not present

## 2015-03-28 DIAGNOSIS — Z853 Personal history of malignant neoplasm of breast: Secondary | ICD-10-CM | POA: Diagnosis not present

## 2015-03-28 DIAGNOSIS — I951 Orthostatic hypotension: Secondary | ICD-10-CM | POA: Diagnosis not present

## 2015-03-28 DIAGNOSIS — M48061 Spinal stenosis, lumbar region without neurogenic claudication: Secondary | ICD-10-CM

## 2015-03-28 DIAGNOSIS — J449 Chronic obstructive pulmonary disease, unspecified: Secondary | ICD-10-CM | POA: Diagnosis not present

## 2015-03-28 DIAGNOSIS — R2689 Other abnormalities of gait and mobility: Secondary | ICD-10-CM | POA: Diagnosis not present

## 2015-03-28 DIAGNOSIS — M545 Low back pain: Secondary | ICD-10-CM | POA: Diagnosis not present

## 2015-03-28 DIAGNOSIS — R296 Repeated falls: Secondary | ICD-10-CM | POA: Diagnosis not present

## 2015-03-28 DIAGNOSIS — S92902D Unspecified fracture of left foot, subsequent encounter for fracture with routine healing: Secondary | ICD-10-CM | POA: Diagnosis not present

## 2015-03-28 DIAGNOSIS — R291 Meningismus: Secondary | ICD-10-CM | POA: Diagnosis not present

## 2015-03-29 DIAGNOSIS — S92902D Unspecified fracture of left foot, subsequent encounter for fracture with routine healing: Secondary | ICD-10-CM | POA: Diagnosis not present

## 2015-03-29 DIAGNOSIS — M545 Low back pain: Secondary | ICD-10-CM | POA: Diagnosis not present

## 2015-03-29 DIAGNOSIS — Z853 Personal history of malignant neoplasm of breast: Secondary | ICD-10-CM | POA: Diagnosis not present

## 2015-03-29 DIAGNOSIS — R296 Repeated falls: Secondary | ICD-10-CM | POA: Diagnosis not present

## 2015-03-29 DIAGNOSIS — M1990 Unspecified osteoarthritis, unspecified site: Secondary | ICD-10-CM | POA: Diagnosis not present

## 2015-03-29 DIAGNOSIS — R291 Meningismus: Secondary | ICD-10-CM | POA: Diagnosis not present

## 2015-03-29 DIAGNOSIS — I951 Orthostatic hypotension: Secondary | ICD-10-CM | POA: Diagnosis not present

## 2015-03-29 DIAGNOSIS — J449 Chronic obstructive pulmonary disease, unspecified: Secondary | ICD-10-CM | POA: Diagnosis not present

## 2015-03-29 DIAGNOSIS — R2689 Other abnormalities of gait and mobility: Secondary | ICD-10-CM | POA: Diagnosis not present

## 2015-04-01 ENCOUNTER — Other Ambulatory Visit: Payer: Self-pay

## 2015-04-01 DIAGNOSIS — Z853 Personal history of malignant neoplasm of breast: Secondary | ICD-10-CM | POA: Diagnosis not present

## 2015-04-01 DIAGNOSIS — R2689 Other abnormalities of gait and mobility: Secondary | ICD-10-CM | POA: Diagnosis not present

## 2015-04-01 DIAGNOSIS — J449 Chronic obstructive pulmonary disease, unspecified: Secondary | ICD-10-CM | POA: Diagnosis not present

## 2015-04-01 DIAGNOSIS — M545 Low back pain: Secondary | ICD-10-CM | POA: Diagnosis not present

## 2015-04-01 DIAGNOSIS — R291 Meningismus: Secondary | ICD-10-CM | POA: Diagnosis not present

## 2015-04-01 DIAGNOSIS — R296 Repeated falls: Secondary | ICD-10-CM | POA: Diagnosis not present

## 2015-04-01 DIAGNOSIS — I951 Orthostatic hypotension: Secondary | ICD-10-CM | POA: Diagnosis not present

## 2015-04-01 DIAGNOSIS — M1990 Unspecified osteoarthritis, unspecified site: Secondary | ICD-10-CM | POA: Diagnosis not present

## 2015-04-01 DIAGNOSIS — S92902D Unspecified fracture of left foot, subsequent encounter for fracture with routine healing: Secondary | ICD-10-CM | POA: Diagnosis not present

## 2015-04-04 DIAGNOSIS — J449 Chronic obstructive pulmonary disease, unspecified: Secondary | ICD-10-CM | POA: Diagnosis not present

## 2015-04-04 DIAGNOSIS — M1990 Unspecified osteoarthritis, unspecified site: Secondary | ICD-10-CM | POA: Diagnosis not present

## 2015-04-04 DIAGNOSIS — R296 Repeated falls: Secondary | ICD-10-CM | POA: Diagnosis not present

## 2015-04-04 DIAGNOSIS — I951 Orthostatic hypotension: Secondary | ICD-10-CM | POA: Diagnosis not present

## 2015-04-04 DIAGNOSIS — R291 Meningismus: Secondary | ICD-10-CM | POA: Diagnosis not present

## 2015-04-04 DIAGNOSIS — Z853 Personal history of malignant neoplasm of breast: Secondary | ICD-10-CM | POA: Diagnosis not present

## 2015-04-04 DIAGNOSIS — S92902D Unspecified fracture of left foot, subsequent encounter for fracture with routine healing: Secondary | ICD-10-CM | POA: Diagnosis not present

## 2015-04-04 DIAGNOSIS — R2689 Other abnormalities of gait and mobility: Secondary | ICD-10-CM | POA: Diagnosis not present

## 2015-04-04 DIAGNOSIS — M545 Low back pain: Secondary | ICD-10-CM | POA: Diagnosis not present

## 2015-04-05 DIAGNOSIS — J449 Chronic obstructive pulmonary disease, unspecified: Secondary | ICD-10-CM | POA: Diagnosis not present

## 2015-04-05 DIAGNOSIS — Z853 Personal history of malignant neoplasm of breast: Secondary | ICD-10-CM | POA: Diagnosis not present

## 2015-04-05 DIAGNOSIS — M545 Low back pain: Secondary | ICD-10-CM | POA: Diagnosis not present

## 2015-04-05 DIAGNOSIS — R291 Meningismus: Secondary | ICD-10-CM | POA: Diagnosis not present

## 2015-04-05 DIAGNOSIS — I951 Orthostatic hypotension: Secondary | ICD-10-CM | POA: Diagnosis not present

## 2015-04-05 DIAGNOSIS — R296 Repeated falls: Secondary | ICD-10-CM | POA: Diagnosis not present

## 2015-04-05 DIAGNOSIS — H3532 Exudative age-related macular degeneration: Secondary | ICD-10-CM | POA: Diagnosis not present

## 2015-04-05 DIAGNOSIS — M1990 Unspecified osteoarthritis, unspecified site: Secondary | ICD-10-CM | POA: Diagnosis not present

## 2015-04-05 DIAGNOSIS — R2689 Other abnormalities of gait and mobility: Secondary | ICD-10-CM | POA: Diagnosis not present

## 2015-04-05 DIAGNOSIS — S92902D Unspecified fracture of left foot, subsequent encounter for fracture with routine healing: Secondary | ICD-10-CM | POA: Diagnosis not present

## 2015-04-10 DIAGNOSIS — R291 Meningismus: Secondary | ICD-10-CM | POA: Diagnosis not present

## 2015-04-10 DIAGNOSIS — M545 Low back pain: Secondary | ICD-10-CM | POA: Diagnosis not present

## 2015-04-10 DIAGNOSIS — J449 Chronic obstructive pulmonary disease, unspecified: Secondary | ICD-10-CM | POA: Diagnosis not present

## 2015-04-10 DIAGNOSIS — S92902D Unspecified fracture of left foot, subsequent encounter for fracture with routine healing: Secondary | ICD-10-CM | POA: Diagnosis not present

## 2015-04-10 DIAGNOSIS — M1990 Unspecified osteoarthritis, unspecified site: Secondary | ICD-10-CM | POA: Diagnosis not present

## 2015-04-10 DIAGNOSIS — R296 Repeated falls: Secondary | ICD-10-CM | POA: Diagnosis not present

## 2015-04-10 DIAGNOSIS — R2689 Other abnormalities of gait and mobility: Secondary | ICD-10-CM | POA: Diagnosis not present

## 2015-04-10 DIAGNOSIS — Z853 Personal history of malignant neoplasm of breast: Secondary | ICD-10-CM | POA: Diagnosis not present

## 2015-04-10 DIAGNOSIS — I951 Orthostatic hypotension: Secondary | ICD-10-CM | POA: Diagnosis not present

## 2015-04-11 ENCOUNTER — Ambulatory Visit (HOSPITAL_COMMUNITY)
Admission: RE | Admit: 2015-04-11 | Discharge: 2015-04-11 | Disposition: A | Discharge status: Home / Self Care | Payer: Commercial Managed Care - HMO | Source: Ambulatory Visit | Attending: Pulmonary Disease | Admitting: Pulmonary Disease

## 2015-04-11 ENCOUNTER — Other Ambulatory Visit (HOSPITAL_COMMUNITY): Payer: Self-pay | Admitting: Pulmonary Disease

## 2015-04-11 DIAGNOSIS — M25562 Pain in left knee: Secondary | ICD-10-CM | POA: Insufficient documentation

## 2015-04-11 DIAGNOSIS — I951 Orthostatic hypotension: Secondary | ICD-10-CM | POA: Diagnosis not present

## 2015-04-11 DIAGNOSIS — I10 Essential (primary) hypertension: Secondary | ICD-10-CM | POA: Diagnosis not present

## 2015-04-11 DIAGNOSIS — M25522 Pain in left elbow: Secondary | ICD-10-CM

## 2015-04-11 DIAGNOSIS — S8992XA Unspecified injury of left lower leg, initial encounter: Secondary | ICD-10-CM | POA: Diagnosis not present

## 2015-04-11 DIAGNOSIS — I6350 Cerebral infarction due to unspecified occlusion or stenosis of unspecified cerebral artery: Secondary | ICD-10-CM | POA: Diagnosis not present

## 2015-04-11 DIAGNOSIS — R296 Repeated falls: Secondary | ICD-10-CM | POA: Diagnosis not present

## 2015-04-12 DIAGNOSIS — J449 Chronic obstructive pulmonary disease, unspecified: Secondary | ICD-10-CM | POA: Diagnosis not present

## 2015-04-12 DIAGNOSIS — M545 Low back pain: Secondary | ICD-10-CM | POA: Diagnosis not present

## 2015-04-12 DIAGNOSIS — Z853 Personal history of malignant neoplasm of breast: Secondary | ICD-10-CM | POA: Diagnosis not present

## 2015-04-12 DIAGNOSIS — I951 Orthostatic hypotension: Secondary | ICD-10-CM | POA: Diagnosis not present

## 2015-04-12 DIAGNOSIS — R291 Meningismus: Secondary | ICD-10-CM | POA: Diagnosis not present

## 2015-04-12 DIAGNOSIS — M1990 Unspecified osteoarthritis, unspecified site: Secondary | ICD-10-CM | POA: Diagnosis not present

## 2015-04-12 DIAGNOSIS — R296 Repeated falls: Secondary | ICD-10-CM | POA: Diagnosis not present

## 2015-04-12 DIAGNOSIS — S92902D Unspecified fracture of left foot, subsequent encounter for fracture with routine healing: Secondary | ICD-10-CM | POA: Diagnosis not present

## 2015-04-12 DIAGNOSIS — R2689 Other abnormalities of gait and mobility: Secondary | ICD-10-CM | POA: Diagnosis not present

## 2015-04-13 DIAGNOSIS — M1990 Unspecified osteoarthritis, unspecified site: Secondary | ICD-10-CM | POA: Diagnosis not present

## 2015-04-13 DIAGNOSIS — R2689 Other abnormalities of gait and mobility: Secondary | ICD-10-CM | POA: Diagnosis not present

## 2015-04-13 DIAGNOSIS — Z853 Personal history of malignant neoplasm of breast: Secondary | ICD-10-CM | POA: Diagnosis not present

## 2015-04-13 DIAGNOSIS — R296 Repeated falls: Secondary | ICD-10-CM | POA: Diagnosis not present

## 2015-04-13 DIAGNOSIS — R291 Meningismus: Secondary | ICD-10-CM | POA: Diagnosis not present

## 2015-04-13 DIAGNOSIS — I951 Orthostatic hypotension: Secondary | ICD-10-CM | POA: Diagnosis not present

## 2015-04-13 DIAGNOSIS — J449 Chronic obstructive pulmonary disease, unspecified: Secondary | ICD-10-CM | POA: Diagnosis not present

## 2015-04-13 DIAGNOSIS — M545 Low back pain: Secondary | ICD-10-CM | POA: Diagnosis not present

## 2015-04-13 DIAGNOSIS — S92902D Unspecified fracture of left foot, subsequent encounter for fracture with routine healing: Secondary | ICD-10-CM | POA: Diagnosis not present

## 2015-04-15 DIAGNOSIS — R42 Dizziness and giddiness: Secondary | ICD-10-CM | POA: Diagnosis not present

## 2015-04-15 DIAGNOSIS — H903 Sensorineural hearing loss, bilateral: Secondary | ICD-10-CM | POA: Diagnosis not present

## 2015-04-16 ENCOUNTER — Ambulatory Visit
Admission: RE | Admit: 2015-04-16 | Discharge: 2015-04-16 | Disposition: A | Discharge status: Home / Self Care | Payer: Commercial Managed Care - HMO | Source: Ambulatory Visit | Attending: Neurology | Admitting: Neurology

## 2015-04-16 DIAGNOSIS — M48061 Spinal stenosis, lumbar region without neurogenic claudication: Secondary | ICD-10-CM

## 2015-04-16 DIAGNOSIS — M545 Low back pain: Secondary | ICD-10-CM | POA: Diagnosis not present

## 2015-04-16 MED ORDER — METHYLPREDNISOLONE ACETATE 40 MG/ML INJ SUSP (RADIOLOG
120.0000 mg | Freq: Once | INTRAMUSCULAR | Status: AC
  Administered 2015-04-16: 120 mg via EPIDURAL

## 2015-04-16 MED ORDER — IOHEXOL 180 MG/ML  SOLN
1.0000 mL | Freq: Once | INTRAMUSCULAR | Status: AC | PRN
  Administered 2015-04-16: 1 mL via EPIDURAL

## 2015-04-16 NOTE — Discharge Instructions (Signed)

## 2015-04-17 DIAGNOSIS — I951 Orthostatic hypotension: Secondary | ICD-10-CM | POA: Diagnosis not present

## 2015-04-17 DIAGNOSIS — R2689 Other abnormalities of gait and mobility: Secondary | ICD-10-CM | POA: Diagnosis not present

## 2015-04-17 DIAGNOSIS — R296 Repeated falls: Secondary | ICD-10-CM | POA: Diagnosis not present

## 2015-04-17 DIAGNOSIS — M1990 Unspecified osteoarthritis, unspecified site: Secondary | ICD-10-CM | POA: Diagnosis not present

## 2015-04-17 DIAGNOSIS — S92902D Unspecified fracture of left foot, subsequent encounter for fracture with routine healing: Secondary | ICD-10-CM | POA: Diagnosis not present

## 2015-04-17 DIAGNOSIS — J449 Chronic obstructive pulmonary disease, unspecified: Secondary | ICD-10-CM | POA: Diagnosis not present

## 2015-04-17 DIAGNOSIS — M545 Low back pain: Secondary | ICD-10-CM | POA: Diagnosis not present

## 2015-04-17 DIAGNOSIS — R291 Meningismus: Secondary | ICD-10-CM | POA: Diagnosis not present

## 2015-04-17 DIAGNOSIS — Z853 Personal history of malignant neoplasm of breast: Secondary | ICD-10-CM | POA: Diagnosis not present

## 2015-04-18 DIAGNOSIS — M545 Low back pain: Secondary | ICD-10-CM | POA: Diagnosis not present

## 2015-04-18 DIAGNOSIS — R296 Repeated falls: Secondary | ICD-10-CM | POA: Diagnosis not present

## 2015-04-18 DIAGNOSIS — R291 Meningismus: Secondary | ICD-10-CM | POA: Diagnosis not present

## 2015-04-18 DIAGNOSIS — J449 Chronic obstructive pulmonary disease, unspecified: Secondary | ICD-10-CM | POA: Diagnosis not present

## 2015-04-18 DIAGNOSIS — R2689 Other abnormalities of gait and mobility: Secondary | ICD-10-CM | POA: Diagnosis not present

## 2015-04-18 DIAGNOSIS — I951 Orthostatic hypotension: Secondary | ICD-10-CM | POA: Diagnosis not present

## 2015-04-18 DIAGNOSIS — M1990 Unspecified osteoarthritis, unspecified site: Secondary | ICD-10-CM | POA: Diagnosis not present

## 2015-04-18 DIAGNOSIS — Z853 Personal history of malignant neoplasm of breast: Secondary | ICD-10-CM | POA: Diagnosis not present

## 2015-04-18 DIAGNOSIS — S92902D Unspecified fracture of left foot, subsequent encounter for fracture with routine healing: Secondary | ICD-10-CM | POA: Diagnosis not present

## 2015-04-22 ENCOUNTER — Inpatient Hospital Stay (HOSPITAL_COMMUNITY)
Admission: EM | Admit: 2015-04-22 | Discharge: 2015-04-27 | DRG: 389 | Disposition: A | Discharge status: Home Health | Payer: Commercial Managed Care - HMO | Attending: Pulmonary Disease | Admitting: Pulmonary Disease

## 2015-04-22 ENCOUNTER — Inpatient Hospital Stay (HOSPITAL_COMMUNITY): Payer: Commercial Managed Care - HMO

## 2015-04-22 ENCOUNTER — Encounter (HOSPITAL_COMMUNITY): Payer: Self-pay | Admitting: Emergency Medicine

## 2015-04-22 ENCOUNTER — Emergency Department (HOSPITAL_COMMUNITY): Payer: Commercial Managed Care - HMO

## 2015-04-22 DIAGNOSIS — K219 Gastro-esophageal reflux disease without esophagitis: Secondary | ICD-10-CM | POA: Diagnosis present

## 2015-04-22 DIAGNOSIS — N183 Chronic kidney disease, stage 3 (moderate): Secondary | ICD-10-CM | POA: Diagnosis present

## 2015-04-22 DIAGNOSIS — K5909 Other constipation: Secondary | ICD-10-CM | POA: Diagnosis not present

## 2015-04-22 DIAGNOSIS — R269 Unspecified abnormalities of gait and mobility: Secondary | ICD-10-CM | POA: Diagnosis not present

## 2015-04-22 DIAGNOSIS — Z8673 Personal history of transient ischemic attack (TIA), and cerebral infarction without residual deficits: Secondary | ICD-10-CM | POA: Diagnosis not present

## 2015-04-22 DIAGNOSIS — K5669 Other intestinal obstruction: Secondary | ICD-10-CM

## 2015-04-22 DIAGNOSIS — N390 Urinary tract infection, site not specified: Secondary | ICD-10-CM | POA: Diagnosis present

## 2015-04-22 DIAGNOSIS — J45909 Unspecified asthma, uncomplicated: Secondary | ICD-10-CM | POA: Diagnosis present

## 2015-04-22 DIAGNOSIS — Z4682 Encounter for fitting and adjustment of non-vascular catheter: Secondary | ICD-10-CM | POA: Diagnosis not present

## 2015-04-22 DIAGNOSIS — J449 Chronic obstructive pulmonary disease, unspecified: Secondary | ICD-10-CM | POA: Diagnosis present

## 2015-04-22 DIAGNOSIS — N261 Atrophy of kidney (terminal): Secondary | ICD-10-CM | POA: Diagnosis not present

## 2015-04-22 DIAGNOSIS — Z8249 Family history of ischemic heart disease and other diseases of the circulatory system: Secondary | ICD-10-CM | POA: Diagnosis not present

## 2015-04-22 DIAGNOSIS — K56600 Partial intestinal obstruction, unspecified as to cause: Secondary | ICD-10-CM

## 2015-04-22 DIAGNOSIS — I6930 Unspecified sequelae of cerebral infarction: Secondary | ICD-10-CM

## 2015-04-22 DIAGNOSIS — Z87891 Personal history of nicotine dependence: Secondary | ICD-10-CM

## 2015-04-22 DIAGNOSIS — Z90710 Acquired absence of both cervix and uterus: Secondary | ICD-10-CM

## 2015-04-22 DIAGNOSIS — R1031 Right lower quadrant pain: Secondary | ICD-10-CM | POA: Diagnosis not present

## 2015-04-22 DIAGNOSIS — Z853 Personal history of malignant neoplasm of breast: Secondary | ICD-10-CM

## 2015-04-22 DIAGNOSIS — I129 Hypertensive chronic kidney disease with stage 1 through stage 4 chronic kidney disease, or unspecified chronic kidney disease: Secondary | ICD-10-CM | POA: Diagnosis present

## 2015-04-22 DIAGNOSIS — Z978 Presence of other specified devices: Secondary | ICD-10-CM

## 2015-04-22 DIAGNOSIS — K56609 Unspecified intestinal obstruction, unspecified as to partial versus complete obstruction: Secondary | ICD-10-CM

## 2015-04-22 DIAGNOSIS — R109 Unspecified abdominal pain: Secondary | ICD-10-CM | POA: Diagnosis present

## 2015-04-22 DIAGNOSIS — R112 Nausea with vomiting, unspecified: Secondary | ICD-10-CM | POA: Diagnosis not present

## 2015-04-22 DIAGNOSIS — I10 Essential (primary) hypertension: Secondary | ICD-10-CM | POA: Diagnosis present

## 2015-04-22 DIAGNOSIS — M797 Fibromyalgia: Secondary | ICD-10-CM | POA: Diagnosis present

## 2015-04-22 DIAGNOSIS — R1084 Generalized abdominal pain: Secondary | ICD-10-CM | POA: Diagnosis not present

## 2015-04-22 DIAGNOSIS — K439 Ventral hernia without obstruction or gangrene: Secondary | ICD-10-CM | POA: Diagnosis not present

## 2015-04-22 DIAGNOSIS — K429 Umbilical hernia without obstruction or gangrene: Secondary | ICD-10-CM | POA: Diagnosis not present

## 2015-04-22 DIAGNOSIS — K828 Other specified diseases of gallbladder: Secondary | ICD-10-CM | POA: Diagnosis not present

## 2015-04-22 DIAGNOSIS — E860 Dehydration: Secondary | ICD-10-CM | POA: Diagnosis not present

## 2015-04-22 DIAGNOSIS — R296 Repeated falls: Secondary | ICD-10-CM | POA: Diagnosis present

## 2015-04-22 DIAGNOSIS — R0902 Hypoxemia: Secondary | ICD-10-CM

## 2015-04-22 DIAGNOSIS — R609 Edema, unspecified: Secondary | ICD-10-CM | POA: Diagnosis not present

## 2015-04-22 HISTORY — DX: Unspecified intestinal obstruction, unspecified as to partial versus complete obstruction: K56.609

## 2015-04-22 LAB — COMPREHENSIVE METABOLIC PANEL
ALT: 18 U/L (ref 14–54)
AST: 16 U/L (ref 15–41)
Albumin: 4 g/dL (ref 3.5–5.0)
Alkaline Phosphatase: 165 U/L — ABNORMAL HIGH (ref 38–126)
Anion gap: 11 (ref 5–15)
BILIRUBIN TOTAL: 0.7 mg/dL (ref 0.3–1.2)
BUN: 31 mg/dL — ABNORMAL HIGH (ref 6–20)
CO2: 25 mmol/L (ref 22–32)
Calcium: 8.7 mg/dL — ABNORMAL LOW (ref 8.9–10.3)
Chloride: 102 mmol/L (ref 101–111)
Creatinine, Ser: 1.21 mg/dL — ABNORMAL HIGH (ref 0.44–1.00)
GFR calc Af Amer: 45 mL/min — ABNORMAL LOW (ref >60)
GFR calc non Af Amer: 39 mL/min — ABNORMAL LOW (ref >60)
Glucose, Bld: 115 mg/dL — ABNORMAL HIGH (ref 65–99)
Potassium: 4.1 mmol/L (ref 3.5–5.1)
Sodium: 138 mmol/L (ref 135–145)
Total Protein: 7.1 g/dL (ref 6.5–8.1)

## 2015-04-22 LAB — URINALYSIS, ROUTINE W REFLEX MICROSCOPIC
Bilirubin Urine: NEGATIVE
Glucose, UA: NEGATIVE mg/dL
Hgb urine dipstick: NEGATIVE
Ketones, ur: NEGATIVE mg/dL
NITRITE: NEGATIVE
Protein, ur: NEGATIVE mg/dL
Specific Gravity, Urine: 1.015 (ref 1.005–1.030)
Urobilinogen, UA: 0.2 mg/dL (ref 0.0–1.0)
pH: 5.5 (ref 5.0–8.0)

## 2015-04-22 LAB — LIPASE, BLOOD: Lipase: 36 U/L (ref 22–51)

## 2015-04-22 LAB — CBC WITH DIFFERENTIAL/PLATELET
Basophils Absolute: 0 10*3/uL (ref 0.0–0.1)
Basophils Relative: 0 % (ref 0–1)
Eosinophils Absolute: 0.2 10*3/uL (ref 0.0–0.7)
Eosinophils Relative: 1 % (ref 0–5)
HCT: 41.3 % (ref 36.0–46.0)
Hemoglobin: 13.3 g/dL (ref 12.0–15.0)
LYMPHS ABS: 2 10*3/uL (ref 0.7–4.0)
LYMPHS PCT: 11 % — AB (ref 12–46)
MCH: 26.8 pg (ref 26.0–34.0)
MCHC: 32.2 g/dL (ref 30.0–36.0)
MCV: 83.3 fL (ref 78.0–100.0)
MONOS PCT: 5 % (ref 3–12)
Monocytes Absolute: 1 10*3/uL (ref 0.1–1.0)
NEUTROS ABS: 15.2 10*3/uL — AB (ref 1.7–7.7)
Neutrophils Relative %: 83 % — ABNORMAL HIGH (ref 43–77)
Platelets: 391 10*3/uL (ref 150–400)
RBC: 4.96 MIL/uL (ref 3.87–5.11)
RDW: 15.2 % (ref 11.5–15.5)
WBC: 18.3 10*3/uL — AB (ref 4.0–10.5)

## 2015-04-22 LAB — URINE MICROSCOPIC-ADD ON

## 2015-04-22 LAB — TROPONIN I: Troponin I: <0.03 ng/mL (ref <0.031)

## 2015-04-22 MED ORDER — DIPHENHYDRAMINE HCL 12.5 MG/5ML PO ELIX
12.5000 mg | ORAL_SOLUTION | Freq: Three times a day (TID) | ORAL | Status: DC | PRN
Start: 2015-04-22 — End: 2015-04-22
  Filled 2015-04-22: qty 5

## 2015-04-22 MED ORDER — DIPHENHYDRAMINE HCL 50 MG/ML IJ SOLN
12.5000 mg | Freq: Three times a day (TID) | INTRAMUSCULAR | Status: DC | PRN
  Administered 2015-04-22: 12.5 mg via INTRAVENOUS
  Filled 2015-04-22: qty 1

## 2015-04-22 MED ORDER — ONDANSETRON HCL 4 MG/2ML IJ SOLN
4.0000 mg | Freq: Four times a day (QID) | INTRAMUSCULAR | Status: DC | PRN
  Administered 2015-04-24 – 2015-04-26 (×2): 4 mg via INTRAVENOUS
  Filled 2015-04-22 (×2): qty 2

## 2015-04-22 MED ORDER — ALBUTEROL SULFATE (2.5 MG/3ML) 0.083% IN NEBU: 3.0000 mL | INHALATION_SOLUTION | Freq: Four times a day (QID) | RESPIRATORY_TRACT | Status: DC | PRN

## 2015-04-22 MED ORDER — MOMETASONE FURO-FORMOTEROL FUM 100-5 MCG/ACT IN AERO
2.0000 | INHALATION_SPRAY | Freq: Two times a day (BID) | RESPIRATORY_TRACT | Status: DC
  Administered 2015-04-23 – 2015-04-27 (×9): 2 via RESPIRATORY_TRACT
  Filled 2015-04-22: qty 8.8

## 2015-04-22 MED ORDER — ALPRAZOLAM 0.25 MG PO TABS: 0.2500 mg | ORAL_TABLET | Freq: Two times a day (BID) | ORAL | Status: DC | PRN

## 2015-04-22 MED ORDER — TOPIRAMATE 25 MG PO TABS
25.0000 mg | ORAL_TABLET | Freq: Every day | ORAL | Status: DC
  Filled 2015-04-22 (×2): qty 1

## 2015-04-22 MED ORDER — DIPHENHYDRAMINE HCL 50 MG/ML IJ SOLN
12.5000 mg | Freq: Once | INTRAMUSCULAR | Status: AC
  Administered 2015-04-22: 12.5 mg via INTRAVENOUS
  Filled 2015-04-22: qty 1

## 2015-04-22 MED ORDER — IOHEXOL 300 MG/ML  SOLN
75.0000 mL | Freq: Once | INTRAMUSCULAR | Status: AC | PRN
  Administered 2015-04-22: 75 mL via INTRAVENOUS

## 2015-04-22 MED ORDER — TOPIRAMATE 25 MG PO TABS
ORAL_TABLET | ORAL | Status: AC
  Filled 2015-04-22: qty 1

## 2015-04-22 MED ORDER — DICYCLOMINE HCL 10 MG PO CAPS
20.0000 mg | ORAL_CAPSULE | Freq: Every day | ORAL | Status: DC
  Administered 2015-04-23: 20 mg via ORAL
  Filled 2015-04-22: qty 2

## 2015-04-22 MED ORDER — ONDANSETRON HCL 4 MG/2ML IJ SOLN
4.0000 mg | Freq: Once | INTRAMUSCULAR | Status: AC
  Administered 2015-04-22: 4 mg via INTRAVENOUS
  Filled 2015-04-22: qty 2

## 2015-04-22 MED ORDER — SODIUM CHLORIDE 0.9 % IV SOLN
INTRAVENOUS | Status: AC
  Administered 2015-04-23: 01:00:00 via INTRAVENOUS

## 2015-04-22 MED ORDER — MORPHINE SULFATE 4 MG/ML IJ SOLN
4.0000 mg | INTRAMUSCULAR | Status: AC | PRN
  Administered 2015-04-22 – 2015-04-23 (×3): 4 mg via INTRAVENOUS
  Filled 2015-04-22 (×3): qty 1

## 2015-04-22 MED ORDER — ASPIRIN EC 81 MG PO TBEC
81.0000 mg | DELAYED_RELEASE_TABLET | Freq: Every day | ORAL | Status: DC
  Administered 2015-04-23: 81 mg via ORAL
  Filled 2015-04-22: qty 1

## 2015-04-22 MED ORDER — AMLODIPINE BESYLATE 5 MG PO TABS
5.0000 mg | ORAL_TABLET | Freq: Two times a day (BID) | ORAL | Status: DC
  Administered 2015-04-23: 5 mg via ORAL
  Filled 2015-04-22: qty 1

## 2015-04-22 MED ORDER — SODIUM CHLORIDE 0.9 % IJ SOLN
3.0000 mL | Freq: Two times a day (BID) | INTRAMUSCULAR | Status: DC
  Administered 2015-04-25 – 2015-04-27 (×4): 3 mL via INTRAVENOUS

## 2015-04-22 MED ORDER — IOHEXOL 300 MG/ML  SOLN
50.0000 mL | Freq: Once | INTRAMUSCULAR | Status: AC | PRN
  Administered 2015-04-22: 50 mL via ORAL

## 2015-04-22 MED ORDER — IOHEXOL 300 MG/ML  SOLN: 100.0000 mL | Freq: Once | INTRAMUSCULAR | Status: DC | PRN

## 2015-04-22 MED ORDER — HYDROMORPHONE HCL 1 MG/ML IJ SOLN
0.5000 mg | INTRAMUSCULAR | Status: DC | PRN
  Administered 2015-04-23 (×4): 0.5 mg via INTRAVENOUS
  Filled 2015-04-22 (×4): qty 1

## 2015-04-22 NOTE — ED Provider Notes (Signed)
CSN: [id number]     Arrival date & time 04/22/15  1353 History   First MD Initiated Contact with Patient 04/22/15 1410     Chief Complaint  Patient presents with  . Abdominal Pain  . Nausea      HPI  Impression presents for evaluation of abdominal pain. Has a distant history of Crohn's disease. Require treatment for this or needed surgical intervention in the past. Pulse with GI in [place].  Presents with abdominal pain for the last 2 days. No bowel movement since yesterday morning's. Some nausea and vomiting and feeling of distention and diffuse mid abdominal pain. Heme-negative nonbilious emesis. Last problem yesterday. No diarrhea or rectal bleeding. No chest pain shortness of breath. No fever. Urinating normally and without symptoms.  Surgical history includes lysis of adhesions and what sounds like possible small bowel obstruction in 1993. Removal of ovarian cyst, and abdominal hysterectomy the 40s and 50s.  Past Medical History  Diagnosis Date  . Hypertension   . Asthma   . Crohn disease   . COPD (chronic obstructive pulmonary disease)   . Malignant hypertension 02/02/2014  . Diastolic dysfunction [id number]  . Anemia 02/02/2014    Anemia panel WNL  . CAD (coronary artery disease) 2003    Cath with nonobstructive CAD  . Abnormality of gait 01/08/2015  . Heart murmur   . CHF (congestive heart failure)   . Pneumonia 2014; 2015  . History of blood transfusion     "I had 7; related to packing left inside me when I had a D&C; got blood poisoning"  . GERD (gastroesophageal reflux disease)   . Daily headache     "this last few months" (01/08/2015)  . Migraine     "when I was a young woman" (01/08/2015)  . Stroke 03/2014    "left leg weak; lost of strength in my left side; slight tremors; some speach problems since" (01/08/2015)  . Arthritis     "left knee" (01/08/2015)  . Fibromyalgia   . Chronic lower back pain   . Anxiety   . Breast cancer     "I've had it in both  breasts"  . Macular hole, both eyes     "both wet and dry"  . OAB (overactive bladder)    Past Surgical History  Procedure Laterality Date  . Mastectomy, partial Right 1999  . Back surgery    . Knee arthroscopy Right 2005  . Abdominal hysterectomy  1954  . Ovarian cyst removal  1946  . Cataract extraction w/ intraocular lens implant Right 2008  . Dilation and curettage of uterus  1953    S/P miscarriage  . Hemorrhoid surgery  1958  . Anterior cervical decomp/discectomy fusion  1971    "used bone off of my right hip"  . Abdominal adhesion surgery  1993    from colon  . Breast lumpectomy Left 2006  . Patella fracture surgery Left 2008  . Cataract extraction w/ intraocular lens implant Left 2015  . Foot fracture surgery Left 12/2014  . Fracture surgery    . Cardiac catheterization  11/2001   Family History  Problem Relation Age of Onset  . Heart attack Mother   . CAD Mother   . Heart attack Sister   . Cancer - Ovarian Sister   . Cancer - Colon Father    History  Substance Use Topics  . Smoking status: Former Smoker -- 0.50 packs/day for 15 years    Types: Cigarettes  . Smokeless  tobacco: Never Used     Comment: QUIT 1988  . Alcohol Use: No   OB History    No data available     Review of Systems  Constitutional: Negative for fever, chills, diaphoresis, appetite change and fatigue.  HENT: Negative for mouth sores, sore throat and trouble swallowing.   Eyes: Negative for visual disturbance.  Respiratory: Negative for cough, chest tightness, shortness of breath and wheezing.   Cardiovascular: Negative for chest pain.  Gastrointestinal: Positive for nausea, vomiting, abdominal pain and constipation. Negative for diarrhea and abdominal distention.  Endocrine: Negative for polydipsia, polyphagia and polyuria.  Genitourinary: Negative for dysuria, frequency and hematuria.  Musculoskeletal: Negative for gait problem.  Skin: Negative for color change, pallor and rash.    Neurological: Negative for dizziness, syncope, light-headedness and headaches.  Hematological: Does not bruise/bleed easily.  Psychiatric/Behavioral: Negative for behavioral problems and confusion.      Allergies  Zithromax; Celebrex; Ciprofloxacin; Metronidazole; Moxifloxacin; Penicillins; Polymyxin b; Sulfa antibiotics; Vancomycin; and Vioxx  Home Medications   Prior to Admission medications   Medication Sig Start Date End Date Taking? Authorizing Provider  albuterol (PROVENTIL HFA;VENTOLIN HFA) 108 (90 BASE) MCG/ACT inhaler Inhale 2 puffs into the lungs every 6 (six) hours as needed for wheezing or shortness of breath.   Yes Historical Provider, MD  ALPRAZolam (XANAX) 0.25 MG tablet Take 0.25 mg by mouth 2 (two) times daily as needed for anxiety.   Yes Historical Provider, MD  amLODipine (NORVASC) 5 MG tablet Take 5 mg AM and 5 mg PM Patient taking differently: Take 5 mg by mouth 2 (two) times daily. Take 5 mg AM and 5 mg PM 09/14/14  Yes Herminio Commons, MD  aspirin EC 81 MG tablet Take 81 mg by mouth daily.   Yes Historical Provider, MD  Cyanocobalamin 2500 MCG CHEW Chew 1 tablet by mouth daily.   Yes Historical Provider, MD  dicyclomine (BENTYL) 20 MG tablet Take 20 mg by mouth daily.    Yes Historical Provider, MD  esomeprazole (NEXIUM) 20 MG capsule Take 20 mg by mouth daily at 12 noon.   Yes Historical Provider, MD  fexofenadine (ALLEGRA) 180 MG tablet Take 180 mg by mouth daily.   Yes Historical Provider, MD  Fluticasone-Salmeterol (ADVAIR) 250-50 MCG/DOSE AEPB Inhale 1 puff into the lungs daily.    Yes Historical Provider, MD  ibuprofen (ADVIL,MOTRIN) 200 MG tablet Take 200 mg by mouth every 6 (six) hours as needed for mild pain or moderate pain.    Yes Historical Provider, MD  Multiple Vitamins-Minerals (PRESERVISION AREDS PO) Take 1 tablet by mouth daily.   Yes Historical Provider, MD  Probiotic Product (ALIGN PO) Take 1 capsule by mouth daily.   Yes Historical Provider,  MD  topiramate (TOPAMAX) 25 MG tablet Take 1 tablet (25 mg total) by mouth at bedtime. 03/12/15  Yes Kathrynn Ducking, MD   BP 160/75 mmHg  Pulse 85  Temp(Src) 97.8 F (36.6 C) (Oral)  Resp 22  Ht 5\' 3"  (1.6 m)  Wt 193 lb (87.544 kg)  BMI 34.20 kg/m2  SpO2 97% Physical Exam  Constitutional: She is oriented to person, place, and time. She appears well-developed and well-nourished. No distress.  HENT:  Head: Normocephalic.  Eyes: Conjunctivae are normal. Pupils are equal, round, and reactive to light. No scleral icterus.  Neck: Normal range of motion. Neck supple. No thyromegaly present.  Cardiovascular: Normal rate and regular rhythm.  Exam reveals no gallop and no friction rub.  No murmur heard. Pulmonary/Chest: Effort normal and breath sounds normal. No respiratory distress. She has no wheezes. She has no rales.  Abdominal: Soft. Bowel sounds are normal. She exhibits distension. There is generalized tenderness. There is no rebound.    Musculoskeletal: Normal range of motion.  Neurological: She is alert and oriented to person, place, and time.  Skin: Skin is warm and dry. No rash noted.  Psychiatric: She has a normal mood and affect. Her behavior is normal.    ED Course  Procedures (including critical care time) Labs Review Labs Reviewed  CBC WITH DIFFERENTIAL/PLATELET - Abnormal; Notable for the following:    WBC 18.3 (*)    Neutrophils Relative % 83 (*)    Neutro Abs 15.2 (*)    Lymphocytes Relative 11 (*)    All other components within normal limits  COMPREHENSIVE METABOLIC PANEL - Abnormal; Notable for the following:    Glucose, Bld 115 (*)    BUN 31 (*)    Creatinine, Ser 1.21 (*)    Calcium 8.7 (*)    Alkaline Phosphatase 165 (*)    GFR calc non Af Amer 39 (*)    GFR calc Af Amer 45 (*)    All other components within normal limits  URINALYSIS, ROUTINE W REFLEX MICROSCOPIC (NOT AT [facility]) - Abnormal; Notable for the following:    Leukocytes, UA TRACE (*)    All  other components within normal limits  URINE MICROSCOPIC-ADD ON - Abnormal; Notable for the following:    Squamous Epithelial / LPF FEW (*)    Bacteria, UA MANY (*)    All other components within normal limits  LIPASE, BLOOD    Imaging Review Dg Chest 2 View  04/22/2015   CLINICAL DATA:  Nausea and vomiting dating infant today, hypoxia, history hypertension, asthma, Crohn's disease, COPD, coronary artery disease, CHF, breast cancer  EXAM: CHEST  2 VIEW  COMPARISON:  02/04/2015  FINDINGS: Nasogastric tube extends to inferior mediastinum with proximal side-port at the mid chest; recommend advancing tube 15 cm.  Enlargement of cardiac silhouette.  Atherosclerotic calcification aorta.  Emphysematous and bronchitic changes with bibasilar atelectasis.  No definite acute infiltrate, pleural effusion or pneumothorax.  Underpenetration at retrocardiac LEFT lower lobe.  Bones demineralized.  IMPRESSION: Tip of nasogastric tube within esophagus; recommend advancing tube 15 cm.  COPD changes with bibasilar atelectasis.  Findings called to [place] in ED on 04/22/2015 at 2025 hours.   Electronically Signed   By: Lavonia Dana M.D.   On: 04/22/2015 20:25   Ct Abdomen Pelvis W Contrast  04/22/2015   CLINICAL DATA:  Generalized abdominal pain.  EXAM: CT ABDOMEN AND PELVIS WITH CONTRAST  TECHNIQUE: Multidetector CT imaging of the abdomen and pelvis was performed using the standard protocol following bolus administration of intravenous contrast.  CONTRAST:  61mL OMNIPAQUE IOHEXOL 300 MG/ML SOLN, 16mL OMNIPAQUE IOHEXOL 300 MG/ML SOLN  COMPARISON:  CT scan of May 24, 2012.  FINDINGS: Multilevel degenerative disc disease is noted in the lower lumbar spine. Visualized lung bases appear normal.  Mild distention of gallbladder is noted without inflammation or cholelithiasis. The liver, spleen and pancreas appear normal. Adrenal glands appear normal. Severe left renal atrophy is noted. Mild right hydronephrosis and proximal  ureteral dilatation is noted without obstructing calculus. Distal right ureter appears normal in caliber. Urinary bladder appears normal. Fat containing ventral hernia is noted in the pelvis. Small periumbilical hernia is noted which contains a portion of transverse colon, but does not result  in obstruction. Atherosclerosis of abdominal aorta is noted without aneurysm formation.  Sigmoid diverticulosis is noted without inflammation. There appears to be distal small bowel obstruction with transition zone seen in the left lower quadrant of the abdomen anteriorly best seen on image number 55 of series 2. The more distal ileum appears to be normal in caliber. Status post hysterectomy. No significant adenopathy is noted.  IMPRESSION: Sigmoid diverticulosis is noted without inflammation.  Atherosclerosis of abdominal aorta is noted without aneurysm formation.  Severe left renal atrophy is noted.  Mild right hydronephrosis and proximal ureteral dilatation is noted without obstructing calculus. Possible distal ureteral stricture cannot be excluded.  Moderate size fat containing ventral hernia is noted in the pelvis.  Small periumbilical hernia is noted which contains a loop of small bowel, but does not result in obstruction.  Mild distention of gallbladder is noted without cholelithiasis or surrounding inflammation.  Probable distal small bowel obstruction is noted with transition zone seen in the left lower quadrant of the abdomen, which potentially may be due to adhesion.   Electronically Signed   By: Marijo Conception, M.D.   On: 04/22/2015 17:00     EKG Interpretation None      MDM   Final diagnoses:  Abdominal pain  Small bowel obstruction    CT shows distal small bowel obstruction. Has 2 ventral hernias that do not form a transition point I know obstruction via hernia. I discussed the case with Dr. Arnoldo Morale. He is here evaluating the patient. Care also discussed with Dr. Maudie Mercury of [facility] hospitalist. Patient  will be admitted. NG tube has been placed.    Tanna Furry, MD 04/22/15 2111

## 2015-04-22 NOTE — ED Notes (Signed)
Patient called GI specialist, Dr. Delma Officer, in [place] with [facility], and was told to come in. Patient called EMS. Patient complains of abdominal pain with nausea/vomiting with constipation.

## 2015-04-22 NOTE — H&P (Signed)
Kristie Morris is an 81 y.o. female.    Dr. Hawkins (pcp)  Chief Complaint: abdominal pain HPI: 81 yo female with htn, crohns disease, hernia, apparently started to have abdominal pain this am. " sharp:  Straight across the middle of her abdomen", associated with n/v.  Pt denies fever, chills, diarrhea, brbpr, black stool.  Pt will be admitted for possible SBO.  Dr. Jenkins has been consulted by ED, we appreciate his input.   Past Medical History  Diagnosis Date  . Hypertension   . Asthma   . Crohn disease   . COPD (chronic obstructive pulmonary disease)   . Malignant hypertension 02/02/2014  . Diastolic dysfunction 02/02/2014  . Anemia 02/02/2014    Anemia panel WNL  . CAD (coronary artery disease) 2003    Cath with nonobstructive CAD  . Abnormality of gait 01/08/2015  . Heart murmur   . CHF (congestive heart failure)   . Pneumonia 2014; 2015  . History of blood transfusion     "I had 7; related to packing left inside me when I had a D&C; got blood poisoning"  . GERD (gastroesophageal reflux disease)   . Daily headache     "this last few months" (01/08/2015)  . Migraine     "when I was a young woman" (01/08/2015)  . Stroke 03/2014    "left leg weak; lost of strength in my left side; slight tremors; some speach problems since" (01/08/2015)  . Arthritis     "left knee" (01/08/2015)  . Fibromyalgia   . Chronic lower back pain   . Anxiety   . Breast cancer     "I've had it in both breasts"  . Macular hole, both eyes     "both wet and dry"  . OAB (overactive bladder)     Past Surgical History  Procedure Laterality Date  . Mastectomy, partial Right 1999  . Back surgery    . Knee arthroscopy Right 2005  . Abdominal hysterectomy  1954  . Ovarian cyst removal  1946  . Cataract extraction w/ intraocular lens implant Right 2008  . Dilation and curettage of uterus  1953    S/P miscarriage  . Hemorrhoid surgery  1958  . Anterior cervical decomp/discectomy fusion  1971    "used bone off of  my right hip"  . Abdominal adhesion surgery  1993    from colon  . Breast lumpectomy Left 2006  . Patella fracture surgery Left 2008  . Cataract extraction w/ intraocular lens implant Left 2015  . Foot fracture surgery Left 12/2014  . Fracture surgery    . Cardiac catheterization  11/2001    Family History  Problem Relation Age of Onset  . Heart attack Mother   . CAD Mother   . Heart attack Sister   . Cancer - Ovarian Sister   . Cancer - Colon Father    Social History:  reports that she has quit smoking. Her smoking use included Cigarettes. She has a 7.5 pack-year smoking history. She has never used smokeless tobacco. She reports that she does not drink alcohol or use illicit drugs.  Allergies:  Allergies  Allergen Reactions  . Zithromax [Azithromycin] Diarrhea    Pt in hosp for 2 weeks   . Celebrex [Celecoxib] Hives  . Ciprofloxacin Hives  . Metronidazole Hives  . Moxifloxacin Hives  . Penicillins Hives  . Polymyxin B Hives  . Sulfa Antibiotics Hives  . Vancomycin Hives  . Vioxx [Rofecoxib] Hives     (  Not in a hospital admission)  Results for orders placed or performed during the hospital encounter of 04/22/15 (from the past 48 hour(s))  CBC with Differential/Platelet     Status: Abnormal   Collection Time: 04/22/15  2:33 PM  Result Value Ref Range   WBC 18.3 (H) 4.0 - 10.5 K/uL   RBC 4.96 3.87 - 5.11 MIL/uL   Hemoglobin 13.3 12.0 - 15.0 g/dL   HCT 41.3 36.0 - 46.0 %   MCV 83.3 78.0 - 100.0 fL   MCH 26.8 26.0 - 34.0 pg   MCHC 32.2 30.0 - 36.0 g/dL   RDW 15.2 11.5 - 15.5 %   Platelets 391 150 - 400 K/uL   Neutrophils Relative % 83 (H) 43 - 77 %   Neutro Abs 15.2 (H) 1.7 - 7.7 K/uL   Lymphocytes Relative 11 (L) 12 - 46 %   Lymphs Abs 2.0 0.7 - 4.0 K/uL   Monocytes Relative 5 3 - 12 %   Monocytes Absolute 1.0 0.1 - 1.0 K/uL   Eosinophils Relative 1 0 - 5 %   Eosinophils Absolute 0.2 0.0 - 0.7 K/uL   Basophils Relative 0 0 - 1 %   Basophils Absolute 0.0 0.0 -  0.1 K/uL  Comprehensive metabolic panel     Status: Abnormal   Collection Time: 04/22/15  2:33 PM  Result Value Ref Range   Sodium 138 135 - 145 mmol/L   Potassium 4.1 3.5 - 5.1 mmol/L   Chloride 102 101 - 111 mmol/L   CO2 25 22 - 32 mmol/L   Glucose, Bld 115 (H) 65 - 99 mg/dL   BUN 31 (H) 6 - 20 mg/dL   Creatinine, Ser 1.21 (H) 0.44 - 1.00 mg/dL   Calcium 8.7 (L) 8.9 - 10.3 mg/dL   Total Protein 7.1 6.5 - 8.1 g/dL   Albumin 4.0 3.5 - 5.0 g/dL   AST 16 15 - 41 U/L   ALT 18 14 - 54 U/L   Alkaline Phosphatase 165 (H) 38 - 126 U/L   Total Bilirubin 0.7 0.3 - 1.2 mg/dL   GFR calc non Af Amer 39 (L) >60 mL/min   GFR calc Af Amer 45 (L) >60 mL/min    Comment: (NOTE) The eGFR has been calculated using the CKD EPI equation. This calculation has not been validated in all clinical situations. eGFR's persistently <60 mL/min signify possible Chronic Kidney Disease.    Anion gap 11 5 - 15  Lipase, blood     Status: None   Collection Time: 04/22/15  2:33 PM  Result Value Ref Range   Lipase 36 22 - 51 U/L  Urinalysis, Routine w reflex microscopic (not at [facility])     Status: Abnormal   Collection Time: 04/22/15  3:20 PM  Result Value Ref Range   Color, Urine YELLOW YELLOW   APPearance CLEAR CLEAR   Specific Gravity, Urine 1.015 1.005 - 1.030   pH 5.5 5.0 - 8.0   Glucose, UA NEGATIVE NEGATIVE mg/dL   Hgb urine dipstick NEGATIVE NEGATIVE   Bilirubin Urine NEGATIVE NEGATIVE   Ketones, ur NEGATIVE NEGATIVE mg/dL   Protein, ur NEGATIVE NEGATIVE mg/dL   Urobilinogen, UA 0.2 0.0 - 1.0 mg/dL   Nitrite NEGATIVE NEGATIVE   Leukocytes, UA TRACE (A) NEGATIVE  Urine microscopic-add on     Status: Abnormal   Collection Time: 04/22/15  3:20 PM  Result Value Ref Range   Squamous Epithelial / LPF FEW (A) RARE   WBC, UA 21-50 <  3 WBC/hpf   Bacteria, UA MANY (A) RARE   Ct Abdomen Pelvis W Contrast  04/22/2015   CLINICAL DATA:  Generalized abdominal pain.  EXAM: CT ABDOMEN AND PELVIS WITH CONTRAST   TECHNIQUE: Multidetector CT imaging of the abdomen and pelvis was performed using the standard protocol following bolus administration of intravenous contrast.  CONTRAST:  50mL OMNIPAQUE IOHEXOL 300 MG/ML SOLN, 75mL OMNIPAQUE IOHEXOL 300 MG/ML SOLN  COMPARISON:  CT scan of May 24, 2012.  FINDINGS: Multilevel degenerative disc disease is noted in the lower lumbar spine. Visualized lung bases appear normal.  Mild distention of gallbladder is noted without inflammation or cholelithiasis. The liver, spleen and pancreas appear normal. Adrenal glands appear normal. Severe left renal atrophy is noted. Mild right hydronephrosis and proximal ureteral dilatation is noted without obstructing calculus. Distal right ureter appears normal in caliber. Urinary bladder appears normal. Fat containing ventral hernia is noted in the pelvis. Small periumbilical hernia is noted which contains a portion of transverse colon, but does not result in obstruction. Atherosclerosis of abdominal aorta is noted without aneurysm formation.  Sigmoid diverticulosis is noted without inflammation. There appears to be distal small bowel obstruction with transition zone seen in the left lower quadrant of the abdomen anteriorly best seen on image number 55 of series 2. The more distal ileum appears to be normal in caliber. Status post hysterectomy. No significant adenopathy is noted.  IMPRESSION: Sigmoid diverticulosis is noted without inflammation.  Atherosclerosis of abdominal aorta is noted without aneurysm formation.  Severe left renal atrophy is noted.  Mild right hydronephrosis and proximal ureteral dilatation is noted without obstructing calculus. Possible distal ureteral stricture cannot be excluded.  Moderate size fat containing ventral hernia is noted in the pelvis.  Small periumbilical hernia is noted which contains a loop of small bowel, but does not result in obstruction.  Mild distention of gallbladder is noted without cholelithiasis or  surrounding inflammation.  Probable distal small bowel obstruction is noted with transition zone seen in the left lower quadrant of the abdomen, which potentially may be due to adhesion.   Electronically Signed   By:   Green Jr, M.D.   On: 04/22/2015 17:00    ROS  Blood pressure 160/75, pulse 85, temperature 97.8 F (36.6 C), temperature source Oral, resp. rate 22, height 5' 3" (1.6 m), weight 87.544 kg (193 lb), SpO2 97 %. Physical Exam  Constitutional: She is oriented to person, place, and time. She appears well-developed and well-nourished.  HENT:  Head: Normocephalic and atraumatic.  Mouth/Throat: No oropharyngeal exudate.  Eyes: Conjunctivae and EOM are normal. Pupils are equal, round, and reactive to light. No scleral icterus.  Neck: Normal range of motion. Neck supple. No JVD present. No tracheal deviation present. No thyromegaly present.  Cardiovascular: Normal rate and regular rhythm.  Exam reveals no gallop and no friction rub.   No murmur heard. Respiratory: Effort normal and breath sounds normal. No respiratory distress. She has no wheezes. She has no rales.  GI: Bowel sounds are normal. She exhibits distension. She exhibits no mass. There is tenderness. There is no rebound and no guarding.  Midline scar  Musculoskeletal: Normal range of motion. She exhibits no edema or tenderness.  Neurological: She is alert and oriented to person, place, and time. She has normal reflexes. She displays normal reflexes. No cranial nerve deficit. She exhibits normal muscle tone. Coordination normal.  Skin: Skin is warm and dry. No rash noted. No erythema. No pallor.  Psychiatric: She has a   normal mood and affect. Her behavior is normal. Judgment and thought content normal.     Assessment/Plan Abdominal pain secondary ? SBO NPO Ns iv NGT to low intermittent suction Surgery consult, we appreciate input Dilaudid for pain  REnal insufficiency seconadry to dehydration Ns  iv  Uti levaquin iv  Hyperglycemia Check hga1c  DVT prophylaxis: Cy Blamer 04/22/2015, 7:38 PM

## 2015-04-23 ENCOUNTER — Inpatient Hospital Stay (HOSPITAL_COMMUNITY): Payer: Commercial Managed Care - HMO

## 2015-04-23 DIAGNOSIS — R1031 Right lower quadrant pain: Secondary | ICD-10-CM

## 2015-04-23 LAB — COMPREHENSIVE METABOLIC PANEL
ALBUMIN: 3.8 g/dL (ref 3.5–5.0)
ALT: 16 U/L (ref 14–54)
AST: 13 U/L — AB (ref 15–41)
Alkaline Phosphatase: 164 U/L — ABNORMAL HIGH (ref 38–126)
Anion gap: 12 (ref 5–15)
BILIRUBIN TOTAL: 0.6 mg/dL (ref 0.3–1.2)
BUN: 30 mg/dL — ABNORMAL HIGH (ref 6–20)
CALCIUM: 9 mg/dL (ref 8.9–10.3)
CO2: 27 mmol/L (ref 22–32)
CREATININE: 1.2 mg/dL — AB (ref 0.44–1.00)
Chloride: 101 mmol/L (ref 101–111)
GFR calc non Af Amer: 39 mL/min — ABNORMAL LOW (ref >60)
GFR, EST AFRICAN AMERICAN: 45 mL/min — AB (ref >60)
GLUCOSE: 106 mg/dL — AB (ref 65–99)
Potassium: 4.4 mmol/L (ref 3.5–5.1)
Sodium: 140 mmol/L (ref 135–145)
Total Protein: 6.9 g/dL (ref 6.5–8.1)

## 2015-04-23 LAB — CBC
HCT: 42.5 % (ref 36.0–46.0)
Hemoglobin: 13.4 g/dL (ref 12.0–15.0)
MCH: 26.6 pg (ref 26.0–34.0)
MCHC: 31.5 g/dL (ref 30.0–36.0)
MCV: 84.3 fL (ref 78.0–100.0)
Platelets: 366 10*3/uL (ref 150–400)
RBC: 5.04 MIL/uL (ref 3.87–5.11)
RDW: 15.2 % (ref 11.5–15.5)
WBC: 15.5 10*3/uL — ABNORMAL HIGH (ref 4.0–10.5)

## 2015-04-23 MED ORDER — HEPARIN SODIUM (PORCINE) 5000 UNIT/ML IJ SOLN
5000.0000 [IU] | Freq: Three times a day (TID) | INTRAMUSCULAR | Status: DC
  Administered 2015-04-23 – 2015-04-27 (×12): 5000 [IU] via SUBCUTANEOUS
  Filled 2015-04-23 (×11): qty 1

## 2015-04-23 MED ORDER — DEXTROSE 5 % IV SOLN
INTRAVENOUS | Status: AC
  Filled 2015-04-23: qty 10

## 2015-04-23 MED ORDER — DEXTROSE 5 % IV SOLN
1.0000 g | INTRAVENOUS | Status: DC
  Administered 2015-04-24 – 2015-04-26 (×3): 1 g via INTRAVENOUS
  Filled 2015-04-23 (×6): qty 10

## 2015-04-23 MED ORDER — BISACODYL 10 MG RE SUPP
10.0000 mg | Freq: Two times a day (BID) | RECTAL | Status: DC
  Administered 2015-04-23 – 2015-04-25 (×5): 10 mg via RECTAL
  Filled 2015-04-23 (×8): qty 1

## 2015-04-23 MED ORDER — PANTOPRAZOLE SODIUM 40 MG IV SOLR
40.0000 mg | INTRAVENOUS | Status: DC
  Administered 2015-04-23: 40 mg via INTRAVENOUS
  Filled 2015-04-23: qty 40

## 2015-04-23 MED ORDER — CEFTRIAXONE SODIUM IN DEXTROSE 20 MG/ML IV SOLN
1.0000 g | INTRAVENOUS | Status: DC
  Administered 2015-04-23: 1 g via INTRAVENOUS
  Filled 2015-04-23: qty 50

## 2015-04-23 NOTE — Progress Notes (Signed)
Notified MD that patient did not have any IV access due to the fact that she was a very hard stick and had been stuck many times during the night and three times this AM. Patient had a right arm restriction due to a right breast mastectomy over nine years ago. MD ordered that it was ok for patient to have an IV in the right arm. IV was inserted in the right hand. Will continue to monitor patient at this time.

## 2015-04-23 NOTE — Progress Notes (Signed)
[facility] PROGRESS NOTE  Kristie Morris [id number] DOB: April 02, 1925 DOA: 04/22/2015 PCP: Alonza Bogus, MD  Brief narrative 81 year old female with history of Crohn's disease, prior abdominal surgery for adhesions, hypertension, COPD, diastolic CHF, coronary artery disease, history of CHF, and GERD who presented with abdominal pain on the morning of admission associated with nausea and vomiting. CT scan on admission showed partial distal small bowel obstruction. NG placed in the ED and surgery consulted.  Assessment/Plan: Partial small bowel obstruction Likely secondary to adhesions.. Continue nothing by mouth. Continue NG tube with wall suction. Appreciate surgery evaluation. Plan on conservative management with bowel rest, NG and pain control. Supportive care with antiemetics. Follow-up with abdominal x-ray in the morning. Keep potassium >4.   UTI Continue empiric Rocephin.  COPD Stable. Continue dulera  and albuterol nebs when necessary  Coronary artery disease On aspirin which is on hold as patient is nothing by mouth.   DVT prophylaxis: Subcutaneous present  Diet: Nothing by mouth  Code Status: Full code Family Communication: None at bedside Disposition Plan: Currently inpatient  Patient's PCP is Dr. Luan Pulling will resume care from 04/24/2015   Consultants:  Dr. Arnoldo Morale  Procedures:  None  Antibiotics:  IV Rocephin  HPI/Subjective: Patient seen and examined. Reports some mid abdominal pain. Denies nausea. Admission H&P reviewed  Objective: Filed Vitals:   04/23/15 0611  BP: 144/74  Pulse: 74  Temp: 98.5 F (36.9 C)  Resp: 20    Intake/Output Summary (Last 24 hours) at 04/23/15 1024 Last data filed at 04/23/15 0657  Gross per 24 hour  Intake 671.67 ml  Output      0 ml  Net 671.67 ml   Filed Weights   04/22/15 1401 04/22/15 2135 04/23/15 0611  Weight: 87.544 kg (193 lb) 87.2 kg (192 lb 3.9 oz) 88.2 kg (194 lb 7.1 oz)     Exam:   General:  Elderly female lying in bed in no acute distress  HEENT: No pallor, dry oral mucosa, NG in place, supple neck  Chest: Clear to auscultation bilaterally, no added sounds  CVS: Normal S1 and S2, no murmurs rub or gallop  GI: Soft, nondistended, right mid and lower quadrant tenderness, sluggish bowel sounds  musculoskeletal: warm, no edema  CNS: Alert and oriented  Data Reviewed: Basic Metabolic Panel:  Recent Labs Lab 04/22/15 1433 04/23/15 0627  NA 138 140  K 4.1 4.4  CL 102 101  CO2 25 27  GLUCOSE 115* 106*  BUN 31* 30*  CREATININE 1.21* 1.20*  CALCIUM 8.7* 9.0   Liver Function Tests:  Recent Labs Lab 04/22/15 1433 04/23/15 0627  AST 16 13*  ALT 18 16  ALKPHOS 165* 164*  BILITOT 0.7 0.6  PROT 7.1 6.9  ALBUMIN 4.0 3.8    Recent Labs Lab 04/22/15 1433  LIPASE 36   No results for input(s): AMMONIA in the last 168 hours. CBC:  Recent Labs Lab 04/22/15 1433 04/23/15 0627  WBC 18.3* 15.5*  NEUTROABS 15.2*  --   HGB 13.3 13.4  HCT 41.3 42.5  MCV 83.3 84.3  PLT 391 366   Cardiac Enzymes:  Recent Labs Lab 04/22/15 1433  TROPONINI <0.03   BNP (last 3 results)  Recent Labs  01/08/15 2135  BNP 75.9    ProBNP (last 3 results) No results for input(s): PROBNP in the last 8760 hours.  CBG: No results for input(s): GLUCAP in the last 168 hours.  No results found for this or any previous visit (from the  past 240 hour(s)).   Studies: Dg Chest 2 View  04/22/2015   CLINICAL DATA:  Nausea and vomiting dating infant today, hypoxia, history hypertension, asthma, Crohn's disease, COPD, coronary artery disease, CHF, breast cancer  EXAM: CHEST  2 VIEW  COMPARISON:  02/04/2015  FINDINGS: Nasogastric tube extends to inferior mediastinum with proximal side-port at the mid chest; recommend advancing tube 15 cm.  Enlargement of cardiac silhouette.  Atherosclerotic calcification aorta.  Emphysematous and bronchitic changes with  bibasilar atelectasis.  No definite acute infiltrate, pleural effusion or pneumothorax.  Underpenetration at retrocardiac LEFT lower lobe.  Bones demineralized.  IMPRESSION: Tip of nasogastric tube within esophagus; recommend advancing tube 15 cm.  COPD changes with bibasilar atelectasis.  Findings called to [place] in ED on 04/22/2015 at 2025 hours.   Electronically Signed   By: Lavonia Dana M.D.   On: 04/22/2015 20:25   Ct Abdomen Pelvis W Contrast  04/22/2015   CLINICAL DATA:  Generalized abdominal pain.  EXAM: CT ABDOMEN AND PELVIS WITH CONTRAST  TECHNIQUE: Multidetector CT imaging of the abdomen and pelvis was performed using the standard protocol following bolus administration of intravenous contrast.  CONTRAST:  80mL OMNIPAQUE IOHEXOL 300 MG/ML SOLN, 21mL OMNIPAQUE IOHEXOL 300 MG/ML SOLN  COMPARISON:  CT scan of May 24, 2012.  FINDINGS: Multilevel degenerative disc disease is noted in the lower lumbar spine. Visualized lung bases appear normal.  Mild distention of gallbladder is noted without inflammation or cholelithiasis. The liver, spleen and pancreas appear normal. Adrenal glands appear normal. Severe left renal atrophy is noted. Mild right hydronephrosis and proximal ureteral dilatation is noted without obstructing calculus. Distal right ureter appears normal in caliber. Urinary bladder appears normal. Fat containing ventral hernia is noted in the pelvis. Small periumbilical hernia is noted which contains a portion of transverse colon, but does not result in obstruction. Atherosclerosis of abdominal aorta is noted without aneurysm formation.  Sigmoid diverticulosis is noted without inflammation. There appears to be distal small bowel obstruction with transition zone seen in the left lower quadrant of the abdomen anteriorly best seen on image number 55 of series 2. The more distal ileum appears to be normal in caliber. Status post hysterectomy. No significant adenopathy is noted.  IMPRESSION:  Sigmoid diverticulosis is noted without inflammation.  Atherosclerosis of abdominal aorta is noted without aneurysm formation.  Severe left renal atrophy is noted.  Mild right hydronephrosis and proximal ureteral dilatation is noted without obstructing calculus. Possible distal ureteral stricture cannot be excluded.  Moderate size fat containing ventral hernia is noted in the pelvis.  Small periumbilical hernia is noted which contains a loop of small bowel, but does not result in obstruction.  Mild distention of gallbladder is noted without cholelithiasis or surrounding inflammation.  Probable distal small bowel obstruction is noted with transition zone seen in the left lower quadrant of the abdomen, which potentially may be due to adhesion.   Electronically Signed   By: Marijo Conception, M.D.   On: 04/22/2015 17:00   Dg Chest Port 1 View  04/23/2015   CLINICAL DATA:  Follow-up small bowel obstruction  EXAM: PORTABLE CHEST - 1 VIEW  COMPARISON:  PA and lateral chest x-ray of April 22, 2015  FINDINGS: The right lung is well-expanded and clear. On the left there is mild increased density at the lung base which is stable. The cardiac silhouette is top-normal in size but stable. The pulmonary vascularity is normal. There are surgical clips in the axillary regions bilaterally. The  esophagogastric tube tip projects below the GE junction. A KUB film is recommended.  IMPRESSION: Allowing for patient rotation there has not been significant interval change since yesterday's study. The esophagogastric tube tip appears to lie below the GE junction but it is not well demonstrated. A lower abdominal lower chest-upper abdominal film is recommended.   Electronically Signed   By: David  Martinique M.D.   On: 04/23/2015 09:08    Scheduled Meds: . amLODipine  5 mg Oral BID  . aspirin EC  81 mg Oral Daily  . bisacodyl  10 mg Rectal BID  . [START ON 04/24/2015] cefTRIAXone (ROCEPHIN)  IV  1 g Intravenous Q24H  . dicyclomine  20 mg  Oral Daily  . mometasone-formoterol  2 puff Inhalation BID  . sodium chloride  3 mL Intravenous Q12H  . topiramate  25 mg Oral QHS   Continuous Infusions: . sodium chloride 100 mL/hr at 04/23/15 0044    Active Problems:   Abdominal pain   SBO (small bowel obstruction)    Time spent: 25 minutes    Aerianna Losey, [place]  [facility] Pager [phone] If 7PM-7AM, please contact night-coverage at [email or web address], password [facility] 04/23/2015, 10:24 AM  LOS: 1 day

## 2015-04-23 NOTE — Care Management Note (Signed)
Case Management Note  Patient Details  Name: Kristie Morris MRN: [id number] Date of Birth: 1924/11/16  Expected Discharge Date:                  Expected Discharge Plan:  [place]  In-House Referral:  NA  Discharge planning Services  CM Consult  Post Acute Care Choice:  Resumption of Svcs/PTA Provider Choice offered to:  Patient  DME Arranged:    DME Agency:     HH Arranged:  RN, PT, Speech Therapy [place] Agency:  [place]  Status of Service:  In process, will continue to follow  Medicare Important Message Given:    Date Medicare IM Given:    Medicare IM give by:    Date Additional Medicare IM Given:    Additional Medicare Important Message give by:     If discussed at [place] of Stay Meetings, dates discussed:    Additional Comments: Pt is from home with [facility] services. Pt lives with grandson and his wife. Pt admitted for SBO. Pt is active with [facility] for RN, PT, & SLP services. Pt wishes to resume [place] through [facility] at DC. [facility] is aware of admission and will require order to resume [place] at [place]. Romualdo Bolk, of [facility], will obtain pt info from chart. Pt has cane, walker, wheelchair, BSC, Shower chair at home. No new DME needs anticipated. Will cont to follow for CM needs.  Sherald Barge, RN 04/23/2015, 3:40 PM

## 2015-04-23 NOTE — Consult Note (Signed)
Reason for Consult: Small bowel obstruction Referring Physician: Hospitalist  Kristie Morris is an 81 y.o. female.  HPI: Patient is an 81 year old white female with medical problems who presents with a 24-hour history of worsening abdominal distention. She has had some mild lower abdominal cramping and pain. Her last bowel movement was yesterday morning. She does have a history of irritable bowel syndrome, Crohn's disease. CT scan of the abdomen reveals a partial distal small bowel obstruction. Patient had an NG tube placed which has provided only mild relief. She states she does feel better.  Past Medical History  Diagnosis Date  . Hypertension   . Asthma   . Crohn disease   . COPD (chronic obstructive pulmonary disease)   . Malignant hypertension 02/02/2014  . Diastolic dysfunction 12/04/5174  . Anemia 02/02/2014    Anemia panel WNL  . CAD (coronary artery disease) 2003    Cath with nonobstructive CAD  . Abnormality of gait 01/08/2015  . Heart murmur   . CHF (congestive heart failure)   . Pneumonia 2014; 2015  . History of blood transfusion     "I had 7; related to packing left inside me when I had a D&C; got blood poisoning"  . GERD (gastroesophageal reflux disease)   . Daily headache     "this last few months" (01/08/2015)  . Migraine     "when I was a young woman" (01/08/2015)  . Stroke 03/2014    "left leg weak; lost of strength in my left side; slight tremors; some speach problems since" (01/08/2015)  . Arthritis     "left knee" (01/08/2015)  . Fibromyalgia   . Chronic lower back pain   . Anxiety   . Breast cancer     "I've had it in both breasts"  . Macular hole, both eyes     "both wet and dry"  . OAB (overactive bladder)     Past Surgical History  Procedure Laterality Date  . Mastectomy, partial Right 1999  . Back surgery    . Knee arthroscopy Right 2005  . Abdominal hysterectomy  1954  . Ovarian cyst removal  1946  . Cataract extraction w/ intraocular lens implant Right  2008  . Dilation and curettage of uterus  1953    S/P miscarriage  . Hemorrhoid surgery  1958  . Anterior cervical decomp/discectomy fusion  1971    "used bone off of my right hip"  . Abdominal adhesion surgery  1993    from colon  . Breast lumpectomy Left 2006  . Patella fracture surgery Left 2008  . Cataract extraction w/ intraocular lens implant Left 2015  . Foot fracture surgery Left 12/2014  . Fracture surgery    . Cardiac catheterization  11/2001    Family History  Problem Relation Age of Onset  . Heart attack Mother   . CAD Mother   . Heart attack Sister   . Cancer - Ovarian Sister   . Cancer - Colon Father     Social History:  reports that she has quit smoking. Her smoking use included Cigarettes. She has a 7.5 pack-year smoking history. She has never used smokeless tobacco. She reports that she does not drink alcohol or use illicit drugs.  Allergies:  Allergies  Allergen Reactions  . Zithromax [Azithromycin] Diarrhea    Pt in hosp for 2 weeks   . Celebrex [Celecoxib] Hives  . Ciprofloxacin Hives  . Metronidazole Hives  . Moxifloxacin Hives  . Penicillins Hives  . Polymyxin  B Hives  . Sulfa Antibiotics Hives  . Vancomycin Hives  . Vioxx [Rofecoxib] Hives    Medications: I have reviewed the patient's current medications.  Results for orders placed or performed during the hospital encounter of 04/22/15 (from the past 48 hour(s))  CBC with Differential/Platelet     Status: Abnormal   Collection Time: 04/22/15  2:33 PM  Result Value Ref Range   WBC 18.3 (H) 4.0 - 10.5 K/uL   RBC 4.96 3.87 - 5.11 MIL/uL   Hemoglobin 13.3 12.0 - 15.0 g/dL   HCT 41.3 36.0 - 46.0 %   MCV 83.3 78.0 - 100.0 fL   MCH 26.8 26.0 - 34.0 pg   MCHC 32.2 30.0 - 36.0 g/dL   RDW 15.2 11.5 - 15.5 %   Platelets 391 150 - 400 K/uL   Neutrophils Relative % 83 (H) 43 - 77 %   Neutro Abs 15.2 (H) 1.7 - 7.7 K/uL   Lymphocytes Relative 11 (L) 12 - 46 %   Lymphs Abs 2.0 0.7 - 4.0 K/uL    Monocytes Relative 5 3 - 12 %   Monocytes Absolute 1.0 0.1 - 1.0 K/uL   Eosinophils Relative 1 0 - 5 %   Eosinophils Absolute 0.2 0.0 - 0.7 K/uL   Basophils Relative 0 0 - 1 %   Basophils Absolute 0.0 0.0 - 0.1 K/uL  Comprehensive metabolic panel     Status: Abnormal   Collection Time: 04/22/15  2:33 PM  Result Value Ref Range   Sodium 138 135 - 145 mmol/L   Potassium 4.1 3.5 - 5.1 mmol/L   Chloride 102 101 - 111 mmol/L   CO2 25 22 - 32 mmol/L   Glucose, Bld 115 (H) 65 - 99 mg/dL   BUN 31 (H) 6 - 20 mg/dL   Creatinine, Ser 1.21 (H) 0.44 - 1.00 mg/dL   Calcium 8.7 (L) 8.9 - 10.3 mg/dL   Total Protein 7.1 6.5 - 8.1 g/dL   Albumin 4.0 3.5 - 5.0 g/dL   AST 16 15 - 41 U/L   ALT 18 14 - 54 U/L   Alkaline Phosphatase 165 (H) 38 - 126 U/L   Total Bilirubin 0.7 0.3 - 1.2 mg/dL   GFR calc non Af Amer 39 (L) >60 mL/min   GFR calc Af Amer 45 (L) >60 mL/min    Comment: (NOTE) The eGFR has been calculated using the CKD EPI equation. This calculation has not been validated in all clinical situations. eGFR's persistently <60 mL/min signify possible Chronic Kidney Disease.    Anion gap 11 5 - 15  Lipase, blood     Status: None   Collection Time: 04/22/15  2:33 PM  Result Value Ref Range   Lipase 36 22 - 51 U/L  Troponin I     Status: None   Collection Time: 04/22/15  2:33 PM  Result Value Ref Range   Troponin I <0.03 <0.031 ng/mL    Comment:        NO INDICATION OF MYOCARDIAL INJURY.   Urinalysis, Routine w reflex microscopic (not at [facility])     Status: Abnormal   Collection Time: 04/22/15  3:20 PM  Result Value Ref Range   Color, Urine YELLOW YELLOW   APPearance CLEAR CLEAR   Specific Gravity, Urine 1.015 1.005 - 1.030   pH 5.5 5.0 - 8.0   Glucose, UA NEGATIVE NEGATIVE mg/dL   Hgb urine dipstick NEGATIVE NEGATIVE   Bilirubin Urine NEGATIVE NEGATIVE   Ketones, ur  NEGATIVE NEGATIVE mg/dL   Protein, ur NEGATIVE NEGATIVE mg/dL   Urobilinogen, UA 0.2 0.0 - 1.0 mg/dL   Nitrite  NEGATIVE NEGATIVE   Leukocytes, UA TRACE (A) NEGATIVE  Urine microscopic-add on     Status: Abnormal   Collection Time: 04/22/15  3:20 PM  Result Value Ref Range   Squamous Epithelial / LPF FEW (A) RARE   WBC, UA 21-50 <3 WBC/hpf   Bacteria, UA MANY (A) RARE  Comprehensive metabolic panel     Status: Abnormal   Collection Time: 04/23/15  6:27 AM  Result Value Ref Range   Sodium 140 135 - 145 mmol/L   Potassium 4.4 3.5 - 5.1 mmol/L   Chloride 101 101 - 111 mmol/L   CO2 27 22 - 32 mmol/L   Glucose, Bld 106 (H) 65 - 99 mg/dL   BUN 30 (H) 6 - 20 mg/dL   Creatinine, Ser 1.20 (H) 0.44 - 1.00 mg/dL   Calcium 9.0 8.9 - 10.3 mg/dL   Total Protein 6.9 6.5 - 8.1 g/dL   Albumin 3.8 3.5 - 5.0 g/dL   AST 13 (L) 15 - 41 U/L   ALT 16 14 - 54 U/L   Alkaline Phosphatase 164 (H) 38 - 126 U/L   Total Bilirubin 0.6 0.3 - 1.2 mg/dL   GFR calc non Af Amer 39 (L) >60 mL/min   GFR calc Af Amer 45 (L) >60 mL/min    Comment: (NOTE) The eGFR has been calculated using the CKD EPI equation. This calculation has not been validated in all clinical situations. eGFR's persistently <60 mL/min signify possible Chronic Kidney Disease.    Anion gap 12 5 - 15  CBC     Status: Abnormal   Collection Time: 04/23/15  6:27 AM  Result Value Ref Range   WBC 15.5 (H) 4.0 - 10.5 K/uL   RBC 5.04 3.87 - 5.11 MIL/uL   Hemoglobin 13.4 12.0 - 15.0 g/dL   HCT 42.5 36.0 - 46.0 %   MCV 84.3 78.0 - 100.0 fL   MCH 26.6 26.0 - 34.0 pg   MCHC 31.5 30.0 - 36.0 g/dL   RDW 15.2 11.5 - 15.5 %   Platelets 366 150 - 400 K/uL    Dg Chest 2 View  04/22/2015   CLINICAL DATA:  Nausea and vomiting dating infant today, hypoxia, history hypertension, asthma, Crohn's disease, COPD, coronary artery disease, CHF, breast cancer  EXAM: CHEST  2 VIEW  COMPARISON:  02/04/2015  FINDINGS: Nasogastric tube extends to inferior mediastinum with proximal side-port at the mid chest; recommend advancing tube 15 cm.  Enlargement of cardiac silhouette.   Atherosclerotic calcification aorta.  Emphysematous and bronchitic changes with bibasilar atelectasis.  No definite acute infiltrate, pleural effusion or pneumothorax.  Underpenetration at retrocardiac LEFT lower lobe.  Bones demineralized.  IMPRESSION: Tip of nasogastric tube within esophagus; recommend advancing tube 15 cm.  COPD changes with bibasilar atelectasis.  Findings called to [place] in ED on 04/22/2015 at 2025 hours.   Electronically Signed   By: Lavonia Dana M.D.   On: 04/22/2015 20:25   Ct Abdomen Pelvis W Contrast  04/22/2015   CLINICAL DATA:  Generalized abdominal pain.  EXAM: CT ABDOMEN AND PELVIS WITH CONTRAST  TECHNIQUE: Multidetector CT imaging of the abdomen and pelvis was performed using the standard protocol following bolus administration of intravenous contrast.  CONTRAST:  90m OMNIPAQUE IOHEXOL 300 MG/ML SOLN, 727mOMNIPAQUE IOHEXOL 300 MG/ML SOLN  COMPARISON:  CT scan of May 24, 2012.  FINDINGS: Multilevel degenerative disc disease is noted in the lower lumbar spine. Visualized lung bases appear normal.  Mild distention of gallbladder is noted without inflammation or cholelithiasis. The liver, spleen and pancreas appear normal. Adrenal glands appear normal. Severe left renal atrophy is noted. Mild right hydronephrosis and proximal ureteral dilatation is noted without obstructing calculus. Distal right ureter appears normal in caliber. Urinary bladder appears normal. Fat containing ventral hernia is noted in the pelvis. Small periumbilical hernia is noted which contains a portion of transverse colon, but does not result in obstruction. Atherosclerosis of abdominal aorta is noted without aneurysm formation.  Sigmoid diverticulosis is noted without inflammation. There appears to be distal small bowel obstruction with transition zone seen in the left lower quadrant of the abdomen anteriorly best seen on image number 55 of series 2. The more distal ileum appears to be normal in caliber.  Status post hysterectomy. No significant adenopathy is noted.  IMPRESSION: Sigmoid diverticulosis is noted without inflammation.  Atherosclerosis of abdominal aorta is noted without aneurysm formation.  Severe left renal atrophy is noted.  Mild right hydronephrosis and proximal ureteral dilatation is noted without obstructing calculus. Possible distal ureteral stricture cannot be excluded.  Moderate size fat containing ventral hernia is noted in the pelvis.  Small periumbilical hernia is noted which contains a loop of small bowel, but does not result in obstruction.  Mild distention of gallbladder is noted without cholelithiasis or surrounding inflammation.  Probable distal small bowel obstruction is noted with transition zone seen in the left lower quadrant of the abdomen, which potentially may be due to adhesion.   Electronically Signed   By: Marijo Conception, M.D.   On: 04/22/2015 17:00    ROS: See chart Blood pressure 144/74, pulse 74, temperature 98.5 F (36.9 C), temperature source Oral, resp. rate 20, height 5' 3"  (1.6 m), weight 88.2 kg (194 lb 7.1 oz), SpO2 97 %. Physical Exam: Pleasant white female in no acute distress. Abdomen is soft but distended. I do appreciate bowel sounds. NG tube has had minimal output. No rigidity is noted.  Assessment/Plan: Impression: Partial small bowel obstruction most likely secondary to adhesive disease, history of irritable bowel disease and Crohn's disease with constipation, diverticulosis, previous abdominal surgery Plan: We'll get a chest x-ray to confirm NG tube placement. No need for acute surgical event should this time. Patient would like to avoid surgery if at all possible. Will follow with you.  Cleatus Gabriel A 04/23/2015, 8:29 AM

## 2015-04-24 ENCOUNTER — Inpatient Hospital Stay (HOSPITAL_COMMUNITY): Payer: Commercial Managed Care - HMO

## 2015-04-24 LAB — BASIC METABOLIC PANEL
ANION GAP: 10 (ref 5–15)
BUN: 35 mg/dL — ABNORMAL HIGH (ref 6–20)
CO2: 27 mmol/L (ref 22–32)
Calcium: 8.7 mg/dL — ABNORMAL LOW (ref 8.9–10.3)
Chloride: 103 mmol/L (ref 101–111)
Creatinine, Ser: 1.16 mg/dL — ABNORMAL HIGH (ref 0.44–1.00)
GFR calc Af Amer: 47 mL/min — ABNORMAL LOW (ref >60)
GFR, EST NON AFRICAN AMERICAN: 41 mL/min — AB (ref >60)
Glucose, Bld: 118 mg/dL — ABNORMAL HIGH (ref 65–99)
Potassium: 4.1 mmol/L (ref 3.5–5.1)
SODIUM: 140 mmol/L (ref 135–145)

## 2015-04-24 MED ORDER — ACETAMINOPHEN 325 MG PO TABS
650.0000 mg | ORAL_TABLET | ORAL | Status: DC | PRN
  Administered 2015-04-24 – 2015-04-26 (×5): 650 mg via ORAL
  Filled 2015-04-24 (×5): qty 2

## 2015-04-24 MED ORDER — PANTOPRAZOLE SODIUM 40 MG PO TBEC
40.0000 mg | DELAYED_RELEASE_TABLET | Freq: Every day | ORAL | Status: DC
  Administered 2015-04-24 – 2015-04-27 (×4): 40 mg via ORAL
  Filled 2015-04-24 (×4): qty 1

## 2015-04-24 NOTE — Progress Notes (Signed)
Subjective: Patient had a large bowel movement yesterday evening. Still has some abdominal pain, but much improved.  Objective: Vital signs in last 24 hours: Temp:  [98.6 F (37 C)-98.7 F (37.1 C)] 98.7 F (37.1 C) (07/20 0640) Pulse Rate:  [79-95] 80 (07/20 0640) Resp:  [20] 20 (07/20 0640) BP: (127-142)/(68-73) 142/73 mmHg (07/20 0640) SpO2:  [94 %-97 %] 94 % (07/20 0735) Weight:  [87.9 kg (193 lb 12.6 oz)] 87.9 kg (193 lb 12.6 oz) (07/20 0640) Last BM Date: 04/21/15  Intake/Output from previous day: 07/19 0701 - 07/20 0700 In: 772.5 [I.V.:772.5] Out: 100 [Emesis/NG output:100] Intake/Output this shift:    General appearance: alert, cooperative and no distress GI: Soft, nondistended. Minimal nonspecific tenderness noted. No rigidity is noted. Active bowel sounds appreciated.  Lab Results:   Recent Labs  04/22/15 1433 04/23/15 0627  WBC 18.3* 15.5*  HGB 13.3 13.4  HCT 41.3 42.5  PLT 391 366   BMET  Recent Labs  04/23/15 0627 04/24/15 0524  NA 140 140  K 4.4 4.1  CL 101 103  CO2 27 27  GLUCOSE 106* 118*  BUN 30* 35*  CREATININE 1.20* 1.16*  CALCIUM 9.0 8.7*   PT/INR No results for input(s): LABPROT, INR in the last 72 hours.  Studies/Results: Dg Chest 2 View  04/22/2015   CLINICAL DATA:  Nausea and vomiting dating infant today, hypoxia, history hypertension, asthma, Crohn's disease, COPD, coronary artery disease, CHF, breast cancer  EXAM: CHEST  2 VIEW  COMPARISON:  02/04/2015  FINDINGS: Nasogastric tube extends to inferior mediastinum with proximal side-port at the mid chest; recommend advancing tube 15 cm.  Enlargement of cardiac silhouette.  Atherosclerotic calcification aorta.  Emphysematous and bronchitic changes with bibasilar atelectasis.  No definite acute infiltrate, pleural effusion or pneumothorax.  Underpenetration at retrocardiac LEFT lower lobe.  Bones demineralized.  IMPRESSION: Tip of nasogastric tube within esophagus; recommend advancing  tube 15 cm.  COPD changes with bibasilar atelectasis.  Findings called to [place] in ED on 04/22/2015 at 2025 hours.   Electronically Signed   By: Lavonia Dana M.D.   On: 04/22/2015 20:25   Ct Abdomen Pelvis W Contrast  04/22/2015   CLINICAL DATA:  Generalized abdominal pain.  EXAM: CT ABDOMEN AND PELVIS WITH CONTRAST  TECHNIQUE: Multidetector CT imaging of the abdomen and pelvis was performed using the standard protocol following bolus administration of intravenous contrast.  CONTRAST:  47mL OMNIPAQUE IOHEXOL 300 MG/ML SOLN, 58mL OMNIPAQUE IOHEXOL 300 MG/ML SOLN  COMPARISON:  CT scan of May 24, 2012.  FINDINGS: Multilevel degenerative disc disease is noted in the lower lumbar spine. Visualized lung bases appear normal.  Mild distention of gallbladder is noted without inflammation or cholelithiasis. The liver, spleen and pancreas appear normal. Adrenal glands appear normal. Severe left renal atrophy is noted. Mild right hydronephrosis and proximal ureteral dilatation is noted without obstructing calculus. Distal right ureter appears normal in caliber. Urinary bladder appears normal. Fat containing ventral hernia is noted in the pelvis. Small periumbilical hernia is noted which contains a portion of transverse colon, but does not result in obstruction. Atherosclerosis of abdominal aorta is noted without aneurysm formation.  Sigmoid diverticulosis is noted without inflammation. There appears to be distal small bowel obstruction with transition zone seen in the left lower quadrant of the abdomen anteriorly best seen on image number 55 of series 2. The more distal ileum appears to be normal in caliber. Status post hysterectomy. No significant adenopathy is noted.  IMPRESSION: Sigmoid diverticulosis  is noted without inflammation.  Atherosclerosis of abdominal aorta is noted without aneurysm formation.  Severe left renal atrophy is noted.  Mild right hydronephrosis and proximal ureteral dilatation is noted without  obstructing calculus. Possible distal ureteral stricture cannot be excluded.  Moderate size fat containing ventral hernia is noted in the pelvis.  Small periumbilical hernia is noted which contains a loop of small bowel, but does not result in obstruction.  Mild distention of gallbladder is noted without cholelithiasis or surrounding inflammation.  Probable distal small bowel obstruction is noted with transition zone seen in the left lower quadrant of the abdomen, which potentially may be due to adhesion.   Electronically Signed   By: Marijo Conception, M.D.   On: 04/22/2015 17:00   Dg Chest Port 1 View  04/23/2015   CLINICAL DATA:  Follow-up small bowel obstruction  EXAM: PORTABLE CHEST - 1 VIEW  COMPARISON:  PA and lateral chest x-ray of April 22, 2015  FINDINGS: The right lung is well-expanded and clear. On the left there is mild increased density at the lung base which is stable. The cardiac silhouette is top-normal in size but stable. The pulmonary vascularity is normal. There are surgical clips in the axillary regions bilaterally. The esophagogastric tube tip projects below the GE junction. A KUB film is recommended.  IMPRESSION: Allowing for patient rotation there has not been significant interval change since yesterday's study. The esophagogastric tube tip appears to lie below the GE junction but it is not well demonstrated. A lower abdominal lower chest-upper abdominal film is recommended.   Electronically Signed   By: David  Martinique M.D.   On: 04/23/2015 09:08   Dg Abd Portable 1v  04/23/2015   CLINICAL DATA:  Nasogastric tube placement  EXAM: PORTABLE ABDOMEN - 1 VIEW  COMPARISON:  Portable exam 1228 hours compared to CT abdomen and pelvis 04/22/2015  FINDINGS: Nasogastric tube coiled in proximal stomach with tip projecting over proximal antrum.  Dilated small bowel loops consistent with paucity of gas within the colon compatible with small bowel obstruction.  No definite bowel wall thickening.   Retained contrast within a few dilated small bowel loops.  Bones demineralized.  Question mild levoconvex scoliosis.  IMPRESSION: Small bowel obstruction.  Nasogastric tube coiled in proximal stomach with tip projecting over proximal antrum.   Electronically Signed   By: Lavonia Dana M.D.   On: 04/23/2015 12:45    Anti-infectives: Anti-infectives    Start     Dose/Rate Route Frequency Ordered Stop   04/24/15 0600  cefTRIAXone (ROCEPHIN) 1 g in dextrose 5 % 50 mL IVPB     1 g 100 mL/hr over 30 Minutes Intravenous Every 24 hours 04/23/15 0759     04/23/15 0545  cefTRIAXone (ROCEPHIN) 1 g in dextrose 5 % 50 mL IVPB - Premix  Status:  Discontinued     1 g 100 mL/hr over 30 Minutes Intravenous Every 24 hours 04/23/15 0537 04/23/15 0800      Assessment/Plan: Impression: Small bowel obstruction, resolving Plan: NG tube has been removed. We'll start clear liquid diet. Advance as tolerated. Have encouraged patient to the sitting in chair and ambulating with assistance.  LOS: 2 days    Charmeka Freeburg A 04/24/2015

## 2015-04-24 NOTE — Progress Notes (Signed)
Subjective: She says she feels much better. She started having bowel movements early this morning. Her abdominal pain is better. She is eating some Jell-O.  Objective: Vital signs in last 24 hours: Temp:  [98.6 F (37 C)-98.7 F (37.1 C)] 98.7 F (37.1 C) (07/20 0640) Pulse Rate:  [79-95] 80 (07/20 0640) Resp:  [20] 20 (07/20 0640) BP: (127-142)/(68-73) 142/73 mmHg (07/20 0640) SpO2:  [94 %-97 %] 94 % (07/20 0735) Weight:  [87.9 kg (193 lb 12.6 oz)] 87.9 kg (193 lb 12.6 oz) (07/20 0640) Weight change: 0.356 kg (12.6 oz) Last BM Date: 04/21/15  Intake/Output from previous day: 07/19 0701 - 07/20 0700 In: 772.5 [I.V.:772.5] Out: 100 [Emesis/NG output:100]  PHYSICAL EXAM General appearance: alert, cooperative and mild distress Resp: clear to auscultation bilaterally Cardio: regular rate and rhythm, S1, S2 normal, no murmur, click, rub or gallop GI: Minimally diffusely tender with active bowel sounds Extremities: extremities normal, atraumatic, no cyanosis or edema  Lab Results:  Results for orders placed or performed during the hospital encounter of 04/22/15 (from the past 48 hour(s))  CBC with Differential/Platelet     Status: Abnormal   Collection Time: 04/22/15  2:33 PM  Result Value Ref Range   WBC 18.3 (H) 4.0 - 10.5 K/uL   RBC 4.96 3.87 - 5.11 MIL/uL   Hemoglobin 13.3 12.0 - 15.0 g/dL   HCT 41.3 36.0 - 46.0 %   MCV 83.3 78.0 - 100.0 fL   MCH 26.8 26.0 - 34.0 pg   MCHC 32.2 30.0 - 36.0 g/dL   RDW 15.2 11.5 - 15.5 %   Platelets 391 150 - 400 K/uL   Neutrophils Relative % 83 (H) 43 - 77 %   Neutro Abs 15.2 (H) 1.7 - 7.7 K/uL   Lymphocytes Relative 11 (L) 12 - 46 %   Lymphs Abs 2.0 0.7 - 4.0 K/uL   Monocytes Relative 5 3 - 12 %   Monocytes Absolute 1.0 0.1 - 1.0 K/uL   Eosinophils Relative 1 0 - 5 %   Eosinophils Absolute 0.2 0.0 - 0.7 K/uL   Basophils Relative 0 0 - 1 %   Basophils Absolute 0.0 0.0 - 0.1 K/uL  Comprehensive metabolic panel     Status: Abnormal    Collection Time: 04/22/15  2:33 PM  Result Value Ref Range   Sodium 138 135 - 145 mmol/L   Potassium 4.1 3.5 - 5.1 mmol/L   Chloride 102 101 - 111 mmol/L   CO2 25 22 - 32 mmol/L   Glucose, Bld 115 (H) 65 - 99 mg/dL   BUN 31 (H) 6 - 20 mg/dL   Creatinine, Ser 1.21 (H) 0.44 - 1.00 mg/dL   Calcium 8.7 (L) 8.9 - 10.3 mg/dL   Total Protein 7.1 6.5 - 8.1 g/dL   Albumin 4.0 3.5 - 5.0 g/dL   AST 16 15 - 41 U/L   ALT 18 14 - 54 U/L   Alkaline Phosphatase 165 (H) 38 - 126 U/L   Total Bilirubin 0.7 0.3 - 1.2 mg/dL   GFR calc non Af Amer 39 (L) >60 mL/min   GFR calc Af Amer 45 (L) >60 mL/min    Comment: (NOTE) The eGFR has been calculated using the CKD EPI equation. This calculation has not been validated in all clinical situations. eGFR's persistently <60 mL/min signify possible Chronic Kidney Disease.    Anion gap 11 5 - 15  Lipase, blood     Status: None   Collection Time: 04/22/15  2:33 PM  Result Value Ref Range   Lipase 36 22 - 51 U/L  Troponin I     Status: None   Collection Time: 04/22/15  2:33 PM  Result Value Ref Range   Troponin I <0.03 <0.031 ng/mL    Comment:        NO INDICATION OF MYOCARDIAL INJURY.   Urinalysis, Routine w reflex microscopic (not at [facility])     Status: Abnormal   Collection Time: 04/22/15  3:20 PM  Result Value Ref Range   Color, Urine YELLOW YELLOW   APPearance CLEAR CLEAR   Specific Gravity, Urine 1.015 1.005 - 1.030   pH 5.5 5.0 - 8.0   Glucose, UA NEGATIVE NEGATIVE mg/dL   Hgb urine dipstick NEGATIVE NEGATIVE   Bilirubin Urine NEGATIVE NEGATIVE   Ketones, ur NEGATIVE NEGATIVE mg/dL   Protein, ur NEGATIVE NEGATIVE mg/dL   Urobilinogen, UA 0.2 0.0 - 1.0 mg/dL   Nitrite NEGATIVE NEGATIVE   Leukocytes, UA TRACE (A) NEGATIVE  Urine microscopic-add on     Status: Abnormal   Collection Time: 04/22/15  3:20 PM  Result Value Ref Range   Squamous Epithelial / LPF FEW (A) RARE   WBC, UA 21-50 <3 WBC/hpf   Bacteria, UA MANY (A) RARE  Comprehensive  metabolic panel     Status: Abnormal   Collection Time: 04/23/15  6:27 AM  Result Value Ref Range   Sodium 140 135 - 145 mmol/L   Potassium 4.4 3.5 - 5.1 mmol/L   Chloride 101 101 - 111 mmol/L   CO2 27 22 - 32 mmol/L   Glucose, Bld 106 (H) 65 - 99 mg/dL   BUN 30 (H) 6 - 20 mg/dL   Creatinine, Ser 1.20 (H) 0.44 - 1.00 mg/dL   Calcium 9.0 8.9 - 10.3 mg/dL   Total Protein 6.9 6.5 - 8.1 g/dL   Albumin 3.8 3.5 - 5.0 g/dL   AST 13 (L) 15 - 41 U/L   ALT 16 14 - 54 U/L   Alkaline Phosphatase 164 (H) 38 - 126 U/L   Total Bilirubin 0.6 0.3 - 1.2 mg/dL   GFR calc non Af Amer 39 (L) >60 mL/min   GFR calc Af Amer 45 (L) >60 mL/min    Comment: (NOTE) The eGFR has been calculated using the CKD EPI equation. This calculation has not been validated in all clinical situations. eGFR's persistently <60 mL/min signify possible Chronic Kidney Disease.    Anion gap 12 5 - 15  CBC     Status: Abnormal   Collection Time: 04/23/15  6:27 AM  Result Value Ref Range   WBC 15.5 (H) 4.0 - 10.5 K/uL   RBC 5.04 3.87 - 5.11 MIL/uL   Hemoglobin 13.4 12.0 - 15.0 g/dL   HCT 42.5 36.0 - 46.0 %   MCV 84.3 78.0 - 100.0 fL   MCH 26.6 26.0 - 34.0 pg   MCHC 31.5 30.0 - 36.0 g/dL   RDW 15.2 11.5 - 15.5 %   Platelets 366 150 - 400 K/uL  Basic metabolic panel     Status: Abnormal   Collection Time: 04/24/15  5:24 AM  Result Value Ref Range   Sodium 140 135 - 145 mmol/L   Potassium 4.1 3.5 - 5.1 mmol/L   Chloride 103 101 - 111 mmol/L   CO2 27 22 - 32 mmol/L   Glucose, Bld 118 (H) 65 - 99 mg/dL   BUN 35 (H) 6 - 20 mg/dL   Creatinine, Ser 1.16 (  H) 0.44 - 1.00 mg/dL   Calcium 8.7 (L) 8.9 - 10.3 mg/dL   GFR calc non Af Amer 41 (L) >60 mL/min   GFR calc Af Amer 47 (L) >60 mL/min    Comment: (NOTE) The eGFR has been calculated using the CKD EPI equation. This calculation has not been validated in all clinical situations. eGFR's persistently <60 mL/min signify possible Chronic Kidney Disease.    Anion gap 10 5 -  15    ABGS No results for input(s): PHART, PO2ART, TCO2, HCO3 in the last 72 hours.  Invalid input(s): PCO2 CULTURES No results found for this or any previous visit (from the past 240 hour(s)). Studies/Results: Dg Chest 2 View  04/22/2015   CLINICAL DATA:  Nausea and vomiting dating infant today, hypoxia, history hypertension, asthma, Crohn's disease, COPD, coronary artery disease, CHF, breast cancer  EXAM: CHEST  2 VIEW  COMPARISON:  02/04/2015  FINDINGS: Nasogastric tube extends to inferior mediastinum with proximal side-port at the mid chest; recommend advancing tube 15 cm.  Enlargement of cardiac silhouette.  Atherosclerotic calcification aorta.  Emphysematous and bronchitic changes with bibasilar atelectasis.  No definite acute infiltrate, pleural effusion or pneumothorax.  Underpenetration at retrocardiac LEFT lower lobe.  Bones demineralized.  IMPRESSION: Tip of nasogastric tube within esophagus; recommend advancing tube 15 cm.  COPD changes with bibasilar atelectasis.  Findings called to [place] in ED on 04/22/2015 at 2025 hours.   Electronically Signed   By: Lavonia Dana M.D.   On: 04/22/2015 20:25   Ct Abdomen Pelvis W Contrast  04/22/2015   CLINICAL DATA:  Generalized abdominal pain.  EXAM: CT ABDOMEN AND PELVIS WITH CONTRAST  TECHNIQUE: Multidetector CT imaging of the abdomen and pelvis was performed using the standard protocol following bolus administration of intravenous contrast.  CONTRAST:  27m OMNIPAQUE IOHEXOL 300 MG/ML SOLN, 752mOMNIPAQUE IOHEXOL 300 MG/ML SOLN  COMPARISON:  CT scan of May 24, 2012.  FINDINGS: Multilevel degenerative disc disease is noted in the lower lumbar spine. Visualized lung bases appear normal.  Mild distention of gallbladder is noted without inflammation or cholelithiasis. The liver, spleen and pancreas appear normal. Adrenal glands appear normal. Severe left renal atrophy is noted. Mild right hydronephrosis and proximal ureteral dilatation is noted  without obstructing calculus. Distal right ureter appears normal in caliber. Urinary bladder appears normal. Fat containing ventral hernia is noted in the pelvis. Small periumbilical hernia is noted which contains a portion of transverse colon, but does not result in obstruction. Atherosclerosis of abdominal aorta is noted without aneurysm formation.  Sigmoid diverticulosis is noted without inflammation. There appears to be distal small bowel obstruction with transition zone seen in the left lower quadrant of the abdomen anteriorly best seen on image number 55 of series 2. The more distal ileum appears to be normal in caliber. Status post hysterectomy. No significant adenopathy is noted.  IMPRESSION: Sigmoid diverticulosis is noted without inflammation.  Atherosclerosis of abdominal aorta is noted without aneurysm formation.  Severe left renal atrophy is noted.  Mild right hydronephrosis and proximal ureteral dilatation is noted without obstructing calculus. Possible distal ureteral stricture cannot be excluded.  Moderate size fat containing ventral hernia is noted in the pelvis.  Small periumbilical hernia is noted which contains a loop of small bowel, but does not result in obstruction.  Mild distention of gallbladder is noted without cholelithiasis or surrounding inflammation.  Probable distal small bowel obstruction is noted with transition zone seen in the left lower quadrant of the abdomen,  which potentially may be due to adhesion.   Electronically Signed   By: Marijo Conception, M.D.   On: 04/22/2015 17:00   Dg Chest Port 1 View  04/23/2015   CLINICAL DATA:  Follow-up small bowel obstruction  EXAM: PORTABLE CHEST - 1 VIEW  COMPARISON:  PA and lateral chest x-ray of April 22, 2015  FINDINGS: The right lung is well-expanded and clear. On the left there is mild increased density at the lung base which is stable. The cardiac silhouette is top-normal in size but stable. The pulmonary vascularity is normal. There  are surgical clips in the axillary regions bilaterally. The esophagogastric tube tip projects below the GE junction. A KUB film is recommended.  IMPRESSION: Allowing for patient rotation there has not been significant interval change since yesterday's study. The esophagogastric tube tip appears to lie below the GE junction but it is not well demonstrated. A lower abdominal lower chest-upper abdominal film is recommended.   Electronically Signed   By: David  Martinique M.D.   On: 04/23/2015 09:08   Dg Abd Portable 1v  04/23/2015   CLINICAL DATA:  Nasogastric tube placement  EXAM: PORTABLE ABDOMEN - 1 VIEW  COMPARISON:  Portable exam 1228 hours compared to CT abdomen and pelvis 04/22/2015  FINDINGS: Nasogastric tube coiled in proximal stomach with tip projecting over proximal antrum.  Dilated small bowel loops consistent with paucity of gas within the colon compatible with small bowel obstruction.  No definite bowel wall thickening.  Retained contrast within a few dilated small bowel loops.  Bones demineralized.  Question mild levoconvex scoliosis.  IMPRESSION: Small bowel obstruction.  Nasogastric tube coiled in proximal stomach with tip projecting over proximal antrum.   Electronically Signed   By: Lavonia Dana M.D.   On: 04/23/2015 12:45    Medications:  Prior to Admission:  Prescriptions prior to admission  Medication Sig Dispense Refill Last Dose  . albuterol (PROVENTIL HFA;VENTOLIN HFA) 108 (90 BASE) MCG/ACT inhaler Inhale 2 puffs into the lungs every 6 (six) hours as needed for wheezing or shortness of breath.   unknown  . ALPRAZolam (XANAX) 0.25 MG tablet Take 0.25 mg by mouth 2 (two) times daily as needed for anxiety.   unknown  . amLODipine (NORVASC) 5 MG tablet Take 5 mg AM and 5 mg PM (Patient taking differently: Take 5 mg by mouth 2 (two) times daily. Take 5 mg AM and 5 mg PM) 180 tablet 3 04/21/2015 at Unknown time  . aspirin EC 81 MG tablet Take 81 mg by mouth daily.   04/21/2015 at Unknown time   . Cyanocobalamin 2500 MCG CHEW Chew 1 tablet by mouth daily.   04/21/2015 at Unknown time  . dicyclomine (BENTYL) 20 MG tablet Take 20 mg by mouth daily.    04/21/2015 at Unknown time  . esomeprazole (NEXIUM) 20 MG capsule Take 20 mg by mouth daily at 12 noon.   04/21/2015 at Unknown time  . fexofenadine (ALLEGRA) 180 MG tablet Take 180 mg by mouth daily.   04/21/2015 at Unknown time  . Fluticasone-Salmeterol (ADVAIR) 250-50 MCG/DOSE AEPB Inhale 1 puff into the lungs daily.    04/21/2015 at Unknown time  . ibuprofen (ADVIL,MOTRIN) 200 MG tablet Take 200 mg by mouth every 6 (six) hours as needed for mild pain or moderate pain.    04/22/2015 at Unknown time  . Multiple Vitamins-Minerals (PRESERVISION AREDS PO) Take 1 tablet by mouth daily.   04/21/2015 at Unknown time  . Probiotic Product (ALIGN  PO) Take 1 capsule by mouth daily.   04/21/2015 at Unknown time  . topiramate (TOPAMAX) 25 MG tablet Take 1 tablet (25 mg total) by mouth at bedtime. 30 tablet 2 04/21/2015 at Unknown time   Scheduled: . bisacodyl  10 mg Rectal BID  . cefTRIAXone (ROCEPHIN)  IV  1 g Intravenous Q24H  . heparin subcutaneous  5,000 Units Subcutaneous 3 times per day  . mometasone-formoterol  2 puff Inhalation BID  . pantoprazole (PROTONIX) IV  40 mg Intravenous Q24H  . sodium chloride  3 mL Intravenous Q12H   Continuous:  VUF:[id number], diphenhydrAMINE, HYDROmorphone (DILAUDID) injection, ondansetron (ZOFRAN) IV  Assesment: She was admitted with partial small bowel obstruction. She appears to have cleared that early this morning. She has improved. She still has some abdominal pain but it is much less. She has had multiple falls at home and has been very weak. Active Problems:   Abdominal pain   SBO (small bowel obstruction)    Plan: Clear liquid diet advance as tolerated and she will have PT consultation so that she doesn't get increasing weakness.    LOS: 2 days   Lucylle Foulkes L 04/24/2015, 8:15 AM

## 2015-04-24 NOTE — Plan of Care (Signed)
Problem: Acute Rehab PT Goals(only PT should resolve) Goal: Patient Will Transfer Sit To/From Stand Pt will transfer sit to/from-stand with RW at [facility] without loss-of-balance to demonstrate good safety awareness for independent mobility in home.     Goal: Pt Will Ambulate Pt will ambulate with RW at [facility] using a step-through pattern and equal step length for a distances greater than 76ft to demonstrate the ability to perform safe household distance ambulation at discharge.

## 2015-04-24 NOTE — Evaluation (Signed)
Physical Therapy Evaluation Patient Details Name: Kristie Morris MRN: [id number] DOB: 1924/12/25 Today's Date: 04/24/2015   History of Present Illness  81 yo female with htn, crohns disease, hernia, apparently started to have abdominal pain this am. " sharp: Straight across the middle of her abdomen", associated with n/v. Pt denies fever, chills, diarrhea, brbpr, black stool. Pt will be admitted for possible SBO. Dr. Arnoldo Morale has been consulted by ED, we appreciate his input.   Clinical Impression  Pt is received semirecumbent in bed upon entry, awake, alert, and willing to participate. No acute distress noted.  Pt is A&Ox3 and pleasant. Pt presenting with significant balance impairments as seen during multiple LOB requiring ModAssistance to recover; this however, is close to pt baseline. Weakness in LLE residual from CVA in April 2016 remains, and is generally weaker than at baseline, but all other strength appears to present close to baseline. Pt has good social support at home and would do well to DC home and continue with HHPT. Patient presenting with impairment of strength, range of motion, balance, and activity tolerance, limiting ability to perform ADL and mobility tasks at  baseline level of function. Patient will benefit from skilled intervention to address the above impairments and limitations, in order to restore to prior level of function, improve patient safety upon discharge, and to decrease falls risk.      Follow Up Recommendations Home health PT (DC to home and continue c current HHPT team ([facility]) )    Equipment Recommendations  None recommended by PT    Recommendations for Other Services       Precautions / Restrictions Precautions Precautions: None Restrictions Weight Bearing Restrictions: No      Mobility  Bed Mobility Overal bed mobility: +2 for physical assistance;Needs Assistance Bed Mobility: Supine to Sit     Supine to sit: Max assist     General bed  mobility comments: Help c forward scoot to EOB, and trunk righting, require soem time to establish trunk support and balance for indep sitting.   Transfers Overall transfer level: Needs assistance Equipment used: Rolling walker (2 wheeled) Transfers: Sit to/from Stand Sit to Stand: Mod assist         General transfer comment: Able to come to standing MinGuard, but c multiple LOB, falling backwards toward bed, required modA from PT to correct.   Ambulation/Gait Ambulation/Gait assistance: Mod assist Ambulation Distance (Feet): 6 Feet Assistive device: Rolling walker (2 wheeled)     Gait velocity interpretation: Below normal speed for age/gender General Gait Details: Very sort steps, limited ability to progress and self support using LLE. BUE very strong; easily LOB backwards, requiring PT to correct.   Stairs            Wheelchair Mobility    Modified Rankin (Stroke Patients Only)       Balance Overall balance assessment: Needs assistance Sitting-balance support: No upper extremity supported;Feet supported Sitting balance-Leahy Scale: Poor   Postural control: Posterior lean Standing balance support: Bilateral upper extremity supported Standing balance-Leahy Scale: Poor                               Pertinent Vitals/Pain Pain Assessment: No/denies pain (Mild residual soreness remains upon palpation of abdomen, otherwise painfree. )    Home Living Family/patient expects to be discharged to:: Private residence Living Arrangements: Alone Available Help at Discharge: Family;Available 24 hours/day (Family ahs been living in home assisting since  CVA in April. ) Type of Home: House Home Access: Level entry     Home Layout: One level        Prior Function Level of Independence: Needs assistance   Gait / Transfers Assistance Needed: Transfer chair for mobility; recently able to ambulate up to 20 feet c HHPT, still making progress.  ADL's / Homemaking  Assistance Needed: Able to bathe and dress self c supervision; assistance with everythng else.         Hand Dominance        Extremity/Trunk Assessment               Lower Extremity Assessment: Generalized weakness;LLE deficits/detail   LLE Deficits / Details: 50% of strength in LLE per patient. CVA in April 2016, function continues to improve.   Cervical / Trunk Assessment: Other exceptions  Communication   Communication: No difficulties  Cognition Arousal/Alertness: Awake/alert Behavior During Therapy: WFL for tasks assessed/performed Overall Cognitive Status: Within Functional Limits for tasks assessed                      General Comments      Exercises        Assessment/Plan    PT Assessment Patient needs continued PT services  PT Diagnosis Difficulty walking;Hemiplegia non-dominant side;Abnormality of gait;Generalized weakness   PT Problem List Decreased strength;Decreased balance;Decreased activity tolerance;Decreased mobility;Decreased coordination  PT Treatment Interventions Gait training;Functional mobility training;Therapeutic activities;Therapeutic exercise;Balance training;Neuromuscular re-education   PT Goals (Current goals can be found in the Care Plan section) Acute Rehab PT Goals Patient Stated Goal: Recover recent lost progress in ambulation.  PT Goal Formulation: With patient Time For Goal Achievement: 05/08/15 Potential to Achieve Goals: Good    Frequency Min 3X/week   Barriers to discharge        Co-evaluation               End of Session Equipment Utilized During Treatment: Gait belt Activity Tolerance: Patient tolerated treatment well;Other (comment) (Limited by weakness, worse in LLE than baseline. ) Patient left: in bed;with call bell/phone within reach Nurse Communication: Other (comment)         Time: 1000-1022 PT Time Calculation (min) (ACUTE ONLY): 22 min   Charges:   PT Evaluation $Initial PT  Evaluation Tier I: 1 Procedure     PT G Codes:        Emony Dormer C 05/02/2015, 11:19 AM  11:21 AM  Etta Grandchild, PT, DPT [place] License # [id number]

## 2015-04-25 DIAGNOSIS — K5669 Other intestinal obstruction: Secondary | ICD-10-CM | POA: Diagnosis not present

## 2015-04-25 LAB — BASIC METABOLIC PANEL
Anion gap: 8 (ref 5–15)
BUN: 26 mg/dL — AB (ref 6–20)
CO2: 28 mmol/L (ref 22–32)
Calcium: 8.5 mg/dL — ABNORMAL LOW (ref 8.9–10.3)
Chloride: 102 mmol/L (ref 101–111)
Creatinine, Ser: 1.01 mg/dL — ABNORMAL HIGH (ref 0.44–1.00)
GFR calc Af Amer: 56 mL/min — ABNORMAL LOW (ref >60)
GFR, EST NON AFRICAN AMERICAN: 48 mL/min — AB (ref >60)
Glucose, Bld: 107 mg/dL — ABNORMAL HIGH (ref 65–99)
Potassium: 3.9 mmol/L (ref 3.5–5.1)
SODIUM: 138 mmol/L (ref 135–145)

## 2015-04-25 MED ORDER — MAGNESIUM HYDROXIDE 400 MG/5ML PO SUSP
30.0000 mL | Freq: Two times a day (BID) | ORAL | Status: DC
  Administered 2015-04-25 – 2015-04-26 (×3): 30 mL via ORAL
  Filled 2015-04-25 (×4): qty 30

## 2015-04-25 MED ORDER — AMLODIPINE BESYLATE 5 MG PO TABS
5.0000 mg | ORAL_TABLET | Freq: Two times a day (BID) | ORAL | Status: DC
  Administered 2015-04-25 – 2015-04-27 (×4): 5 mg via ORAL
  Filled 2015-04-25 (×4): qty 1

## 2015-04-25 NOTE — Care Management Important Message (Signed)
Important Message  Patient Details  Name: Kristie Morris MRN: [id number] Date of Birth: 10-07-1924   Medicare Important Message Given:  Yes-second notification given    Sherald Barge, RN 04/25/2015, 9:23 AM

## 2015-04-25 NOTE — Progress Notes (Signed)
Physical Therapy Treatment Patient Details Name: Kristie Morris MRN: [id number] DOB: December 21, 1924 Today's Date: 04/25/2015    History of Present Illness 81 yo female with htn, crohns disease, hernia, apparently started to have abdominal pain this am. " sharp: Straight across the middle of her abdomen", associated with n/v. Pt denies fever, chills, diarrhea, brbpr, black stool. Pt will be admitted for possible SBO. Dr. Arnoldo Morale has been consulted by ED, we appreciate his input.     PT Comments    Pt was up in a chair at the time of my arrival, still has not moved her bowels and is quite concerned about this.  She was anxious to participate in PT and was able to tolerate seated LE strengthening exercise well.  Her left iliopsoas is particularly weak (2+/5) which is definitely seen during gait.  Her sitting balance is much improved as is her ability to rise from sitting.  Her gait is unstable with a walker due to weakness in the LLE but she was able to ambulate about 40' with min assist.  She was quite fatigued after gait and unable to participate in any balance exercise.  Follow Up Recommendations  Home health PT     Equipment Recommendations  None recommended by PT    Recommendations for Other Services  none     Precautions / Restrictions Precautions Precautions: Fall Restrictions Weight Bearing Restrictions: No    Mobility  Bed Mobility               General bed mobility comments: not addressed...pt up in chair  Transfers Overall transfer level: Needs assistance Equipment used: Rolling walker (2 wheeled) Transfers: Sit to/from Stand Sit to Stand: Min assist         General transfer comment: pt  fell backward into chair on 1st attempt at standing..she was instructed in widening base of support and increasing anterior thrust with standing...she became more successful and was able to stand x 5 repetitions with min assist and no LOB  Ambulation/Gait Ambulation/Gait  assistance: Min assist Ambulation Distance (Feet): 40 Feet Assistive device: Rolling walker (2 wheeled) Gait Pattern/deviations: Decreased weight shift to left;Decreased step length - left;Decreased dorsiflexion - left;Trunk flexed   Gait velocity interpretation: <1.8 ft/sec, indicative of risk for recurrent falls General Gait Details: LLE fatigues rapidly and pt needs to stop intermittently to regain proper stance...she has significant advancing the LLE   Stairs            Wheelchair Mobility    Modified Rankin (Stroke Patients Only)       Balance Overall balance assessment: Needs assistance Sitting-balance support: No upper extremity supported;Feet supported Sitting balance-Leahy Scale: Good     Standing balance support: Bilateral upper extremity supported;During functional activity Standing balance-Leahy Scale: Fair                      Cognition Arousal/Alertness: Awake/alert Behavior During Therapy: WFL for tasks assessed/performed Overall Cognitive Status: Within Functional Limits for tasks assessed                      Exercises General Exercises - Lower Extremity Long Arc Quad: Strengthening;Right;Left;10 reps;Seated Hip ABduction/ADduction: Strengthening;Both;10 reps;Seated Hip Flexion/Marching: AROM;Both;10 reps;Seated Heel Raises: Strengthening;Both;10 reps;Standing Other Exercises Other Exercises: sit to stand x 5 repetitions    General Comments        Pertinent Vitals/Pain Pain Assessment: No/denies pain    Home Living  Prior Function            PT Goals (current goals can now be found in the care plan section) Progress towards PT goals: Progressing toward goals    Frequency  Min 3X/week    PT Plan Current plan remains appropriate    Co-evaluation             End of Session Equipment Utilized During Treatment: Gait belt Activity Tolerance: Patient limited by fatigue Patient left:  in chair;with call bell/phone within reach     Time: 1001-1040 PT Time Calculation (min) (ACUTE ONLY): 39 min  Charges:  $Gait Training: 8-22 mins $Therapeutic Exercise: 23-37 mins                    G CodesSable Feil  PT 04/25/2015, 10:56 AM [phone]

## 2015-04-25 NOTE — Progress Notes (Signed)
Subjective: She feels better but is still having some nausea and felt like she was going to vomit this morning. She says she's having gas now but no BM. She has some nausea and abdominal pain.  Objective: Vital signs in last 24 hours: Temp:  [97.4 F (36.3 C)-98.9 F (37.2 C)] 97.4 F (36.3 C) (07/21 0529) Pulse Rate:  [70-82] 70 (07/21 0529) Resp:  [20] 20 (07/21 0529) BP: (108-138)/(59-72) 108/68 mmHg (07/21 0529) SpO2:  [92 %-98 %] 92 % (07/21 0731) Weight:  [88.8 kg (195 lb 12.3 oz)] 88.8 kg (195 lb 12.3 oz) (07/21 0529) Weight change: 0.9 kg (1 lb 15.8 oz) Last BM Date: 04/24/15  Intake/Output from previous day: 07/20 0701 - 07/21 0700 In: 580 [P.O.:480; IV Piggyback:100] Out: -   PHYSICAL EXAM General appearance: alert, cooperative and no distress Resp: clear to auscultation bilaterally Cardio: regular rate and rhythm, S1, S2 normal, no murmur, click, rub or gallop GI: Mildly tender bowel sounds are present Extremities: extremities normal, atraumatic, no cyanosis or edema  Lab Results:  Results for orders placed or performed during the hospital encounter of 04/22/15 (from the past 48 hour(s))  Basic metabolic panel     Status: Abnormal   Collection Time: 04/24/15  5:24 AM  Result Value Ref Range   Sodium 140 135 - 145 mmol/L   Potassium 4.1 3.5 - 5.1 mmol/L   Chloride 103 101 - 111 mmol/L   CO2 27 22 - 32 mmol/L   Glucose, Bld 118 (H) 65 - 99 mg/dL   BUN 35 (H) 6 - 20 mg/dL   Creatinine, Ser 1.16 (H) 0.44 - 1.00 mg/dL   Calcium 8.7 (L) 8.9 - 10.3 mg/dL   GFR calc non Af Amer 41 (L) >60 mL/min   GFR calc Af Amer 47 (L) >60 mL/min    Comment: (NOTE) The eGFR has been calculated using the CKD EPI equation. This calculation has not been validated in all clinical situations. eGFR's persistently <60 mL/min signify possible Chronic Kidney Disease.    Anion gap 10 5 - 15  Basic metabolic panel     Status: Abnormal   Collection Time: 04/25/15  5:35 AM  Result Value  Ref Range   Sodium 138 135 - 145 mmol/L   Potassium 3.9 3.5 - 5.1 mmol/L   Chloride 102 101 - 111 mmol/L   CO2 28 22 - 32 mmol/L   Glucose, Bld 107 (H) 65 - 99 mg/dL   BUN 26 (H) 6 - 20 mg/dL   Creatinine, Ser 1.01 (H) 0.44 - 1.00 mg/dL   Calcium 8.5 (L) 8.9 - 10.3 mg/dL   GFR calc non Af Amer 48 (L) >60 mL/min   GFR calc Af Amer 56 (L) >60 mL/min    Comment: (NOTE) The eGFR has been calculated using the CKD EPI equation. This calculation has not been validated in all clinical situations. eGFR's persistently <60 mL/min signify possible Chronic Kidney Disease.    Anion gap 8 5 - 15    ABGS No results for input(s): PHART, PO2ART, TCO2, HCO3 in the last 72 hours.  Invalid input(s): PCO2 CULTURES No results found for this or any previous visit (from the past 240 hour(s)). Studies/Results: Dg Chest Port 1 View  04/23/2015   CLINICAL DATA:  Follow-up small bowel obstruction  EXAM: PORTABLE CHEST - 1 VIEW  COMPARISON:  PA and lateral chest x-ray of April 22, 2015  FINDINGS: The right lung is well-expanded and clear. On the left there is mild  increased density at the lung base which is stable. The cardiac silhouette is top-normal in size but stable. The pulmonary vascularity is normal. There are surgical clips in the axillary regions bilaterally. The esophagogastric tube tip projects below the GE junction. A KUB film is recommended.  IMPRESSION: Allowing for patient rotation there has not been significant interval change since yesterday's study. The esophagogastric tube tip appears to lie below the GE junction but it is not well demonstrated. A lower abdominal lower chest-upper abdominal film is recommended.   Electronically Signed   By: David  Martinique M.D.   On: 04/23/2015 09:08   Dg Abd Portable 1v  04/24/2015   CLINICAL DATA:  Small-bowel obstruction  EXAM: PORTABLE ABDOMEN - 1 VIEW  COMPARISON:  04/23/2015, CT 04/22/2015  FINDINGS: Dilated small bowel obstruction compatible with small bowel  obstruction. Mild progression of small bowel dilatation. Colon remains decompressed with contrast in the right colon.  IMPRESSION: Small-bowel obstruction with mild progression.   Electronically Signed   By: Franchot Gallo M.D.   On: 04/24/2015 09:42   Dg Abd Portable 1v  04/23/2015   CLINICAL DATA:  Nasogastric tube placement  EXAM: PORTABLE ABDOMEN - 1 VIEW  COMPARISON:  Portable exam 1228 hours compared to CT abdomen and pelvis 04/22/2015  FINDINGS: Nasogastric tube coiled in proximal stomach with tip projecting over proximal antrum.  Dilated small bowel loops consistent with paucity of gas within the colon compatible with small bowel obstruction.  No definite bowel wall thickening.  Retained contrast within a few dilated small bowel loops.  Bones demineralized.  Question mild levoconvex scoliosis.  IMPRESSION: Small bowel obstruction.  Nasogastric tube coiled in proximal stomach with tip projecting over proximal antrum.   Electronically Signed   By: Lavonia Dana M.D.   On: 04/23/2015 12:45    Medications:  Prior to Admission:  Prescriptions prior to admission  Medication Sig Dispense Refill Last Dose  . albuterol (PROVENTIL HFA;VENTOLIN HFA) 108 (90 BASE) MCG/ACT inhaler Inhale 2 puffs into the lungs every 6 (six) hours as needed for wheezing or shortness of breath.   unknown  . ALPRAZolam (XANAX) 0.25 MG tablet Take 0.25 mg by mouth 2 (two) times daily as needed for anxiety.   unknown  . amLODipine (NORVASC) 5 MG tablet Take 5 mg AM and 5 mg PM (Patient taking differently: Take 5 mg by mouth 2 (two) times daily. Take 5 mg AM and 5 mg PM) 180 tablet 3 04/21/2015 at Unknown time  . aspirin EC 81 MG tablet Take 81 mg by mouth daily.   04/21/2015 at Unknown time  . Cyanocobalamin 2500 MCG CHEW Chew 1 tablet by mouth daily.   04/21/2015 at Unknown time  . dicyclomine (BENTYL) 20 MG tablet Take 20 mg by mouth daily.    04/21/2015 at Unknown time  . esomeprazole (NEXIUM) 20 MG capsule Take 20 mg by mouth  daily at 12 noon.   04/21/2015 at Unknown time  . fexofenadine (ALLEGRA) 180 MG tablet Take 180 mg by mouth daily.   04/21/2015 at Unknown time  . Fluticasone-Salmeterol (ADVAIR) 250-50 MCG/DOSE AEPB Inhale 1 puff into the lungs daily.    04/21/2015 at Unknown time  . ibuprofen (ADVIL,MOTRIN) 200 MG tablet Take 200 mg by mouth every 6 (six) hours as needed for mild pain or moderate pain.    04/22/2015 at Unknown time  . Multiple Vitamins-Minerals (PRESERVISION AREDS PO) Take 1 tablet by mouth daily.   04/21/2015 at Unknown time  . Probiotic Product (  ALIGN PO) Take 1 capsule by mouth daily.   04/21/2015 at Unknown time  . topiramate (TOPAMAX) 25 MG tablet Take 1 tablet (25 mg total) by mouth at bedtime. 30 tablet 2 04/21/2015 at Unknown time   Scheduled: . bisacodyl  10 mg Rectal BID  . cefTRIAXone (ROCEPHIN)  IV  1 g Intravenous Q24H  . heparin subcutaneous  5,000 Units Subcutaneous 3 times per day  . magnesium hydroxide  30 mL Oral BID  . mometasone-formoterol  2 puff Inhalation BID  . pantoprazole  40 mg Oral Daily  . sodium chloride  3 mL Intravenous Q12H   Continuous:  FBP:[id number], albuterol, diphenhydrAMINE, HYDROmorphone (DILAUDID) injection, ondansetron (ZOFRAN) IV  Assesment: She was admitted with partial small bowel obstruction. She is improving but I don't think she is ready for discharge  She has multiple other medical problems including recurrent falls and an abnormal gait. She has had breast cancer. She may have a UTI but urine culture was not done however she is on Rocephin which should cover. Active Problems:   Abnormality of gait   Recurrent falls   Abdominal pain   SBO (small bowel obstruction)    Plan: Continue current treatments. She is going to continue with physical therapy. Continue Rocephin. Her diet has been advanced. She may be ready to go home tomorrow    LOS: 3 days   Pairlee Sawtell L 04/25/2015, 8:50 AM

## 2015-04-25 NOTE — Progress Notes (Signed)
Subjective: Had another bowel movement yesterday afternoon. Still with lower abdominal pain which is the same as yesterday. Is tolerating clear liquid diet well.  Objective: Vital signs in last 24 hours: Temp:  [97.4 F (36.3 C)-98.9 F (37.2 C)] 97.4 F (36.3 C) (07/21 0529) Pulse Rate:  [70-82] 70 (07/21 0529) Resp:  [20] 20 (07/21 0529) BP: (108-138)/(59-72) 108/68 mmHg (07/21 0529) SpO2:  [94 %-98 %] 96 % (07/21 0529) Weight:  [88.8 kg (195 lb 12.3 oz)] 88.8 kg (195 lb 12.3 oz) (07/21 0529) Last BM Date: 04/24/15  Intake/Output from previous day: 07/20 0701 - 07/21 0700 In: 580 [P.O.:480; IV Piggyback:100] Out: -  Intake/Output this shift:    General appearance: alert, cooperative and no distress GI: Soft, non-distended. No specific tenderness noted. No rigidity noted. Bowel sounds appreciated.  Lab Results:   Recent Labs  04/22/15 1433 04/23/15 0627  WBC 18.3* 15.5*  HGB 13.3 13.4  HCT 41.3 42.5  PLT 391 366   BMET  Recent Labs  04/24/15 0524 04/25/15 0535  NA 140 138  K 4.1 3.9  CL 103 102  CO2 27 28  GLUCOSE 118* 107*  BUN 35* 26*  CREATININE 1.16* 1.01*  CALCIUM 8.7* 8.5*   PT/INR No results for input(s): LABPROT, INR in the last 72 hours.  Studies/Results: Dg Chest Port 1 View  04/23/2015   CLINICAL DATA:  Follow-up small bowel obstruction  EXAM: PORTABLE CHEST - 1 VIEW  COMPARISON:  PA and lateral chest x-ray of April 22, 2015  FINDINGS: The right lung is well-expanded and clear. On the left there is mild increased density at the lung base which is stable. The cardiac silhouette is top-normal in size but stable. The pulmonary vascularity is normal. There are surgical clips in the axillary regions bilaterally. The esophagogastric tube tip projects below the GE junction. A KUB film is recommended.  IMPRESSION: Allowing for patient rotation there has not been significant interval change since yesterday's study. The esophagogastric tube tip appears to  lie below the GE junction but it is not well demonstrated. A lower abdominal lower chest-upper abdominal film is recommended.   Electronically Signed   By: David  Martinique M.D.   On: 04/23/2015 09:08   Dg Abd Portable 1v  04/24/2015   CLINICAL DATA:  Small-bowel obstruction  EXAM: PORTABLE ABDOMEN - 1 VIEW  COMPARISON:  04/23/2015, CT 04/22/2015  FINDINGS: Dilated small bowel obstruction compatible with small bowel obstruction. Mild progression of small bowel dilatation. Colon remains decompressed with contrast in the right colon.  IMPRESSION: Small-bowel obstruction with mild progression.   Electronically Signed   By: Franchot Gallo M.D.   On: 04/24/2015 09:42   Dg Abd Portable 1v  04/23/2015   CLINICAL DATA:  Nasogastric tube placement  EXAM: PORTABLE ABDOMEN - 1 VIEW  COMPARISON:  Portable exam 1228 hours compared to CT abdomen and pelvis 04/22/2015  FINDINGS: Nasogastric tube coiled in proximal stomach with tip projecting over proximal antrum.  Dilated small bowel loops consistent with paucity of gas within the colon compatible with small bowel obstruction.  No definite bowel wall thickening.  Retained contrast within a few dilated small bowel loops.  Bones demineralized.  Question mild levoconvex scoliosis.  IMPRESSION: Small bowel obstruction.  Nasogastric tube coiled in proximal stomach with tip projecting over proximal antrum.   Electronically Signed   By: Lavonia Dana M.D.   On: 04/23/2015 12:45    Anti-infectives: Anti-infectives    Start     Dose/Rate Route  Frequency Ordered Stop   04/24/15 0600  cefTRIAXone (ROCEPHIN) 1 g in dextrose 5 % 50 mL IVPB     1 g 100 mL/hr over 30 Minutes Intravenous Every 24 hours 04/23/15 0759     04/23/15 0545  cefTRIAXone (ROCEPHIN) 1 g in dextrose 5 % 50 mL IVPB - Premix  Status:  Discontinued     1 g 100 mL/hr over 30 Minutes Intravenous Every 24 hours 04/23/15 0537 04/23/15 0800      Assessment/Plan: Impression: Small bowel obstruction, partial,  resolving. No need for acute surgical intervention. Plan: We'll advance to full liquid diet. Will add milk of Magnesia.  LOS: 3 days    Bodhi Moradi A 04/25/2015

## 2015-04-26 ENCOUNTER — Inpatient Hospital Stay (HOSPITAL_COMMUNITY): Payer: Commercial Managed Care - HMO

## 2015-04-26 DIAGNOSIS — K5660 Unspecified intestinal obstruction: Secondary | ICD-10-CM | POA: Diagnosis not present

## 2015-04-26 NOTE — Care Management Important Message (Signed)
Important Message  Patient Details  Name: MARVEL RASH MRN: [id number] Date of Birth: Feb 07, 1925   Medicare Important Message Given:  Yes-third notification given    Sherald Barge, RN 04/26/2015, 8:58 AM

## 2015-04-26 NOTE — Care Management Note (Signed)
Case Management Note  Patient Details  Name: XANDRIA LECHTENBERG MRN: [id number] Date of Birth: [birth year]  Expected Discharge Date:    04/26/2015              Expected Discharge Plan:  [place]  In-House Referral:  NA  Discharge planning Services  CM Consult  Post Acute Care Choice:  Resumption of Svcs/PTA Provider Choice offered to:  Patient  DME Arranged:    DME Agency:     HH Arranged:  RN, PT, Speech Therapy HH Agency:  [place]  Status of Service:  Completed, signed off  Medicare Important Message Given:  Yes-third notification given Date Medicare IM Given:    Medicare IM give by:    Date Additional Medicare IM Given:    Additional Medicare Important Message give by:     If discussed at [place] of Stay Meetings, dates discussed:    Additional Comments: Pt anticipates DC home over weekend. [facility] is aware of discharge plan. Discharging RN will call to notify [facility] of discharge over weekend. No further CM needs anticipated.  Sherald Barge, RN 04/26/2015, 9:00 AM

## 2015-04-26 NOTE — Progress Notes (Signed)
  Subjective: Patient still with nausea but no emesis. Had a bowel movement just prior to me coming into the room. She now states she wants to go home. Her abdominal pain has resolved.  Objective: Vital signs in last 24 hours: Temp:  [97.8 F (36.6 C)-97.9 F (36.6 C)] 97.8 F (36.6 C) (07/22 0552) Pulse Rate:  [74-79] 74 (07/22 0552) Resp:  [20] 20 (07/22 0552) BP: (116-152)/(56-75) 116/75 mmHg (07/22 0927) SpO2:  [94 %-96 %] 95 % (07/22 0728) Weight:  [88 kg (194 lb 0.1 oz)] 88 kg (194 lb 0.1 oz) (07/22 0552) Last BM Date: 04/24/15  Intake/Output from previous day: 07/21 0701 - 07/22 0700 In: 290 [P.O.:240; IV Piggyback:50] Out: -  Intake/Output this shift: Total I/O In: 240 [P.O.:240] Out: -   General appearance: alert, cooperative and no distress GI: Soft, nontender, nondistended.  Lab Results:  No results for input(s): WBC, HGB, HCT, PLT in the last 72 hours. BMET  Recent Labs  04/24/15 0524 04/25/15 0535  NA 140 138  K 4.1 3.9  CL 103 102  CO2 27 28  GLUCOSE 118* 107*  BUN 35* 26*  CREATININE 1.16* 1.01*  CALCIUM 8.7* 8.5*   PT/INR No results for input(s): LABPROT, INR in the last 72 hours.  Studies/Results: No results found.  Anti-infectives: Anti-infectives    Start     Dose/Rate Route Frequency Ordered Stop   04/24/15 0600  cefTRIAXone (ROCEPHIN) 1 g in dextrose 5 % 50 mL IVPB     1 g 100 mL/hr over 30 Minutes Intravenous Every 24 hours 04/23/15 0759     04/23/15 0545  cefTRIAXone (ROCEPHIN) 1 g in dextrose 5 % 50 mL IVPB - Premix  Status:  Discontinued     1 g 100 mL/hr over 30 Minutes Intravenous Every 24 hours 04/23/15 0537 04/23/15 0800      Assessment/Plan: Impression: Partial small bowel obstruction, slowly resolving. Patient states she definitely feels better than earlier today. Plan: I told patient I would like her to eat a soft diet prior to being discharged. I have canceled her x-rays for this morning. Her diet has been advanced.  LOS: 4 days    Jordon Bourquin A 04/26/2015

## 2015-04-26 NOTE — Progress Notes (Signed)
Physical Therapy Treatment Patient Details Name: Kristie Morris MRN: [id number] DOB: 1924/11/02 Today's Date: 04/26/2015    History of Present Illness 81 yo female with htn, crohns disease, hernia, apparently started to have abdominal pain this am. " sharp: Straight across the middle of her abdomen", associated with n/v. Pt denies fever, chills, diarrhea, brbpr, black stool. Pt will be admitted for possible SBO. Dr. Arnoldo Morale has been consulted by ED, we appreciate his input.     PT Comments    Pt c/o general malaise today.  Reports a headache and fatigue from having to urinate so many times early this AM (8 times within a few hours).  She was willing to work with me and needed only min assist to transfer OOB and transfer to the chair using a walker.  We initiated general stretching and UE exercise where a few exercises were completed.  She then had to use the bathroom so she was able to ambulate using the walker with verbal cues to stay within the walker and increasing weight shift to the right for facilitation of left hip flexion.  She was successful in having a bowel movement.  By the time all of this was completed she was too tired to do any further activity.  It was noted that pt had a cough with deep breathing and she reported a heaviness in her chest.  This was relayed to her RN.  Follow Up Recommendations  Home health PT     Equipment Recommendations  None recommended by PT    Recommendations for Other Services  none     Precautions / Restrictions Precautions Precautions: Fall Restrictions Weight Bearing Restrictions: No    Mobility  Bed Mobility   Bed Mobility: Supine to Sit     Supine to sit: Min assist        Transfers Overall transfer level: Needs assistance Equipment used: Rolling walker (2 wheeled) Transfers: Sit to/from Stand Sit to Stand: Min assist            Ambulation/Gait Ambulation/Gait assistance: Supervision Ambulation Distance (Feet): 10  Feet (x2 repetitions) Assistive device: Rolling walker (2 wheeled) Gait Pattern/deviations: Decreased step length - left;Decreased dorsiflexion - left;Decreased weight shift to left;Trunk flexed   Gait velocity interpretation: <1.8 ft/sec, indicative of risk for recurrent falls     Stairs            Wheelchair Mobility    Modified Rankin (Stroke Patients Only)       Balance                                    Cognition Arousal/Alertness: Awake/alert Behavior During Therapy: WFL for tasks assessed/performed Overall Cognitive Status: Within Functional Limits for tasks assessed                      Exercises General Exercises - Upper Extremity Shoulder Flexion: AROM;Both;5 reps;Seated Other Exercises Other Exercises: shoulder elevation x 5 repetions Other Exercises: cervical rotation x 3 repetitions    General Comments        Pertinent Vitals/Pain Pain Assessment: Faces Faces Pain Scale: Hurts whole lot Pain Location: headache Pain Descriptors / Indicators: Headache Pain Intervention(s): Limited activity within patient's tolerance;Patient requesting pain meds-RN notified    Home Living                      Prior Function  PT Goals (current goals can now be found in the care plan section) Progress towards PT goals: Not progressing toward goals - comment (pt c/o fatigue and headache)    Frequency  Min 3X/week    PT Plan Current plan remains appropriate    Co-evaluation             End of Session Equipment Utilized During Treatment: Gait belt Activity Tolerance: Patient limited by fatigue;Patient limited by pain Patient left: in chair;with call bell/phone within reach;with nursing/sitter in room     Time: [id number] PT Time Calculation (min) (ACUTE ONLY): 39 min  Charges:  $Gait Training: 8-22 mins $Therapeutic Exercise: 8-22 mins                    G CodesSable Feil  PT 04/26/2015,  9:25 AM [phone]

## 2015-04-26 NOTE — Progress Notes (Signed)
Subjective: Patient feels nausea still. Feels she will vomit if she eats. She has not moved her bowel yet.   Objective: Vital signs in last 24 hours: Temp:  [97.8 F (36.6 C)-97.9 F (36.6 C)] 97.8 F (36.6 C) (07/22 0552) Pulse Rate:  [74-79] 74 (07/22 0552) Resp:  [20] 20 (07/22 0552) BP: (141-152)/(56-66) 141/56 mmHg (07/22 0552) SpO2:  [94 %-96 %] 95 % (07/22 0728) Weight:  [88 kg (194 lb 0.1 oz)] 88 kg (194 lb 0.1 oz) (07/22 0552) Weight change: -0.8 kg (-1 lb 12.2 oz) Last BM Date: 04/24/15  Intake/Output from previous day: 07/21 0701 - 07/22 0700 In: 290 [P.O.:240; IV Piggyback:50] Out: -   PHYSICAL EXAM General appearance: alert and no distress Resp: clear to auscultation bilaterally Cardio: S1, S2 normal GI: full abdonmen, bowel sound is ++ Extremities: extremities normal, atraumatic, no cyanosis or edema  Lab Results:  Results for orders placed or performed during the hospital encounter of 04/22/15 (from the past 48 hour(s))  Basic metabolic panel     Status: Abnormal   Collection Time: 04/25/15  5:35 AM  Result Value Ref Range   Sodium 138 135 - 145 mmol/L   Potassium 3.9 3.5 - 5.1 mmol/L   Chloride 102 101 - 111 mmol/L   CO2 28 22 - 32 mmol/L   Glucose, Bld 107 (H) 65 - 99 mg/dL   BUN 26 (H) 6 - 20 mg/dL   Creatinine, Ser 1.01 (H) 0.44 - 1.00 mg/dL   Calcium 8.5 (L) 8.9 - 10.3 mg/dL   GFR calc non Af Amer 48 (L) >60 mL/min   GFR calc Af Amer 56 (L) >60 mL/min    Comment: (NOTE) The eGFR has been calculated using the CKD EPI equation. This calculation has not been validated in all clinical situations. eGFR's persistently <60 mL/min signify possible Chronic Kidney Disease.    Anion gap 8 5 - 15    ABGS No results for input(s): PHART, PO2ART, TCO2, HCO3 in the last 72 hours.  Invalid input(s): PCO2 CULTURES No results found for this or any previous visit (from the past 240 hour(s)). Studies/Results: Dg Abd Portable 1v  04/24/2015   CLINICAL DATA:   Small-bowel obstruction  EXAM: PORTABLE ABDOMEN - 1 VIEW  COMPARISON:  04/23/2015, CT 04/22/2015  FINDINGS: Dilated small bowel obstruction compatible with small bowel obstruction. Mild progression of small bowel dilatation. Colon remains decompressed with contrast in the right colon.  IMPRESSION: Small-bowel obstruction with mild progression.   Electronically Signed   By: Franchot Gallo M.D.   On: 04/24/2015 09:42    Medications: I have reviewed the patient's current medications.  Assesment:   Active Problems:   Abnormality of gait   Recurrent falls   Abdominal pain   SBO (small bowel obstruction)    Plan:  Medications reviewed Will repeat KUB CBC/BMP Continue to try liquid diet. If her nausea subsides and moves her bowel she may diascharge home tomorrow.   LOS: 4 days   Norman Bier 04/26/2015, 8:36 AM

## 2015-04-27 DIAGNOSIS — I10 Essential (primary) hypertension: Secondary | ICD-10-CM | POA: Diagnosis present

## 2015-04-27 DIAGNOSIS — I6930 Unspecified sequelae of cerebral infarction: Secondary | ICD-10-CM

## 2015-04-27 LAB — CBC
HCT: 38 % (ref 36.0–46.0)
Hemoglobin: 11.6 g/dL — ABNORMAL LOW (ref 12.0–15.0)
MCH: 26.4 pg (ref 26.0–34.0)
MCHC: 30.5 g/dL (ref 30.0–36.0)
MCV: 86.6 fL (ref 78.0–100.0)
Platelets: 334 10*3/uL (ref 150–400)
RBC: 4.39 MIL/uL (ref 3.87–5.11)
RDW: 15.2 % (ref 11.5–15.5)
WBC: 9 10*3/uL (ref 4.0–10.5)

## 2015-04-27 LAB — BASIC METABOLIC PANEL
ANION GAP: 8 (ref 5–15)
BUN: 15 mg/dL (ref 6–20)
CHLORIDE: 103 mmol/L (ref 101–111)
CO2: 29 mmol/L (ref 22–32)
Calcium: 8.9 mg/dL (ref 8.9–10.3)
Creatinine, Ser: 1.16 mg/dL — ABNORMAL HIGH (ref 0.44–1.00)
GFR calc non Af Amer: 41 mL/min — ABNORMAL LOW (ref >60)
GFR, EST AFRICAN AMERICAN: 47 mL/min — AB (ref >60)
Glucose, Bld: 93 mg/dL (ref 65–99)
Potassium: 3.8 mmol/L (ref 3.5–5.1)
Sodium: 140 mmol/L (ref 135–145)

## 2015-04-27 MED ORDER — AMLODIPINE BESYLATE 5 MG PO TABS: 5.0000 mg | ORAL_TABLET | Freq: Two times a day (BID) | ORAL | Status: DC

## 2015-04-27 NOTE — Discharge Summary (Signed)
Physician Discharge Summary  Patient ID: Kristie Morris MRN: [id number] DOB/AGE: 10/27/1924 81 y.o. Primary Care Physician:Vaudine Dutan L, MD Admit date: 04/22/2015 Discharge date: 04/27/2015    Discharge Diagnoses:   Active Problems:   COPD (chronic obstructive pulmonary disease)   Abnormality of gait   Recurrent falls   Abdominal pain   SBO (small bowel obstruction)   Essential hypertension   Personal history of stroke with current residual effects     Medication List    TAKE these medications        albuterol 108 (90 BASE) MCG/ACT inhaler  Commonly known as:  PROVENTIL HFA;VENTOLIN HFA  Inhale 2 puffs into the lungs every 6 (six) hours as needed for wheezing or shortness of breath.     ALIGN PO  Take 1 capsule by mouth daily.     ALPRAZolam 0.25 MG tablet  Commonly known as:  XANAX  Take 0.25 mg by mouth 2 (two) times daily as needed for anxiety.     amLODipine 5 MG tablet  Commonly known as:  NORVASC  Take 1 tablet (5 mg total) by mouth 2 (two) times daily. Take 5 mg AM and 5 mg PM     aspirin EC 81 MG tablet  Take 81 mg by mouth daily.     Cyanocobalamin 2500 MCG Chew  Chew 1 tablet by mouth daily.     dicyclomine 20 MG tablet  Commonly known as:  BENTYL  Take 20 mg by mouth daily.     esomeprazole 20 MG capsule  Commonly known as:  NEXIUM  Take 20 mg by mouth daily at 12 noon.     fexofenadine 180 MG tablet  Commonly known as:  ALLEGRA  Take 180 mg by mouth daily.     Fluticasone-Salmeterol 250-50 MCG/DOSE Aepb  Commonly known as:  ADVAIR  Inhale 1 puff into the lungs daily.     ibuprofen 200 MG tablet  Commonly known as:  ADVIL,MOTRIN  Take 200 mg by mouth every 6 (six) hours as needed for mild pain or moderate pain.     PRESERVISION AREDS PO  Take 1 tablet by mouth daily.     topiramate 25 MG tablet  Commonly known as:  TOPAMAX  Take 1 tablet (25 mg total) by mouth at bedtime.        Discharged Condition: Improved    Consults:  Gen. surgery  Significant Diagnostic Studies: Dg Chest 2 View  04/22/2015   CLINICAL DATA:  Nausea and vomiting dating infant today, hypoxia, history hypertension, asthma, Crohn's disease, COPD, coronary artery disease, CHF, breast cancer  EXAM: CHEST  2 VIEW  COMPARISON:  02/04/2015  FINDINGS: Nasogastric tube extends to inferior mediastinum with proximal side-port at the mid chest; recommend advancing tube 15 cm.  Enlargement of cardiac silhouette.  Atherosclerotic calcification aorta.  Emphysematous and bronchitic changes with bibasilar atelectasis.  No definite acute infiltrate, pleural effusion or pneumothorax.  Underpenetration at retrocardiac LEFT lower lobe.  Bones demineralized.  IMPRESSION: Tip of nasogastric tube within esophagus; recommend advancing tube 15 cm.  COPD changes with bibasilar atelectasis.  Findings called to [place] in ED on 04/22/2015 at 2025 hours.   Electronically Signed   By: Lavonia Dana M.D.   On: 04/22/2015 20:25   Ct Abdomen Pelvis W Contrast  04/22/2015   CLINICAL DATA:  Generalized abdominal pain.  EXAM: CT ABDOMEN AND PELVIS WITH CONTRAST  TECHNIQUE: Multidetector CT imaging of the abdomen and pelvis was performed using the standard protocol following  bolus administration of intravenous contrast.  CONTRAST:  36mL OMNIPAQUE IOHEXOL 300 MG/ML SOLN, 81mL OMNIPAQUE IOHEXOL 300 MG/ML SOLN  COMPARISON:  CT scan of May 24, 2012.  FINDINGS: Multilevel degenerative disc disease is noted in the lower lumbar spine. Visualized lung bases appear normal.  Mild distention of gallbladder is noted without inflammation or cholelithiasis. The liver, spleen and pancreas appear normal. Adrenal glands appear normal. Severe left renal atrophy is noted. Mild right hydronephrosis and proximal ureteral dilatation is noted without obstructing calculus. Distal right ureter appears normal in caliber. Urinary bladder appears normal. Fat containing ventral hernia is noted in the pelvis. Small  periumbilical hernia is noted which contains a portion of transverse colon, but does not result in obstruction. Atherosclerosis of abdominal aorta is noted without aneurysm formation.  Sigmoid diverticulosis is noted without inflammation. There appears to be distal small bowel obstruction with transition zone seen in the left lower quadrant of the abdomen anteriorly best seen on image number 55 of series 2. The more distal ileum appears to be normal in caliber. Status post hysterectomy. No significant adenopathy is noted.  IMPRESSION: Sigmoid diverticulosis is noted without inflammation.  Atherosclerosis of abdominal aorta is noted without aneurysm formation.  Severe left renal atrophy is noted.  Mild right hydronephrosis and proximal ureteral dilatation is noted without obstructing calculus. Possible distal ureteral stricture cannot be excluded.  Moderate size fat containing ventral hernia is noted in the pelvis.  Small periumbilical hernia is noted which contains a loop of small bowel, but does not result in obstruction.  Mild distention of gallbladder is noted without cholelithiasis or surrounding inflammation.  Probable distal small bowel obstruction is noted with transition zone seen in the left lower quadrant of the abdomen, which potentially may be due to adhesion.   Electronically Signed   By: Marijo Conception, M.D.   On: 04/22/2015 17:00   Dg Epidurography  04/16/2015   CLINICAL DATA:  Lumbosacral spondylosis without myelopathy. Displacement of the L2-3, L3-4, and L4 lumbar nerve roots. Left lower extremity radiculitis. Low back pain extending to the right.  FLUOROSCOPY TIME:  0.192 Gy*cm2  PROCEDURE: The procedure, risks, benefits, and alternatives were explained to the patient. Questions regarding the procedure were encouraged and answered. The patient understands and consents to the procedure.  LUMBAR EPIDURAL INJECTION:  An interlaminar approach was performed on left at L2-3. The overlying skin was  cleansed and anesthetized. A 20 gauge Crawford epidural needle was advanced using loss-of-resistance technique.  DIAGNOSTIC EPIDURAL INJECTION:  Injection of Omnipaque 180 shows a good epidural pattern with spread above and below the level of needle placement, primarily on the left no vascular opacification is seen.  THERAPEUTIC EPIDURAL INJECTION:  120 Mg of Depo-Medrol mixed with 3 mL 1% lidocaine were instilled. The procedure was well-tolerated, and the patient was discharged thirty minutes following the injection in good condition.  COMPLICATIONS: None  IMPRESSION: Technically successful epidural injection on the left L2-3 # 1   Electronically Signed   By: San Morelle M.D.   On: 04/16/2015 11:55   Dg Chest Port 1 View  04/23/2015   CLINICAL DATA:  Follow-up small bowel obstruction  EXAM: PORTABLE CHEST - 1 VIEW  COMPARISON:  PA and lateral chest x-ray of April 22, 2015  FINDINGS: The right lung is well-expanded and clear. On the left there is mild increased density at the lung base which is stable. The cardiac silhouette is top-normal in size but stable. The pulmonary vascularity is normal. There  are surgical clips in the axillary regions bilaterally. The esophagogastric tube tip projects below the GE junction. A KUB film is recommended.  IMPRESSION: Allowing for patient rotation there has not been significant interval change since yesterday's study. The esophagogastric tube tip appears to lie below the GE junction but it is not well demonstrated. A lower abdominal lower chest-upper abdominal film is recommended.   Electronically Signed   By: David  Martinique M.D.   On: 04/23/2015 09:08   Dg Knee Complete 4 Views Left  04/11/2015   CLINICAL DATA:  Fall.  Pain.  Initial evaluation.  EXAM: LEFT KNEE - COMPLETE 4+ VIEW  COMPARISON:  None.  FINDINGS: Tricompartment degenerative change present. Degenerative change particularly prominent about the medial compartment. No evidence of fracture or dislocation.   IMPRESSION: No acute bony or joint abnormality. Tricompartment degenerative change, degenerative change particularly prominent about the medial compartment.   Electronically Signed   By: Marcello Moores  Register   On: 04/11/2015 16:16   Dg Abd Portable 1v  04/24/2015   CLINICAL DATA:  Small-bowel obstruction  EXAM: PORTABLE ABDOMEN - 1 VIEW  COMPARISON:  04/23/2015, CT 04/22/2015  FINDINGS: Dilated small bowel obstruction compatible with small bowel obstruction. Mild progression of small bowel dilatation. Colon remains decompressed with contrast in the right colon.  IMPRESSION: Small-bowel obstruction with mild progression.   Electronically Signed   By: Franchot Gallo M.D.   On: 04/24/2015 09:42   Dg Abd Portable 1v  04/23/2015   CLINICAL DATA:  Nasogastric tube placement  EXAM: PORTABLE ABDOMEN - 1 VIEW  COMPARISON:  Portable exam 1228 hours compared to CT abdomen and pelvis 04/22/2015  FINDINGS: Nasogastric tube coiled in proximal stomach with tip projecting over proximal antrum.  Dilated small bowel loops consistent with paucity of gas within the colon compatible with small bowel obstruction.  No definite bowel wall thickening.  Retained contrast within a few dilated small bowel loops.  Bones demineralized.  Question mild levoconvex scoliosis.  IMPRESSION: Small bowel obstruction.  Nasogastric tube coiled in proximal stomach with tip projecting over proximal antrum.   Electronically Signed   By: Lavonia Dana M.D.   On: 04/23/2015 12:45    Lab Results: Basic Metabolic Panel:  Recent Labs  04/25/15 0535 04/27/15 0604  NA 138 140  K 3.9 3.8  CL 102 103  CO2 28 29  GLUCOSE 107* 93  BUN 26* 15  CREATININE 1.01* 1.16*  CALCIUM 8.5* 8.9   Liver Function Tests: No results for input(s): AST, ALT, ALKPHOS, BILITOT, PROT, ALBUMIN in the last 72 hours.   CBC:  Recent Labs  04/27/15 0604  WBC 9.0  HGB 11.6*  HCT 38.0  MCV 86.6  PLT 334    No results found for this or any previous visit (from  the past 240 hour(s)).   Hospital Course: This is an 81 year old who has had multiple medical problems and was at home in her usual state of poor health when she developed abdominal pain nausea and vomiting. She has what appears to be partial small bowel obstruction. She was given anti-emetics had nasogastric tube placed and improved. After about 24-36 hours she developed large amounts of relatively loose stool. She had general surgery consultation was felt that she did not need to have surgery. She continued to improve over the next 24 hours or so still had some abdominal pain was able to tolerate soft diet and ready for discharge.  Discharge Exam: Blood pressure 113/44, pulse 74, temperature 97.6 F (36.4  C), temperature source Oral, resp. rate 18, height 5\' 3"  (1.6 m), weight 87.8 kg (193 lb 9 oz), SpO2 91 %. She is awake and alert. Her abdomen is soft.  Disposition: Home with home health services      Discharge Instructions    Face-to-face encounter (required for Medicare/Medicaid patients)    Complete by:  As directed   I Krisandra Bueno L certify that this patient is under my care and that I, or a nurse practitioner or physician's assistant working with me, had a face-to-face encounter that meets the physician face-to-face encounter requirements with this patient on 04/27/2015. The encounter with the patient was in whole, or in part for the following medical condition(s) which is the primary reason for home health care (List medical condition): small bowel obstruction  The encounter with the patient was in whole, or in part, for the following medical condition, which is the primary reason for home health care:  small bowel obstruction  I certify that, based on my findings, the following services are medically necessary home health services:   Nursing Physical therapy Speech language pathology    Reason for Medically Necessary Home Health Services:  Skilled Nursing- Change/Decline in Patient  Status  My clinical findings support the need for the above services:  Unable to leave home safely without assistance and/or assistive device  Further, I certify that my clinical findings support that this patient is homebound due to:  Unable to leave home safely without assistance     Home Health    Complete by:  As directed   To provide the following care/treatments:   PT RN SLP             Follow-up Information    Follow up with [facility].   Contact information:   [place] [phone]       Signed: Alonza Bogus   04/27/2015, 9:58 AM

## 2015-04-27 NOTE — Progress Notes (Signed)
Subjective: She says she feels okay and wants to go home. She has been having some loose stools but has been receiving laxatives. She does not appear to be toxic at all.  Objective: Vital signs in last 24 hours: Temp:  [97.6 F (36.4 C)-98.3 F (36.8 C)] 97.6 F (36.4 C) (07/23 0400) Pulse Rate:  [74-100] 74 (07/23 0400) Resp:  [18-20] 18 (07/23 0400) BP: (113-144)/(44-79) 113/44 mmHg (07/23 0400) SpO2:  [91 %-96 %] 91 % (07/23 0736) Weight:  [87.8 kg (193 lb 9 oz)] 87.8 kg (193 lb 9 oz) (07/23 0600) Weight change: -0.2 kg (-7.1 oz) Last BM Date: 04/26/15  Intake/Output from previous day: 07/22 0701 - 07/23 0700 In: 760 [P.O.:760] Out: -   PHYSICAL EXAM General appearance: alert, cooperative and no distress Resp: clear to auscultation bilaterally Cardio: regular rate and rhythm, S1, S2 normal, no murmur, click, rub or gallop GI: soft, non-tender; bowel sounds normal; no masses,  no organomegaly Extremities: extremities normal, atraumatic, no cyanosis or edema  Lab Results:  Results for orders placed or performed during the hospital encounter of 04/22/15 (from the past 48 hour(s))  CBC     Status: Abnormal   Collection Time: 04/27/15  6:04 AM  Result Value Ref Range   WBC 9.0 4.0 - 10.5 K/uL   RBC 4.39 3.87 - 5.11 MIL/uL   Hemoglobin 11.6 (L) 12.0 - 15.0 g/dL   HCT 38.0 36.0 - 46.0 %   MCV 86.6 78.0 - 100.0 fL   MCH 26.4 26.0 - 34.0 pg   MCHC 30.5 30.0 - 36.0 g/dL   RDW 15.2 11.5 - 15.5 %   Platelets 334 150 - 400 K/uL  Basic metabolic panel     Status: Abnormal   Collection Time: 04/27/15  6:04 AM  Result Value Ref Range   Sodium 140 135 - 145 mmol/L   Potassium 3.8 3.5 - 5.1 mmol/L   Chloride 103 101 - 111 mmol/L   CO2 29 22 - 32 mmol/L   Glucose, Bld 93 65 - 99 mg/dL   BUN 15 6 - 20 mg/dL   Creatinine, Ser 1.16 (H) 0.44 - 1.00 mg/dL   Calcium 8.9 8.9 - 10.3 mg/dL   GFR calc non Af Amer 41 (L) >60 mL/min   GFR calc Af Amer 47 (L) >60 mL/min    Comment:  (NOTE) The eGFR has been calculated using the CKD EPI equation. This calculation has not been validated in all clinical situations. eGFR's persistently <60 mL/min signify possible Chronic Kidney Disease.    Anion gap 8 5 - 15    ABGS No results for input(s): PHART, PO2ART, TCO2, HCO3 in the last 72 hours.  Invalid input(s): PCO2 CULTURES No results found for this or any previous visit (from the past 240 hour(s)). Studies/Results: No results found.  Medications:  Prior to Admission:  Prescriptions prior to admission  Medication Sig Dispense Refill Last Dose  . albuterol (PROVENTIL HFA;VENTOLIN HFA) 108 (90 BASE) MCG/ACT inhaler Inhale 2 puffs into the lungs every 6 (six) hours as needed for wheezing or shortness of breath.   unknown  . ALPRAZolam (XANAX) 0.25 MG tablet Take 0.25 mg by mouth 2 (two) times daily as needed for anxiety.   unknown  . aspirin EC 81 MG tablet Take 81 mg by mouth daily.   04/21/2015 at Unknown time  . Cyanocobalamin 2500 MCG CHEW Chew 1 tablet by mouth daily.   04/21/2015 at Unknown time  . dicyclomine (BENTYL) 20 MG tablet  Take 20 mg by mouth daily.    04/21/2015 at Unknown time  . esomeprazole (NEXIUM) 20 MG capsule Take 20 mg by mouth daily at 12 noon.   04/21/2015 at Unknown time  . fexofenadine (ALLEGRA) 180 MG tablet Take 180 mg by mouth daily.   04/21/2015 at Unknown time  . Fluticasone-Salmeterol (ADVAIR) 250-50 MCG/DOSE AEPB Inhale 1 puff into the lungs daily.    04/21/2015 at Unknown time  . ibuprofen (ADVIL,MOTRIN) 200 MG tablet Take 200 mg by mouth every 6 (six) hours as needed for mild pain or moderate pain.    04/22/2015 at Unknown time  . Multiple Vitamins-Minerals (PRESERVISION AREDS PO) Take 1 tablet by mouth daily.   04/21/2015 at Unknown time  . Probiotic Product (ALIGN PO) Take 1 capsule by mouth daily.   04/21/2015 at Unknown time  . topiramate (TOPAMAX) 25 MG tablet Take 1 tablet (25 mg total) by mouth at bedtime. 30 tablet 2 04/21/2015 at Unknown  time  . [DISCONTINUED] amLODipine (NORVASC) 5 MG tablet Take 5 mg AM and 5 mg PM (Patient taking differently: Take 5 mg by mouth 2 (two) times daily. Take 5 mg AM and 5 mg PM) 180 tablet 3 04/21/2015 at Unknown time   Scheduled: . amLODipine  5 mg Oral BID  . bisacodyl  10 mg Rectal BID  . cefTRIAXone (ROCEPHIN)  IV  1 g Intravenous Q24H  . heparin subcutaneous  5,000 Units Subcutaneous 3 times per day  . mometasone-formoterol  2 puff Inhalation BID  . pantoprazole  40 mg Oral Daily  . sodium chloride  3 mL Intravenous Q12H   Continuous:  ESL:[id number], albuterol, diphenhydrAMINE, HYDROmorphone (DILAUDID) injection, ondansetron (ZOFRAN) IV  Assesment: She has history of small bowel obstruction. She has had abdominal pain from that and appears to have cleared the obstruction.  She is having loose stools and I think is multifactorial.  She has abnormality of gait and multiple falls at home.  She has hypertension which is well controlled  She has personal history of stroke  She has asthma/COPD which is stable  She has chronic kidney disease stage III Active Problems:   Abnormality of gait   Recurrent falls   Abdominal pain   SBO (small bowel obstruction)    Plan: I'm going to obtain C. difficile. I don't think it's likely that she has that but she has been on Rocephin so I think we need to try to be sure about that. I do think she is ready to go home. If she can obtain stool before she is discharged we can do this as an outpatient because she is going to have home health services.    LOS: 5 days   Phillippa Straub L 04/27/2015, 9:47 AM

## 2015-04-27 NOTE — Progress Notes (Signed)
Patient with orders to be discharge home. Discharge instructions given, patient verbalized understanding. Patient stable. Patient left with family in private vehicle.  

## 2015-04-27 NOTE — Progress Notes (Signed)
Patient refused IV rocephin today. She states she can't take anymore antibiotics because they make her have diarrhea and will cause her IBS to flare up. Education was given. Will continue to monitor.

## 2015-04-28 DIAGNOSIS — S92902D Unspecified fracture of left foot, subsequent encounter for fracture with routine healing: Secondary | ICD-10-CM | POA: Diagnosis not present

## 2015-04-28 DIAGNOSIS — M545 Low back pain: Secondary | ICD-10-CM | POA: Diagnosis not present

## 2015-04-28 DIAGNOSIS — Z853 Personal history of malignant neoplasm of breast: Secondary | ICD-10-CM | POA: Diagnosis not present

## 2015-04-28 DIAGNOSIS — R2689 Other abnormalities of gait and mobility: Secondary | ICD-10-CM | POA: Diagnosis not present

## 2015-04-28 DIAGNOSIS — R291 Meningismus: Secondary | ICD-10-CM | POA: Diagnosis not present

## 2015-04-28 DIAGNOSIS — J449 Chronic obstructive pulmonary disease, unspecified: Secondary | ICD-10-CM | POA: Diagnosis not present

## 2015-04-28 DIAGNOSIS — R296 Repeated falls: Secondary | ICD-10-CM | POA: Diagnosis not present

## 2015-04-28 DIAGNOSIS — I951 Orthostatic hypotension: Secondary | ICD-10-CM | POA: Diagnosis not present

## 2015-04-28 DIAGNOSIS — M1990 Unspecified osteoarthritis, unspecified site: Secondary | ICD-10-CM | POA: Diagnosis not present

## 2015-04-29 DIAGNOSIS — R296 Repeated falls: Secondary | ICD-10-CM | POA: Diagnosis not present

## 2015-04-29 DIAGNOSIS — J449 Chronic obstructive pulmonary disease, unspecified: Secondary | ICD-10-CM | POA: Diagnosis not present

## 2015-04-29 DIAGNOSIS — R2689 Other abnormalities of gait and mobility: Secondary | ICD-10-CM | POA: Diagnosis not present

## 2015-04-29 DIAGNOSIS — R291 Meningismus: Secondary | ICD-10-CM | POA: Diagnosis not present

## 2015-04-29 DIAGNOSIS — S92902D Unspecified fracture of left foot, subsequent encounter for fracture with routine healing: Secondary | ICD-10-CM | POA: Diagnosis not present

## 2015-04-29 DIAGNOSIS — M1990 Unspecified osteoarthritis, unspecified site: Secondary | ICD-10-CM | POA: Diagnosis not present

## 2015-04-29 DIAGNOSIS — M545 Low back pain: Secondary | ICD-10-CM | POA: Diagnosis not present

## 2015-04-29 DIAGNOSIS — Z853 Personal history of malignant neoplasm of breast: Secondary | ICD-10-CM | POA: Diagnosis not present

## 2015-04-29 DIAGNOSIS — I951 Orthostatic hypotension: Secondary | ICD-10-CM | POA: Diagnosis not present

## 2015-04-30 DIAGNOSIS — R194 Change in bowel habit: Secondary | ICD-10-CM | POA: Diagnosis not present

## 2015-04-30 DIAGNOSIS — K5669 Other intestinal obstruction: Secondary | ICD-10-CM | POA: Diagnosis not present

## 2015-04-30 NOTE — Telephone Encounter (Signed)
ERROR

## 2015-05-01 DIAGNOSIS — Z853 Personal history of malignant neoplasm of breast: Secondary | ICD-10-CM | POA: Diagnosis not present

## 2015-05-01 DIAGNOSIS — M1990 Unspecified osteoarthritis, unspecified site: Secondary | ICD-10-CM | POA: Diagnosis not present

## 2015-05-01 DIAGNOSIS — M545 Low back pain: Secondary | ICD-10-CM | POA: Diagnosis not present

## 2015-05-01 DIAGNOSIS — S92902D Unspecified fracture of left foot, subsequent encounter for fracture with routine healing: Secondary | ICD-10-CM | POA: Diagnosis not present

## 2015-05-01 DIAGNOSIS — I951 Orthostatic hypotension: Secondary | ICD-10-CM | POA: Diagnosis not present

## 2015-05-01 DIAGNOSIS — R2689 Other abnormalities of gait and mobility: Secondary | ICD-10-CM | POA: Diagnosis not present

## 2015-05-01 DIAGNOSIS — R296 Repeated falls: Secondary | ICD-10-CM | POA: Diagnosis not present

## 2015-05-01 DIAGNOSIS — J449 Chronic obstructive pulmonary disease, unspecified: Secondary | ICD-10-CM | POA: Diagnosis not present

## 2015-05-01 DIAGNOSIS — R291 Meningismus: Secondary | ICD-10-CM | POA: Diagnosis not present

## 2015-05-02 DIAGNOSIS — R2689 Other abnormalities of gait and mobility: Secondary | ICD-10-CM | POA: Diagnosis not present

## 2015-05-02 DIAGNOSIS — R291 Meningismus: Secondary | ICD-10-CM | POA: Diagnosis not present

## 2015-05-02 DIAGNOSIS — J449 Chronic obstructive pulmonary disease, unspecified: Secondary | ICD-10-CM | POA: Diagnosis not present

## 2015-05-02 DIAGNOSIS — R296 Repeated falls: Secondary | ICD-10-CM | POA: Diagnosis not present

## 2015-05-02 DIAGNOSIS — I951 Orthostatic hypotension: Secondary | ICD-10-CM | POA: Diagnosis not present

## 2015-05-02 DIAGNOSIS — S92902D Unspecified fracture of left foot, subsequent encounter for fracture with routine healing: Secondary | ICD-10-CM | POA: Diagnosis not present

## 2015-05-02 DIAGNOSIS — Z853 Personal history of malignant neoplasm of breast: Secondary | ICD-10-CM | POA: Diagnosis not present

## 2015-05-02 DIAGNOSIS — M1990 Unspecified osteoarthritis, unspecified site: Secondary | ICD-10-CM | POA: Diagnosis not present

## 2015-05-02 DIAGNOSIS — M545 Low back pain: Secondary | ICD-10-CM | POA: Diagnosis not present

## 2015-05-06 DIAGNOSIS — M545 Low back pain: Secondary | ICD-10-CM | POA: Diagnosis not present

## 2015-05-06 DIAGNOSIS — R2689 Other abnormalities of gait and mobility: Secondary | ICD-10-CM | POA: Diagnosis not present

## 2015-05-06 DIAGNOSIS — J449 Chronic obstructive pulmonary disease, unspecified: Secondary | ICD-10-CM | POA: Diagnosis not present

## 2015-05-06 DIAGNOSIS — S92902D Unspecified fracture of left foot, subsequent encounter for fracture with routine healing: Secondary | ICD-10-CM | POA: Diagnosis not present

## 2015-05-06 DIAGNOSIS — R291 Meningismus: Secondary | ICD-10-CM | POA: Diagnosis not present

## 2015-05-06 DIAGNOSIS — R296 Repeated falls: Secondary | ICD-10-CM | POA: Diagnosis not present

## 2015-05-06 DIAGNOSIS — I951 Orthostatic hypotension: Secondary | ICD-10-CM | POA: Diagnosis not present

## 2015-05-06 DIAGNOSIS — Z853 Personal history of malignant neoplasm of breast: Secondary | ICD-10-CM | POA: Diagnosis not present

## 2015-05-06 DIAGNOSIS — M1990 Unspecified osteoarthritis, unspecified site: Secondary | ICD-10-CM | POA: Diagnosis not present

## 2015-05-07 DIAGNOSIS — J449 Chronic obstructive pulmonary disease, unspecified: Secondary | ICD-10-CM | POA: Diagnosis not present

## 2015-05-07 DIAGNOSIS — Z853 Personal history of malignant neoplasm of breast: Secondary | ICD-10-CM | POA: Diagnosis not present

## 2015-05-07 DIAGNOSIS — R291 Meningismus: Secondary | ICD-10-CM | POA: Diagnosis not present

## 2015-05-07 DIAGNOSIS — R296 Repeated falls: Secondary | ICD-10-CM | POA: Diagnosis not present

## 2015-05-07 DIAGNOSIS — M545 Low back pain: Secondary | ICD-10-CM | POA: Diagnosis not present

## 2015-05-07 DIAGNOSIS — I951 Orthostatic hypotension: Secondary | ICD-10-CM | POA: Diagnosis not present

## 2015-05-07 DIAGNOSIS — S92902D Unspecified fracture of left foot, subsequent encounter for fracture with routine healing: Secondary | ICD-10-CM | POA: Diagnosis not present

## 2015-05-07 DIAGNOSIS — M1990 Unspecified osteoarthritis, unspecified site: Secondary | ICD-10-CM | POA: Diagnosis not present

## 2015-05-07 DIAGNOSIS — R2689 Other abnormalities of gait and mobility: Secondary | ICD-10-CM | POA: Diagnosis not present

## 2015-05-08 DIAGNOSIS — J449 Chronic obstructive pulmonary disease, unspecified: Secondary | ICD-10-CM | POA: Diagnosis not present

## 2015-05-08 DIAGNOSIS — M545 Low back pain: Secondary | ICD-10-CM | POA: Diagnosis not present

## 2015-05-08 DIAGNOSIS — R296 Repeated falls: Secondary | ICD-10-CM | POA: Diagnosis not present

## 2015-05-08 DIAGNOSIS — M1990 Unspecified osteoarthritis, unspecified site: Secondary | ICD-10-CM | POA: Diagnosis not present

## 2015-05-08 DIAGNOSIS — R2689 Other abnormalities of gait and mobility: Secondary | ICD-10-CM | POA: Diagnosis not present

## 2015-05-08 DIAGNOSIS — R291 Meningismus: Secondary | ICD-10-CM | POA: Diagnosis not present

## 2015-05-08 DIAGNOSIS — S92902D Unspecified fracture of left foot, subsequent encounter for fracture with routine healing: Secondary | ICD-10-CM | POA: Diagnosis not present

## 2015-05-08 DIAGNOSIS — Z853 Personal history of malignant neoplasm of breast: Secondary | ICD-10-CM | POA: Diagnosis not present

## 2015-05-08 DIAGNOSIS — I951 Orthostatic hypotension: Secondary | ICD-10-CM | POA: Diagnosis not present

## 2015-05-09 DIAGNOSIS — S92902D Unspecified fracture of left foot, subsequent encounter for fracture with routine healing: Secondary | ICD-10-CM | POA: Diagnosis not present

## 2015-05-09 DIAGNOSIS — I951 Orthostatic hypotension: Secondary | ICD-10-CM | POA: Diagnosis not present

## 2015-05-09 DIAGNOSIS — M545 Low back pain: Secondary | ICD-10-CM | POA: Diagnosis not present

## 2015-05-09 DIAGNOSIS — R291 Meningismus: Secondary | ICD-10-CM | POA: Diagnosis not present

## 2015-05-09 DIAGNOSIS — R296 Repeated falls: Secondary | ICD-10-CM | POA: Diagnosis not present

## 2015-05-09 DIAGNOSIS — R2689 Other abnormalities of gait and mobility: Secondary | ICD-10-CM | POA: Diagnosis not present

## 2015-05-09 DIAGNOSIS — Z853 Personal history of malignant neoplasm of breast: Secondary | ICD-10-CM | POA: Diagnosis not present

## 2015-05-09 DIAGNOSIS — M1990 Unspecified osteoarthritis, unspecified site: Secondary | ICD-10-CM | POA: Diagnosis not present

## 2015-05-09 DIAGNOSIS — J449 Chronic obstructive pulmonary disease, unspecified: Secondary | ICD-10-CM | POA: Diagnosis not present

## 2015-05-10 DIAGNOSIS — R296 Repeated falls: Secondary | ICD-10-CM | POA: Diagnosis not present

## 2015-05-10 DIAGNOSIS — S92902D Unspecified fracture of left foot, subsequent encounter for fracture with routine healing: Secondary | ICD-10-CM | POA: Diagnosis not present

## 2015-05-10 DIAGNOSIS — M545 Low back pain: Secondary | ICD-10-CM | POA: Diagnosis not present

## 2015-05-10 DIAGNOSIS — Z853 Personal history of malignant neoplasm of breast: Secondary | ICD-10-CM | POA: Diagnosis not present

## 2015-05-10 DIAGNOSIS — J449 Chronic obstructive pulmonary disease, unspecified: Secondary | ICD-10-CM | POA: Diagnosis not present

## 2015-05-10 DIAGNOSIS — R2689 Other abnormalities of gait and mobility: Secondary | ICD-10-CM | POA: Diagnosis not present

## 2015-05-10 DIAGNOSIS — I951 Orthostatic hypotension: Secondary | ICD-10-CM | POA: Diagnosis not present

## 2015-05-10 DIAGNOSIS — R291 Meningismus: Secondary | ICD-10-CM | POA: Diagnosis not present

## 2015-05-10 DIAGNOSIS — M1990 Unspecified osteoarthritis, unspecified site: Secondary | ICD-10-CM | POA: Diagnosis not present

## 2015-05-13 DIAGNOSIS — S92902D Unspecified fracture of left foot, subsequent encounter for fracture with routine healing: Secondary | ICD-10-CM | POA: Diagnosis not present

## 2015-05-13 DIAGNOSIS — R296 Repeated falls: Secondary | ICD-10-CM | POA: Diagnosis not present

## 2015-05-13 DIAGNOSIS — I951 Orthostatic hypotension: Secondary | ICD-10-CM | POA: Diagnosis not present

## 2015-05-13 DIAGNOSIS — R2689 Other abnormalities of gait and mobility: Secondary | ICD-10-CM | POA: Diagnosis not present

## 2015-05-13 DIAGNOSIS — M1990 Unspecified osteoarthritis, unspecified site: Secondary | ICD-10-CM | POA: Diagnosis not present

## 2015-05-13 DIAGNOSIS — M545 Low back pain: Secondary | ICD-10-CM | POA: Diagnosis not present

## 2015-05-13 DIAGNOSIS — J449 Chronic obstructive pulmonary disease, unspecified: Secondary | ICD-10-CM | POA: Diagnosis not present

## 2015-05-13 DIAGNOSIS — R291 Meningismus: Secondary | ICD-10-CM | POA: Diagnosis not present

## 2015-05-13 DIAGNOSIS — Z853 Personal history of malignant neoplasm of breast: Secondary | ICD-10-CM | POA: Diagnosis not present

## 2015-05-14 DIAGNOSIS — R2689 Other abnormalities of gait and mobility: Secondary | ICD-10-CM | POA: Diagnosis not present

## 2015-05-14 DIAGNOSIS — R291 Meningismus: Secondary | ICD-10-CM | POA: Diagnosis not present

## 2015-05-14 DIAGNOSIS — S92902D Unspecified fracture of left foot, subsequent encounter for fracture with routine healing: Secondary | ICD-10-CM | POA: Diagnosis not present

## 2015-05-14 DIAGNOSIS — Z853 Personal history of malignant neoplasm of breast: Secondary | ICD-10-CM | POA: Diagnosis not present

## 2015-05-14 DIAGNOSIS — I951 Orthostatic hypotension: Secondary | ICD-10-CM | POA: Diagnosis not present

## 2015-05-14 DIAGNOSIS — R296 Repeated falls: Secondary | ICD-10-CM | POA: Diagnosis not present

## 2015-05-14 DIAGNOSIS — M545 Low back pain: Secondary | ICD-10-CM | POA: Diagnosis not present

## 2015-05-14 DIAGNOSIS — J449 Chronic obstructive pulmonary disease, unspecified: Secondary | ICD-10-CM | POA: Diagnosis not present

## 2015-05-14 DIAGNOSIS — M1990 Unspecified osteoarthritis, unspecified site: Secondary | ICD-10-CM | POA: Diagnosis not present

## 2015-05-15 DIAGNOSIS — I951 Orthostatic hypotension: Secondary | ICD-10-CM | POA: Diagnosis not present

## 2015-05-15 DIAGNOSIS — J449 Chronic obstructive pulmonary disease, unspecified: Secondary | ICD-10-CM | POA: Diagnosis not present

## 2015-05-15 DIAGNOSIS — S92902D Unspecified fracture of left foot, subsequent encounter for fracture with routine healing: Secondary | ICD-10-CM | POA: Diagnosis not present

## 2015-05-15 DIAGNOSIS — Z853 Personal history of malignant neoplasm of breast: Secondary | ICD-10-CM | POA: Diagnosis not present

## 2015-05-15 DIAGNOSIS — R296 Repeated falls: Secondary | ICD-10-CM | POA: Diagnosis not present

## 2015-05-15 DIAGNOSIS — M545 Low back pain: Secondary | ICD-10-CM | POA: Diagnosis not present

## 2015-05-15 DIAGNOSIS — M1990 Unspecified osteoarthritis, unspecified site: Secondary | ICD-10-CM | POA: Diagnosis not present

## 2015-05-15 DIAGNOSIS — R291 Meningismus: Secondary | ICD-10-CM | POA: Diagnosis not present

## 2015-05-15 DIAGNOSIS — R2689 Other abnormalities of gait and mobility: Secondary | ICD-10-CM | POA: Diagnosis not present

## 2015-05-16 DIAGNOSIS — R291 Meningismus: Secondary | ICD-10-CM | POA: Diagnosis not present

## 2015-05-16 DIAGNOSIS — Z853 Personal history of malignant neoplasm of breast: Secondary | ICD-10-CM | POA: Diagnosis not present

## 2015-05-16 DIAGNOSIS — M545 Low back pain: Secondary | ICD-10-CM | POA: Diagnosis not present

## 2015-05-16 DIAGNOSIS — M1990 Unspecified osteoarthritis, unspecified site: Secondary | ICD-10-CM | POA: Diagnosis not present

## 2015-05-16 DIAGNOSIS — I951 Orthostatic hypotension: Secondary | ICD-10-CM | POA: Diagnosis not present

## 2015-05-16 DIAGNOSIS — J449 Chronic obstructive pulmonary disease, unspecified: Secondary | ICD-10-CM | POA: Diagnosis not present

## 2015-05-16 DIAGNOSIS — S92902D Unspecified fracture of left foot, subsequent encounter for fracture with routine healing: Secondary | ICD-10-CM | POA: Diagnosis not present

## 2015-05-16 DIAGNOSIS — R2689 Other abnormalities of gait and mobility: Secondary | ICD-10-CM | POA: Diagnosis not present

## 2015-05-16 DIAGNOSIS — R296 Repeated falls: Secondary | ICD-10-CM | POA: Diagnosis not present

## 2015-05-17 DIAGNOSIS — I951 Orthostatic hypotension: Secondary | ICD-10-CM | POA: Diagnosis not present

## 2015-05-17 DIAGNOSIS — J449 Chronic obstructive pulmonary disease, unspecified: Secondary | ICD-10-CM | POA: Diagnosis not present

## 2015-05-17 DIAGNOSIS — R2689 Other abnormalities of gait and mobility: Secondary | ICD-10-CM | POA: Diagnosis not present

## 2015-05-17 DIAGNOSIS — S92902D Unspecified fracture of left foot, subsequent encounter for fracture with routine healing: Secondary | ICD-10-CM | POA: Diagnosis not present

## 2015-05-17 DIAGNOSIS — R291 Meningismus: Secondary | ICD-10-CM | POA: Diagnosis not present

## 2015-05-17 DIAGNOSIS — M1990 Unspecified osteoarthritis, unspecified site: Secondary | ICD-10-CM | POA: Diagnosis not present

## 2015-05-17 DIAGNOSIS — R296 Repeated falls: Secondary | ICD-10-CM | POA: Diagnosis not present

## 2015-05-17 DIAGNOSIS — M545 Low back pain: Secondary | ICD-10-CM | POA: Diagnosis not present

## 2015-05-17 DIAGNOSIS — Z853 Personal history of malignant neoplasm of breast: Secondary | ICD-10-CM | POA: Diagnosis not present

## 2015-05-18 DIAGNOSIS — M545 Low back pain: Secondary | ICD-10-CM | POA: Diagnosis not present

## 2015-05-18 DIAGNOSIS — R2689 Other abnormalities of gait and mobility: Secondary | ICD-10-CM | POA: Diagnosis not present

## 2015-05-18 DIAGNOSIS — I951 Orthostatic hypotension: Secondary | ICD-10-CM | POA: Diagnosis not present

## 2015-05-18 DIAGNOSIS — R296 Repeated falls: Secondary | ICD-10-CM | POA: Diagnosis not present

## 2015-05-20 DIAGNOSIS — R2689 Other abnormalities of gait and mobility: Secondary | ICD-10-CM | POA: Diagnosis not present

## 2015-05-20 DIAGNOSIS — R296 Repeated falls: Secondary | ICD-10-CM | POA: Diagnosis not present

## 2015-05-20 DIAGNOSIS — S92902D Unspecified fracture of left foot, subsequent encounter for fracture with routine healing: Secondary | ICD-10-CM | POA: Diagnosis not present

## 2015-05-20 DIAGNOSIS — Z853 Personal history of malignant neoplasm of breast: Secondary | ICD-10-CM | POA: Diagnosis not present

## 2015-05-20 DIAGNOSIS — M1990 Unspecified osteoarthritis, unspecified site: Secondary | ICD-10-CM | POA: Diagnosis not present

## 2015-05-20 DIAGNOSIS — M545 Low back pain: Secondary | ICD-10-CM | POA: Diagnosis not present

## 2015-05-20 DIAGNOSIS — R291 Meningismus: Secondary | ICD-10-CM | POA: Diagnosis not present

## 2015-05-20 DIAGNOSIS — I951 Orthostatic hypotension: Secondary | ICD-10-CM | POA: Diagnosis not present

## 2015-05-20 DIAGNOSIS — J449 Chronic obstructive pulmonary disease, unspecified: Secondary | ICD-10-CM | POA: Diagnosis not present

## 2015-05-22 DIAGNOSIS — I951 Orthostatic hypotension: Secondary | ICD-10-CM | POA: Diagnosis not present

## 2015-05-22 DIAGNOSIS — S92902D Unspecified fracture of left foot, subsequent encounter for fracture with routine healing: Secondary | ICD-10-CM | POA: Diagnosis not present

## 2015-05-22 DIAGNOSIS — Z853 Personal history of malignant neoplasm of breast: Secondary | ICD-10-CM | POA: Diagnosis not present

## 2015-05-22 DIAGNOSIS — M1990 Unspecified osteoarthritis, unspecified site: Secondary | ICD-10-CM | POA: Diagnosis not present

## 2015-05-22 DIAGNOSIS — R2689 Other abnormalities of gait and mobility: Secondary | ICD-10-CM | POA: Diagnosis not present

## 2015-05-22 DIAGNOSIS — R296 Repeated falls: Secondary | ICD-10-CM | POA: Diagnosis not present

## 2015-05-22 DIAGNOSIS — J449 Chronic obstructive pulmonary disease, unspecified: Secondary | ICD-10-CM | POA: Diagnosis not present

## 2015-05-22 DIAGNOSIS — R291 Meningismus: Secondary | ICD-10-CM | POA: Diagnosis not present

## 2015-05-22 DIAGNOSIS — M545 Low back pain: Secondary | ICD-10-CM | POA: Diagnosis not present

## 2015-05-24 DIAGNOSIS — R2689 Other abnormalities of gait and mobility: Secondary | ICD-10-CM | POA: Diagnosis not present

## 2015-05-24 DIAGNOSIS — I951 Orthostatic hypotension: Secondary | ICD-10-CM | POA: Diagnosis not present

## 2015-05-24 DIAGNOSIS — R296 Repeated falls: Secondary | ICD-10-CM | POA: Diagnosis not present

## 2015-05-24 DIAGNOSIS — M1990 Unspecified osteoarthritis, unspecified site: Secondary | ICD-10-CM | POA: Diagnosis not present

## 2015-05-24 DIAGNOSIS — J449 Chronic obstructive pulmonary disease, unspecified: Secondary | ICD-10-CM | POA: Diagnosis not present

## 2015-05-24 DIAGNOSIS — S92902D Unspecified fracture of left foot, subsequent encounter for fracture with routine healing: Secondary | ICD-10-CM | POA: Diagnosis not present

## 2015-05-24 DIAGNOSIS — R291 Meningismus: Secondary | ICD-10-CM | POA: Diagnosis not present

## 2015-05-24 DIAGNOSIS — M545 Low back pain: Secondary | ICD-10-CM | POA: Diagnosis not present

## 2015-05-24 DIAGNOSIS — Z853 Personal history of malignant neoplasm of breast: Secondary | ICD-10-CM | POA: Diagnosis not present

## 2015-05-27 DIAGNOSIS — J449 Chronic obstructive pulmonary disease, unspecified: Secondary | ICD-10-CM | POA: Diagnosis not present

## 2015-05-27 DIAGNOSIS — R2689 Other abnormalities of gait and mobility: Secondary | ICD-10-CM | POA: Diagnosis not present

## 2015-05-27 DIAGNOSIS — I951 Orthostatic hypotension: Secondary | ICD-10-CM | POA: Diagnosis not present

## 2015-05-27 DIAGNOSIS — R291 Meningismus: Secondary | ICD-10-CM | POA: Diagnosis not present

## 2015-05-27 DIAGNOSIS — R296 Repeated falls: Secondary | ICD-10-CM | POA: Diagnosis not present

## 2015-05-27 DIAGNOSIS — M1990 Unspecified osteoarthritis, unspecified site: Secondary | ICD-10-CM | POA: Diagnosis not present

## 2015-05-27 DIAGNOSIS — S92902D Unspecified fracture of left foot, subsequent encounter for fracture with routine healing: Secondary | ICD-10-CM | POA: Diagnosis not present

## 2015-05-27 DIAGNOSIS — M545 Low back pain: Secondary | ICD-10-CM | POA: Diagnosis not present

## 2015-05-27 DIAGNOSIS — Z853 Personal history of malignant neoplasm of breast: Secondary | ICD-10-CM | POA: Diagnosis not present

## 2015-05-28 DIAGNOSIS — I951 Orthostatic hypotension: Secondary | ICD-10-CM | POA: Diagnosis not present

## 2015-05-28 DIAGNOSIS — R291 Meningismus: Secondary | ICD-10-CM | POA: Diagnosis not present

## 2015-05-28 DIAGNOSIS — M1990 Unspecified osteoarthritis, unspecified site: Secondary | ICD-10-CM | POA: Diagnosis not present

## 2015-05-28 DIAGNOSIS — M545 Low back pain: Secondary | ICD-10-CM | POA: Diagnosis not present

## 2015-05-28 DIAGNOSIS — S92902D Unspecified fracture of left foot, subsequent encounter for fracture with routine healing: Secondary | ICD-10-CM | POA: Diagnosis not present

## 2015-05-28 DIAGNOSIS — R2689 Other abnormalities of gait and mobility: Secondary | ICD-10-CM | POA: Diagnosis not present

## 2015-05-28 DIAGNOSIS — Z853 Personal history of malignant neoplasm of breast: Secondary | ICD-10-CM | POA: Diagnosis not present

## 2015-05-28 DIAGNOSIS — J449 Chronic obstructive pulmonary disease, unspecified: Secondary | ICD-10-CM | POA: Diagnosis not present

## 2015-05-28 DIAGNOSIS — R296 Repeated falls: Secondary | ICD-10-CM | POA: Diagnosis not present

## 2015-05-29 ENCOUNTER — Encounter: Payer: Self-pay | Admitting: Neurology

## 2015-05-29 ENCOUNTER — Ambulatory Visit (INDEPENDENT_AMBULATORY_CARE_PROVIDER_SITE_OTHER): Payer: Commercial Managed Care - HMO | Admitting: Neurology

## 2015-05-29 VITALS — BP 153/86 | HR 90 | Ht 63.0 in

## 2015-05-29 DIAGNOSIS — M5431 Sciatica, right side: Secondary | ICD-10-CM

## 2015-05-29 DIAGNOSIS — R269 Unspecified abnormalities of gait and mobility: Secondary | ICD-10-CM

## 2015-05-29 DIAGNOSIS — R51 Headache: Secondary | ICD-10-CM | POA: Diagnosis not present

## 2015-05-29 DIAGNOSIS — R519 Headache, unspecified: Secondary | ICD-10-CM

## 2015-05-29 HISTORY — DX: Sciatica, right side: M54.31

## 2015-05-29 MED ORDER — GABAPENTIN 100 MG PO CAPS: 100.0000 mg | ORAL_CAPSULE | Freq: Three times a day (TID) | ORAL | Status: DC

## 2015-05-29 NOTE — Progress Notes (Signed)
Reason for visit: Back pain  Kristie Morris is an 81 y.o. female  History of present illness:  Kristie Morris is an 81 year old right-handed white female with a history of a severe gait disorder. She has chronic low back pain, and over the last 1 week prior to this evaluation, she has developed significant right-sided sciatica pain going into the right hip and down to the foot. The patient has increased pain with weightbearing, somewhat better with sitting or lying down. She has undergone MRI of the lumbar spine recently showing evidence of possible impingement of the left L5 nerve root, and of the L3 and L4 nerve roots bilaterally. The patient has gotten an epidural steroid injection in June 2016, she does not recall whether this was beneficial or not, but her family indicates that she did get some benefit. The patient has not had any overt falls, but she will oftentimes plop down into her chair, and she wonders if she may have injured her back doing this. She cannot take pain medication such as hydrocodone, as this resulted in severe constipation and bowel obstruction that necessitated a hospitalization. She is not sleeping well currently. She has urinary frequency, she is followed by Dr. Jeffie Pollock for this. The patient returns for an evaluation.  Past Medical History  Diagnosis Date  . Hypertension   . Asthma   . Crohn disease   . COPD (chronic obstructive pulmonary disease)   . Malignant hypertension 02/02/2014  . Diastolic dysfunction [id number]  . Anemia 02/02/2014    Anemia panel WNL  . CAD (coronary artery disease) 2003    Cath with nonobstructive CAD  . Abnormality of gait 01/08/2015  . Heart murmur   . CHF (congestive heart failure)   . Pneumonia 2014; 2015  . History of blood transfusion     "I had 7; related to packing left inside me when I had a D&C; got blood poisoning"  . GERD (gastroesophageal reflux disease)   . Daily headache     "this last few months" (01/08/2015)  . Migraine    "when I was a young woman" (01/08/2015)  . Stroke 03/2014    "left leg weak; lost of strength in my left side; slight tremors; some speach problems since" (01/08/2015)  . Arthritis     "left knee" (01/08/2015)  . Fibromyalgia   . Chronic lower back pain   . Anxiety   . Breast cancer     "I've had it in both breasts"  . Macular hole, both eyes     "both wet and dry"  . OAB (overactive bladder)   . Small bowel obstruction   . Sciatica of right side 05/29/2015    Past Surgical History  Procedure Laterality Date  . Mastectomy, partial Right 1999  . Back surgery    . Knee arthroscopy Right 2005  . Abdominal hysterectomy  1954  . Ovarian cyst removal  1946  . Cataract extraction w/ intraocular lens implant Right 2008  . Dilation and curettage of uterus  1953    S/P miscarriage  . Hemorrhoid surgery  1958  . Anterior cervical decomp/discectomy fusion  1971    "used bone off of my right hip"  . Abdominal adhesion surgery  1993    from colon  . Breast lumpectomy Left 2006  . Patella fracture surgery Left 2008  . Cataract extraction w/ intraocular lens implant Left 2015  . Foot fracture surgery Left 12/2014  . Fracture surgery    . Cardiac  catheterization  11/2001    Family History  Problem Relation Age of Onset  . Heart attack Mother   . CAD Mother   . Heart attack Sister   . Cancer - Ovarian Sister   . Cancer - Colon Father     Social history:  reports that she has quit smoking. Her smoking use included Cigarettes. She has a 7.5 pack-year smoking history. She has never used smokeless tobacco. She reports that she does not drink alcohol or use illicit drugs.    Allergies  Allergen Reactions  . Zithromax [Azithromycin] Diarrhea    Pt in hosp for 2 weeks   . Morphine And Related   . Celebrex [Celecoxib] Hives  . Ciprofloxacin Hives  . Metronidazole Hives  . Moxifloxacin Hives  . Penicillins Hives  . Polymyxin B Hives  . Sulfa Antibiotics Hives  . Vancomycin Hives  . Vioxx  [Rofecoxib] Hives    Medications:  Prior to Admission medications   Medication Sig Start Date End Date Taking? Authorizing [employer]  albuterol (PROVENTIL HFA;VENTOLIN HFA) 108 (90 BASE) MCG/ACT inhaler Inhale 2 puffs into the lungs every 6 (six) hours as needed for wheezing or shortness of breath.   Yes Historical Destinie Thornsberry, MD  ALPRAZolam (XANAX) 0.25 MG tablet Take 0.25 mg by mouth 2 (two) times daily as needed for anxiety.   Yes Historical Lasasha Brophy, MD  amLODipine (NORVASC) 5 MG tablet Take 1 tablet (5 mg total) by mouth 2 (two) times daily. Take 5 mg AM and 5 mg PM 04/27/15  Yes Sinda Du, MD  aspirin EC 81 MG tablet Take 81 mg by mouth daily.   Yes Historical Boston Cookson, MD  Cyanocobalamin 2500 MCG CHEW Chew 1 tablet by mouth daily.   Yes Historical Djeneba Barsch, MD  dicyclomine (BENTYL) 20 MG tablet Take 20 mg by mouth daily.    Yes Historical Tivis Wherry, MD  esomeprazole (NEXIUM) 20 MG capsule Take 20 mg by mouth daily at 12 noon.   Yes Historical Evangaline Jou, MD  fexofenadine (ALLEGRA) 180 MG tablet Take 180 mg by mouth daily.   Yes Historical Huckleberry Martinson, MD  Fluticasone-Salmeterol (ADVAIR) 250-50 MCG/DOSE AEPB Inhale 1 puff into the lungs daily.    Yes Historical Chester Romero, MD  ibuprofen (ADVIL,MOTRIN) 200 MG tablet Take 200 mg by mouth every 6 (six) hours as needed for mild pain or moderate pain.    Yes Historical Gredmarie Delange, MD  Multiple Vitamins-Minerals (PRESERVISION AREDS PO) Take 1 tablet by mouth daily.   Yes Historical Preslie Depasquale, MD  Probiotic Product (ALIGN PO) Take 1 capsule by mouth daily.   Yes Historical Quran Vasco, MD  topiramate (TOPAMAX) 25 MG tablet Take 1 tablet (25 mg total) by mouth at bedtime. 03/12/15  Yes Kathrynn Ducking, MD    ROS:  Out of a complete 14 system review of symptoms, the patient complains only of the following symptoms, and all other reviewed systems are negative.  Difficulty swallowing Shortness of breath Frequency of urination, urinary urgency Frequent  waking Back pain, walking difficulty Itching  Blood pressure 153/86, pulse 90, height 5\' 3"  (1.6 m).  Physical Exam  General: The patient is alert and cooperative at the time of the examination. The patient is moderately obese.  Neuromuscular: Rotational movement of the right hip does not elicit pain.  Skin: 1+ edema below the knees is noted bilaterally.   Neurologic Exam  Mental status: The patient is alert and oriented x 3 at the time of the examination. The patient has apparent normal recent and remote  memory, with an apparently normal attention span and concentration ability.   Cranial nerves: Facial symmetry is present. Speech is normal, no aphasia or dysarthria is noted. Extraocular movements are full. Visual fields are full.  Motor: The patient has good strength in all 4 extremities.  Sensory examination: Soft touch sensation is symmetric on the face, arms, and legs.  Coordination: The patient has good finger-nose-finger and heel-to-shin bilaterally.  Gait and station: The patient requires assistance with standing, once up, she tends to lean backwards, she cannot ambulate independently. She is able to walk short distances with assistance, wide-based gait is noted. Tandem gait was not attempted. Romberg is positive, the patient goes backwards.  Reflexes: Deep tendon reflexes are symmetric, but are decreased.   Assessment/Plan:  1. Gait disorder  2. Low back pain, right-sided sciatica  The patient is having severe back and right leg pain currently. The patient will be set up for an epidural steroid injection of the low back. She will be placed on low-dose gabapentin, she will follow-up in 3 or 4 months. She is to contact our office if she is not doing well.  Jill Alexanders MD 05/29/2015 7:25 PM  [facility] [address] [place], [place] [address]  Phone [phone] Fax [phone]

## 2015-05-29 NOTE — Patient Instructions (Addendum)
We will set up an epidural steroid injection. We will start gabapentin for the back pain.  Sciatica Sciatica is pain, weakness, numbness, or tingling along the path of the sciatic nerve. The nerve starts in the lower back and runs down the back of each leg. The nerve controls the muscles in the lower leg and in the back of the knee, while also providing sensation to the back of the thigh, lower leg, and the sole of your foot. Sciatica is a symptom of another medical condition. For instance, nerve damage or certain conditions, such as a herniated disk or bone spur on the spine, pinch or put pressure on the sciatic nerve. This causes the pain, weakness, or other sensations normally associated with sciatica. Generally, sciatica only affects one side of the body. CAUSES   Herniated or slipped disc.  Degenerative disk disease.  A pain disorder involving the narrow muscle in the buttocks (piriformis syndrome).  Pelvic injury or fracture.  Pregnancy.  Tumor (rare). SYMPTOMS  Symptoms can vary from mild to very severe. The symptoms usually travel from the low back to the buttocks and down the back of the leg. Symptoms can include:  Mild tingling or dull aches in the lower back, leg, or hip.  Numbness in the back of the calf or sole of the foot.  Burning sensations in the lower back, leg, or hip.  Sharp pains in the lower back, leg, or hip.  Leg weakness.  Severe back pain inhibiting movement. These symptoms may get worse with coughing, sneezing, laughing, or prolonged sitting or standing. Also, being overweight may worsen symptoms. DIAGNOSIS  Your caregiver will perform a physical exam to look for common symptoms of sciatica. He or she may ask you to do certain movements or activities that would trigger sciatic nerve pain. Other tests may be performed to find the cause of the sciatica. These may include:  Blood tests.  X-rays.  Imaging tests, such as an MRI or CT scan. TREATMENT    Treatment is directed at the cause of the sciatic pain. Sometimes, treatment is not necessary and the pain and discomfort goes away on its own. If treatment is needed, your caregiver may suggest:  Over-the-counter medicines to relieve pain.  Prescription medicines, such as anti-inflammatory medicine, muscle relaxants, or narcotics.  Applying heat or ice to the painful area.  Steroid injections to lessen pain, irritation, and inflammation around the nerve.  Reducing activity during periods of pain.  Exercising and stretching to strengthen your abdomen and improve flexibility of your spine. Your caregiver may suggest losing weight if the extra weight makes the back pain worse.  Physical therapy.  Surgery to eliminate what is pressing or pinching the nerve, such as a bone spur or part of a herniated disk. HOME CARE INSTRUCTIONS   Only take over-the-counter or prescription medicines for pain or discomfort as directed by your caregiver.  Apply ice to the affected area for 20 minutes, 3-4 times a day for the first 48-72 hours. Then try heat in the same way.  Exercise, stretch, or perform your usual activities if these do not aggravate your pain.  Attend physical therapy sessions as directed by your caregiver.  Keep all follow-up appointments as directed by your caregiver.  Do not wear high heels or shoes that do not provide proper support.  Check your mattress to see if it is too soft. A firm mattress may lessen your pain and discomfort. SEEK IMMEDIATE MEDICAL CARE IF:   You  lose control of your bowel or bladder (incontinence).  You have increasing weakness in the lower back, pelvis, buttocks, or legs.  You have redness or swelling of your back.  You have a burning sensation when you urinate.  You have pain that gets worse when you lie down or awakens you at night.  Your pain is worse than you have experienced in the past.  Your pain is lasting longer than 4 weeks.  You  are suddenly losing weight without reason. MAKE SURE YOU:  Understand these instructions.  Will watch your condition.  Will get help right away if you are not doing well or get worse. Document Released: 09/15/2001 Document Revised: 03/22/2012 Document Reviewed: 01/31/2012 [facility] Patient Information 2015 [place], [place]. This information is not intended to replace advice given to you by your health care provider. Make sure you discuss any questions you have with your health care provider.

## 2015-05-30 ENCOUNTER — Ambulatory Visit: Payer: Commercial Managed Care - HMO | Admitting: Neurology

## 2015-05-30 DIAGNOSIS — M545 Low back pain: Secondary | ICD-10-CM | POA: Diagnosis not present

## 2015-05-30 DIAGNOSIS — R2689 Other abnormalities of gait and mobility: Secondary | ICD-10-CM | POA: Diagnosis not present

## 2015-05-30 DIAGNOSIS — J449 Chronic obstructive pulmonary disease, unspecified: Secondary | ICD-10-CM | POA: Diagnosis not present

## 2015-05-30 DIAGNOSIS — S92902D Unspecified fracture of left foot, subsequent encounter for fracture with routine healing: Secondary | ICD-10-CM | POA: Diagnosis not present

## 2015-05-30 DIAGNOSIS — M1990 Unspecified osteoarthritis, unspecified site: Secondary | ICD-10-CM | POA: Diagnosis not present

## 2015-05-30 DIAGNOSIS — I951 Orthostatic hypotension: Secondary | ICD-10-CM | POA: Diagnosis not present

## 2015-05-30 DIAGNOSIS — R296 Repeated falls: Secondary | ICD-10-CM | POA: Diagnosis not present

## 2015-05-30 DIAGNOSIS — R291 Meningismus: Secondary | ICD-10-CM | POA: Diagnosis not present

## 2015-05-30 DIAGNOSIS — Z853 Personal history of malignant neoplasm of breast: Secondary | ICD-10-CM | POA: Diagnosis not present

## 2015-05-31 ENCOUNTER — Other Ambulatory Visit: Payer: Self-pay | Admitting: Neurology

## 2015-05-31 DIAGNOSIS — Z853 Personal history of malignant neoplasm of breast: Secondary | ICD-10-CM | POA: Diagnosis not present

## 2015-05-31 DIAGNOSIS — R296 Repeated falls: Secondary | ICD-10-CM | POA: Diagnosis not present

## 2015-05-31 DIAGNOSIS — M545 Low back pain: Secondary | ICD-10-CM | POA: Diagnosis not present

## 2015-05-31 DIAGNOSIS — R2689 Other abnormalities of gait and mobility: Secondary | ICD-10-CM | POA: Diagnosis not present

## 2015-05-31 DIAGNOSIS — M5431 Sciatica, right side: Secondary | ICD-10-CM

## 2015-05-31 DIAGNOSIS — R291 Meningismus: Secondary | ICD-10-CM | POA: Diagnosis not present

## 2015-05-31 DIAGNOSIS — S92902D Unspecified fracture of left foot, subsequent encounter for fracture with routine healing: Secondary | ICD-10-CM | POA: Diagnosis not present

## 2015-05-31 DIAGNOSIS — J449 Chronic obstructive pulmonary disease, unspecified: Secondary | ICD-10-CM | POA: Diagnosis not present

## 2015-05-31 DIAGNOSIS — I951 Orthostatic hypotension: Secondary | ICD-10-CM | POA: Diagnosis not present

## 2015-05-31 DIAGNOSIS — M1990 Unspecified osteoarthritis, unspecified site: Secondary | ICD-10-CM | POA: Diagnosis not present

## 2015-06-04 DIAGNOSIS — Z853 Personal history of malignant neoplasm of breast: Secondary | ICD-10-CM | POA: Diagnosis not present

## 2015-06-04 DIAGNOSIS — I951 Orthostatic hypotension: Secondary | ICD-10-CM | POA: Diagnosis not present

## 2015-06-04 DIAGNOSIS — R296 Repeated falls: Secondary | ICD-10-CM | POA: Diagnosis not present

## 2015-06-04 DIAGNOSIS — M1990 Unspecified osteoarthritis, unspecified site: Secondary | ICD-10-CM | POA: Diagnosis not present

## 2015-06-04 DIAGNOSIS — M545 Low back pain: Secondary | ICD-10-CM | POA: Diagnosis not present

## 2015-06-04 DIAGNOSIS — J449 Chronic obstructive pulmonary disease, unspecified: Secondary | ICD-10-CM | POA: Diagnosis not present

## 2015-06-04 DIAGNOSIS — R291 Meningismus: Secondary | ICD-10-CM | POA: Diagnosis not present

## 2015-06-04 DIAGNOSIS — R2689 Other abnormalities of gait and mobility: Secondary | ICD-10-CM | POA: Diagnosis not present

## 2015-06-04 DIAGNOSIS — S92902D Unspecified fracture of left foot, subsequent encounter for fracture with routine healing: Secondary | ICD-10-CM | POA: Diagnosis not present

## 2015-06-05 ENCOUNTER — Ambulatory Visit
Admission: RE | Admit: 2015-06-05 | Discharge: 2015-06-05 | Disposition: A | Discharge status: Home / Self Care | Payer: Commercial Managed Care - HMO | Source: Ambulatory Visit | Attending: Neurology | Admitting: Neurology

## 2015-06-05 DIAGNOSIS — M5431 Sciatica, right side: Secondary | ICD-10-CM

## 2015-06-05 DIAGNOSIS — M5416 Radiculopathy, lumbar region: Secondary | ICD-10-CM | POA: Diagnosis not present

## 2015-06-05 MED ORDER — IOHEXOL 180 MG/ML  SOLN
1.0000 mL | Freq: Once | INTRAMUSCULAR | Status: DC | PRN
  Administered 2015-06-05: 1 mL via EPIDURAL

## 2015-06-05 MED ORDER — METHYLPREDNISOLONE ACETATE 40 MG/ML INJ SUSP (RADIOLOG
120.0000 mg | Freq: Once | INTRAMUSCULAR | Status: AC
  Administered 2015-06-05: 120 mg via EPIDURAL

## 2015-06-10 DIAGNOSIS — Z853 Personal history of malignant neoplasm of breast: Secondary | ICD-10-CM | POA: Diagnosis not present

## 2015-06-10 DIAGNOSIS — S92902D Unspecified fracture of left foot, subsequent encounter for fracture with routine healing: Secondary | ICD-10-CM | POA: Diagnosis not present

## 2015-06-10 DIAGNOSIS — R296 Repeated falls: Secondary | ICD-10-CM | POA: Diagnosis not present

## 2015-06-10 DIAGNOSIS — R291 Meningismus: Secondary | ICD-10-CM | POA: Diagnosis not present

## 2015-06-10 DIAGNOSIS — J449 Chronic obstructive pulmonary disease, unspecified: Secondary | ICD-10-CM | POA: Diagnosis not present

## 2015-06-10 DIAGNOSIS — I951 Orthostatic hypotension: Secondary | ICD-10-CM | POA: Diagnosis not present

## 2015-06-10 DIAGNOSIS — M545 Low back pain: Secondary | ICD-10-CM | POA: Diagnosis not present

## 2015-06-10 DIAGNOSIS — R2689 Other abnormalities of gait and mobility: Secondary | ICD-10-CM | POA: Diagnosis not present

## 2015-06-10 DIAGNOSIS — M1990 Unspecified osteoarthritis, unspecified site: Secondary | ICD-10-CM | POA: Diagnosis not present

## 2015-06-11 DIAGNOSIS — M545 Low back pain: Secondary | ICD-10-CM | POA: Diagnosis not present

## 2015-06-11 DIAGNOSIS — R291 Meningismus: Secondary | ICD-10-CM | POA: Diagnosis not present

## 2015-06-11 DIAGNOSIS — M1990 Unspecified osteoarthritis, unspecified site: Secondary | ICD-10-CM | POA: Diagnosis not present

## 2015-06-11 DIAGNOSIS — I951 Orthostatic hypotension: Secondary | ICD-10-CM | POA: Diagnosis not present

## 2015-06-11 DIAGNOSIS — Z853 Personal history of malignant neoplasm of breast: Secondary | ICD-10-CM | POA: Diagnosis not present

## 2015-06-11 DIAGNOSIS — R296 Repeated falls: Secondary | ICD-10-CM | POA: Diagnosis not present

## 2015-06-11 DIAGNOSIS — J449 Chronic obstructive pulmonary disease, unspecified: Secondary | ICD-10-CM | POA: Diagnosis not present

## 2015-06-11 DIAGNOSIS — R2689 Other abnormalities of gait and mobility: Secondary | ICD-10-CM | POA: Diagnosis not present

## 2015-06-11 DIAGNOSIS — S92902D Unspecified fracture of left foot, subsequent encounter for fracture with routine healing: Secondary | ICD-10-CM | POA: Diagnosis not present

## 2015-06-12 DIAGNOSIS — N3281 Overactive bladder: Secondary | ICD-10-CM | POA: Diagnosis not present

## 2015-06-12 DIAGNOSIS — I951 Orthostatic hypotension: Secondary | ICD-10-CM | POA: Diagnosis not present

## 2015-06-12 DIAGNOSIS — I10 Essential (primary) hypertension: Secondary | ICD-10-CM | POA: Diagnosis not present

## 2015-06-12 DIAGNOSIS — J449 Chronic obstructive pulmonary disease, unspecified: Secondary | ICD-10-CM | POA: Diagnosis not present

## 2015-06-13 DIAGNOSIS — Z853 Personal history of malignant neoplasm of breast: Secondary | ICD-10-CM | POA: Diagnosis not present

## 2015-06-13 DIAGNOSIS — R296 Repeated falls: Secondary | ICD-10-CM | POA: Diagnosis not present

## 2015-06-13 DIAGNOSIS — J449 Chronic obstructive pulmonary disease, unspecified: Secondary | ICD-10-CM | POA: Diagnosis not present

## 2015-06-13 DIAGNOSIS — R2689 Other abnormalities of gait and mobility: Secondary | ICD-10-CM | POA: Diagnosis not present

## 2015-06-13 DIAGNOSIS — M545 Low back pain: Secondary | ICD-10-CM | POA: Diagnosis not present

## 2015-06-13 DIAGNOSIS — I951 Orthostatic hypotension: Secondary | ICD-10-CM | POA: Diagnosis not present

## 2015-06-13 DIAGNOSIS — S92902D Unspecified fracture of left foot, subsequent encounter for fracture with routine healing: Secondary | ICD-10-CM | POA: Diagnosis not present

## 2015-06-13 DIAGNOSIS — M1990 Unspecified osteoarthritis, unspecified site: Secondary | ICD-10-CM | POA: Diagnosis not present

## 2015-06-13 DIAGNOSIS — R291 Meningismus: Secondary | ICD-10-CM | POA: Diagnosis not present

## 2015-06-14 ENCOUNTER — Ambulatory Visit (INDEPENDENT_AMBULATORY_CARE_PROVIDER_SITE_OTHER): Payer: Commercial Managed Care - HMO | Admitting: Urology

## 2015-06-14 DIAGNOSIS — N3289 Other specified disorders of bladder: Secondary | ICD-10-CM

## 2015-06-14 DIAGNOSIS — N3941 Urge incontinence: Secondary | ICD-10-CM | POA: Diagnosis not present

## 2015-06-16 DIAGNOSIS — S92902D Unspecified fracture of left foot, subsequent encounter for fracture with routine healing: Secondary | ICD-10-CM | POA: Diagnosis not present

## 2015-06-16 DIAGNOSIS — R291 Meningismus: Secondary | ICD-10-CM | POA: Diagnosis not present

## 2015-06-16 DIAGNOSIS — R2689 Other abnormalities of gait and mobility: Secondary | ICD-10-CM | POA: Diagnosis not present

## 2015-06-16 DIAGNOSIS — J449 Chronic obstructive pulmonary disease, unspecified: Secondary | ICD-10-CM | POA: Diagnosis not present

## 2015-06-16 DIAGNOSIS — M545 Low back pain: Secondary | ICD-10-CM | POA: Diagnosis not present

## 2015-06-16 DIAGNOSIS — Z853 Personal history of malignant neoplasm of breast: Secondary | ICD-10-CM | POA: Diagnosis not present

## 2015-06-16 DIAGNOSIS — R296 Repeated falls: Secondary | ICD-10-CM | POA: Diagnosis not present

## 2015-06-16 DIAGNOSIS — I951 Orthostatic hypotension: Secondary | ICD-10-CM | POA: Diagnosis not present

## 2015-06-16 DIAGNOSIS — M1990 Unspecified osteoarthritis, unspecified site: Secondary | ICD-10-CM | POA: Diagnosis not present

## 2015-06-17 DIAGNOSIS — R296 Repeated falls: Secondary | ICD-10-CM | POA: Diagnosis not present

## 2015-06-17 DIAGNOSIS — R2689 Other abnormalities of gait and mobility: Secondary | ICD-10-CM | POA: Diagnosis not present

## 2015-06-17 DIAGNOSIS — I951 Orthostatic hypotension: Secondary | ICD-10-CM | POA: Diagnosis not present

## 2015-06-17 DIAGNOSIS — Z853 Personal history of malignant neoplasm of breast: Secondary | ICD-10-CM | POA: Diagnosis not present

## 2015-06-17 DIAGNOSIS — M545 Low back pain: Secondary | ICD-10-CM | POA: Diagnosis not present

## 2015-06-17 DIAGNOSIS — S92902D Unspecified fracture of left foot, subsequent encounter for fracture with routine healing: Secondary | ICD-10-CM | POA: Diagnosis not present

## 2015-06-17 DIAGNOSIS — M1990 Unspecified osteoarthritis, unspecified site: Secondary | ICD-10-CM | POA: Diagnosis not present

## 2015-06-17 DIAGNOSIS — J449 Chronic obstructive pulmonary disease, unspecified: Secondary | ICD-10-CM | POA: Diagnosis not present

## 2015-06-17 DIAGNOSIS — R291 Meningismus: Secondary | ICD-10-CM | POA: Diagnosis not present

## 2015-06-19 ENCOUNTER — Telehealth: Payer: Self-pay | Admitting: Neurology

## 2015-06-19 MED ORDER — TOPIRAMATE 25 MG PO TABS: 25.0000 mg | ORAL_TABLET | Freq: Every day | ORAL | Status: DC

## 2015-06-19 NOTE — Telephone Encounter (Signed)
Rx has been sent  

## 2015-06-19 NOTE — Telephone Encounter (Signed)
Patient called requesting refill on topiramate (TOPAMAX) 25 MG tablet. [facility] in [place].

## 2015-06-20 DIAGNOSIS — R296 Repeated falls: Secondary | ICD-10-CM | POA: Diagnosis not present

## 2015-06-20 DIAGNOSIS — M1990 Unspecified osteoarthritis, unspecified site: Secondary | ICD-10-CM | POA: Diagnosis not present

## 2015-06-20 DIAGNOSIS — I951 Orthostatic hypotension: Secondary | ICD-10-CM | POA: Diagnosis not present

## 2015-06-20 DIAGNOSIS — R291 Meningismus: Secondary | ICD-10-CM | POA: Diagnosis not present

## 2015-06-20 DIAGNOSIS — S92902D Unspecified fracture of left foot, subsequent encounter for fracture with routine healing: Secondary | ICD-10-CM | POA: Diagnosis not present

## 2015-06-20 DIAGNOSIS — R2689 Other abnormalities of gait and mobility: Secondary | ICD-10-CM | POA: Diagnosis not present

## 2015-06-20 DIAGNOSIS — M545 Low back pain: Secondary | ICD-10-CM | POA: Diagnosis not present

## 2015-06-20 DIAGNOSIS — J449 Chronic obstructive pulmonary disease, unspecified: Secondary | ICD-10-CM | POA: Diagnosis not present

## 2015-06-20 DIAGNOSIS — Z853 Personal history of malignant neoplasm of breast: Secondary | ICD-10-CM | POA: Diagnosis not present

## 2015-06-28 DIAGNOSIS — H3532 Exudative age-related macular degeneration: Secondary | ICD-10-CM | POA: Diagnosis not present

## 2015-07-02 DIAGNOSIS — R296 Repeated falls: Secondary | ICD-10-CM | POA: Diagnosis not present

## 2015-07-02 DIAGNOSIS — M545 Low back pain: Secondary | ICD-10-CM | POA: Diagnosis not present

## 2015-07-02 DIAGNOSIS — I951 Orthostatic hypotension: Secondary | ICD-10-CM | POA: Diagnosis not present

## 2015-07-02 DIAGNOSIS — M1990 Unspecified osteoarthritis, unspecified site: Secondary | ICD-10-CM | POA: Diagnosis not present

## 2015-07-02 DIAGNOSIS — R291 Meningismus: Secondary | ICD-10-CM | POA: Diagnosis not present

## 2015-07-02 DIAGNOSIS — R2689 Other abnormalities of gait and mobility: Secondary | ICD-10-CM | POA: Diagnosis not present

## 2015-07-02 DIAGNOSIS — Z853 Personal history of malignant neoplasm of breast: Secondary | ICD-10-CM | POA: Diagnosis not present

## 2015-07-02 DIAGNOSIS — S92902D Unspecified fracture of left foot, subsequent encounter for fracture with routine healing: Secondary | ICD-10-CM | POA: Diagnosis not present

## 2015-07-02 DIAGNOSIS — J449 Chronic obstructive pulmonary disease, unspecified: Secondary | ICD-10-CM | POA: Diagnosis not present

## 2015-07-09 ENCOUNTER — Other Ambulatory Visit: Payer: Self-pay | Admitting: Urology

## 2015-07-10 ENCOUNTER — Other Ambulatory Visit: Payer: Self-pay | Admitting: Urology

## 2015-07-16 DIAGNOSIS — S92902D Unspecified fracture of left foot, subsequent encounter for fracture with routine healing: Secondary | ICD-10-CM | POA: Diagnosis not present

## 2015-07-16 DIAGNOSIS — R2689 Other abnormalities of gait and mobility: Secondary | ICD-10-CM | POA: Diagnosis not present

## 2015-07-16 DIAGNOSIS — R291 Meningismus: Secondary | ICD-10-CM | POA: Diagnosis not present

## 2015-07-16 DIAGNOSIS — M1990 Unspecified osteoarthritis, unspecified site: Secondary | ICD-10-CM | POA: Diagnosis not present

## 2015-07-16 DIAGNOSIS — J449 Chronic obstructive pulmonary disease, unspecified: Secondary | ICD-10-CM | POA: Diagnosis not present

## 2015-07-16 DIAGNOSIS — M545 Low back pain: Secondary | ICD-10-CM | POA: Diagnosis not present

## 2015-07-16 DIAGNOSIS — I951 Orthostatic hypotension: Secondary | ICD-10-CM | POA: Diagnosis not present

## 2015-07-16 DIAGNOSIS — R296 Repeated falls: Secondary | ICD-10-CM | POA: Diagnosis not present

## 2015-07-16 DIAGNOSIS — Z853 Personal history of malignant neoplasm of breast: Secondary | ICD-10-CM | POA: Diagnosis not present

## 2015-07-17 ENCOUNTER — Encounter (HOSPITAL_BASED_OUTPATIENT_CLINIC_OR_DEPARTMENT_OTHER): Payer: Self-pay | Admitting: *Deleted

## 2015-07-17 NOTE — Progress Notes (Signed)
NPO AFTER MN.  ARRIVE AT 0900.  NEEDS ISTAT.  CURRENT EKG AND CXR IN CHART AND EPIC.  WILL TAKE AM MEDS  W/ SIPS OF WATER DOS .

## 2015-07-22 NOTE — H&P (Signed)
Active Problems  1. Abdominal pain, LLQ (left lower quadrant) (R10.32)  2. Arthritis (M19.90)  3. Asthma (J45.909)  4. Atrophic vaginitis (N95.2)  5. Breast cancer (C50.919)  6. Esophageal reflux (K21.9)  7. Gastric ulcer (K25.9)  8. 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 Nocturia (R35.1)  10. Urge incontinence of urine (N39.41)  History of Present Illness  Kristie Morris returns today in f/u from urodynamics.  She was found to have a 18ml bladder with instability and UUI but no SUI.   She has failed medical therapy.   Past Medical History  1. History of breast cancer (Z85.3)  2. History of chronic obstructive lung disease (Z87.09)  3. History of congestive heart disease (Z86.79)  4. History of hypertension (Z86.79)  5. History of migraine headaches (Z86.69)  6. History of stroke (Z86.73)  7. History of Irritable bowel syndrome with diarrhea (K58.0)  Surgical History  1. History of Breast Surgery Lumpectomy  2. History of Breast Surgery Mastectomy  3. History of Hemorrhoidectomy  4. History of Hysterectomy  5. History of Knee Surgery  6. History of Neck Surgery  Current Meds  1. Advair Diskus 250-50 MCG/DOSE Inhalation Aerosol Powder Breath Activated;  Therapy: (Recorded:11Jul2013) to Recorded  2. Align;  Therapy: (Recorded:11Jul2013) to Recorded  3. Allegra 180 MG TABS;  Therapy: (Recorded:10Jun2016) to Recorded  4. AmLODIPine Besylate 5 MG Oral Tablet;  Therapy: (Recorded:10Jun2016) to Recorded  5. Aspirin 81 MG Oral Tablet Chewable;  Therapy: (Recorded:11Jul2013) to Recorded  6. Dicyclomine HCl - 10 MG Oral Capsule;  Therapy: [id number] to Recorded  7. Gabapentin 100 MG TABS;  Therapy: (Recorded:09Sep2016) to Recorded  8. Ibuprofen CAPS;  Therapy: (Recorded:11Jul2013) to Recorded  9. Naproxen TABS;  Therapy: (Recorded:10Jun2016) to Recorded  10. NexIUM CPDR;   Therapy: (Recorded:10Jun2016) to Recorded  11. PreserVision/Lutein Oral Capsule;  Therapy: (Recorded:10Jun2016) to Recorded  12. Topamax TABS;   Therapy: (Recorded:10Jun2016) to Recorded  13. Vitamin B-12 TABS;   Therapy: (Recorded:10Jun2016) to Recorded  Allergies  1. Vioxx TABS  2. Azithromycin TABS  3. Doxycycline Hyclate CAPS  4. Erythromycin TABS  5. Penicillins  6. Sulfa Drugs  7. Avelox TABS  8. CeleBREX CAPS  9. Cipro TABS  10. MetroNIDAZOLE CAPS  11. Morphine Derivatives  12. Vancocin HCl CAPS  Family History  1. Family history of Death In The Family Father  2. Family history of Death In The Family Mother  3. Family history of Family Health Status Number Of Children  4. Family history of Ovarian Cancer  5. Family history of Prostate Cancer  6. Family history of Renal Failure  Social History  1. Denied: History of Alcohol Use  2. Caffeine Use   2 cups of coffee daily- tea also  3. Former smoker [phone])   quit smoking maybe 30 years ago- one pack per day for about 22 years  4. Marital History - Widowed  5. Occupation: Retired  Review of Systems  Genitourinary: urinary frequency, urinary urgency, nocturia and incontinence.  Gastrointestinal: constipation.  Cardiovascular: no chest pain.  Respiratory: no shortness of breath.    Vitals Vital Signs [Data Includes: Last 1 Day]  Recorded: 09Sep2016 11:14AM  Blood Pressure: 151 / 81 Temperature: 97.8 F Heart Rate: 83  Physical Exam Constitutional: Well nourished and well developed . No acute distress.  Pulmonary: No respiratory distress and normal respiratory rhythm and effort.  Cardiovascular: Heart rate and rhythm are normal . No peripheral edema.    Results/Data Urine [Data Includes: Last 1 Day]  [id number]  COLOR YELLOW   APPEARANCE CLEAR   SPECIFIC GRAVITY 1.015   pH 6.5   GLUCOSE NEGATIVE   BILIRUBIN NEGATIVE   KETONE NEGATIVE   BLOOD NEGATIVE   PROTEIN NEGATIVE   NITRITE NEGATIVE   LEUKOCYTE ESTERASE NEGATIVE    The following images/tracing/specimen were  independently visualized:  Urodynamic fluoro, tracing and report reviewed.  The following clinical lab reports were reviewed:  UA reviewed.    Procedure  Procedure: Cystoscopy   Indication: Lower Urinary Tract Symptoms.  Procedure Note:  Urethral meatus:. No abnormalities.  Anterior urethra: No abnormalities.  Bladder: Visulization was clear. The ureteral orifices were in the normal anatomic position bilaterally. A systematic survey of the bladder demonstrated no bladder tumors or stones. Examination of the bladder demonstrated moderate trabeculation erythematous mucosa (2 small areas on the posterior wall that could be CIS or inflammatory. ). The patient tolerated the procedure well.  Complications: None.    Procedure: Urodynamics  Test indication: Incontinence, Frequency.  The procedure's risks, benefits and infection risk were discussed with the patient.  Pre Uroflow & Catheterization  Procedure: Pre Uroflow Study. Equipment And Procedure:  The patient did not void.  A(n) 14 French red rubber catheter was inserted.  After catheter placement, measurements were obtained the volume collected was 75 ml.  A urodynamic catheter was inserted.  The patient tolerated the procedure well.  Cystometry  Procedure: Cystometrogram.  The bladder was filled with room temperature water at a rate of less than 50 cc per minute.  After bladder filling, measurements obtained include the maximum cystometric capacity of approx. 128ml.  Maximum capacity produced a feeling of bladder fullness.  Bladder sensations were hypersensitive . The first sensation of filling occurred at 13ml. The normal desire to void occurred at 53ml. The strong desire to void occurred at 156ml. The bladder was unstable, the first contraction occurred at 135ml, the maximum unstable bladder contraction pressure was 32 cm H2O and severe urine leakage occurred.   Injection of contrast was performed for the cystometrogram.  Filling was  continued for a voluntary void.  Leak Point Pressure  Procedure: Valsalva Leak Point Pressure.  The patient was placed in the sitting position.  A fluoroscopic method was used to determine leak point pressure.  The patient did not leak and the maximum abdominal pressure generated was 83 cm H2O.  Pressure - Flow Study  Procedure: Pressure-Flow Study.  Findings: voluntary contraction generated, the volume voided was 91 ml, the maximum flow rate was 8 ml/s, the detrusor pressure at maximum flow was 29 cmH20, the maximum detrusor pressure was 38 cm H2O and the postvoid residual was approx. 60 ml.  She said she felt in control of initiating this void.  Electromyogram  Procedure: Electromyogram. Equipment And Procedure:  Electromyogram activity was measured by surface electrodes.  There was mild increased EMG activity during voiding.  Fluoroscopy and VCUG  Procedure: Fluoroscopy and VCUG.  During a cough/valsalva, the bladder neck descended approximately 1 cm or less.  During VCUG/Cystogram, the precontrast film showed no bony abnormality.  During bladder filling, the contour was trabeculated. Findings included no reflux. There were no complications.  Nurse Impression: Kristie Morris held a max capacity of approx. 160 mls. No SUI was noted. There was positive instability with involuntary voiding. Filling was continued, and she was able to generate a voluntary void. Pressures were a bit high. Her flow was interrupted. Mild increased EMG activity was noted during her void. She left a pvr of approx.[place]  mls. Trabeculation was noted. No reflux was seen.  Procedure was supervised by Dr. Jeffie Pollock    Assessment  1. Urge incontinence of urine (N39.41)  2. 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 Detrusor instability (N32.81)   She has a bladder lesion that is subtle but needs evaluation and she has a small capacity unstable bladder.   Plan Urge incontinence of urine   1. Cysto; Status:Complete;    [id number]  2. UA With REFLEX; [Do Not Release]; Status:Hold For - [employer];  Requested [id number];    I am going to set her up for cystoscopy with hydrodistention of the bladder and biopsy. I reviewed the risks of bleeding, infection, bladder wall injury, retention, thrombotic events and anesthetic risks. I discussed another trial of meds but she is concerned about constipation because a prior history of impaction. I discussed Botox, PTNS and interstim.  She was not interested in botox but I will readdress neuromodulation if she doesn't improve with the HOD.   Discussion/Summary   CC: Dr. Velvet Bathe.

## 2015-07-23 ENCOUNTER — Encounter (HOSPITAL_BASED_OUTPATIENT_CLINIC_OR_DEPARTMENT_OTHER)
Admission: RE | Disposition: A | Discharge status: Home / Self Care | Payer: Self-pay | Source: Ambulatory Visit | Attending: Urology

## 2015-07-23 ENCOUNTER — Ambulatory Visit (HOSPITAL_BASED_OUTPATIENT_CLINIC_OR_DEPARTMENT_OTHER): Payer: Commercial Managed Care - HMO | Admitting: Anesthesiology

## 2015-07-23 ENCOUNTER — Ambulatory Visit (HOSPITAL_BASED_OUTPATIENT_CLINIC_OR_DEPARTMENT_OTHER)
Admission: RE | Admit: 2015-07-23 | Discharge: 2015-07-23 | Disposition: A | Discharge status: Home / Self Care | Payer: Commercial Managed Care - HMO | Source: Ambulatory Visit | Attending: Urology | Admitting: Urology

## 2015-07-23 ENCOUNTER — Encounter (HOSPITAL_BASED_OUTPATIENT_CLINIC_OR_DEPARTMENT_OTHER): Payer: Self-pay

## 2015-07-23 DIAGNOSIS — Z79899 Other long term (current) drug therapy: Secondary | ICD-10-CM | POA: Diagnosis not present

## 2015-07-23 DIAGNOSIS — Z853 Personal history of malignant neoplasm of breast: Secondary | ICD-10-CM | POA: Diagnosis not present

## 2015-07-23 DIAGNOSIS — Z7982 Long term (current) use of aspirin: Secondary | ICD-10-CM | POA: Diagnosis not present

## 2015-07-23 DIAGNOSIS — N3941 Urge incontinence: Secondary | ICD-10-CM | POA: Diagnosis not present

## 2015-07-23 DIAGNOSIS — I110 Hypertensive heart disease with heart failure: Secondary | ICD-10-CM | POA: Insufficient documentation

## 2015-07-23 DIAGNOSIS — Z791 Long term (current) use of non-steroidal anti-inflammatories (NSAID): Secondary | ICD-10-CM | POA: Diagnosis not present

## 2015-07-23 DIAGNOSIS — N3289 Other specified disorders of bladder: Secondary | ICD-10-CM | POA: Diagnosis not present

## 2015-07-23 DIAGNOSIS — K219 Gastro-esophageal reflux disease without esophagitis: Secondary | ICD-10-CM | POA: Insufficient documentation

## 2015-07-23 DIAGNOSIS — R1032 Left lower quadrant pain: Secondary | ICD-10-CM | POA: Diagnosis not present

## 2015-07-23 DIAGNOSIS — N329 Bladder disorder, unspecified: Secondary | ICD-10-CM | POA: Diagnosis present

## 2015-07-23 DIAGNOSIS — M797 Fibromyalgia: Secondary | ICD-10-CM | POA: Insufficient documentation

## 2015-07-23 DIAGNOSIS — M1990 Unspecified osteoarthritis, unspecified site: Secondary | ICD-10-CM | POA: Diagnosis not present

## 2015-07-23 DIAGNOSIS — Z8673 Personal history of transient ischemic attack (TIA), and cerebral infarction without residual deficits: Secondary | ICD-10-CM | POA: Insufficient documentation

## 2015-07-23 DIAGNOSIS — J449 Chronic obstructive pulmonary disease, unspecified: Secondary | ICD-10-CM | POA: Diagnosis not present

## 2015-07-23 DIAGNOSIS — Z87891 Personal history of nicotine dependence: Secondary | ICD-10-CM | POA: Insufficient documentation

## 2015-07-23 DIAGNOSIS — K580 Irritable bowel syndrome with diarrhea: Secondary | ICD-10-CM | POA: Diagnosis not present

## 2015-07-23 DIAGNOSIS — Z9010 Acquired absence of unspecified breast and nipple: Secondary | ICD-10-CM | POA: Diagnosis not present

## 2015-07-23 DIAGNOSIS — J45909 Unspecified asthma, uncomplicated: Secondary | ICD-10-CM | POA: Insufficient documentation

## 2015-07-23 DIAGNOSIS — N3030 Trigonitis without hematuria: Secondary | ICD-10-CM | POA: Diagnosis not present

## 2015-07-23 DIAGNOSIS — I509 Heart failure, unspecified: Secondary | ICD-10-CM | POA: Diagnosis not present

## 2015-07-23 HISTORY — DX: Headache, unspecified: R51.9

## 2015-07-23 HISTORY — DX: Esophageal obstruction: K22.2

## 2015-07-23 HISTORY — DX: Headache: R51

## 2015-07-23 HISTORY — DX: Unspecified sequelae of cerebral infarction: I69.30

## 2015-07-23 HISTORY — DX: Other specified postprocedural states: Z98.890

## 2015-07-23 HISTORY — DX: Bladder disorder, unspecified: N32.9

## 2015-07-23 HISTORY — DX: Venous insufficiency (chronic) (peripheral): I87.2

## 2015-07-23 HISTORY — DX: Personal history of malignant neoplasm of breast: Z85.3

## 2015-07-23 HISTORY — DX: Presence of dental prosthetic device (complete) (partial): Z97.2

## 2015-07-23 HISTORY — DX: Personal history of other diseases of the digestive system: Z87.19

## 2015-07-23 HISTORY — DX: Presence of dental prosthetic device (complete) (partial): K08.109

## 2015-07-23 HISTORY — PX: CYSTOSCOPY WITH HYDRODISTENSION AND BIOPSY: SHX5127

## 2015-07-23 HISTORY — DX: History of falling: Z91.81

## 2015-07-23 HISTORY — DX: Chronic diastolic (congestive) heart failure: I50.32

## 2015-07-23 HISTORY — DX: Mixed incontinence: N39.46

## 2015-07-23 HISTORY — DX: Asymptomatic varicose veins of unspecified lower extremity: I83.90

## 2015-07-23 HISTORY — DX: Unspecified macular degeneration: H35.30

## 2015-07-23 HISTORY — DX: Localized edema: R60.0

## 2015-07-23 HISTORY — DX: Diverticulosis of small intestine without perforation or abscess without bleeding: K57.10

## 2015-07-23 HISTORY — DX: Unspecified abnormalities of gait and mobility: R26.9

## 2015-07-23 HISTORY — DX: Low vision, one eye, unspecified eye: H54.50

## 2015-07-23 HISTORY — DX: Reserved for inherently not codable concepts without codable children: IMO0001

## 2015-07-23 LAB — POCT I-STAT, CHEM 8
BUN: 22 mg/dL — AB (ref 6–20)
CALCIUM ION: 1.27 mmol/L (ref 1.13–1.30)
Chloride: 105 mmol/L (ref 101–111)
Creatinine, Ser: 1.2 mg/dL — ABNORMAL HIGH (ref 0.44–1.00)
Glucose, Bld: 97 mg/dL (ref 65–99)
HEMATOCRIT: 38 % (ref 36.0–46.0)
HEMOGLOBIN: 12.9 g/dL (ref 12.0–15.0)
Potassium: 3.8 mmol/L (ref 3.5–5.1)
SODIUM: 143 mmol/L (ref 135–145)
TCO2: 26 mmol/L (ref 0–100)

## 2015-07-23 SURGERY — CYSTOSCOPY, WITH BLADDER HYDRODISTENSION AND BIOPSY
Anesthesia: General | Site: Bladder

## 2015-07-23 MED ORDER — SODIUM CHLORIDE 0.9 % IJ SOLN
3.0000 mL | Freq: Two times a day (BID) | INTRAMUSCULAR | Status: DC
  Filled 2015-07-23: qty 3

## 2015-07-23 MED ORDER — SODIUM CHLORIDE 0.9 % IJ SOLN
3.0000 mL | INTRAMUSCULAR | Status: DC | PRN
  Filled 2015-07-23: qty 3

## 2015-07-23 MED ORDER — GENTAMICIN SULFATE 40 MG/ML IJ SOLN
5.0000 mg/kg | INTRAVENOUS | Status: AC
  Administered 2015-07-23: 330 mg via INTRAVENOUS
  Filled 2015-07-23 (×2): qty 8.25

## 2015-07-23 MED ORDER — ACETAMINOPHEN 650 MG RE SUPP
650.0000 mg | RECTAL | Status: DC | PRN
  Filled 2015-07-23: qty 1

## 2015-07-23 MED ORDER — PROPOFOL 10 MG/ML IV BOLUS
INTRAVENOUS | Status: DC | PRN
  Administered 2015-07-23: 140 mg via INTRAVENOUS

## 2015-07-23 MED ORDER — NITROFURANTOIN MONOHYD MACRO 100 MG PO CAPS: 100.0000 mg | ORAL_CAPSULE | Freq: Two times a day (BID) | ORAL | Status: DC

## 2015-07-23 MED ORDER — BUPIVACAINE HCL (PF) 0.5 % IJ SOLN
INTRAMUSCULAR | Status: DC | PRN
  Administered 2015-07-23: 30 mL via INTRAVESICAL

## 2015-07-23 MED ORDER — FENTANYL CITRATE (PF) 100 MCG/2ML IJ SOLN
25.0000 ug | INTRAMUSCULAR | Status: DC | PRN
  Filled 2015-07-23: qty 1

## 2015-07-23 MED ORDER — SODIUM CHLORIDE 0.9 % IV SOLN
250.0000 mL | INTRAVENOUS | Status: DC | PRN
  Filled 2015-07-23: qty 250

## 2015-07-23 MED ORDER — LACTATED RINGERS IV SOLN
INTRAVENOUS | Status: DC
  Administered 2015-07-23: 10:00:00 via INTRAVENOUS
  Filled 2015-07-23: qty 1000

## 2015-07-23 MED ORDER — ONDANSETRON HCL 4 MG/2ML IJ SOLN
INTRAMUSCULAR | Status: DC | PRN
  Administered 2015-07-23: 4 mg via INTRAVENOUS

## 2015-07-23 MED ORDER — GENTAMICIN SULFATE 40 MG/ML IJ SOLN
5.0000 mg/kg | Freq: Once | INTRAVENOUS | Status: DC
  Filled 2015-07-23: qty 11

## 2015-07-23 MED ORDER — ACETAMINOPHEN 325 MG PO TABS
650.0000 mg | ORAL_TABLET | ORAL | Status: DC | PRN
  Filled 2015-07-23: qty 2

## 2015-07-23 MED ORDER — PHENAZOPYRIDINE HCL 200 MG PO TABS: 200.0000 mg | ORAL_TABLET | Freq: Three times a day (TID) | ORAL | Status: DC | PRN

## 2015-07-23 MED ORDER — FENTANYL CITRATE (PF) 100 MCG/2ML IJ SOLN
INTRAMUSCULAR | Status: DC | PRN
  Administered 2015-07-23: 50 ug via INTRAVENOUS

## 2015-07-23 MED ORDER — FENTANYL CITRATE (PF) 100 MCG/2ML IJ SOLN
INTRAMUSCULAR | Status: AC
Start: 2015-07-23 — End: 2015-07-23
  Filled 2015-07-23: qty 2

## 2015-07-23 MED ORDER — LIDOCAINE HCL (CARDIAC) 20 MG/ML IV SOLN
INTRAVENOUS | Status: DC | PRN
  Administered 2015-07-23: 60 mg via INTRAVENOUS

## 2015-07-23 MED ORDER — STERILE WATER FOR IRRIGATION IR SOLN
Status: DC | PRN
  Administered 2015-07-23: 3000 mL via INTRAVESICAL

## 2015-07-23 MED ORDER — TRAMADOL-ACETAMINOPHEN 37.5-325 MG PO TABS: 1.0000 | ORAL_TABLET | Freq: Four times a day (QID) | ORAL | Status: DC | PRN

## 2015-07-23 SURGICAL SUPPLY — 19 items
BAG DRAIN URO-CYSTO SKYTR STRL (DRAIN) ×3 IMPLANT
BAG DRN UROCATH (DRAIN) ×1
CANISTER SUCT LVC 12 LTR MEDI- (MISCELLANEOUS) IMPLANT
CATH FOLEY 2WAY SLVR  5CC 16FR (CATHETERS) ×2
CATH FOLEY 2WAY SLVR 5CC 16FR (CATHETERS) ×1 IMPLANT
CLOTH BEACON ORANGE TIMEOUT ST (SAFETY) ×3 IMPLANT
ELECT REM PT RETURN 9FT ADLT (ELECTROSURGICAL) ×3
ELECTRODE REM PT RTRN 9FT ADLT (ELECTROSURGICAL) ×1 IMPLANT
GLOVE SURG SS PI 8.0 STRL IVOR (GLOVE) ×3 IMPLANT
GOWN STRL REUS W/ TWL XL LVL3 (GOWN DISPOSABLE) ×1 IMPLANT
GOWN STRL REUS W/TWL XL LVL3 (GOWN DISPOSABLE) ×3
KIT ROOM TURNOVER WOR (KITS) ×6 IMPLANT
MANIFOLD NEPTUNE II (INSTRUMENTS) IMPLANT
NDL SAFETY ECLIPSE 18X1.5 (NEEDLE) ×1 IMPLANT
NEEDLE HYPO 18GX1.5 SHARP (NEEDLE) ×3
NS IRRIG 500ML POUR BTL (IV SOLUTION) IMPLANT
PACK CYSTO (CUSTOM PROCEDURE TRAY) ×3 IMPLANT
SYR 30ML LL (SYRINGE) ×3 IMPLANT
WATER STERILE IRR 3000ML UROMA (IV SOLUTION) ×3 IMPLANT

## 2015-07-23 NOTE — Interval H&P Note (Signed)
History and Physical Interval Note:  07/23/2015 9:42 AM  Kristie Morris  has presented today for surgery, with the diagnosis of bladder lesion  The various methods of treatment have been discussed with the patient and family. After consideration of risks, benefits and other options for treatment, the patient has consented to  Procedure(s): CYSTOSCOPY/BLADDER BIOPSY/HYDRODISTENSION (N/A) as a surgical intervention .  The patient's history has been reviewed, patient examined, no change in status, stable for surgery.  I have reviewed the patient's chart and labs.  Questions were answered to the patient's satisfaction.     [name]

## 2015-07-23 NOTE — Anesthesia Procedure Notes (Signed)
Procedure Name: LMA Insertion Date/Time: 07/23/2015 10:55 AM Performed by: Denna Haggard D Pre-anesthesia Checklist: Patient identified, Emergency Drugs available, Suction available and Patient being monitored Patient Re-evaluated:Patient Re-evaluated prior to inductionOxygen Delivery Method: Circle System Utilized Preoxygenation: Pre-oxygenation with 100% oxygen Intubation Type: IV induction Ventilation: Mask ventilation without difficulty LMA: LMA inserted LMA Size: 4.0 Number of attempts: 1 Airway Equipment and Method: Bite block Placement Confirmation: positive ETCO2 Tube secured with: Tape Dental Injury: Teeth and Oropharynx as per pre-operative assessment

## 2015-07-23 NOTE — Anesthesia Postprocedure Evaluation (Signed)
  Anesthesia Post-op Note  Patient: Kristie Morris  Procedure(s) Performed: Procedure(s): CYSTOSCOPY;HYDRODISTENSION (N/A)  Patient Location: PACU  Anesthesia Type:General  Level of Consciousness: awake  Airway and Oxygen Therapy: Patient Spontanous Breathing  Post-op Pain: mild  Post-op Assessment: Post-op Vital signs reviewed              Post-op Vital Signs: Reviewed  Last Vitals:  Filed Vitals:   07/23/15 1145  BP: 147/74  Pulse: 86  Temp:   Resp: 18    Complications: No apparent anesthesia complications

## 2015-07-23 NOTE — Transfer of Care (Signed)
Immediate Anesthesia Transfer of Care Note  Patient: Kristie Morris  Procedure(s) Performed: Procedure(s) (LRB): CYSTOSCOPY;HYDRODISTENSION (N/A)  Patient Location: PACU  Anesthesia Type: General  Level of Consciousness: awake, oriented, sedated and patient cooperative  Airway & Oxygen Therapy: Patient Spontanous Breathing and Patient connected to face mask oxygen  Post-op Assessment: Report given to PACU RN and Post -op Vital signs reviewed and stable  Post vital signs: Reviewed and stable  Complications: No apparent anesthesia complications

## 2015-07-23 NOTE — Anesthesia Preprocedure Evaluation (Signed)
Anesthesia Evaluation  Patient identified by MRN, date of birth, ID band Patient awake    Reviewed: Allergy & Precautions, NPO status , Patient's Chart, lab work & pertinent test results  Airway Mallampati: II  TM Distance: >3 FB Neck ROM: Full    Dental   Pulmonary shortness of breath, asthma , COPD, former smoker,    breath sounds clear to auscultation       Cardiovascular hypertension, + CAD, + Peripheral Vascular Disease and +CHF  + Valvular Problems/Murmurs  Rhythm:Regular Rate:Normal     Neuro/Psych    GI/Hepatic Neg liver ROS, hiatal hernia, GERD  ,  Endo/Other    Renal/GU negative Renal ROS     Musculoskeletal  (+) Arthritis , Fibromyalgia -  Abdominal   Peds  Hematology   Anesthesia Other Findings   Reproductive/Obstetrics                             Anesthesia Physical Anesthesia Plan  ASA: II  Anesthesia Plan: General   Post-op Pain Management:    Induction: Intravenous  Airway Management Planned: LMA  Additional Equipment:   Intra-op Plan:   Post-operative Plan: Extubation in OR  Informed Consent: I have reviewed the patients History and Physical, chart, labs and discussed the procedure including the risks, benefits and alternatives for the proposed anesthesia with the patient or authorized representative who has indicated his/her understanding and acceptance.   Dental advisory given  Plan Discussed with: CRNA and Anesthesiologist  Anesthesia Plan Comments:         Anesthesia Quick Evaluation

## 2015-07-23 NOTE — Discharge Instructions (Addendum)

## 2015-07-23 NOTE — Brief Op Note (Signed)
07/23/2015  11:10 AM  PATIENT:  Kristie Morris  81 y.o. female  PRE-OPERATIVE DIAGNOSIS:  bladder lesion with small capacity unstable bladder.  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  PROCEDURE:  Procedure(s): CYSTOSCOPY/HYDRODISTENSION/INSTILLATION OF P&M (N/A)  SURGEON:  Surgeon(s) and Role:    * Irine Seal, MD - Primary  PHYSICIAN ASSISTANT:   ASSISTANTS: none   ANESTHESIA:   general  EBL:  Total I/O In: 200 [I.V.:200] Out: -   BLOOD ADMINISTERED:none  DRAINS: none   LOCAL MEDICATIONS USED:  MARCAINE    and Amount: 30 ml  SPECIMEN:  No Specimen  DISPOSITION OF SPECIMEN:  N/A  COUNTS:  YES  TOURNIQUET:  * No tourniquets in log *  DICTATION: .Other Dictation: Dictation Number [id number]  PLAN OF CARE: Discharge to home after PACU  PATIENT DISPOSITION:  PACU - hemodynamically stable.   Delay start of Pharmacological VTE agent (>24hrs) due to surgical blood loss or risk of bleeding: not applicable

## 2015-07-24 ENCOUNTER — Encounter (HOSPITAL_BASED_OUTPATIENT_CLINIC_OR_DEPARTMENT_OTHER): Payer: Self-pay | Admitting: Urology

## 2015-07-24 NOTE — Op Note (Signed)
NAMESUZZANE, WARRIOR NO.:  [id number]  MEDICAL RECORD NO.:  [id number]  LOCATION:                               FACILITY:  [facility]  PHYSICIAN:  Marshall Cork. Jeffie Pollock, M.D.    DATE OF BIRTH:  [birth year]  DATE OF PROCEDURE:  07/23/2015 DATE OF DISCHARGE:  07/23/2015                              OPERATIVE REPORT   PROCEDURE:  Cystoscopy with hydrodistention of bladder, installation of Pyridium and Marcaine.  PREOPERATIVE DIAGNOSIS:  Small capacity unstable bladder with bladder lesion, rule out interstitial cystitis.  POSTOPERATIVE DIAGNOSIS:  Small capacity unstable bladder with findings consistent with follicular cystitis.  SURGEON:  Marshall Cork. Jeffie Pollock, M.D.  ANESTHESIA:  General.  SPECIMEN:  None.  DRAINS:  None.  BLOOD LOSS:  None.  COMPLICATIONS:  None.  INDICATIONS:  Kristie Morris is an 82 year old white female with a very small capacity bladder that was 160 mL on urodynamics with some instability. Office cystoscopy revealed an erythematous lesion that was felt to require biopsy and along with her small capacity, it was felt that hydrodistention was indicated to rule out interstitial cystitis.  FINDINGS AND PROCEDURE:  She was given gentamicin due to her extensive antibiotic allergies.  She was taken to the operating room, where general anesthetic was induced.  She was placed in the lithotomy position.  Her perineum and genitalia were prepped with Betadine solution.  She was draped in usual sterile fashion.  Cystoscopy was performed using 23-French scope and 30-degree lens. Examination revealed a normal urethra.  The bladder wall had mild trabeculation.  There were some changes consistent with follicular cystitis on the trigone, but the previously noted erythematous lesion was not apparent on a thorough cystoscopic inspection.  Ureteral orifices were in their normal anatomic position, effluxing clear urine.  The bladder was then filled under 80 cm water  pressure to capacity and then drained.  Her capacity under anesthesia was 650 mL, but the terminal efflux was clear.  Reinspection the bladder after cystoscopy revealed no glomerulations, bleedings, cracks, or Hunner's ulcers.  Once re-cystoscopy was complete, the bladder was drained and a red rubber catheter was used to instill 30 mL of 0.5% Marcaine with 400 mg of crushed Pyridium.  The catheter was removed.  She was taken down from lithotomy position.  Her anesthetic was reversed.  She was moved to recovery room in stable condition.  There were no complications.     Marshall Cork. Jeffie Pollock, M.D.     JJW/MEDQ  D:  07/23/2015  T:  07/23/2015  Job:  [id number]

## 2015-07-30 DIAGNOSIS — N3281 Overactive bladder: Secondary | ICD-10-CM | POA: Diagnosis not present

## 2015-07-30 DIAGNOSIS — Z23 Encounter for immunization: Secondary | ICD-10-CM | POA: Diagnosis not present

## 2015-07-30 DIAGNOSIS — I10 Essential (primary) hypertension: Secondary | ICD-10-CM | POA: Diagnosis not present

## 2015-07-30 DIAGNOSIS — K589 Irritable bowel syndrome without diarrhea: Secondary | ICD-10-CM | POA: Diagnosis not present

## 2015-07-30 DIAGNOSIS — I509 Heart failure, unspecified: Secondary | ICD-10-CM | POA: Diagnosis not present

## 2015-08-14 DIAGNOSIS — M1711 Unilateral primary osteoarthritis, right knee: Secondary | ICD-10-CM | POA: Diagnosis not present

## 2015-08-14 DIAGNOSIS — M1712 Unilateral primary osteoarthritis, left knee: Secondary | ICD-10-CM | POA: Diagnosis not present

## 2015-08-16 ENCOUNTER — Ambulatory Visit (INDEPENDENT_AMBULATORY_CARE_PROVIDER_SITE_OTHER): Payer: Commercial Managed Care - HMO | Admitting: Urology

## 2015-08-16 DIAGNOSIS — N3281 Overactive bladder: Secondary | ICD-10-CM

## 2015-08-16 DIAGNOSIS — N3941 Urge incontinence: Secondary | ICD-10-CM

## 2015-08-16 DIAGNOSIS — N952 Postmenopausal atrophic vaginitis: Secondary | ICD-10-CM | POA: Diagnosis not present

## 2015-08-26 ENCOUNTER — Other Ambulatory Visit: Payer: Self-pay | Admitting: Neurology

## 2015-09-10 DIAGNOSIS — I951 Orthostatic hypotension: Secondary | ICD-10-CM | POA: Diagnosis not present

## 2015-09-10 DIAGNOSIS — I509 Heart failure, unspecified: Secondary | ICD-10-CM | POA: Diagnosis not present

## 2015-09-10 DIAGNOSIS — I10 Essential (primary) hypertension: Secondary | ICD-10-CM | POA: Diagnosis not present

## 2015-09-10 DIAGNOSIS — J449 Chronic obstructive pulmonary disease, unspecified: Secondary | ICD-10-CM | POA: Diagnosis not present

## 2015-09-16 ENCOUNTER — Encounter: Payer: Self-pay | Admitting: Cardiovascular Disease

## 2015-09-16 ENCOUNTER — Ambulatory Visit (INDEPENDENT_AMBULATORY_CARE_PROVIDER_SITE_OTHER): Payer: Commercial Managed Care - HMO | Admitting: Cardiovascular Disease

## 2015-09-16 VITALS — BP 128/74 | HR 91 | Ht 63.0 in | Wt 200.2 lb

## 2015-09-16 DIAGNOSIS — I8390 Asymptomatic varicose veins of unspecified lower extremity: Secondary | ICD-10-CM

## 2015-09-16 DIAGNOSIS — R072 Precordial pain: Secondary | ICD-10-CM | POA: Diagnosis not present

## 2015-09-16 DIAGNOSIS — I10 Essential (primary) hypertension: Secondary | ICD-10-CM | POA: Diagnosis not present

## 2015-09-16 DIAGNOSIS — R600 Localized edema: Secondary | ICD-10-CM

## 2015-09-16 DIAGNOSIS — I5032 Chronic diastolic (congestive) heart failure: Secondary | ICD-10-CM | POA: Diagnosis not present

## 2015-09-16 DIAGNOSIS — I868 Varicose veins of other specified sites: Secondary | ICD-10-CM | POA: Diagnosis not present

## 2015-09-16 NOTE — Progress Notes (Signed)
Patient ID: Kristie Morris, female   DOB: Jul 10, 1925, 81 y.o.   MRN: [id number]      SUBJECTIVE: The patient presents for follow-up of chronic diastolic heart failure and malignant hypertension. She has had a CVA in the past as well.  She is no longer on amlodipine due to leg swelling, stopped by her PCP (Dr. Luan Pulling). Has been experiencing left inframammary chest pain radiating into the left axilla three times daily and lasting for several minutes at a time. She has been prescribed sublingual nitroglycerin by her PCP as well. She is bothered by left leg swelling. She no longer takes Lasix but takes hydrochlorothiazide for both hypertension and diuresis.   Review of Systems: As per "subjective", otherwise negative.  Allergies  Allergen Reactions  . Zithromax [Azithromycin] Diarrhea    Pt in hosp for 2 weeks   . Celebrex [Celecoxib] Hives  . Ciprofloxacin Hives  . Metronidazole Hives  . Morphine And Related Rash  . Moxifloxacin Hives  . Penicillins Hives  . Polymyxin B Hives  . Sulfa Antibiotics Hives  . Vancomycin Hives  . Vioxx [Rofecoxib] Hives    Current Outpatient Prescriptions  Medication Sig Dispense Refill  . ADVAIR DISKUS 100-50 MCG/DOSE AEPB     . albuterol (PROVENTIL HFA;VENTOLIN HFA) 108 (90 BASE) MCG/ACT inhaler Inhale 2 puffs into the lungs every 6 (six) hours as needed for wheezing or shortness of breath.    . ALPRAZolam (XANAX) 0.25 MG tablet Take 0.25 mg by mouth 2 (two) times daily as needed for anxiety.    Marland Kitchen aspirin EC 81 MG tablet Take 81 mg by mouth daily.    . Cyanocobalamin 2500 MCG CHEW Chew 1 tablet by mouth daily.    Marland Kitchen dicyclomine (BENTYL) 20 MG tablet Take 20 mg by mouth every morning.     Marland Kitchen esomeprazole (NEXIUM) 20 MG capsule Take 20 mg by mouth every morning.     . Fluticasone-Salmeterol (ADVAIR) 250-50 MCG/DOSE AEPB Inhale 2 puffs into the lungs every evening.     . gabapentin (NEURONTIN) 100 MG capsule TAKE (1) CAPSULE BY MOUTH THREE TIMES DAILY 90  capsule 1  . hydrochlorothiazide (HYDRODIURIL) 25 MG tablet     . ibuprofen (ADVIL,MOTRIN) 200 MG tablet Take 200 mg by mouth every 6 (six) hours as needed for mild pain or moderate pain.     . Multiple Vitamins-Minerals (PRESERVISION AREDS PO) Take 1 tablet by mouth daily.    . nitroGLYCERIN (NITROSTAT) 0.4 MG SL tablet     . Polyethylene Glycol 3350 (MIRALAX PO) Take by mouth daily.    . Probiotic Product (ALIGN PO) Take 1 capsule by mouth daily.    Marland Kitchen topiramate (TOPAMAX) 25 MG tablet Take 1 tablet (25 mg total) by mouth at bedtime. (Patient taking differently: Take 50 mg by mouth at bedtime. ) 30 tablet 3  . topiramate (TOPAMAX) 50 MG tablet     . traMADol-acetaminophen (ULTRACET) 37.5-325 MG tablet Take 1 tablet by mouth every 6 (six) hours as needed. 15 tablet 0   No current facility-administered medications for this visit.    Past Medical History  Diagnosis Date  . Asthma   . COPD (chronic obstructive pulmonary disease) ([place])   . Heart murmur   . GERD (gastroesophageal reflux disease)   . Chronic lower back pain   . Anxiety   . Malignant hypertension   . History of CVA with residual deficit     06/ 2015  residual left hemiparesis  . Chronic headache   .  Fibromyalgia   . OAB (overactive bladder)   . Sciatica of right side 05/29/2015  . Arthritis     knee  . History of small bowel obstruction     04-25-2015  resolved without surgical intervention  . Gait disorder     uses walker  . History of colitis     2001  . History of Clostridium difficile   . Schatzki's ring   . History of esophageal dilatation   . History of hiatal hernia   . Duodenal diverticulum   . Urge and stress incontinence   . Lesion of bladder   . History of falling     last fall 06-25-2015  . History of breast cancer   . Diastolic CHF, chronic ([place])   . CAD (coronary artery disease) cardiologist-  dr Bronson Ing  ( cardio in [place])    2003  Cath with nonobstructive CAD  . Venous  insufficiency, peripheral   . Varicose veins   . Edema of left lower extremity   . Macular degeneration of both eyes     right eye now low vision/  left eye getting injections currently  . Low vision, one eye     right eye  . Full dentures     Past Surgical History  Procedure Laterality Date  . Mastectomy, partial Right 1999  . Knee arthroscopy Right 2005  . Ovarian cyst removal  1946  . Hemorrhoid surgery  1958  . Anterior cervical decomp/discectomy fusion  1971    "used bone off of my right hip"  . Abdominal adhesion surgery  1993    from colon  . Breast lumpectomy Left 2006  . Patella fracture surgery Left 2008  . Cardiac catheterization  11-29-2001  dr Tressia Miners turner    Non-obstructive CAD/  20% pLAD,  30% pD1, 50% mRCA/  normal LVF  . Transthoracic echocardiogram  01-09-2015    mild LVH,  ef [id number], grade I diastolic dysfunction/  mild MR/  trivial pericardial effusion was identified  . Cataract extraction w/ intraocular lens  implant, bilateral  right 2008//  left 2015  . Dilation and curettage of uterus  1953     miscarriage  . Abdominal hysterectomy  1954    w/ bilateral salpingoophorectomy  . Cystoscopy with hydrodistension and biopsy N/A 07/23/2015    Procedure: CYSTOSCOPY;HYDRODISTENSION;  Surgeon: Irine Seal, MD;  [facility];  Service: Urology;  Laterality: N/A;    Social History   Social History  . Marital Status: Widowed    Spouse Name: N/A  . Number of Children: 2  . Years of Education: 16   Occupational History  . RETIRED   . ACCOUNTANT    Social History Main Topics  . Smoking status: Former Smoker -- 0.50 packs/day for 15 years    Types: Cigarettes    Quit date: 07/17/1987  . Smokeless tobacco: Never Used  . Alcohol Use: No  . Drug Use: No  . Sexual Activity: Not on file   Other Topics Concern  . Not on file   Social History Narrative   Patient drinks 2 cups of caffeine daily.   Patient is right handed.         Filed Vitals:   09/16/15 1507  BP: 128/74  Pulse: 91  Height: 5\' 3"  (1.6 m)  Weight: 200 lb 3.2 oz (90.81 kg)  SpO2: 97%    PHYSICAL EXAM General: NAD HEENT: Normal. Neck: No JVD, no thyromegaly. Lungs: Clear to auscultation bilaterally with  normal respiratory effort. CV: Nondisplaced PMI. Regular rate and rhythm, normal S1/S2, no [id number], soft 1/6 systolic murmur along left sternal border. Mild left periankle edema with bilateral venous varicosities, left > right.Adiposity of both calves, left > right.  Abdomen: Soft, obese, no distention.  Neurologic: Alert and oriented.  Psych: Normal affect. Skin: Normal. Musculoskeletal: No gross deformities. Extremities: No clubbing or cyanosis.   ECG: Most recent ECG reviewed.      ASSESSMENT AND PLAN: 1. Malignant HTN: Controlled on present therapy. No changes to current regimen, which includes HCTZ 25 mg. No longer on amlodipine due to exacerbation of swelling.  2. Chronic diastolic heart failure: Euvolemic and compensated with minimal left periankle swelling, primarily related to venous insufficiency. Continue HCTZ. No longer on Lasix. I previously prescribed knee-high compression stockings 20-30 mmHg.  3. Chest pain: Frequent, occurring at rest. Will obtain Lexiscan Cardiolite stress test. Continue SL nitroglycerin prn.  Dispo: f/u 6 weeks with APP.  Kate Sable, M.D., F.A.C.C.

## 2015-09-16 NOTE — Patient Instructions (Signed)
Your physician recommends that you schedule a follow-up appointment in: 6 weeks with Dr Bronson Ing    Your physician recommends that you continue on your current medications as directed. Please refer to the Current Medication list given to you today.   If you need a refill on your cardiac medications before your next appointment, please call your pharmacy.    Your physician has requested that you have a lexiscan myoview. For further information please visit [email or web address]. Please follow instruction sheet, as given.     Thank you for choosing [facility] !

## 2015-09-17 DIAGNOSIS — M170 Bilateral primary osteoarthritis of knee: Secondary | ICD-10-CM | POA: Diagnosis not present

## 2015-09-17 DIAGNOSIS — M25562 Pain in left knee: Secondary | ICD-10-CM | POA: Diagnosis not present

## 2015-09-17 DIAGNOSIS — R262 Difficulty in walking, not elsewhere classified: Secondary | ICD-10-CM | POA: Diagnosis not present

## 2015-09-17 DIAGNOSIS — M1712 Unilateral primary osteoarthritis, left knee: Secondary | ICD-10-CM | POA: Diagnosis not present

## 2015-09-17 DIAGNOSIS — M25561 Pain in right knee: Secondary | ICD-10-CM | POA: Diagnosis not present

## 2015-09-23 ENCOUNTER — Ambulatory Visit: Payer: Commercial Managed Care - HMO | Admitting: Cardiology

## 2015-09-25 ENCOUNTER — Encounter (HOSPITAL_COMMUNITY): Payer: Commercial Managed Care - HMO

## 2015-09-25 ENCOUNTER — Encounter (HOSPITAL_COMMUNITY): Admission: RE | Admit: 2015-09-25 | Payer: Commercial Managed Care - HMO | Source: Ambulatory Visit

## 2015-09-25 ENCOUNTER — Ambulatory Visit (HOSPITAL_COMMUNITY): Payer: Commercial Managed Care - HMO

## 2015-10-02 ENCOUNTER — Encounter (HOSPITAL_COMMUNITY)
Admission: RE | Admit: 2015-10-02 | Discharge: 2015-10-02 | Disposition: A | Discharge status: Home / Self Care | Payer: Commercial Managed Care - HMO | Source: Ambulatory Visit | Attending: Cardiovascular Disease | Admitting: Cardiovascular Disease

## 2015-10-02 ENCOUNTER — Inpatient Hospital Stay (HOSPITAL_COMMUNITY): Admission: RE | Admit: 2015-10-02 | Payer: Commercial Managed Care - HMO | Source: Ambulatory Visit

## 2015-10-02 ENCOUNTER — Encounter (HOSPITAL_COMMUNITY): Payer: Self-pay

## 2015-10-02 DIAGNOSIS — R072 Precordial pain: Secondary | ICD-10-CM | POA: Diagnosis not present

## 2015-10-02 LAB — NM MYOCAR MULTI W/SPECT W/WALL MOTION / EF
CHL CUP NUCLEAR SDS: 5
CHL CUP NUCLEAR SRS: 2
CHL CUP RESTING HR STRESS: 84 {beats}/min
LV dias vol: 65 mL
LV sys vol: 33 mL
Peak HR: 106 {beats}/min
RATE: 0.29
SSS: 7
TID: 0.82

## 2015-10-02 MED ORDER — SODIUM CHLORIDE 0.9 % IJ SOLN
INTRAMUSCULAR | Status: AC
  Administered 2015-10-02: 10 mL via INTRAVENOUS
  Filled 2015-10-02: qty 3

## 2015-10-02 MED ORDER — TECHNETIUM TC 99M SESTAMIBI - CARDIOLITE
30.0000 | Freq: Once | INTRAVENOUS | Status: AC | PRN
  Administered 2015-10-02: 12:00:00 30 via INTRAVENOUS

## 2015-10-02 MED ORDER — TECHNETIUM TC 99M SESTAMIBI GENERIC - CARDIOLITE
10.0000 | Freq: Once | INTRAVENOUS | Status: AC | PRN
  Administered 2015-10-02: 10.1 via INTRAVENOUS

## 2015-10-02 MED ORDER — REGADENOSON 0.4 MG/5ML IV SOLN
INTRAVENOUS | Status: AC
  Administered 2015-10-02: 0.4 mg via INTRAVENOUS
  Filled 2015-10-02: qty 5

## 2015-10-16 DIAGNOSIS — M1712 Unilateral primary osteoarthritis, left knee: Secondary | ICD-10-CM | POA: Diagnosis not present

## 2015-10-16 DIAGNOSIS — M25562 Pain in left knee: Secondary | ICD-10-CM | POA: Diagnosis not present

## 2015-10-22 DIAGNOSIS — J449 Chronic obstructive pulmonary disease, unspecified: Secondary | ICD-10-CM | POA: Diagnosis not present

## 2015-10-22 DIAGNOSIS — I951 Orthostatic hypotension: Secondary | ICD-10-CM | POA: Diagnosis not present

## 2015-10-22 DIAGNOSIS — R296 Repeated falls: Secondary | ICD-10-CM | POA: Diagnosis not present

## 2015-10-22 DIAGNOSIS — I10 Essential (primary) hypertension: Secondary | ICD-10-CM | POA: Diagnosis not present

## 2015-10-23 ENCOUNTER — Ambulatory Visit: Payer: Commercial Managed Care - HMO | Admitting: Physician Assistant

## 2015-10-23 DIAGNOSIS — M25562 Pain in left knee: Secondary | ICD-10-CM | POA: Diagnosis not present

## 2015-10-23 DIAGNOSIS — M1712 Unilateral primary osteoarthritis, left knee: Secondary | ICD-10-CM | POA: Diagnosis not present

## 2015-10-25 ENCOUNTER — Ambulatory Visit: Payer: Commercial Managed Care - HMO | Admitting: Adult Health

## 2015-10-28 DIAGNOSIS — H353222 Exudative age-related macular degeneration, left eye, with inactive choroidal neovascularization: Secondary | ICD-10-CM | POA: Diagnosis not present

## 2015-10-29 ENCOUNTER — Ambulatory Visit (INDEPENDENT_AMBULATORY_CARE_PROVIDER_SITE_OTHER): Payer: Commercial Managed Care - HMO | Admitting: Adult Health

## 2015-10-29 ENCOUNTER — Encounter: Payer: Self-pay | Admitting: Adult Health

## 2015-10-29 VITALS — BP 142/84 | HR 100 | Ht 63.0 in | Wt 202.0 lb

## 2015-10-29 DIAGNOSIS — I10 Essential (primary) hypertension: Secondary | ICD-10-CM | POA: Diagnosis not present

## 2015-10-29 DIAGNOSIS — I5032 Chronic diastolic (congestive) heart failure: Secondary | ICD-10-CM

## 2015-10-29 DIAGNOSIS — R0789 Other chest pain: Secondary | ICD-10-CM | POA: Diagnosis not present

## 2015-10-29 NOTE — Progress Notes (Signed)
Cardiology Office Note   Date:  10/29/2015   ID:  Kristie Morris, DOB [birth year], MRN [id number]  PCP:  Alonza Bogus, MD  Cardiologist: Woodroe Chen, NP   No chief complaint on file.     History of Present Illness: Kristie Morris is a 82 y.o. female who presents for ongoing assessment and management of chronic diastolic heart failure and malignant hypertension. She has had a CVA in the past as well.  NM stress test on 10/02/2015:  No T wave inversion was noted during stress.  The study is normal.  This is a low risk study.  Nuclear stress EF: 50%.  She comes today without cardiac complaints. She has recently had injection of her left eye for glaucoma.   Past Medical History  Diagnosis Date  . Asthma   . COPD (chronic obstructive pulmonary disease) ([place])   . Heart murmur   . GERD (gastroesophageal reflux disease)   . Chronic lower back pain   . Anxiety   . Malignant hypertension   . History of CVA with residual deficit     06/ 2015  residual left hemiparesis  . Chronic headache   . Fibromyalgia   . OAB (overactive bladder)   . Sciatica of right side 05/29/2015  . Arthritis     knee  . History of small bowel obstruction     04-25-2015  resolved without surgical intervention  . Gait disorder     uses walker  . History of colitis     2001  . History of Clostridium difficile   . Schatzki's ring   . History of esophageal dilatation   . History of hiatal hernia   . Duodenal diverticulum   . Urge and stress incontinence   . Lesion of bladder   . History of falling     last fall 06-25-2015  . History of breast cancer   . Diastolic CHF, chronic ([place])   . CAD (coronary artery disease) cardiologist-  dr Bronson Ing  ([facility] in [place])    2003  Cath with nonobstructive CAD  . Venous insufficiency, peripheral   . Varicose veins   . Edema of left lower extremity   . Macular degeneration of both eyes     right eye now low vision/  left  eye getting injections currently  . Low vision, one eye     right eye  . Full dentures     Past Surgical History  Procedure Laterality Date  . Mastectomy, partial Right 1999  . Knee arthroscopy Right 2005  . Ovarian cyst removal  1946  . Hemorrhoid surgery  1958  . Anterior cervical decomp/discectomy fusion  1971    "used bone off of my right hip"  . Abdominal adhesion surgery  1993    from colon  . Breast lumpectomy Left 2006  . Patella fracture surgery Left 2008  . Cardiac catheterization  11-29-2001  dr Tressia Miners turner    Non-obstructive CAD/  20% pLAD,  30% pD1, 50% mRCA/  normal LVF  . Transthoracic echocardiogram  01-09-2015    mild LVH,  ef [id number], grade I diastolic dysfunction/  mild MR/  trivial pericardial effusion was identified  . Cataract extraction w/ intraocular lens  implant, bilateral  right 2008//  left 2015  . Dilation and curettage of uterus  1953     miscarriage  . Abdominal hysterectomy  1954    w/ bilateral salpingoophorectomy  . Cystoscopy with hydrodistension and biopsy N/A 07/23/2015  Procedure: CYSTOSCOPY;HYDRODISTENSION;  Surgeon: Irine Seal, MD;  Location: [facility];  Service: Urology;  Laterality: N/A;     Current Outpatient Prescriptions  Medication Sig Dispense Refill  . ADVAIR DISKUS 100-50 MCG/DOSE AEPB     . albuterol (PROVENTIL HFA;VENTOLIN HFA) 108 (90 BASE) MCG/ACT inhaler Inhale 2 puffs into the lungs every 6 (six) hours as needed for wheezing or shortness of breath.    . ALPRAZolam (XANAX) 0.25 MG tablet Take 0.25 mg by mouth 2 (two) times daily as needed for anxiety.    Marland Kitchen aspirin EC 81 MG tablet Take 81 mg by mouth daily.    . Cyanocobalamin 2500 MCG CHEW Chew 1 tablet by mouth daily.    Marland Kitchen dicyclomine (BENTYL) 20 MG tablet Take 20 mg by mouth every morning.     Marland Kitchen esomeprazole (NEXIUM) 20 MG capsule Take 20 mg by mouth every morning.     . Fluticasone-Salmeterol (ADVAIR) 250-50 MCG/DOSE AEPB Inhale 2 puffs into the lungs  every evening.     . gabapentin (NEURONTIN) 100 MG capsule TAKE (1) CAPSULE BY MOUTH THREE TIMES DAILY 90 capsule 1  . hydrochlorothiazide (HYDRODIURIL) 25 MG tablet     . ibuprofen (ADVIL,MOTRIN) 200 MG tablet Take 200 mg by mouth every 6 (six) hours as needed for mild pain or moderate pain.     . Multiple Vitamins-Minerals (PRESERVISION AREDS PO) Take 1 tablet by mouth daily.    . nitroGLYCERIN (NITROSTAT) 0.4 MG SL tablet     . Polyethylene Glycol 3350 (MIRALAX PO) Take by mouth daily.    . Probiotic Product (ALIGN PO) Take 1 capsule by mouth daily.    Marland Kitchen topiramate (TOPAMAX) 25 MG tablet Take 1 tablet (25 mg total) by mouth at bedtime. (Patient taking differently: Take 50 mg by mouth at bedtime. ) 30 tablet 3  . topiramate (TOPAMAX) 50 MG tablet      No current facility-administered medications for this visit.    Allergies:   Zithromax; Celebrex; Ciprofloxacin; Metronidazole; Morphine and related; Moxifloxacin; Penicillins; Polymyxin b; Sulfa antibiotics; Vancomycin; and Vioxx    Social History:  The patient  reports that she quit smoking about 28 years ago. Her smoking use included Cigarettes. She has a 7.5 pack-year smoking history. She has never used smokeless tobacco. She reports that she does not drink alcohol or use illicit drugs.   Family History:  The patient's family history includes CAD in her mother; Cancer - Colon in her father; Cancer - Ovarian in her sister; Heart attack in her mother and sister.    ROS: All other systems are reviewed and negative. Unless otherwise mentioned in H&P    PHYSICAL EXAM: VS:  BP 142/84 mmHg  Pulse 100  Ht 5\' 3"  (1.6 m)  Wt 202 lb (91.627 kg)  BMI 35.79 kg/m2  SpO2 94% , BMI Body mass index is 35.79 kg/(m^2). GEN: Well nourished, well developed, in no acute distress HEENT: normal Neck: no JVD, carotid bruits, or masses Cardiac: RRR; no murmurs, rubs, or gallops,no edema  Respiratory:  clear to auscultation bilaterally, normal work of  breathing GI: soft, nontender, nondistended, + BS MS: no deformity or atrophy Skin: warm and dry, no rash Neuro:  Strength and sensation are intact Psych: euthymic mood, full affect   Recent Labs: 01/08/2015: B Natriuretic Peptide 75.9; Magnesium 1.9 04/23/2015: ALT 16 04/27/2015: Platelets 334 07/23/2015: BUN 22*; Creatinine, Ser 1.20*; Hemoglobin 12.9; Potassium 3.8; Sodium 143    Lipid Panel No results found for: CHOL, TRIG,  HDL, CHOLHDL, VLDL, LDLCALC, LDLDIRECT    Wt Readings from Last 3 Encounters:  10/29/15 202 lb (91.627 kg)  09/16/15 200 lb 3.2 oz (90.81 kg)  07/23/15 195 lb (88.451 kg)     ASSESSMENT AND PLAN:  1.Chest Pain: Low risk stress test. Discussed results and have given her a copy of this. She wishes to be seen yearly now. She will continue current medication regimen and follow up with her PCP. Happy to see her sooner if she has symptoms.   2. Hypertension: Normally lower than today but she is having pain in her left eye from injections.   3. Hx of Diastolic CHF: She is well compensated and has no over signs of fluid retention.     Current medicines are reviewed at length with the patient today.    Labs/ tests ordered today include:  No orders of the defined types were placed in this encounter.     Disposition:   FU with one year.    Signed, Jory Sims, NP  10/29/2015 3:22 PM    [facility] [address], [place], [place] [address] Phone: [phone]; Fax: [phone]

## 2015-10-29 NOTE — Patient Instructions (Signed)

## 2015-10-29 NOTE — Progress Notes (Deleted)
Name: Kristie Morris    DOB: 1925-05-05  Age: 82 y.o.  MR#: [id number]       PCP:  Alonza Bogus, MD      Insurance: Payor: [employer] MEDICARE / Plan: [place] THN/NTSP / Product Type: *No Product type* /   CC:   No chief complaint on file.   VS Filed Vitals:   10/29/15 1509  BP: 142/84  Pulse: 100  Height: 5\' 3"  (1.6 m)  Weight: 202 lb (91.627 kg)  SpO2: 94%    Weights Current Weight  10/29/15 202 lb (91.627 kg)  09/16/15 200 lb 3.2 oz (90.81 kg)  07/23/15 195 lb (88.451 kg)    Blood Pressure  BP Readings from Last 3 Encounters:  10/29/15 142/84  09/16/15 128/74  07/23/15 159/70     Admit date:  (Not on file) Last encounter with RMR:  Visit date not found   Allergy Zithromax; Celebrex; Ciprofloxacin; Metronidazole; Morphine and related; Moxifloxacin; Penicillins; Polymyxin b; Sulfa antibiotics; Vancomycin; and Vioxx  Current Outpatient Prescriptions  Medication Sig Dispense Refill  . ADVAIR DISKUS 100-50 MCG/DOSE AEPB     . albuterol (PROVENTIL HFA;VENTOLIN HFA) 108 (90 BASE) MCG/ACT inhaler Inhale 2 puffs into the lungs every 6 (six) hours as needed for wheezing or shortness of breath.    . ALPRAZolam (XANAX) 0.25 MG tablet Take 0.25 mg by mouth 2 (two) times daily as needed for anxiety.    Marland Kitchen aspirin EC 81 MG tablet Take 81 mg by mouth daily.    . Cyanocobalamin 2500 MCG CHEW Chew 1 tablet by mouth daily.    Marland Kitchen dicyclomine (BENTYL) 20 MG tablet Take 20 mg by mouth every morning.     Marland Kitchen esomeprazole (NEXIUM) 20 MG capsule Take 20 mg by mouth every morning.     . Fluticasone-Salmeterol (ADVAIR) 250-50 MCG/DOSE AEPB Inhale 2 puffs into the lungs every evening.     . gabapentin (NEURONTIN) 100 MG capsule TAKE (1) CAPSULE BY MOUTH THREE TIMES DAILY 90 capsule 1  . hydrochlorothiazide (HYDRODIURIL) 25 MG tablet     . ibuprofen (ADVIL,MOTRIN) 200 MG tablet Take 200 mg by mouth every 6 (six) hours as needed for mild pain or moderate pain.     . Multiple  Vitamins-Minerals (PRESERVISION AREDS PO) Take 1 tablet by mouth daily.    . nitroGLYCERIN (NITROSTAT) 0.4 MG SL tablet     . Polyethylene Glycol 3350 (MIRALAX PO) Take by mouth daily.    . Probiotic Product (ALIGN PO) Take 1 capsule by mouth daily.    Marland Kitchen topiramate (TOPAMAX) 25 MG tablet Take 1 tablet (25 mg total) by mouth at bedtime. (Patient taking differently: Take 50 mg by mouth at bedtime. ) 30 tablet 3  . topiramate (TOPAMAX) 50 MG tablet      No current facility-administered medications for this visit.    Discontinued Meds:    Medications Discontinued During This Encounter  Medication Reason  . traMADol-acetaminophen (ULTRACET) 37.5-325 MG tablet Error    Patient Active Problem List   Diagnosis Date Noted  . Sciatica of right side 05/29/2015  . Essential hypertension 04/27/2015  . Personal history of stroke with current residual effects 04/27/2015  . Abdominal pain 04/22/2015  . SBO (small bowel obstruction) ([place]) 04/22/2015  . Headache disorder 01/17/2015  . Protein-calorie malnutrition, severe ([place]) 01/10/2015  . Abnormality of gait 01/08/2015  . Recurrent falls 01/08/2015  . Pre-syncope 01/08/2015  . Left-sided weakness 04/02/2014  . Malignant hypertension 02/02/2014  . Diastolic dysfunction  02/02/2014  . Asthma, chronic 02/02/2014  . COPD (chronic obstructive pulmonary disease) ([place]) 02/02/2014  . Anemia 02/02/2014  . Hyperlipidemia 02/02/2014  . Dyspnea 01/31/2014    LABS    Component Value Date/Time   NA 143 07/23/2015 1017   NA 140 04/27/2015 0604   NA 138 04/25/2015 0535   NA 138 03/20/2014 1540   K 3.8 07/23/2015 1017   K 3.8 04/27/2015 0604   K 3.9 04/25/2015 0535   K 4.0 03/20/2014 1540   CL 105 07/23/2015 1017   CL 103 04/27/2015 0604   CL 102 04/25/2015 0535   CL 101 03/20/2014 1540   CO2 29 04/27/2015 0604   CO2 28 04/25/2015 0535   CO2 27 04/24/2015 0524   CO2 33* 03/20/2014 1540   GLUCOSE 97 07/23/2015 1017   GLUCOSE 93 04/27/2015  0604   GLUCOSE 107* 04/25/2015 0535   GLUCOSE 92 03/20/2014 1540   BUN 22* 07/23/2015 1017   BUN 15 04/27/2015 0604   BUN 26* 04/25/2015 0535   BUN 26* 03/20/2014 1540   CREATININE 1.20* 07/23/2015 1017   CREATININE 1.16* 04/27/2015 0604   CREATININE 1.01* 04/25/2015 0535   CREATININE 0.92 06/28/2014 1552   CREATININE 1.18 03/20/2014 1540   CALCIUM 8.9 04/27/2015 0604   CALCIUM 8.5* 04/25/2015 0535   CALCIUM 8.7* 04/24/2015 0524   CALCIUM 8.8 03/20/2014 1540   GFRNONAA 41* 04/27/2015 0604   GFRNONAA 48* 04/25/2015 0535   GFRNONAA 41* 04/24/2015 0524   GFRNONAA 56* 06/28/2014 1552   GFRNONAA 41* 03/20/2014 1540   GFRAA 47* 04/27/2015 0604   GFRAA 56* 04/25/2015 0535   GFRAA 47* 04/24/2015 0524   GFRAA 65 06/28/2014 1552   GFRAA 48* 03/20/2014 1540   CMP     Component Value Date/Time   NA 143 07/23/2015 1017   NA 138 03/20/2014 1540   K 3.8 07/23/2015 1017   K 4.0 03/20/2014 1540   CL 105 07/23/2015 1017   CL 101 03/20/2014 1540   CO2 29 04/27/2015 0604   CO2 33* 03/20/2014 1540   GLUCOSE 97 07/23/2015 1017   GLUCOSE 92 03/20/2014 1540   BUN 22* 07/23/2015 1017   BUN 26* 03/20/2014 1540   CREATININE 1.20* 07/23/2015 1017   CREATININE 0.92 06/28/2014 1552   CREATININE 1.18 03/20/2014 1540   CALCIUM 8.9 04/27/2015 0604   CALCIUM 8.8 03/20/2014 1540   PROT 6.9 04/23/2015 0627   PROT 7.4 03/20/2014 1540   ALBUMIN 3.8 04/23/2015 0627   ALBUMIN 3.9 03/20/2014 1540   AST 13* 04/23/2015 0627   AST 20 03/20/2014 1540   ALT 16 04/23/2015 0627   ALT 13 03/20/2014 1540   ALKPHOS 164* 04/23/2015 0627   ALKPHOS 132* 03/20/2014 1540   BILITOT 0.6 04/23/2015 0627   BILITOT 0.5 03/20/2014 1540   GFRNONAA 41* 04/27/2015 0604   GFRNONAA 56* 06/28/2014 1552   GFRNONAA 41* 03/20/2014 1540   GFRAA 47* 04/27/2015 0604   GFRAA 65 06/28/2014 1552   GFRAA 48* 03/20/2014 1540       Component Value Date/Time   WBC 9.0 04/27/2015 0604   WBC 15.5* 04/23/2015 0627   WBC 18.3*  04/22/2015 1433   WBC 10.5 03/20/2014 1540   HGB 12.9 07/23/2015 1017   HGB 11.6* 04/27/2015 0604   HGB 13.4 04/23/2015 0627   HGB 12.7 03/20/2014 1540   HCT 38.0 07/23/2015 1017   HCT 38.0 04/27/2015 0604   HCT 42.5 04/23/2015 0627   HCT 39.3 03/20/2014 1540  MCV 86.6 04/27/2015 0604   MCV 84.3 04/23/2015 0627   MCV 83.3 04/22/2015 1433   MCV 84 03/20/2014 1540    Lipid Panel  No results found for: CHOL, TRIG, HDL, CHOLHDL, VLDL, LDLCALC, LDLDIRECT  ABG    Component Value Date/Time   TCO2 26 07/23/2015 1017     Lab Results  Component Value Date   TSH 1.080 01/31/2014   BNP (last 3 results)  Recent Labs  01/08/15 2135  BNP 75.9    ProBNP (last 3 results) No results for input(s): PROBNP in the last 8760 hours.  Cardiac Panel (last 3 results) No results for input(s): CKTOTAL, CKMB, TROPONINI, RELINDX in the last 72 hours.  Iron/TIBC/Ferritin/ %Sat    Component Value Date/Time   IRON 51 02/01/2014 1058   TIBC 408 02/01/2014 1058   FERRITIN 56 02/01/2014 1058   IRONPCTSAT 13* 02/01/2014 1058     EKG Orders placed or performed during the hospital encounter of 01/08/15  . EKG 12-Lead  . EKG 12-Lead  . ED EKG  . ED EKG  . ED EKG  . ED EKG  . EKG     Prior Assessment and Plan Problem List as of 10/29/2015      Cardiovascular and Mediastinum   Malignant hypertension   Last Assessment & Plan 06/28/2014 Office Visit Written 06/28/2014  4:09 PM by Lendon Colonel, NP    Review of her blood pressure recordings are clear, that she is hypertensive in the morning prior to taking her meds. She does not appear to be overtly edematous, it is subjectively feeling bloated. She denies constipation, but she does have some diarrhea.  I will increase amlodipine to 5 mg twice a day. Have also asked her to take Lasix every other day on Monday, Wednesdays, and Fridays. She is to be more diligent on her salt restriction.  I will check a CBC with history of anemia, and a  BMET to evaluate kidney function. She will keep track of her blood pressures over the next 2 weeks. I have given her recording sheet. We will see her in a couple weeks, to discuss her BP results and labs.      Pre-syncope   Essential hypertension     Respiratory   Asthma, chronic   COPD (chronic obstructive pulmonary disease) [facility])   Last Assessment & Plan 04/02/2014 Office Visit Written 04/02/2014  4:24 PM by Lendon Colonel, NP    Patient continues to have some dyspnea on exertion. She is obese, and known history of COPD. She was followed by Dr. Luan Pulling in the past pulmonologist, but has not seen him in several years. She is referred to Dr. Luan Pulling for ongoing management of COPD and to be reinstituted as her primary care physician. Due to the left breast pain, and lack of mammogram in several years, and gone ahead and ordered a mammogram with copy to go to Dr. Luan Pulling.         Digestive   SBO (small bowel obstruction) ([place])     Nervous and Auditory   Left-sided weakness   Last Assessment & Plan 06/28/2014 Office Visit Written 06/28/2014  4:10 PM by Lendon Colonel, NP    She has a history of a hypertensive TIA. She is being [facility] by [facility], and is planned for carotid Doppler studies and an MRI of her brain. She is followed by Dr. Jannifer Franklin      Sciatica of right side     Other  Diastolic dysfunction   Last Assessment & Plan 06/28/2014 Office Visit Written 06/28/2014  4:09 PM by Lendon Colonel, NP    Repeating the echo at this time. This is believed to be caused by uncontrolled hypertension. Continue her pressure better controlled as our main goal.      Dyspnea   Anemia   Last Assessment & Plan 06/28/2014 Office Visit Written 06/28/2014  4:10 PM by Lendon Colonel, NP    I will  check a CBC to evaluate for anemia causing issues with fluid retention.      Hyperlipidemia   Abnormality of gait   Recurrent falls   Protein-calorie malnutrition,  severe ([facility])   Headache disorder   Abdominal pain   Personal history of stroke with current residual effects       Imaging: Nm Myocar Multi W/spect W/wall Motion / Ef  10/02/2015   No T wave inversion was noted during stress.  The study is normal.  This is a low risk study.  Nuclear stress EF: 50%.

## 2015-11-04 DIAGNOSIS — F329 Major depressive disorder, single episode, unspecified: Secondary | ICD-10-CM | POA: Diagnosis not present

## 2015-11-04 DIAGNOSIS — R296 Repeated falls: Secondary | ICD-10-CM | POA: Diagnosis not present

## 2015-11-04 DIAGNOSIS — Z9181 History of falling: Secondary | ICD-10-CM | POA: Diagnosis not present

## 2015-11-04 DIAGNOSIS — J329 Chronic sinusitis, unspecified: Secondary | ICD-10-CM | POA: Diagnosis not present

## 2015-11-04 DIAGNOSIS — Z853 Personal history of malignant neoplasm of breast: Secondary | ICD-10-CM | POA: Diagnosis not present

## 2015-11-04 DIAGNOSIS — Z9013 Acquired absence of bilateral breasts and nipples: Secondary | ICD-10-CM | POA: Diagnosis not present

## 2015-11-04 DIAGNOSIS — K598 Other specified functional intestinal disorders: Secondary | ICD-10-CM | POA: Diagnosis not present

## 2015-11-04 DIAGNOSIS — M159 Polyosteoarthritis, unspecified: Secondary | ICD-10-CM | POA: Diagnosis not present

## 2015-11-07 DIAGNOSIS — R296 Repeated falls: Secondary | ICD-10-CM | POA: Diagnosis not present

## 2015-11-07 DIAGNOSIS — Z9013 Acquired absence of bilateral breasts and nipples: Secondary | ICD-10-CM | POA: Diagnosis not present

## 2015-11-07 DIAGNOSIS — Z853 Personal history of malignant neoplasm of breast: Secondary | ICD-10-CM | POA: Diagnosis not present

## 2015-11-07 DIAGNOSIS — Z9181 History of falling: Secondary | ICD-10-CM | POA: Diagnosis not present

## 2015-11-07 DIAGNOSIS — K598 Other specified functional intestinal disorders: Secondary | ICD-10-CM | POA: Diagnosis not present

## 2015-11-07 DIAGNOSIS — F329 Major depressive disorder, single episode, unspecified: Secondary | ICD-10-CM | POA: Diagnosis not present

## 2015-11-07 DIAGNOSIS — J329 Chronic sinusitis, unspecified: Secondary | ICD-10-CM | POA: Diagnosis not present

## 2015-11-07 DIAGNOSIS — M159 Polyosteoarthritis, unspecified: Secondary | ICD-10-CM | POA: Diagnosis not present

## 2015-11-08 DIAGNOSIS — Z9181 History of falling: Secondary | ICD-10-CM | POA: Diagnosis not present

## 2015-11-08 DIAGNOSIS — K598 Other specified functional intestinal disorders: Secondary | ICD-10-CM | POA: Diagnosis not present

## 2015-11-08 DIAGNOSIS — M159 Polyosteoarthritis, unspecified: Secondary | ICD-10-CM | POA: Diagnosis not present

## 2015-11-08 DIAGNOSIS — F329 Major depressive disorder, single episode, unspecified: Secondary | ICD-10-CM | POA: Diagnosis not present

## 2015-11-08 DIAGNOSIS — Z853 Personal history of malignant neoplasm of breast: Secondary | ICD-10-CM | POA: Diagnosis not present

## 2015-11-08 DIAGNOSIS — R296 Repeated falls: Secondary | ICD-10-CM | POA: Diagnosis not present

## 2015-11-08 DIAGNOSIS — J329 Chronic sinusitis, unspecified: Secondary | ICD-10-CM | POA: Diagnosis not present

## 2015-11-08 DIAGNOSIS — Z9013 Acquired absence of bilateral breasts and nipples: Secondary | ICD-10-CM | POA: Diagnosis not present

## 2015-11-11 ENCOUNTER — Other Ambulatory Visit: Payer: Self-pay | Admitting: Neurology

## 2015-11-11 DIAGNOSIS — K598 Other specified functional intestinal disorders: Secondary | ICD-10-CM | POA: Diagnosis not present

## 2015-11-11 DIAGNOSIS — Z9013 Acquired absence of bilateral breasts and nipples: Secondary | ICD-10-CM | POA: Diagnosis not present

## 2015-11-11 DIAGNOSIS — Z853 Personal history of malignant neoplasm of breast: Secondary | ICD-10-CM | POA: Diagnosis not present

## 2015-11-11 DIAGNOSIS — R296 Repeated falls: Secondary | ICD-10-CM | POA: Diagnosis not present

## 2015-11-11 DIAGNOSIS — M159 Polyosteoarthritis, unspecified: Secondary | ICD-10-CM | POA: Diagnosis not present

## 2015-11-11 DIAGNOSIS — J329 Chronic sinusitis, unspecified: Secondary | ICD-10-CM | POA: Diagnosis not present

## 2015-11-11 DIAGNOSIS — Z9181 History of falling: Secondary | ICD-10-CM | POA: Diagnosis not present

## 2015-11-11 DIAGNOSIS — F329 Major depressive disorder, single episode, unspecified: Secondary | ICD-10-CM | POA: Diagnosis not present

## 2015-11-12 DIAGNOSIS — J329 Chronic sinusitis, unspecified: Secondary | ICD-10-CM | POA: Diagnosis not present

## 2015-11-12 DIAGNOSIS — Z9013 Acquired absence of bilateral breasts and nipples: Secondary | ICD-10-CM | POA: Diagnosis not present

## 2015-11-12 DIAGNOSIS — M159 Polyosteoarthritis, unspecified: Secondary | ICD-10-CM | POA: Diagnosis not present

## 2015-11-12 DIAGNOSIS — R296 Repeated falls: Secondary | ICD-10-CM | POA: Diagnosis not present

## 2015-11-12 DIAGNOSIS — Z9181 History of falling: Secondary | ICD-10-CM | POA: Diagnosis not present

## 2015-11-12 DIAGNOSIS — Z853 Personal history of malignant neoplasm of breast: Secondary | ICD-10-CM | POA: Diagnosis not present

## 2015-11-12 DIAGNOSIS — K598 Other specified functional intestinal disorders: Secondary | ICD-10-CM | POA: Diagnosis not present

## 2015-11-12 DIAGNOSIS — F329 Major depressive disorder, single episode, unspecified: Secondary | ICD-10-CM | POA: Diagnosis not present

## 2015-11-13 DIAGNOSIS — M25561 Pain in right knee: Secondary | ICD-10-CM | POA: Diagnosis not present

## 2015-11-13 DIAGNOSIS — M1711 Unilateral primary osteoarthritis, right knee: Secondary | ICD-10-CM | POA: Diagnosis not present

## 2015-11-14 DIAGNOSIS — K598 Other specified functional intestinal disorders: Secondary | ICD-10-CM | POA: Diagnosis not present

## 2015-11-14 DIAGNOSIS — Z853 Personal history of malignant neoplasm of breast: Secondary | ICD-10-CM | POA: Diagnosis not present

## 2015-11-14 DIAGNOSIS — F329 Major depressive disorder, single episode, unspecified: Secondary | ICD-10-CM | POA: Diagnosis not present

## 2015-11-14 DIAGNOSIS — R296 Repeated falls: Secondary | ICD-10-CM | POA: Diagnosis not present

## 2015-11-14 DIAGNOSIS — Z9181 History of falling: Secondary | ICD-10-CM | POA: Diagnosis not present

## 2015-11-14 DIAGNOSIS — J329 Chronic sinusitis, unspecified: Secondary | ICD-10-CM | POA: Diagnosis not present

## 2015-11-14 DIAGNOSIS — M159 Polyosteoarthritis, unspecified: Secondary | ICD-10-CM | POA: Diagnosis not present

## 2015-11-14 DIAGNOSIS — Z9013 Acquired absence of bilateral breasts and nipples: Secondary | ICD-10-CM | POA: Diagnosis not present

## 2015-11-18 DIAGNOSIS — F329 Major depressive disorder, single episode, unspecified: Secondary | ICD-10-CM | POA: Diagnosis not present

## 2015-11-18 DIAGNOSIS — Z853 Personal history of malignant neoplasm of breast: Secondary | ICD-10-CM | POA: Diagnosis not present

## 2015-11-18 DIAGNOSIS — K598 Other specified functional intestinal disorders: Secondary | ICD-10-CM | POA: Diagnosis not present

## 2015-11-18 DIAGNOSIS — Z9013 Acquired absence of bilateral breasts and nipples: Secondary | ICD-10-CM | POA: Diagnosis not present

## 2015-11-18 DIAGNOSIS — J329 Chronic sinusitis, unspecified: Secondary | ICD-10-CM | POA: Diagnosis not present

## 2015-11-18 DIAGNOSIS — M159 Polyosteoarthritis, unspecified: Secondary | ICD-10-CM | POA: Diagnosis not present

## 2015-11-18 DIAGNOSIS — Z9181 History of falling: Secondary | ICD-10-CM | POA: Diagnosis not present

## 2015-11-18 DIAGNOSIS — R296 Repeated falls: Secondary | ICD-10-CM | POA: Diagnosis not present

## 2015-11-20 DIAGNOSIS — Z9181 History of falling: Secondary | ICD-10-CM | POA: Diagnosis not present

## 2015-11-20 DIAGNOSIS — K598 Other specified functional intestinal disorders: Secondary | ICD-10-CM | POA: Diagnosis not present

## 2015-11-20 DIAGNOSIS — J329 Chronic sinusitis, unspecified: Secondary | ICD-10-CM | POA: Diagnosis not present

## 2015-11-20 DIAGNOSIS — M159 Polyosteoarthritis, unspecified: Secondary | ICD-10-CM | POA: Diagnosis not present

## 2015-11-20 DIAGNOSIS — Z9013 Acquired absence of bilateral breasts and nipples: Secondary | ICD-10-CM | POA: Diagnosis not present

## 2015-11-20 DIAGNOSIS — F329 Major depressive disorder, single episode, unspecified: Secondary | ICD-10-CM | POA: Diagnosis not present

## 2015-11-20 DIAGNOSIS — R296 Repeated falls: Secondary | ICD-10-CM | POA: Diagnosis not present

## 2015-11-20 DIAGNOSIS — Z853 Personal history of malignant neoplasm of breast: Secondary | ICD-10-CM | POA: Diagnosis not present

## 2015-11-22 DIAGNOSIS — Z9013 Acquired absence of bilateral breasts and nipples: Secondary | ICD-10-CM | POA: Diagnosis not present

## 2015-11-22 DIAGNOSIS — Z9181 History of falling: Secondary | ICD-10-CM | POA: Diagnosis not present

## 2015-11-22 DIAGNOSIS — R296 Repeated falls: Secondary | ICD-10-CM | POA: Diagnosis not present

## 2015-11-22 DIAGNOSIS — M159 Polyosteoarthritis, unspecified: Secondary | ICD-10-CM | POA: Diagnosis not present

## 2015-11-22 DIAGNOSIS — K598 Other specified functional intestinal disorders: Secondary | ICD-10-CM | POA: Diagnosis not present

## 2015-11-22 DIAGNOSIS — Z853 Personal history of malignant neoplasm of breast: Secondary | ICD-10-CM | POA: Diagnosis not present

## 2015-11-22 DIAGNOSIS — J329 Chronic sinusitis, unspecified: Secondary | ICD-10-CM | POA: Diagnosis not present

## 2015-11-22 DIAGNOSIS — F329 Major depressive disorder, single episode, unspecified: Secondary | ICD-10-CM | POA: Diagnosis not present

## 2015-11-25 DIAGNOSIS — K598 Other specified functional intestinal disorders: Secondary | ICD-10-CM | POA: Diagnosis not present

## 2015-11-25 DIAGNOSIS — R296 Repeated falls: Secondary | ICD-10-CM | POA: Diagnosis not present

## 2015-11-25 DIAGNOSIS — F329 Major depressive disorder, single episode, unspecified: Secondary | ICD-10-CM | POA: Diagnosis not present

## 2015-11-25 DIAGNOSIS — Z9013 Acquired absence of bilateral breasts and nipples: Secondary | ICD-10-CM | POA: Diagnosis not present

## 2015-11-25 DIAGNOSIS — J329 Chronic sinusitis, unspecified: Secondary | ICD-10-CM | POA: Diagnosis not present

## 2015-11-25 DIAGNOSIS — M159 Polyosteoarthritis, unspecified: Secondary | ICD-10-CM | POA: Diagnosis not present

## 2015-11-25 DIAGNOSIS — Z9181 History of falling: Secondary | ICD-10-CM | POA: Diagnosis not present

## 2015-11-25 DIAGNOSIS — Z853 Personal history of malignant neoplasm of breast: Secondary | ICD-10-CM | POA: Diagnosis not present

## 2015-11-26 DIAGNOSIS — Z853 Personal history of malignant neoplasm of breast: Secondary | ICD-10-CM | POA: Diagnosis not present

## 2015-11-26 DIAGNOSIS — J329 Chronic sinusitis, unspecified: Secondary | ICD-10-CM | POA: Diagnosis not present

## 2015-11-26 DIAGNOSIS — Z9181 History of falling: Secondary | ICD-10-CM | POA: Diagnosis not present

## 2015-11-26 DIAGNOSIS — M159 Polyosteoarthritis, unspecified: Secondary | ICD-10-CM | POA: Diagnosis not present

## 2015-11-26 DIAGNOSIS — F329 Major depressive disorder, single episode, unspecified: Secondary | ICD-10-CM | POA: Diagnosis not present

## 2015-11-26 DIAGNOSIS — R296 Repeated falls: Secondary | ICD-10-CM | POA: Diagnosis not present

## 2015-11-26 DIAGNOSIS — K598 Other specified functional intestinal disorders: Secondary | ICD-10-CM | POA: Diagnosis not present

## 2015-11-26 DIAGNOSIS — Z9013 Acquired absence of bilateral breasts and nipples: Secondary | ICD-10-CM | POA: Diagnosis not present

## 2015-11-28 DIAGNOSIS — M159 Polyosteoarthritis, unspecified: Secondary | ICD-10-CM | POA: Diagnosis not present

## 2015-11-28 DIAGNOSIS — R296 Repeated falls: Secondary | ICD-10-CM | POA: Diagnosis not present

## 2015-11-28 DIAGNOSIS — F329 Major depressive disorder, single episode, unspecified: Secondary | ICD-10-CM | POA: Diagnosis not present

## 2015-11-28 DIAGNOSIS — K598 Other specified functional intestinal disorders: Secondary | ICD-10-CM | POA: Diagnosis not present

## 2015-11-28 DIAGNOSIS — Z9181 History of falling: Secondary | ICD-10-CM | POA: Diagnosis not present

## 2015-11-28 DIAGNOSIS — Z9013 Acquired absence of bilateral breasts and nipples: Secondary | ICD-10-CM | POA: Diagnosis not present

## 2015-11-28 DIAGNOSIS — Z853 Personal history of malignant neoplasm of breast: Secondary | ICD-10-CM | POA: Diagnosis not present

## 2015-11-28 DIAGNOSIS — J329 Chronic sinusitis, unspecified: Secondary | ICD-10-CM | POA: Diagnosis not present

## 2015-12-02 DIAGNOSIS — G8190 Hemiplegia, unspecified affecting unspecified side: Secondary | ICD-10-CM | POA: Diagnosis not present

## 2015-12-02 DIAGNOSIS — J449 Chronic obstructive pulmonary disease, unspecified: Secondary | ICD-10-CM | POA: Diagnosis not present

## 2015-12-02 DIAGNOSIS — N3281 Overactive bladder: Secondary | ICD-10-CM | POA: Diagnosis not present

## 2015-12-02 DIAGNOSIS — I10 Essential (primary) hypertension: Secondary | ICD-10-CM | POA: Diagnosis not present

## 2015-12-03 DIAGNOSIS — Z9181 History of falling: Secondary | ICD-10-CM | POA: Diagnosis not present

## 2015-12-03 DIAGNOSIS — M159 Polyosteoarthritis, unspecified: Secondary | ICD-10-CM | POA: Diagnosis not present

## 2015-12-03 DIAGNOSIS — R296 Repeated falls: Secondary | ICD-10-CM | POA: Diagnosis not present

## 2015-12-03 DIAGNOSIS — J329 Chronic sinusitis, unspecified: Secondary | ICD-10-CM | POA: Diagnosis not present

## 2015-12-03 DIAGNOSIS — Z9013 Acquired absence of bilateral breasts and nipples: Secondary | ICD-10-CM | POA: Diagnosis not present

## 2015-12-03 DIAGNOSIS — Z853 Personal history of malignant neoplasm of breast: Secondary | ICD-10-CM | POA: Diagnosis not present

## 2015-12-03 DIAGNOSIS — F329 Major depressive disorder, single episode, unspecified: Secondary | ICD-10-CM | POA: Diagnosis not present

## 2015-12-03 DIAGNOSIS — K598 Other specified functional intestinal disorders: Secondary | ICD-10-CM | POA: Diagnosis not present

## 2015-12-05 DIAGNOSIS — F329 Major depressive disorder, single episode, unspecified: Secondary | ICD-10-CM | POA: Diagnosis not present

## 2015-12-05 DIAGNOSIS — Z9181 History of falling: Secondary | ICD-10-CM | POA: Diagnosis not present

## 2015-12-05 DIAGNOSIS — R296 Repeated falls: Secondary | ICD-10-CM | POA: Diagnosis not present

## 2015-12-05 DIAGNOSIS — Z9013 Acquired absence of bilateral breasts and nipples: Secondary | ICD-10-CM | POA: Diagnosis not present

## 2015-12-05 DIAGNOSIS — K598 Other specified functional intestinal disorders: Secondary | ICD-10-CM | POA: Diagnosis not present

## 2015-12-05 DIAGNOSIS — M159 Polyosteoarthritis, unspecified: Secondary | ICD-10-CM | POA: Diagnosis not present

## 2015-12-05 DIAGNOSIS — Z853 Personal history of malignant neoplasm of breast: Secondary | ICD-10-CM | POA: Diagnosis not present

## 2015-12-05 DIAGNOSIS — J329 Chronic sinusitis, unspecified: Secondary | ICD-10-CM | POA: Diagnosis not present

## 2015-12-10 DIAGNOSIS — Z853 Personal history of malignant neoplasm of breast: Secondary | ICD-10-CM | POA: Diagnosis not present

## 2015-12-10 DIAGNOSIS — Z9181 History of falling: Secondary | ICD-10-CM | POA: Diagnosis not present

## 2015-12-10 DIAGNOSIS — R296 Repeated falls: Secondary | ICD-10-CM | POA: Diagnosis not present

## 2015-12-10 DIAGNOSIS — M159 Polyosteoarthritis, unspecified: Secondary | ICD-10-CM | POA: Diagnosis not present

## 2015-12-10 DIAGNOSIS — J329 Chronic sinusitis, unspecified: Secondary | ICD-10-CM | POA: Diagnosis not present

## 2015-12-10 DIAGNOSIS — K598 Other specified functional intestinal disorders: Secondary | ICD-10-CM | POA: Diagnosis not present

## 2015-12-10 DIAGNOSIS — Z9013 Acquired absence of bilateral breasts and nipples: Secondary | ICD-10-CM | POA: Diagnosis not present

## 2015-12-10 DIAGNOSIS — F329 Major depressive disorder, single episode, unspecified: Secondary | ICD-10-CM | POA: Diagnosis not present

## 2015-12-12 DIAGNOSIS — Z9013 Acquired absence of bilateral breasts and nipples: Secondary | ICD-10-CM | POA: Diagnosis not present

## 2015-12-12 DIAGNOSIS — Z853 Personal history of malignant neoplasm of breast: Secondary | ICD-10-CM | POA: Diagnosis not present

## 2015-12-12 DIAGNOSIS — R296 Repeated falls: Secondary | ICD-10-CM | POA: Diagnosis not present

## 2015-12-12 DIAGNOSIS — F329 Major depressive disorder, single episode, unspecified: Secondary | ICD-10-CM | POA: Diagnosis not present

## 2015-12-12 DIAGNOSIS — M159 Polyosteoarthritis, unspecified: Secondary | ICD-10-CM | POA: Diagnosis not present

## 2015-12-12 DIAGNOSIS — Z9181 History of falling: Secondary | ICD-10-CM | POA: Diagnosis not present

## 2015-12-12 DIAGNOSIS — J329 Chronic sinusitis, unspecified: Secondary | ICD-10-CM | POA: Diagnosis not present

## 2015-12-12 DIAGNOSIS — K598 Other specified functional intestinal disorders: Secondary | ICD-10-CM | POA: Diagnosis not present

## 2015-12-18 DIAGNOSIS — M159 Polyosteoarthritis, unspecified: Secondary | ICD-10-CM | POA: Diagnosis not present

## 2015-12-18 DIAGNOSIS — Z9181 History of falling: Secondary | ICD-10-CM | POA: Diagnosis not present

## 2015-12-18 DIAGNOSIS — Z853 Personal history of malignant neoplasm of breast: Secondary | ICD-10-CM | POA: Diagnosis not present

## 2015-12-18 DIAGNOSIS — R296 Repeated falls: Secondary | ICD-10-CM | POA: Diagnosis not present

## 2015-12-18 DIAGNOSIS — Z9013 Acquired absence of bilateral breasts and nipples: Secondary | ICD-10-CM | POA: Diagnosis not present

## 2015-12-18 DIAGNOSIS — F329 Major depressive disorder, single episode, unspecified: Secondary | ICD-10-CM | POA: Diagnosis not present

## 2015-12-18 DIAGNOSIS — K598 Other specified functional intestinal disorders: Secondary | ICD-10-CM | POA: Diagnosis not present

## 2015-12-18 DIAGNOSIS — J329 Chronic sinusitis, unspecified: Secondary | ICD-10-CM | POA: Diagnosis not present

## 2015-12-24 DIAGNOSIS — K598 Other specified functional intestinal disorders: Secondary | ICD-10-CM | POA: Diagnosis not present

## 2015-12-24 DIAGNOSIS — Z853 Personal history of malignant neoplasm of breast: Secondary | ICD-10-CM | POA: Diagnosis not present

## 2015-12-24 DIAGNOSIS — F329 Major depressive disorder, single episode, unspecified: Secondary | ICD-10-CM | POA: Diagnosis not present

## 2015-12-24 DIAGNOSIS — Z9181 History of falling: Secondary | ICD-10-CM | POA: Diagnosis not present

## 2015-12-24 DIAGNOSIS — Z9013 Acquired absence of bilateral breasts and nipples: Secondary | ICD-10-CM | POA: Diagnosis not present

## 2015-12-24 DIAGNOSIS — R296 Repeated falls: Secondary | ICD-10-CM | POA: Diagnosis not present

## 2015-12-24 DIAGNOSIS — J329 Chronic sinusitis, unspecified: Secondary | ICD-10-CM | POA: Diagnosis not present

## 2015-12-24 DIAGNOSIS — M159 Polyosteoarthritis, unspecified: Secondary | ICD-10-CM | POA: Diagnosis not present

## 2015-12-30 DIAGNOSIS — M159 Polyosteoarthritis, unspecified: Secondary | ICD-10-CM | POA: Diagnosis not present

## 2015-12-30 DIAGNOSIS — Z9181 History of falling: Secondary | ICD-10-CM | POA: Diagnosis not present

## 2015-12-30 DIAGNOSIS — K598 Other specified functional intestinal disorders: Secondary | ICD-10-CM | POA: Diagnosis not present

## 2015-12-30 DIAGNOSIS — F329 Major depressive disorder, single episode, unspecified: Secondary | ICD-10-CM | POA: Diagnosis not present

## 2015-12-30 DIAGNOSIS — J329 Chronic sinusitis, unspecified: Secondary | ICD-10-CM | POA: Diagnosis not present

## 2015-12-30 DIAGNOSIS — Z853 Personal history of malignant neoplasm of breast: Secondary | ICD-10-CM | POA: Diagnosis not present

## 2015-12-30 DIAGNOSIS — Z9013 Acquired absence of bilateral breasts and nipples: Secondary | ICD-10-CM | POA: Diagnosis not present

## 2015-12-30 DIAGNOSIS — R296 Repeated falls: Secondary | ICD-10-CM | POA: Diagnosis not present

## 2015-12-31 DIAGNOSIS — Z9013 Acquired absence of bilateral breasts and nipples: Secondary | ICD-10-CM | POA: Diagnosis not present

## 2015-12-31 DIAGNOSIS — J329 Chronic sinusitis, unspecified: Secondary | ICD-10-CM | POA: Diagnosis not present

## 2015-12-31 DIAGNOSIS — F329 Major depressive disorder, single episode, unspecified: Secondary | ICD-10-CM | POA: Diagnosis not present

## 2015-12-31 DIAGNOSIS — R296 Repeated falls: Secondary | ICD-10-CM | POA: Diagnosis not present

## 2015-12-31 DIAGNOSIS — M159 Polyosteoarthritis, unspecified: Secondary | ICD-10-CM | POA: Diagnosis not present

## 2015-12-31 DIAGNOSIS — Z853 Personal history of malignant neoplasm of breast: Secondary | ICD-10-CM | POA: Diagnosis not present

## 2015-12-31 DIAGNOSIS — K598 Other specified functional intestinal disorders: Secondary | ICD-10-CM | POA: Diagnosis not present

## 2015-12-31 DIAGNOSIS — Z9181 History of falling: Secondary | ICD-10-CM | POA: Diagnosis not present

## 2016-01-03 DIAGNOSIS — R296 Repeated falls: Secondary | ICD-10-CM | POA: Diagnosis not present

## 2016-01-03 DIAGNOSIS — Z9181 History of falling: Secondary | ICD-10-CM | POA: Diagnosis not present

## 2016-01-03 DIAGNOSIS — K598 Other specified functional intestinal disorders: Secondary | ICD-10-CM | POA: Diagnosis not present

## 2016-01-03 DIAGNOSIS — M159 Polyosteoarthritis, unspecified: Secondary | ICD-10-CM | POA: Diagnosis not present

## 2016-01-06 DIAGNOSIS — Z853 Personal history of malignant neoplasm of breast: Secondary | ICD-10-CM | POA: Diagnosis not present

## 2016-01-06 DIAGNOSIS — R296 Repeated falls: Secondary | ICD-10-CM | POA: Diagnosis not present

## 2016-01-06 DIAGNOSIS — M159 Polyosteoarthritis, unspecified: Secondary | ICD-10-CM | POA: Diagnosis not present

## 2016-01-06 DIAGNOSIS — Z9013 Acquired absence of bilateral breasts and nipples: Secondary | ICD-10-CM | POA: Diagnosis not present

## 2016-01-06 DIAGNOSIS — F329 Major depressive disorder, single episode, unspecified: Secondary | ICD-10-CM | POA: Diagnosis not present

## 2016-01-06 DIAGNOSIS — K598 Other specified functional intestinal disorders: Secondary | ICD-10-CM | POA: Diagnosis not present

## 2016-01-06 DIAGNOSIS — Z9181 History of falling: Secondary | ICD-10-CM | POA: Diagnosis not present

## 2016-01-06 DIAGNOSIS — J329 Chronic sinusitis, unspecified: Secondary | ICD-10-CM | POA: Diagnosis not present

## 2016-01-09 DIAGNOSIS — Z853 Personal history of malignant neoplasm of breast: Secondary | ICD-10-CM | POA: Diagnosis not present

## 2016-01-09 DIAGNOSIS — J329 Chronic sinusitis, unspecified: Secondary | ICD-10-CM | POA: Diagnosis not present

## 2016-01-09 DIAGNOSIS — K598 Other specified functional intestinal disorders: Secondary | ICD-10-CM | POA: Diagnosis not present

## 2016-01-09 DIAGNOSIS — R296 Repeated falls: Secondary | ICD-10-CM | POA: Diagnosis not present

## 2016-01-09 DIAGNOSIS — M159 Polyosteoarthritis, unspecified: Secondary | ICD-10-CM | POA: Diagnosis not present

## 2016-01-09 DIAGNOSIS — Z9181 History of falling: Secondary | ICD-10-CM | POA: Diagnosis not present

## 2016-01-09 DIAGNOSIS — Z9013 Acquired absence of bilateral breasts and nipples: Secondary | ICD-10-CM | POA: Diagnosis not present

## 2016-01-09 DIAGNOSIS — F329 Major depressive disorder, single episode, unspecified: Secondary | ICD-10-CM | POA: Diagnosis not present

## 2016-01-13 DIAGNOSIS — K598 Other specified functional intestinal disorders: Secondary | ICD-10-CM | POA: Diagnosis not present

## 2016-01-13 DIAGNOSIS — F329 Major depressive disorder, single episode, unspecified: Secondary | ICD-10-CM | POA: Diagnosis not present

## 2016-01-13 DIAGNOSIS — M159 Polyosteoarthritis, unspecified: Secondary | ICD-10-CM | POA: Diagnosis not present

## 2016-01-13 DIAGNOSIS — Z9181 History of falling: Secondary | ICD-10-CM | POA: Diagnosis not present

## 2016-01-13 DIAGNOSIS — Z853 Personal history of malignant neoplasm of breast: Secondary | ICD-10-CM | POA: Diagnosis not present

## 2016-01-13 DIAGNOSIS — Z9013 Acquired absence of bilateral breasts and nipples: Secondary | ICD-10-CM | POA: Diagnosis not present

## 2016-01-13 DIAGNOSIS — J329 Chronic sinusitis, unspecified: Secondary | ICD-10-CM | POA: Diagnosis not present

## 2016-01-13 DIAGNOSIS — R296 Repeated falls: Secondary | ICD-10-CM | POA: Diagnosis not present

## 2016-01-16 DIAGNOSIS — Z853 Personal history of malignant neoplasm of breast: Secondary | ICD-10-CM | POA: Diagnosis not present

## 2016-01-16 DIAGNOSIS — M159 Polyosteoarthritis, unspecified: Secondary | ICD-10-CM | POA: Diagnosis not present

## 2016-01-16 DIAGNOSIS — K598 Other specified functional intestinal disorders: Secondary | ICD-10-CM | POA: Diagnosis not present

## 2016-01-16 DIAGNOSIS — J329 Chronic sinusitis, unspecified: Secondary | ICD-10-CM | POA: Diagnosis not present

## 2016-01-16 DIAGNOSIS — Z9181 History of falling: Secondary | ICD-10-CM | POA: Diagnosis not present

## 2016-01-16 DIAGNOSIS — Z9013 Acquired absence of bilateral breasts and nipples: Secondary | ICD-10-CM | POA: Diagnosis not present

## 2016-01-16 DIAGNOSIS — R296 Repeated falls: Secondary | ICD-10-CM | POA: Diagnosis not present

## 2016-01-16 DIAGNOSIS — F329 Major depressive disorder, single episode, unspecified: Secondary | ICD-10-CM | POA: Diagnosis not present

## 2016-01-20 DIAGNOSIS — R296 Repeated falls: Secondary | ICD-10-CM | POA: Diagnosis not present

## 2016-01-20 DIAGNOSIS — Z9013 Acquired absence of bilateral breasts and nipples: Secondary | ICD-10-CM | POA: Diagnosis not present

## 2016-01-20 DIAGNOSIS — J329 Chronic sinusitis, unspecified: Secondary | ICD-10-CM | POA: Diagnosis not present

## 2016-01-20 DIAGNOSIS — Z9181 History of falling: Secondary | ICD-10-CM | POA: Diagnosis not present

## 2016-01-20 DIAGNOSIS — M159 Polyosteoarthritis, unspecified: Secondary | ICD-10-CM | POA: Diagnosis not present

## 2016-01-20 DIAGNOSIS — K598 Other specified functional intestinal disorders: Secondary | ICD-10-CM | POA: Diagnosis not present

## 2016-01-20 DIAGNOSIS — Z853 Personal history of malignant neoplasm of breast: Secondary | ICD-10-CM | POA: Diagnosis not present

## 2016-01-20 DIAGNOSIS — F329 Major depressive disorder, single episode, unspecified: Secondary | ICD-10-CM | POA: Diagnosis not present

## 2016-03-03 DIAGNOSIS — I10 Essential (primary) hypertension: Secondary | ICD-10-CM | POA: Diagnosis not present

## 2016-03-03 DIAGNOSIS — J449 Chronic obstructive pulmonary disease, unspecified: Secondary | ICD-10-CM | POA: Diagnosis not present

## 2016-03-03 DIAGNOSIS — N3281 Overactive bladder: Secondary | ICD-10-CM | POA: Diagnosis not present

## 2016-03-03 DIAGNOSIS — I951 Orthostatic hypotension: Secondary | ICD-10-CM | POA: Diagnosis not present

## 2016-03-13 DIAGNOSIS — H353221 Exudative age-related macular degeneration, left eye, with active choroidal neovascularization: Secondary | ICD-10-CM | POA: Diagnosis not present

## 2016-03-16 DIAGNOSIS — R531 Weakness: Secondary | ICD-10-CM | POA: Diagnosis not present

## 2016-03-16 DIAGNOSIS — R1310 Dysphagia, unspecified: Secondary | ICD-10-CM | POA: Diagnosis not present

## 2016-03-16 DIAGNOSIS — M1990 Unspecified osteoarthritis, unspecified site: Secondary | ICD-10-CM | POA: Diagnosis not present

## 2016-03-16 DIAGNOSIS — I69354 Hemiplegia and hemiparesis following cerebral infarction affecting left non-dominant side: Secondary | ICD-10-CM | POA: Diagnosis not present

## 2016-03-16 DIAGNOSIS — Z853 Personal history of malignant neoplasm of breast: Secondary | ICD-10-CM | POA: Diagnosis not present

## 2016-03-16 DIAGNOSIS — K5090 Crohn's disease, unspecified, without complications: Secondary | ICD-10-CM | POA: Diagnosis not present

## 2016-03-16 DIAGNOSIS — I951 Orthostatic hypotension: Secondary | ICD-10-CM | POA: Diagnosis not present

## 2016-03-16 DIAGNOSIS — I69398 Other sequelae of cerebral infarction: Secondary | ICD-10-CM | POA: Diagnosis not present

## 2016-03-16 DIAGNOSIS — I69391 Dysphagia following cerebral infarction: Secondary | ICD-10-CM | POA: Diagnosis not present

## 2016-03-18 DIAGNOSIS — R1310 Dysphagia, unspecified: Secondary | ICD-10-CM | POA: Diagnosis not present

## 2016-03-18 DIAGNOSIS — Z853 Personal history of malignant neoplasm of breast: Secondary | ICD-10-CM | POA: Diagnosis not present

## 2016-03-18 DIAGNOSIS — K5090 Crohn's disease, unspecified, without complications: Secondary | ICD-10-CM | POA: Diagnosis not present

## 2016-03-18 DIAGNOSIS — I951 Orthostatic hypotension: Secondary | ICD-10-CM | POA: Diagnosis not present

## 2016-03-18 DIAGNOSIS — M1990 Unspecified osteoarthritis, unspecified site: Secondary | ICD-10-CM | POA: Diagnosis not present

## 2016-03-18 DIAGNOSIS — I69391 Dysphagia following cerebral infarction: Secondary | ICD-10-CM | POA: Diagnosis not present

## 2016-03-18 DIAGNOSIS — I69354 Hemiplegia and hemiparesis following cerebral infarction affecting left non-dominant side: Secondary | ICD-10-CM | POA: Diagnosis not present

## 2016-03-18 DIAGNOSIS — I69398 Other sequelae of cerebral infarction: Secondary | ICD-10-CM | POA: Diagnosis not present

## 2016-03-18 DIAGNOSIS — R531 Weakness: Secondary | ICD-10-CM | POA: Diagnosis not present

## 2016-03-19 DIAGNOSIS — R531 Weakness: Secondary | ICD-10-CM | POA: Diagnosis not present

## 2016-03-19 DIAGNOSIS — I69354 Hemiplegia and hemiparesis following cerebral infarction affecting left non-dominant side: Secondary | ICD-10-CM | POA: Diagnosis not present

## 2016-03-19 DIAGNOSIS — R1310 Dysphagia, unspecified: Secondary | ICD-10-CM | POA: Diagnosis not present

## 2016-03-19 DIAGNOSIS — I69391 Dysphagia following cerebral infarction: Secondary | ICD-10-CM | POA: Diagnosis not present

## 2016-03-19 DIAGNOSIS — M1990 Unspecified osteoarthritis, unspecified site: Secondary | ICD-10-CM | POA: Diagnosis not present

## 2016-03-19 DIAGNOSIS — K5090 Crohn's disease, unspecified, without complications: Secondary | ICD-10-CM | POA: Diagnosis not present

## 2016-03-19 DIAGNOSIS — Z853 Personal history of malignant neoplasm of breast: Secondary | ICD-10-CM | POA: Diagnosis not present

## 2016-03-19 DIAGNOSIS — I951 Orthostatic hypotension: Secondary | ICD-10-CM | POA: Diagnosis not present

## 2016-03-19 DIAGNOSIS — I69398 Other sequelae of cerebral infarction: Secondary | ICD-10-CM | POA: Diagnosis not present

## 2016-03-20 DIAGNOSIS — M1990 Unspecified osteoarthritis, unspecified site: Secondary | ICD-10-CM | POA: Diagnosis not present

## 2016-03-20 DIAGNOSIS — I69391 Dysphagia following cerebral infarction: Secondary | ICD-10-CM | POA: Diagnosis not present

## 2016-03-20 DIAGNOSIS — R531 Weakness: Secondary | ICD-10-CM | POA: Diagnosis not present

## 2016-03-20 DIAGNOSIS — K5090 Crohn's disease, unspecified, without complications: Secondary | ICD-10-CM | POA: Diagnosis not present

## 2016-03-20 DIAGNOSIS — I69398 Other sequelae of cerebral infarction: Secondary | ICD-10-CM | POA: Diagnosis not present

## 2016-03-20 DIAGNOSIS — I951 Orthostatic hypotension: Secondary | ICD-10-CM | POA: Diagnosis not present

## 2016-03-20 DIAGNOSIS — Z853 Personal history of malignant neoplasm of breast: Secondary | ICD-10-CM | POA: Diagnosis not present

## 2016-03-20 DIAGNOSIS — R1310 Dysphagia, unspecified: Secondary | ICD-10-CM | POA: Diagnosis not present

## 2016-03-20 DIAGNOSIS — I69354 Hemiplegia and hemiparesis following cerebral infarction affecting left non-dominant side: Secondary | ICD-10-CM | POA: Diagnosis not present

## 2016-03-23 DIAGNOSIS — I69391 Dysphagia following cerebral infarction: Secondary | ICD-10-CM | POA: Diagnosis not present

## 2016-03-23 DIAGNOSIS — Z853 Personal history of malignant neoplasm of breast: Secondary | ICD-10-CM | POA: Diagnosis not present

## 2016-03-23 DIAGNOSIS — R531 Weakness: Secondary | ICD-10-CM | POA: Diagnosis not present

## 2016-03-23 DIAGNOSIS — M1990 Unspecified osteoarthritis, unspecified site: Secondary | ICD-10-CM | POA: Diagnosis not present

## 2016-03-23 DIAGNOSIS — K5090 Crohn's disease, unspecified, without complications: Secondary | ICD-10-CM | POA: Diagnosis not present

## 2016-03-23 DIAGNOSIS — I69354 Hemiplegia and hemiparesis following cerebral infarction affecting left non-dominant side: Secondary | ICD-10-CM | POA: Diagnosis not present

## 2016-03-23 DIAGNOSIS — I69398 Other sequelae of cerebral infarction: Secondary | ICD-10-CM | POA: Diagnosis not present

## 2016-03-23 DIAGNOSIS — R1310 Dysphagia, unspecified: Secondary | ICD-10-CM | POA: Diagnosis not present

## 2016-03-23 DIAGNOSIS — I951 Orthostatic hypotension: Secondary | ICD-10-CM | POA: Diagnosis not present

## 2016-03-24 DIAGNOSIS — I951 Orthostatic hypotension: Secondary | ICD-10-CM | POA: Diagnosis not present

## 2016-03-24 DIAGNOSIS — I69354 Hemiplegia and hemiparesis following cerebral infarction affecting left non-dominant side: Secondary | ICD-10-CM | POA: Diagnosis not present

## 2016-03-24 DIAGNOSIS — I69398 Other sequelae of cerebral infarction: Secondary | ICD-10-CM | POA: Diagnosis not present

## 2016-03-24 DIAGNOSIS — Z853 Personal history of malignant neoplasm of breast: Secondary | ICD-10-CM | POA: Diagnosis not present

## 2016-03-24 DIAGNOSIS — K5090 Crohn's disease, unspecified, without complications: Secondary | ICD-10-CM | POA: Diagnosis not present

## 2016-03-24 DIAGNOSIS — R1310 Dysphagia, unspecified: Secondary | ICD-10-CM | POA: Diagnosis not present

## 2016-03-24 DIAGNOSIS — R531 Weakness: Secondary | ICD-10-CM | POA: Diagnosis not present

## 2016-03-24 DIAGNOSIS — I69391 Dysphagia following cerebral infarction: Secondary | ICD-10-CM | POA: Diagnosis not present

## 2016-03-24 DIAGNOSIS — M1990 Unspecified osteoarthritis, unspecified site: Secondary | ICD-10-CM | POA: Diagnosis not present

## 2016-03-25 DIAGNOSIS — I69391 Dysphagia following cerebral infarction: Secondary | ICD-10-CM | POA: Diagnosis not present

## 2016-03-25 DIAGNOSIS — K5090 Crohn's disease, unspecified, without complications: Secondary | ICD-10-CM | POA: Diagnosis not present

## 2016-03-25 DIAGNOSIS — R1310 Dysphagia, unspecified: Secondary | ICD-10-CM | POA: Diagnosis not present

## 2016-03-25 DIAGNOSIS — M1990 Unspecified osteoarthritis, unspecified site: Secondary | ICD-10-CM | POA: Diagnosis not present

## 2016-03-25 DIAGNOSIS — I69398 Other sequelae of cerebral infarction: Secondary | ICD-10-CM | POA: Diagnosis not present

## 2016-03-25 DIAGNOSIS — R531 Weakness: Secondary | ICD-10-CM | POA: Diagnosis not present

## 2016-03-25 DIAGNOSIS — Z853 Personal history of malignant neoplasm of breast: Secondary | ICD-10-CM | POA: Diagnosis not present

## 2016-03-25 DIAGNOSIS — I951 Orthostatic hypotension: Secondary | ICD-10-CM | POA: Diagnosis not present

## 2016-03-25 DIAGNOSIS — I69354 Hemiplegia and hemiparesis following cerebral infarction affecting left non-dominant side: Secondary | ICD-10-CM | POA: Diagnosis not present

## 2016-03-26 DIAGNOSIS — R1310 Dysphagia, unspecified: Secondary | ICD-10-CM | POA: Diagnosis not present

## 2016-03-26 DIAGNOSIS — M1990 Unspecified osteoarthritis, unspecified site: Secondary | ICD-10-CM | POA: Diagnosis not present

## 2016-03-26 DIAGNOSIS — Z853 Personal history of malignant neoplasm of breast: Secondary | ICD-10-CM | POA: Diagnosis not present

## 2016-03-26 DIAGNOSIS — K5090 Crohn's disease, unspecified, without complications: Secondary | ICD-10-CM | POA: Diagnosis not present

## 2016-03-26 DIAGNOSIS — I69354 Hemiplegia and hemiparesis following cerebral infarction affecting left non-dominant side: Secondary | ICD-10-CM | POA: Diagnosis not present

## 2016-03-26 DIAGNOSIS — I951 Orthostatic hypotension: Secondary | ICD-10-CM | POA: Diagnosis not present

## 2016-03-26 DIAGNOSIS — R531 Weakness: Secondary | ICD-10-CM | POA: Diagnosis not present

## 2016-03-26 DIAGNOSIS — I69391 Dysphagia following cerebral infarction: Secondary | ICD-10-CM | POA: Diagnosis not present

## 2016-03-26 DIAGNOSIS — I69398 Other sequelae of cerebral infarction: Secondary | ICD-10-CM | POA: Diagnosis not present

## 2016-03-30 DIAGNOSIS — I69354 Hemiplegia and hemiparesis following cerebral infarction affecting left non-dominant side: Secondary | ICD-10-CM | POA: Diagnosis not present

## 2016-03-30 DIAGNOSIS — K5090 Crohn's disease, unspecified, without complications: Secondary | ICD-10-CM | POA: Diagnosis not present

## 2016-03-30 DIAGNOSIS — R531 Weakness: Secondary | ICD-10-CM | POA: Diagnosis not present

## 2016-03-30 DIAGNOSIS — I69398 Other sequelae of cerebral infarction: Secondary | ICD-10-CM | POA: Diagnosis not present

## 2016-03-30 DIAGNOSIS — I69391 Dysphagia following cerebral infarction: Secondary | ICD-10-CM | POA: Diagnosis not present

## 2016-03-30 DIAGNOSIS — I951 Orthostatic hypotension: Secondary | ICD-10-CM | POA: Diagnosis not present

## 2016-03-30 DIAGNOSIS — M1990 Unspecified osteoarthritis, unspecified site: Secondary | ICD-10-CM | POA: Diagnosis not present

## 2016-03-30 DIAGNOSIS — R1310 Dysphagia, unspecified: Secondary | ICD-10-CM | POA: Diagnosis not present

## 2016-03-30 DIAGNOSIS — Z853 Personal history of malignant neoplasm of breast: Secondary | ICD-10-CM | POA: Diagnosis not present

## 2016-03-31 DIAGNOSIS — I69354 Hemiplegia and hemiparesis following cerebral infarction affecting left non-dominant side: Secondary | ICD-10-CM | POA: Diagnosis not present

## 2016-03-31 DIAGNOSIS — Z853 Personal history of malignant neoplasm of breast: Secondary | ICD-10-CM | POA: Diagnosis not present

## 2016-03-31 DIAGNOSIS — I69391 Dysphagia following cerebral infarction: Secondary | ICD-10-CM | POA: Diagnosis not present

## 2016-03-31 DIAGNOSIS — I951 Orthostatic hypotension: Secondary | ICD-10-CM | POA: Diagnosis not present

## 2016-03-31 DIAGNOSIS — R531 Weakness: Secondary | ICD-10-CM | POA: Diagnosis not present

## 2016-03-31 DIAGNOSIS — K5090 Crohn's disease, unspecified, without complications: Secondary | ICD-10-CM | POA: Diagnosis not present

## 2016-03-31 DIAGNOSIS — I69398 Other sequelae of cerebral infarction: Secondary | ICD-10-CM | POA: Diagnosis not present

## 2016-03-31 DIAGNOSIS — R1310 Dysphagia, unspecified: Secondary | ICD-10-CM | POA: Diagnosis not present

## 2016-03-31 DIAGNOSIS — M1990 Unspecified osteoarthritis, unspecified site: Secondary | ICD-10-CM | POA: Diagnosis not present

## 2016-04-01 DIAGNOSIS — M1990 Unspecified osteoarthritis, unspecified site: Secondary | ICD-10-CM | POA: Diagnosis not present

## 2016-04-01 DIAGNOSIS — K5090 Crohn's disease, unspecified, without complications: Secondary | ICD-10-CM | POA: Diagnosis not present

## 2016-04-01 DIAGNOSIS — R1310 Dysphagia, unspecified: Secondary | ICD-10-CM | POA: Diagnosis not present

## 2016-04-01 DIAGNOSIS — I69398 Other sequelae of cerebral infarction: Secondary | ICD-10-CM | POA: Diagnosis not present

## 2016-04-01 DIAGNOSIS — I951 Orthostatic hypotension: Secondary | ICD-10-CM | POA: Diagnosis not present

## 2016-04-01 DIAGNOSIS — Z853 Personal history of malignant neoplasm of breast: Secondary | ICD-10-CM | POA: Diagnosis not present

## 2016-04-01 DIAGNOSIS — I69354 Hemiplegia and hemiparesis following cerebral infarction affecting left non-dominant side: Secondary | ICD-10-CM | POA: Diagnosis not present

## 2016-04-01 DIAGNOSIS — R531 Weakness: Secondary | ICD-10-CM | POA: Diagnosis not present

## 2016-04-01 DIAGNOSIS — I69391 Dysphagia following cerebral infarction: Secondary | ICD-10-CM | POA: Diagnosis not present

## 2016-04-02 DIAGNOSIS — R531 Weakness: Secondary | ICD-10-CM | POA: Diagnosis not present

## 2016-04-02 DIAGNOSIS — R1310 Dysphagia, unspecified: Secondary | ICD-10-CM | POA: Diagnosis not present

## 2016-04-02 DIAGNOSIS — I69354 Hemiplegia and hemiparesis following cerebral infarction affecting left non-dominant side: Secondary | ICD-10-CM | POA: Diagnosis not present

## 2016-04-02 DIAGNOSIS — K5090 Crohn's disease, unspecified, without complications: Secondary | ICD-10-CM | POA: Diagnosis not present

## 2016-04-02 DIAGNOSIS — I69398 Other sequelae of cerebral infarction: Secondary | ICD-10-CM | POA: Diagnosis not present

## 2016-04-02 DIAGNOSIS — I951 Orthostatic hypotension: Secondary | ICD-10-CM | POA: Diagnosis not present

## 2016-04-02 DIAGNOSIS — I69391 Dysphagia following cerebral infarction: Secondary | ICD-10-CM | POA: Diagnosis not present

## 2016-04-02 DIAGNOSIS — Z853 Personal history of malignant neoplasm of breast: Secondary | ICD-10-CM | POA: Diagnosis not present

## 2016-04-02 DIAGNOSIS — M1990 Unspecified osteoarthritis, unspecified site: Secondary | ICD-10-CM | POA: Diagnosis not present

## 2016-04-03 DIAGNOSIS — K5090 Crohn's disease, unspecified, without complications: Secondary | ICD-10-CM | POA: Diagnosis not present

## 2016-04-03 DIAGNOSIS — I69398 Other sequelae of cerebral infarction: Secondary | ICD-10-CM | POA: Diagnosis not present

## 2016-04-03 DIAGNOSIS — M1990 Unspecified osteoarthritis, unspecified site: Secondary | ICD-10-CM | POA: Diagnosis not present

## 2016-04-03 DIAGNOSIS — R1310 Dysphagia, unspecified: Secondary | ICD-10-CM | POA: Diagnosis not present

## 2016-04-03 DIAGNOSIS — R531 Weakness: Secondary | ICD-10-CM | POA: Diagnosis not present

## 2016-04-03 DIAGNOSIS — I951 Orthostatic hypotension: Secondary | ICD-10-CM | POA: Diagnosis not present

## 2016-04-03 DIAGNOSIS — I69354 Hemiplegia and hemiparesis following cerebral infarction affecting left non-dominant side: Secondary | ICD-10-CM | POA: Diagnosis not present

## 2016-04-03 DIAGNOSIS — I69391 Dysphagia following cerebral infarction: Secondary | ICD-10-CM | POA: Diagnosis not present

## 2016-04-03 DIAGNOSIS — Z853 Personal history of malignant neoplasm of breast: Secondary | ICD-10-CM | POA: Diagnosis not present

## 2016-04-06 DIAGNOSIS — I69391 Dysphagia following cerebral infarction: Secondary | ICD-10-CM | POA: Diagnosis not present

## 2016-04-06 DIAGNOSIS — I69354 Hemiplegia and hemiparesis following cerebral infarction affecting left non-dominant side: Secondary | ICD-10-CM | POA: Diagnosis not present

## 2016-04-06 DIAGNOSIS — R531 Weakness: Secondary | ICD-10-CM | POA: Diagnosis not present

## 2016-04-06 DIAGNOSIS — I951 Orthostatic hypotension: Secondary | ICD-10-CM | POA: Diagnosis not present

## 2016-04-06 DIAGNOSIS — R1310 Dysphagia, unspecified: Secondary | ICD-10-CM | POA: Diagnosis not present

## 2016-04-06 DIAGNOSIS — M1990 Unspecified osteoarthritis, unspecified site: Secondary | ICD-10-CM | POA: Diagnosis not present

## 2016-04-06 DIAGNOSIS — K5090 Crohn's disease, unspecified, without complications: Secondary | ICD-10-CM | POA: Diagnosis not present

## 2016-04-06 DIAGNOSIS — I69398 Other sequelae of cerebral infarction: Secondary | ICD-10-CM | POA: Diagnosis not present

## 2016-04-06 DIAGNOSIS — Z853 Personal history of malignant neoplasm of breast: Secondary | ICD-10-CM | POA: Diagnosis not present

## 2016-04-08 DIAGNOSIS — I951 Orthostatic hypotension: Secondary | ICD-10-CM | POA: Diagnosis not present

## 2016-04-08 DIAGNOSIS — R531 Weakness: Secondary | ICD-10-CM | POA: Diagnosis not present

## 2016-04-08 DIAGNOSIS — K5090 Crohn's disease, unspecified, without complications: Secondary | ICD-10-CM | POA: Diagnosis not present

## 2016-04-08 DIAGNOSIS — M1990 Unspecified osteoarthritis, unspecified site: Secondary | ICD-10-CM | POA: Diagnosis not present

## 2016-04-08 DIAGNOSIS — I69398 Other sequelae of cerebral infarction: Secondary | ICD-10-CM | POA: Diagnosis not present

## 2016-04-08 DIAGNOSIS — Z853 Personal history of malignant neoplasm of breast: Secondary | ICD-10-CM | POA: Diagnosis not present

## 2016-04-08 DIAGNOSIS — I69391 Dysphagia following cerebral infarction: Secondary | ICD-10-CM | POA: Diagnosis not present

## 2016-04-08 DIAGNOSIS — R1310 Dysphagia, unspecified: Secondary | ICD-10-CM | POA: Diagnosis not present

## 2016-04-08 DIAGNOSIS — I69354 Hemiplegia and hemiparesis following cerebral infarction affecting left non-dominant side: Secondary | ICD-10-CM | POA: Diagnosis not present

## 2016-04-10 DIAGNOSIS — H353221 Exudative age-related macular degeneration, left eye, with active choroidal neovascularization: Secondary | ICD-10-CM | POA: Diagnosis not present

## 2016-04-13 DIAGNOSIS — I69391 Dysphagia following cerebral infarction: Secondary | ICD-10-CM | POA: Diagnosis not present

## 2016-04-13 DIAGNOSIS — M1990 Unspecified osteoarthritis, unspecified site: Secondary | ICD-10-CM | POA: Diagnosis not present

## 2016-04-13 DIAGNOSIS — I951 Orthostatic hypotension: Secondary | ICD-10-CM | POA: Diagnosis not present

## 2016-04-13 DIAGNOSIS — Z853 Personal history of malignant neoplasm of breast: Secondary | ICD-10-CM | POA: Diagnosis not present

## 2016-04-13 DIAGNOSIS — K5090 Crohn's disease, unspecified, without complications: Secondary | ICD-10-CM | POA: Diagnosis not present

## 2016-04-13 DIAGNOSIS — R1310 Dysphagia, unspecified: Secondary | ICD-10-CM | POA: Diagnosis not present

## 2016-04-13 DIAGNOSIS — I69354 Hemiplegia and hemiparesis following cerebral infarction affecting left non-dominant side: Secondary | ICD-10-CM | POA: Diagnosis not present

## 2016-04-13 DIAGNOSIS — R531 Weakness: Secondary | ICD-10-CM | POA: Diagnosis not present

## 2016-04-13 DIAGNOSIS — I69398 Other sequelae of cerebral infarction: Secondary | ICD-10-CM | POA: Diagnosis not present

## 2016-04-15 DIAGNOSIS — I69398 Other sequelae of cerebral infarction: Secondary | ICD-10-CM | POA: Diagnosis not present

## 2016-04-15 DIAGNOSIS — I69354 Hemiplegia and hemiparesis following cerebral infarction affecting left non-dominant side: Secondary | ICD-10-CM | POA: Diagnosis not present

## 2016-04-15 DIAGNOSIS — K5090 Crohn's disease, unspecified, without complications: Secondary | ICD-10-CM | POA: Diagnosis not present

## 2016-04-15 DIAGNOSIS — R1310 Dysphagia, unspecified: Secondary | ICD-10-CM | POA: Diagnosis not present

## 2016-04-15 DIAGNOSIS — I69391 Dysphagia following cerebral infarction: Secondary | ICD-10-CM | POA: Diagnosis not present

## 2016-04-15 DIAGNOSIS — Z853 Personal history of malignant neoplasm of breast: Secondary | ICD-10-CM | POA: Diagnosis not present

## 2016-04-15 DIAGNOSIS — R531 Weakness: Secondary | ICD-10-CM | POA: Diagnosis not present

## 2016-04-15 DIAGNOSIS — M1990 Unspecified osteoarthritis, unspecified site: Secondary | ICD-10-CM | POA: Diagnosis not present

## 2016-04-15 DIAGNOSIS — I951 Orthostatic hypotension: Secondary | ICD-10-CM | POA: Diagnosis not present

## 2016-04-20 DIAGNOSIS — M1990 Unspecified osteoarthritis, unspecified site: Secondary | ICD-10-CM | POA: Diagnosis not present

## 2016-04-20 DIAGNOSIS — I69391 Dysphagia following cerebral infarction: Secondary | ICD-10-CM | POA: Diagnosis not present

## 2016-04-20 DIAGNOSIS — I69398 Other sequelae of cerebral infarction: Secondary | ICD-10-CM | POA: Diagnosis not present

## 2016-04-20 DIAGNOSIS — I951 Orthostatic hypotension: Secondary | ICD-10-CM | POA: Diagnosis not present

## 2016-04-20 DIAGNOSIS — R531 Weakness: Secondary | ICD-10-CM | POA: Diagnosis not present

## 2016-04-20 DIAGNOSIS — K5090 Crohn's disease, unspecified, without complications: Secondary | ICD-10-CM | POA: Diagnosis not present

## 2016-04-20 DIAGNOSIS — R1310 Dysphagia, unspecified: Secondary | ICD-10-CM | POA: Diagnosis not present

## 2016-04-20 DIAGNOSIS — I69354 Hemiplegia and hemiparesis following cerebral infarction affecting left non-dominant side: Secondary | ICD-10-CM | POA: Diagnosis not present

## 2016-04-20 DIAGNOSIS — Z853 Personal history of malignant neoplasm of breast: Secondary | ICD-10-CM | POA: Diagnosis not present

## 2016-05-01 DIAGNOSIS — G4489 Other headache syndrome: Secondary | ICD-10-CM | POA: Diagnosis not present

## 2016-05-22 DIAGNOSIS — H353221 Exudative age-related macular degeneration, left eye, with active choroidal neovascularization: Secondary | ICD-10-CM | POA: Diagnosis not present

## 2016-06-02 DIAGNOSIS — J449 Chronic obstructive pulmonary disease, unspecified: Secondary | ICD-10-CM | POA: Diagnosis not present

## 2016-06-02 DIAGNOSIS — F329 Major depressive disorder, single episode, unspecified: Secondary | ICD-10-CM | POA: Diagnosis not present

## 2016-06-02 DIAGNOSIS — I951 Orthostatic hypotension: Secondary | ICD-10-CM | POA: Diagnosis not present

## 2016-06-02 DIAGNOSIS — K210 Gastro-esophageal reflux disease with esophagitis: Secondary | ICD-10-CM | POA: Diagnosis not present

## 2016-06-23 IMAGING — CR DG LUMBAR SPINE COMPLETE 4+V
5 series · 5 of 5 positions shown · non-contrast
Comparison: None.

CLINICAL DATA: Fall, back injury 8 days ago

EXAM:
LUMBAR SPINE - COMPLETE 4+ VIEW

[l-spine ap]
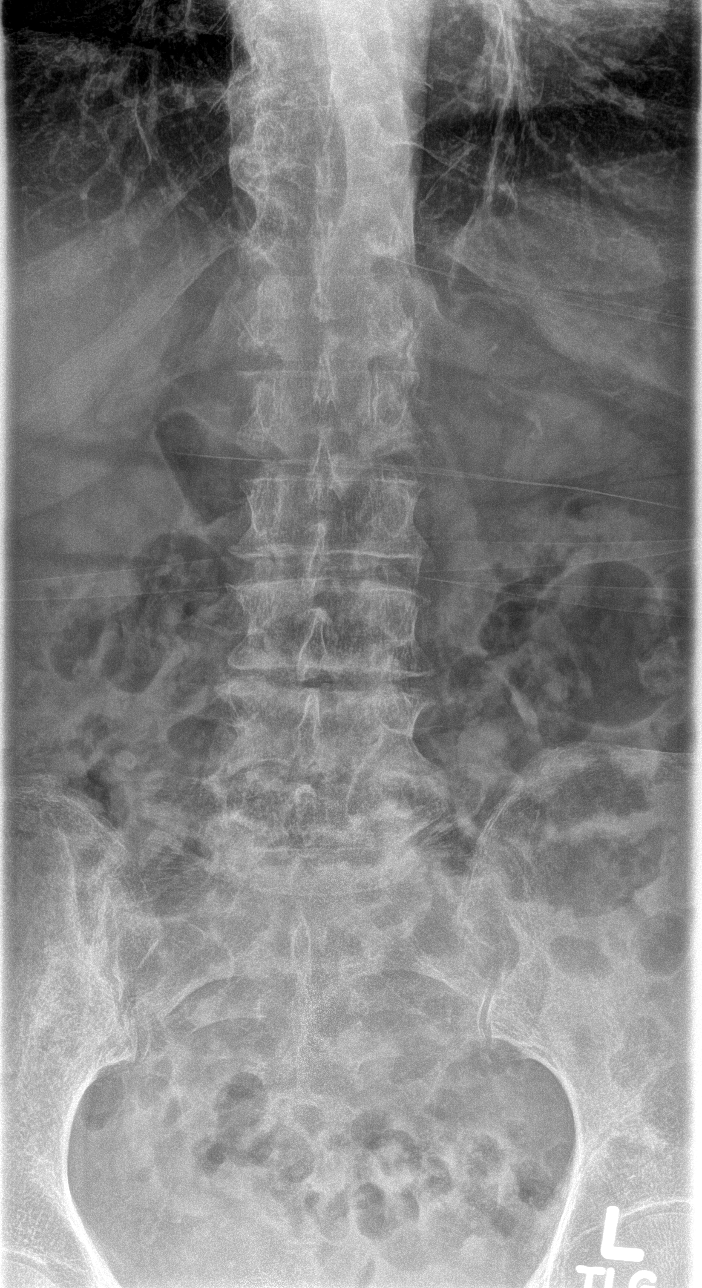

[l-spine obl (1 of 2)]
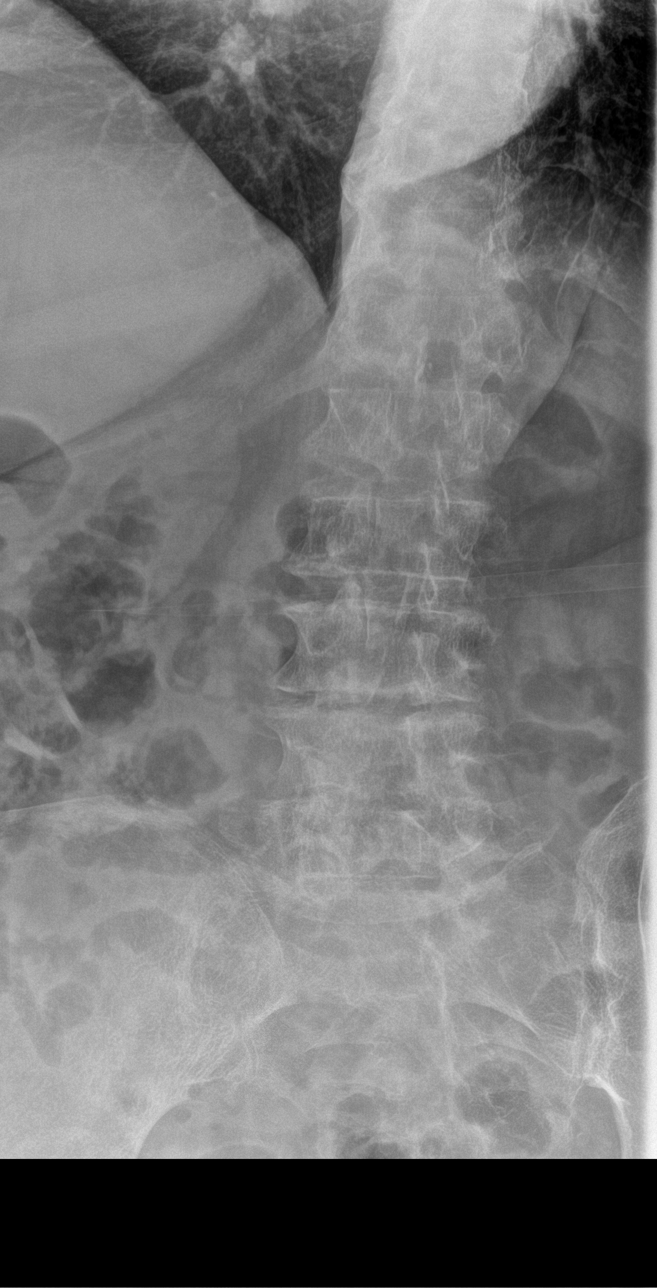

[l-spine obl (2 of 2)]
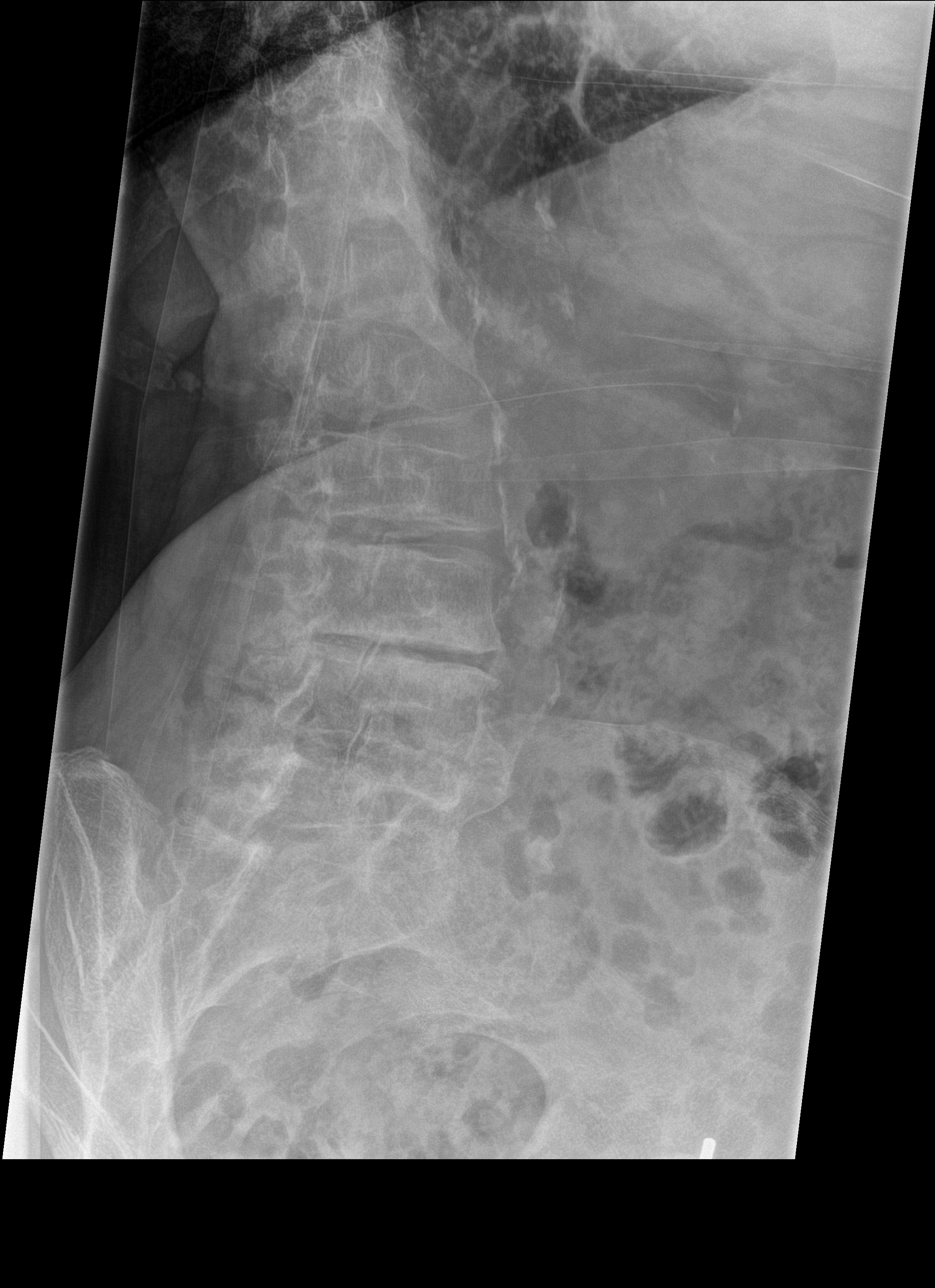

[l-spine lat]
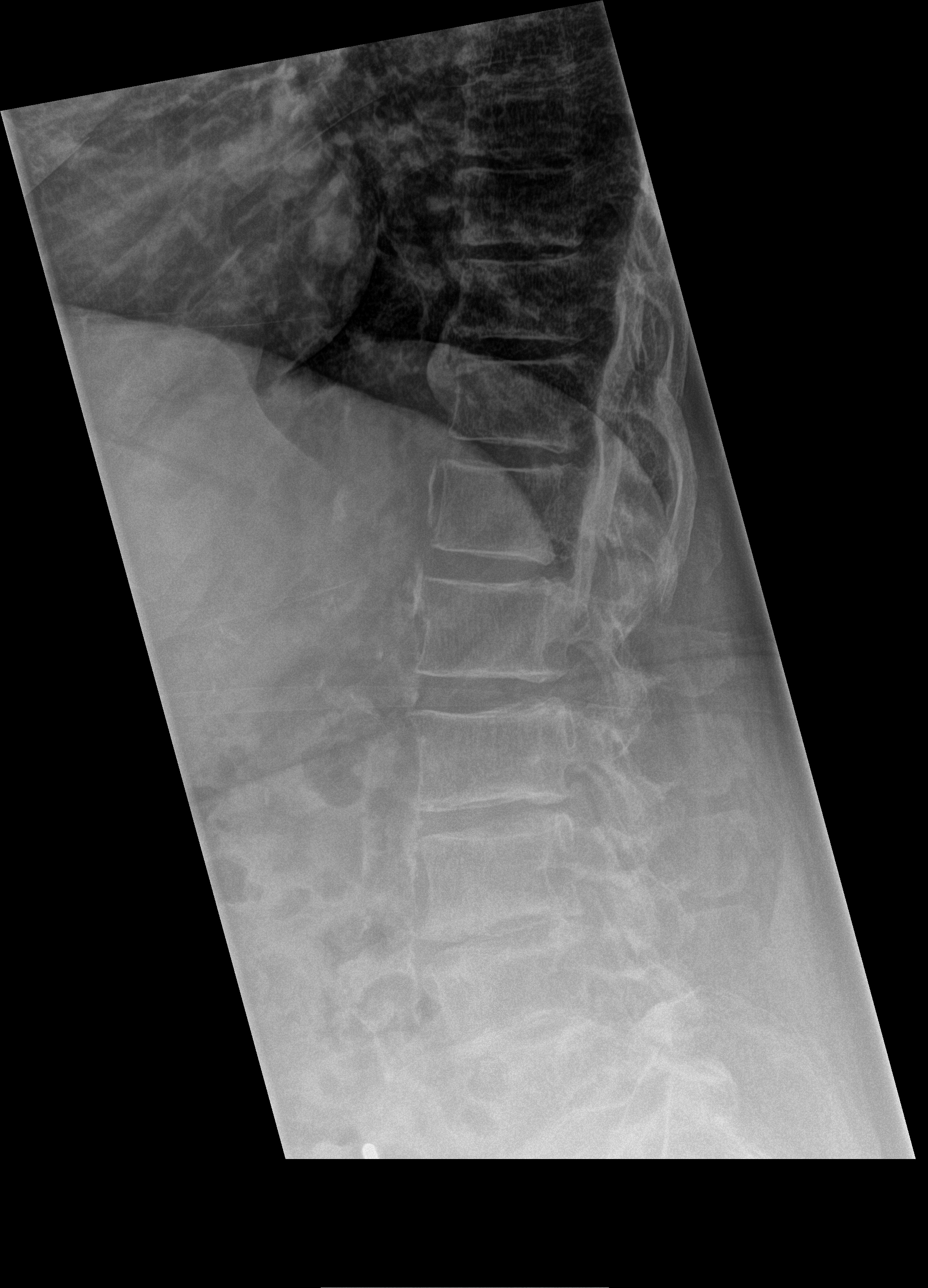

[l-spine spot]
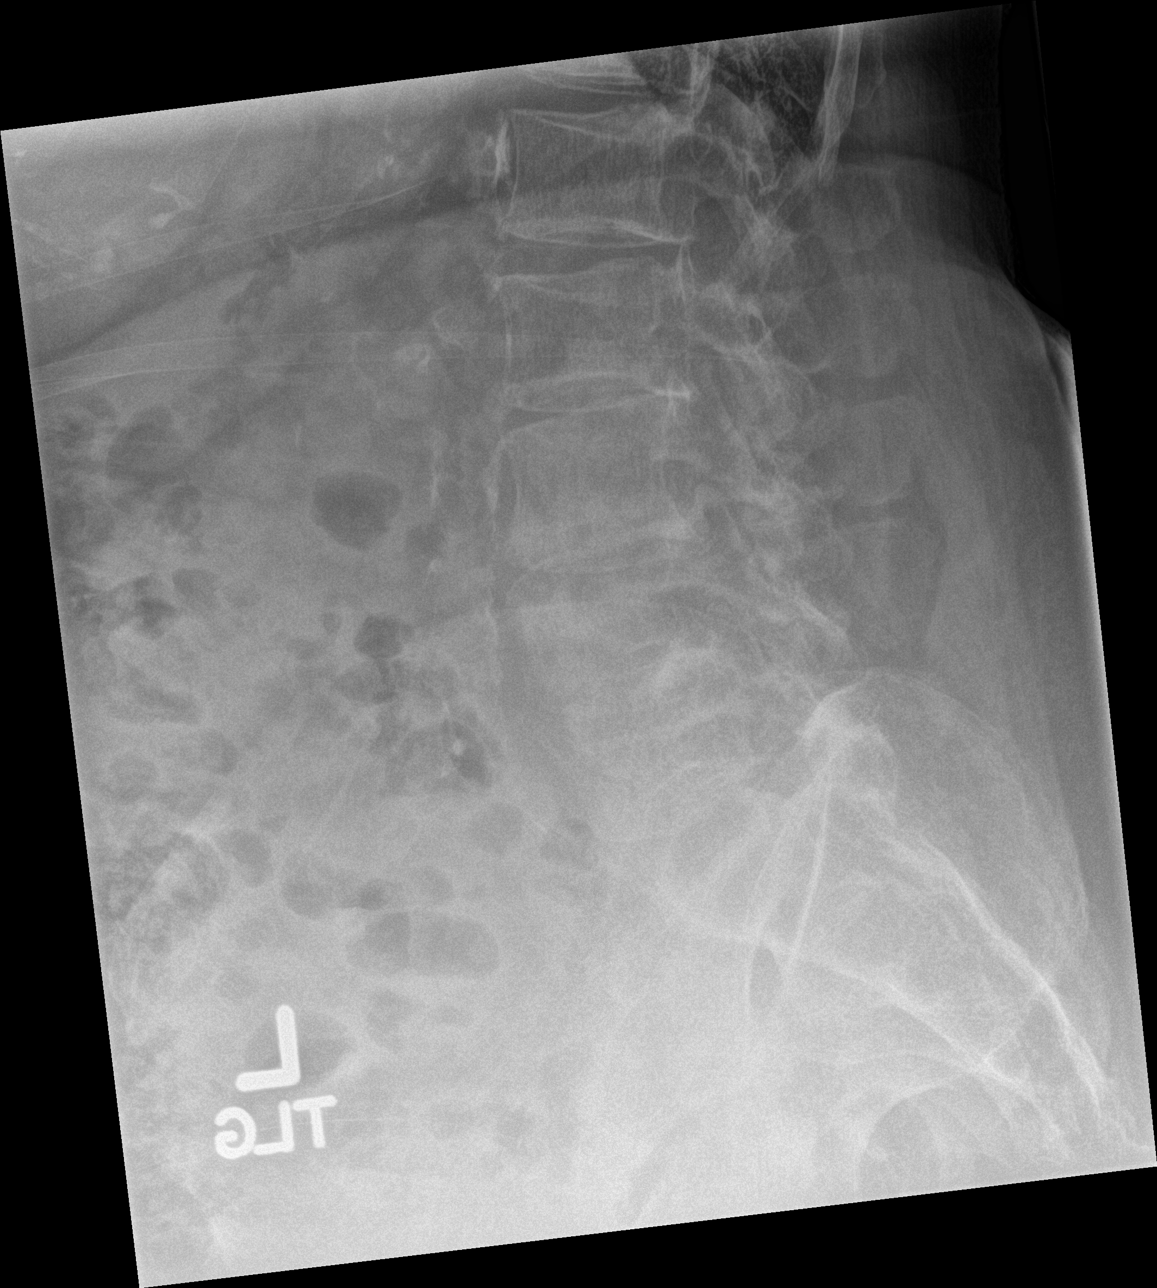

[5 of 5 positions shown; findings below may reference images not displayed]

FINDINGS: Five views of lumbar spine submitted. There is diffuse osteopenia.
Disc space flattening with minimal anterior spurring at L3-L4 level.
Disc space flattening at L4-L5 level. Minimal disc space flattening
at L5-S1 level. Atherosclerotic calcifications of abdominal aorta.
IMPRESSION: Diffuse osteopenia. No acute fracture or subluxation. Degenerative
changes as described above.

## 2016-06-23 IMAGING — CT CT HEAD W/O CM
2 of 5 series · 12 of 47 positions shown, 15 images · non-contrast
Comparison: Brain MRI July 11, 2014

CLINICAL DATA: Pain following falls.  Unsteady gait

EXAM:
CT HEAD WITHOUT CONTRAST
CT CERVICAL SPINE WITHOUT CONTRAST
TECHNIQUE: Multidetector CT imaging of the head and cervical spine was
performed following the standard protocol without intravenous
contrast. Multiplanar CT image reconstructions of the cervical spine
were also generated.

[Series 8: coronals · coronal · 0.27mm/px · 3 of 55 slices shown]
[im 19/55  brain]
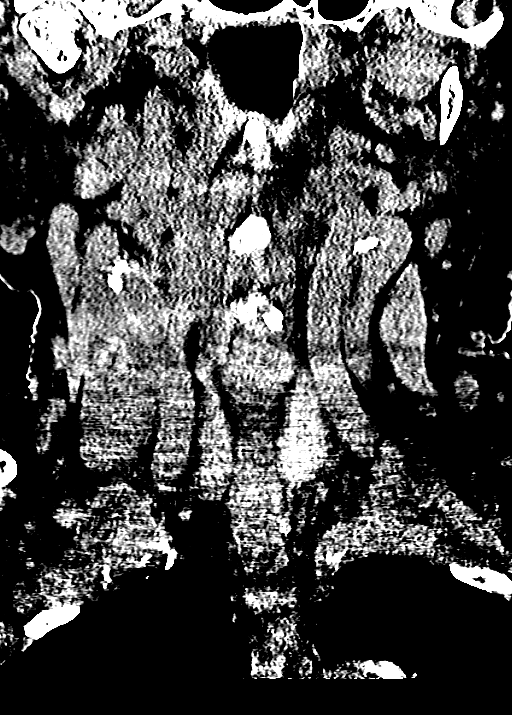
[im 25/55  brain]
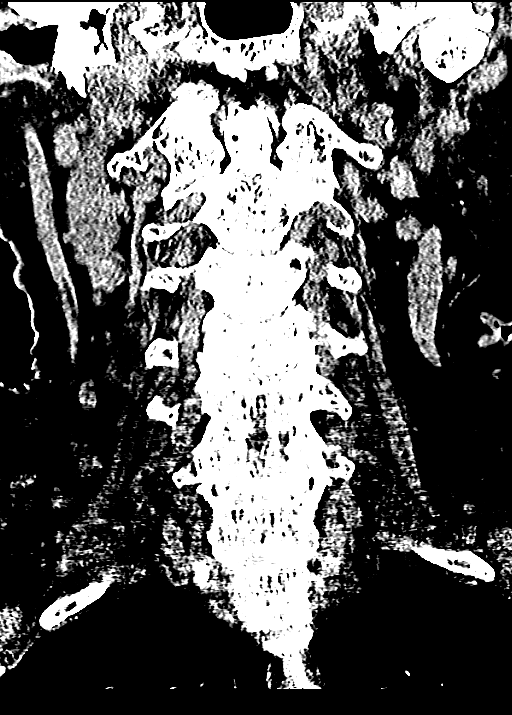
[im 31/55  brain]
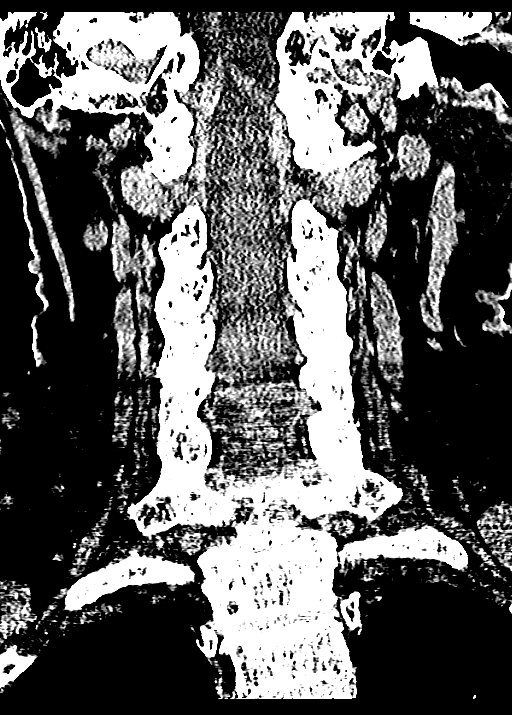

[Series 10: orthogonals · axial · 0.20mm/px · z∈[-264,-117]mm · 9 of 92 slices shown, 12 images]
[im 8/92  brain]
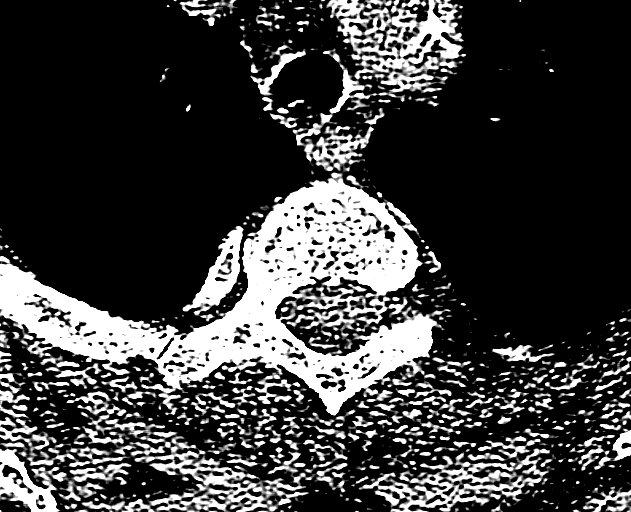
[im 8/92  bone]
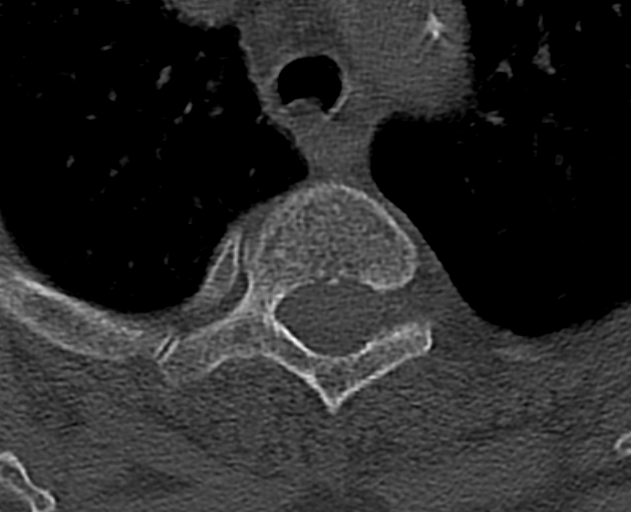
[im 16/92  brain]
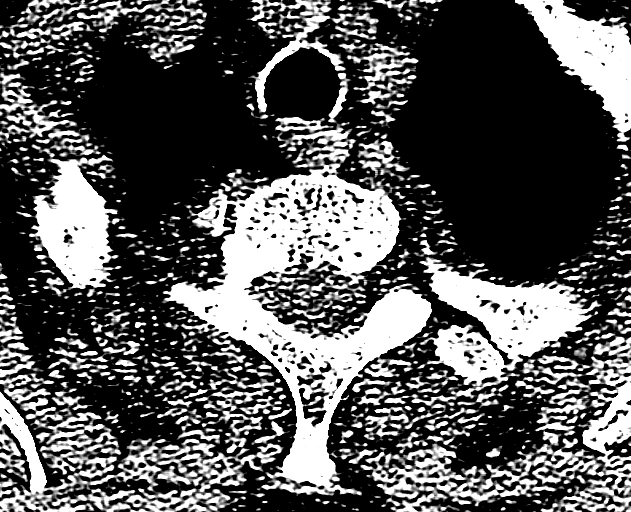
[im 31/92  brain]
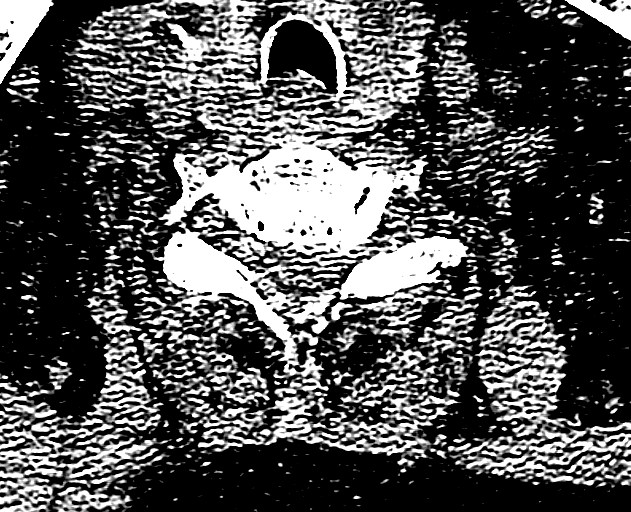
[im 38/92  brain]
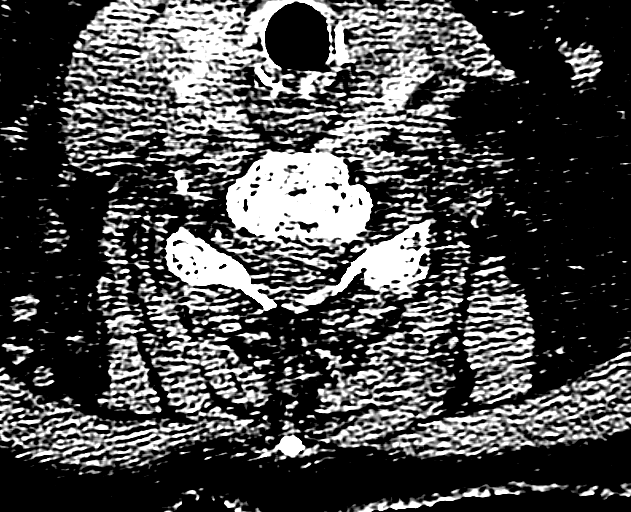
[im 46/92  brain]
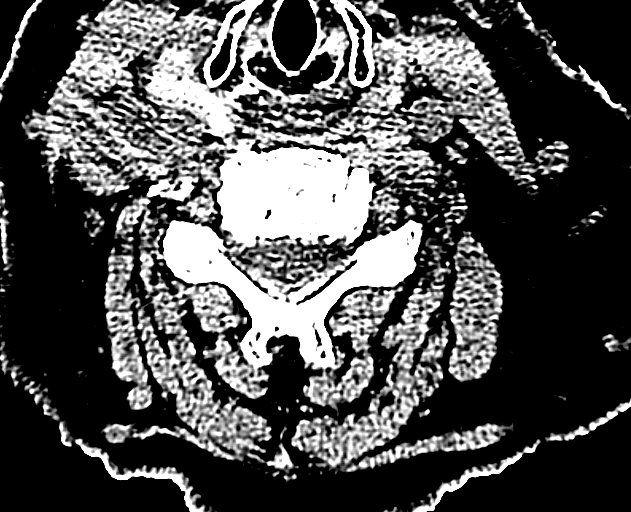
[im 46/92  bone]
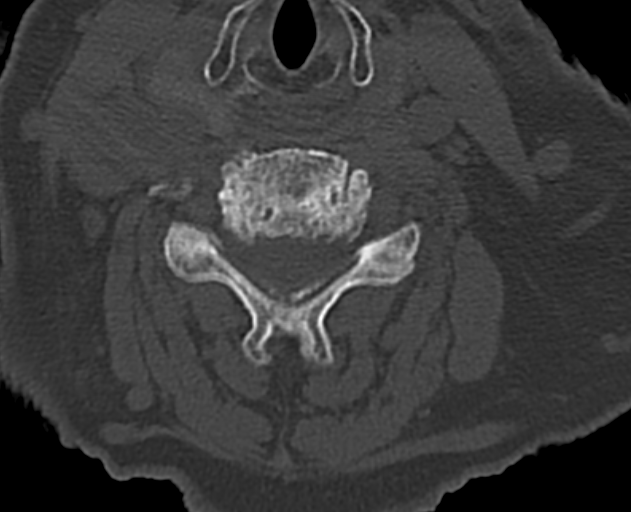
[im 54/92  brain]
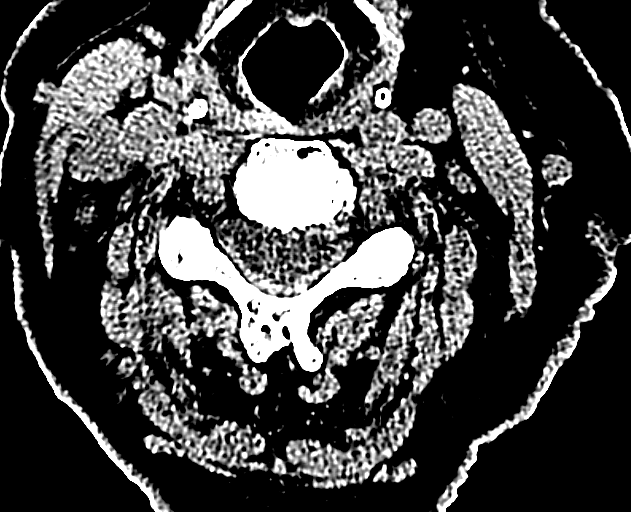
[im 61/92  brain]
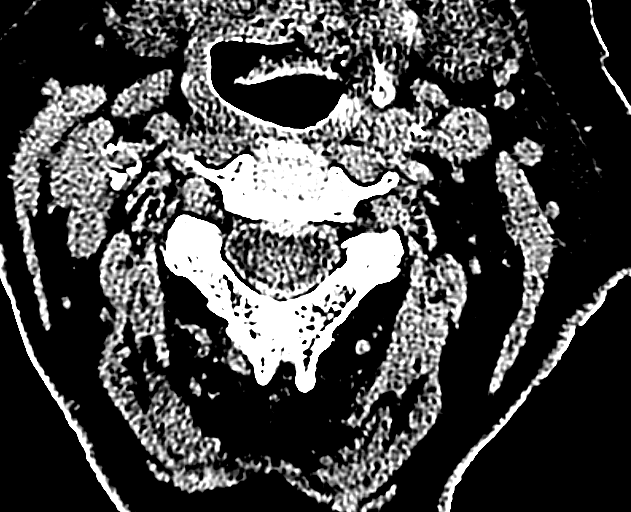
[im 76/92  brain]
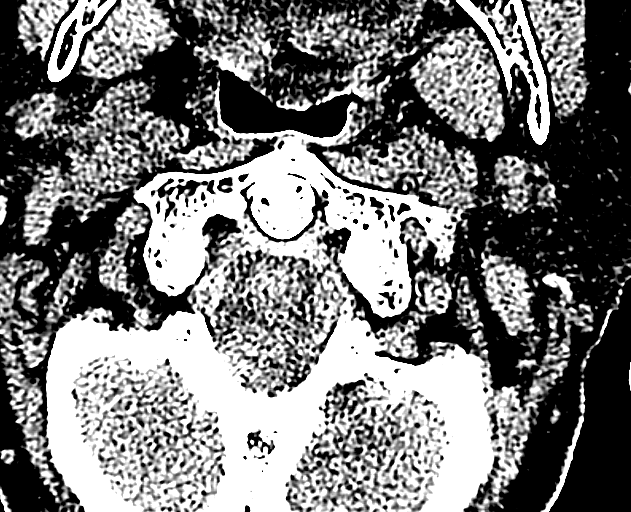
[im 84/92  brain]
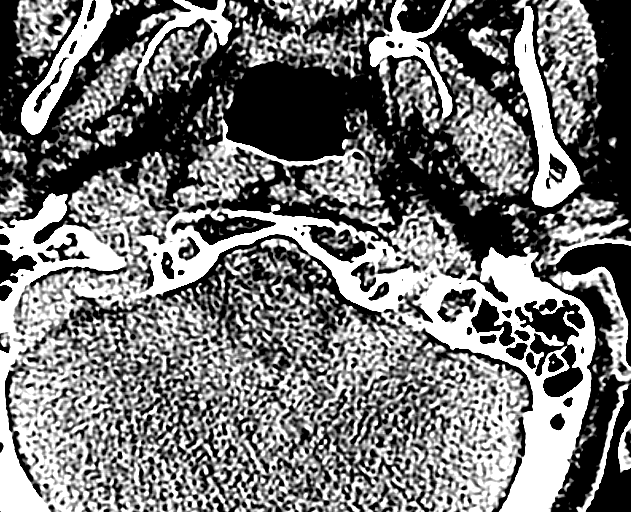
[im 84/92  bone]
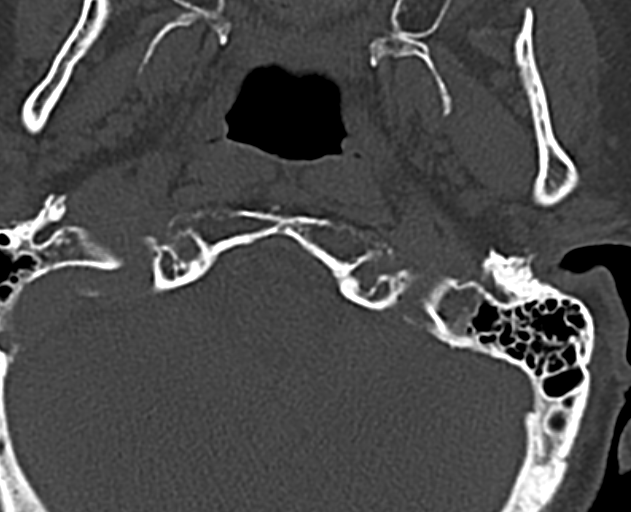

[12 of 47 positions shown; findings below may reference images not displayed]

FINDINGS: CT HEAD FINDINGS

Moderate diffuse atrophy is stable. There is no intracranial mass,
hemorrhage, extra-axial fluid collection, or midline shift. There is
small vessel disease in the centra semiovale bilaterally. There is
evidence of a prior small infarct in the inferior right centrum
semiovale, stable. There is scattered of basal ganglia
calcification. There is no new gray-white compartment lesion. No
acute infarct apparent. Bony calvarium appears intact. The mastoid
air cells are clear. There is frontal hyperostosis bilaterally.

CT CERVICAL SPINE FINDINGS

There is no fracture or spondylolisthesis. Prevertebral soft tissues
and predental space regions are normal. There is ankylosis at C5-6.
There is moderately severe disc space narrowing at C4-5 and C6-7.
There is milder narrowing at C7-T1. There is multilevel facet
hypertrophy. There is exit foraminal narrowing due to bony
hypertrophy at C5-6 bilaterally. No disc extrusion or stenosis.

There are foci of carotid artery calcification bilaterally. There
are several nodular opacities in the thyroid, largest measuring
x 1.1 cm in the left lobe.
IMPRESSION: CT head: Atrophy with periventricular small vessel disease. Prior
infarct inferior right centrum semiovale. No intracranial mass,
hemorrhage, or extra-axial fluid collection. No acute appearing
infarct.

CT cervical spine: No fracture or spondylolisthesis. Multilevel
arthropathy. Bones osteoporotic. There are foci of calcification in
each carotid artery. Thyroid is inhomogeneous with nodular lesions
within the thyroid. Recommend further evaluation with thyroid
ultrasound. If patient is clinically hyperthyroid, consider nuclear
medicine thyroid uptake and scan.

## 2016-07-01 DIAGNOSIS — H353221 Exudative age-related macular degeneration, left eye, with active choroidal neovascularization: Secondary | ICD-10-CM | POA: Diagnosis not present

## 2016-07-14 DIAGNOSIS — M79643 Pain in unspecified hand: Secondary | ICD-10-CM | POA: Diagnosis not present

## 2016-07-14 DIAGNOSIS — Z23 Encounter for immunization: Secondary | ICD-10-CM | POA: Diagnosis not present

## 2016-08-12 DIAGNOSIS — H353221 Exudative age-related macular degeneration, left eye, with active choroidal neovascularization: Secondary | ICD-10-CM | POA: Diagnosis not present

## 2016-09-07 DIAGNOSIS — M1990 Unspecified osteoarthritis, unspecified site: Secondary | ICD-10-CM | POA: Diagnosis not present

## 2016-09-07 DIAGNOSIS — I10 Essential (primary) hypertension: Secondary | ICD-10-CM | POA: Diagnosis not present

## 2016-09-07 DIAGNOSIS — I951 Orthostatic hypotension: Secondary | ICD-10-CM | POA: Diagnosis not present

## 2016-09-07 DIAGNOSIS — C8190 Hodgkin lymphoma, unspecified, unspecified site: Secondary | ICD-10-CM | POA: Diagnosis not present

## 2016-10-28 DIAGNOSIS — H353221 Exudative age-related macular degeneration, left eye, with active choroidal neovascularization: Secondary | ICD-10-CM | POA: Diagnosis not present

## 2016-11-18 DIAGNOSIS — J019 Acute sinusitis, unspecified: Secondary | ICD-10-CM | POA: Diagnosis not present

## 2016-11-18 DIAGNOSIS — R42 Dizziness and giddiness: Secondary | ICD-10-CM | POA: Diagnosis not present

## 2016-11-18 DIAGNOSIS — I10 Essential (primary) hypertension: Secondary | ICD-10-CM | POA: Diagnosis not present

## 2016-11-18 DIAGNOSIS — J449 Chronic obstructive pulmonary disease, unspecified: Secondary | ICD-10-CM | POA: Diagnosis not present

## 2016-11-23 ENCOUNTER — Ambulatory Visit (HOSPITAL_COMMUNITY)
Admission: RE | Admit: 2016-11-23 | Discharge: 2016-11-23 | Disposition: A | Discharge status: Home / Self Care | Payer: Commercial Managed Care - HMO | Source: Ambulatory Visit | Attending: Pulmonary Disease | Admitting: Pulmonary Disease

## 2016-11-23 DIAGNOSIS — R42 Dizziness and giddiness: Secondary | ICD-10-CM | POA: Diagnosis not present

## 2016-11-23 DIAGNOSIS — R000 Tachycardia, unspecified: Secondary | ICD-10-CM | POA: Diagnosis not present

## 2016-11-23 DIAGNOSIS — I493 Ventricular premature depolarization: Secondary | ICD-10-CM | POA: Diagnosis not present

## 2016-11-25 DIAGNOSIS — G8190 Hemiplegia, unspecified affecting unspecified side: Secondary | ICD-10-CM | POA: Diagnosis not present

## 2016-11-25 DIAGNOSIS — M1990 Unspecified osteoarthritis, unspecified site: Secondary | ICD-10-CM | POA: Diagnosis not present

## 2016-11-25 DIAGNOSIS — I10 Essential (primary) hypertension: Secondary | ICD-10-CM | POA: Diagnosis not present

## 2016-11-25 DIAGNOSIS — I951 Orthostatic hypotension: Secondary | ICD-10-CM | POA: Diagnosis not present

## 2016-11-30 DIAGNOSIS — I110 Hypertensive heart disease with heart failure: Secondary | ICD-10-CM | POA: Diagnosis not present

## 2016-11-30 DIAGNOSIS — Z9181 History of falling: Secondary | ICD-10-CM | POA: Diagnosis not present

## 2016-11-30 DIAGNOSIS — I951 Orthostatic hypotension: Secondary | ICD-10-CM | POA: Diagnosis not present

## 2016-11-30 DIAGNOSIS — Z8673 Personal history of transient ischemic attack (TIA), and cerebral infarction without residual deficits: Secondary | ICD-10-CM | POA: Diagnosis not present

## 2016-11-30 DIAGNOSIS — M1990 Unspecified osteoarthritis, unspecified site: Secondary | ICD-10-CM | POA: Diagnosis not present

## 2016-11-30 DIAGNOSIS — J449 Chronic obstructive pulmonary disease, unspecified: Secondary | ICD-10-CM | POA: Diagnosis not present

## 2016-11-30 DIAGNOSIS — F329 Major depressive disorder, single episode, unspecified: Secondary | ICD-10-CM | POA: Diagnosis not present

## 2016-11-30 DIAGNOSIS — I509 Heart failure, unspecified: Secondary | ICD-10-CM | POA: Diagnosis not present

## 2016-11-30 DIAGNOSIS — R42 Dizziness and giddiness: Secondary | ICD-10-CM | POA: Diagnosis not present

## 2016-12-02 DIAGNOSIS — I110 Hypertensive heart disease with heart failure: Secondary | ICD-10-CM | POA: Diagnosis not present

## 2016-12-02 DIAGNOSIS — F329 Major depressive disorder, single episode, unspecified: Secondary | ICD-10-CM | POA: Diagnosis not present

## 2016-12-02 DIAGNOSIS — Z8673 Personal history of transient ischemic attack (TIA), and cerebral infarction without residual deficits: Secondary | ICD-10-CM | POA: Diagnosis not present

## 2016-12-02 DIAGNOSIS — J449 Chronic obstructive pulmonary disease, unspecified: Secondary | ICD-10-CM | POA: Diagnosis not present

## 2016-12-02 DIAGNOSIS — I951 Orthostatic hypotension: Secondary | ICD-10-CM | POA: Diagnosis not present

## 2016-12-02 DIAGNOSIS — M1990 Unspecified osteoarthritis, unspecified site: Secondary | ICD-10-CM | POA: Diagnosis not present

## 2016-12-02 DIAGNOSIS — R42 Dizziness and giddiness: Secondary | ICD-10-CM | POA: Diagnosis not present

## 2016-12-02 DIAGNOSIS — Z9181 History of falling: Secondary | ICD-10-CM | POA: Diagnosis not present

## 2016-12-02 DIAGNOSIS — I509 Heart failure, unspecified: Secondary | ICD-10-CM | POA: Diagnosis not present

## 2016-12-07 DIAGNOSIS — M1990 Unspecified osteoarthritis, unspecified site: Secondary | ICD-10-CM | POA: Diagnosis not present

## 2016-12-07 DIAGNOSIS — J449 Chronic obstructive pulmonary disease, unspecified: Secondary | ICD-10-CM | POA: Diagnosis not present

## 2016-12-07 DIAGNOSIS — R42 Dizziness and giddiness: Secondary | ICD-10-CM | POA: Diagnosis not present

## 2016-12-07 DIAGNOSIS — I509 Heart failure, unspecified: Secondary | ICD-10-CM | POA: Diagnosis not present

## 2016-12-07 DIAGNOSIS — Z9181 History of falling: Secondary | ICD-10-CM | POA: Diagnosis not present

## 2016-12-07 DIAGNOSIS — Z8673 Personal history of transient ischemic attack (TIA), and cerebral infarction without residual deficits: Secondary | ICD-10-CM | POA: Diagnosis not present

## 2016-12-07 DIAGNOSIS — I951 Orthostatic hypotension: Secondary | ICD-10-CM | POA: Diagnosis not present

## 2016-12-07 DIAGNOSIS — I110 Hypertensive heart disease with heart failure: Secondary | ICD-10-CM | POA: Diagnosis not present

## 2016-12-07 DIAGNOSIS — F329 Major depressive disorder, single episode, unspecified: Secondary | ICD-10-CM | POA: Diagnosis not present

## 2016-12-09 DIAGNOSIS — I110 Hypertensive heart disease with heart failure: Secondary | ICD-10-CM | POA: Diagnosis not present

## 2016-12-09 DIAGNOSIS — I509 Heart failure, unspecified: Secondary | ICD-10-CM | POA: Diagnosis not present

## 2016-12-09 DIAGNOSIS — R42 Dizziness and giddiness: Secondary | ICD-10-CM | POA: Diagnosis not present

## 2016-12-09 DIAGNOSIS — Z8673 Personal history of transient ischemic attack (TIA), and cerebral infarction without residual deficits: Secondary | ICD-10-CM | POA: Diagnosis not present

## 2016-12-09 DIAGNOSIS — J449 Chronic obstructive pulmonary disease, unspecified: Secondary | ICD-10-CM | POA: Diagnosis not present

## 2016-12-09 DIAGNOSIS — M1990 Unspecified osteoarthritis, unspecified site: Secondary | ICD-10-CM | POA: Diagnosis not present

## 2016-12-09 DIAGNOSIS — F329 Major depressive disorder, single episode, unspecified: Secondary | ICD-10-CM | POA: Diagnosis not present

## 2016-12-09 DIAGNOSIS — Z9181 History of falling: Secondary | ICD-10-CM | POA: Diagnosis not present

## 2016-12-09 DIAGNOSIS — I951 Orthostatic hypotension: Secondary | ICD-10-CM | POA: Diagnosis not present

## 2016-12-14 DIAGNOSIS — R42 Dizziness and giddiness: Secondary | ICD-10-CM | POA: Diagnosis not present

## 2016-12-14 DIAGNOSIS — Z8673 Personal history of transient ischemic attack (TIA), and cerebral infarction without residual deficits: Secondary | ICD-10-CM | POA: Diagnosis not present

## 2016-12-14 DIAGNOSIS — I509 Heart failure, unspecified: Secondary | ICD-10-CM | POA: Diagnosis not present

## 2016-12-14 DIAGNOSIS — I951 Orthostatic hypotension: Secondary | ICD-10-CM | POA: Diagnosis not present

## 2016-12-14 DIAGNOSIS — F329 Major depressive disorder, single episode, unspecified: Secondary | ICD-10-CM | POA: Diagnosis not present

## 2016-12-14 DIAGNOSIS — I110 Hypertensive heart disease with heart failure: Secondary | ICD-10-CM | POA: Diagnosis not present

## 2016-12-14 DIAGNOSIS — Z9181 History of falling: Secondary | ICD-10-CM | POA: Diagnosis not present

## 2016-12-14 DIAGNOSIS — M1990 Unspecified osteoarthritis, unspecified site: Secondary | ICD-10-CM | POA: Diagnosis not present

## 2016-12-14 DIAGNOSIS — J449 Chronic obstructive pulmonary disease, unspecified: Secondary | ICD-10-CM | POA: Diagnosis not present

## 2016-12-16 DIAGNOSIS — I509 Heart failure, unspecified: Secondary | ICD-10-CM | POA: Diagnosis not present

## 2016-12-16 DIAGNOSIS — M1990 Unspecified osteoarthritis, unspecified site: Secondary | ICD-10-CM | POA: Diagnosis not present

## 2016-12-16 DIAGNOSIS — R42 Dizziness and giddiness: Secondary | ICD-10-CM | POA: Diagnosis not present

## 2016-12-16 DIAGNOSIS — Z8673 Personal history of transient ischemic attack (TIA), and cerebral infarction without residual deficits: Secondary | ICD-10-CM | POA: Diagnosis not present

## 2016-12-16 DIAGNOSIS — I951 Orthostatic hypotension: Secondary | ICD-10-CM | POA: Diagnosis not present

## 2016-12-16 DIAGNOSIS — I110 Hypertensive heart disease with heart failure: Secondary | ICD-10-CM | POA: Diagnosis not present

## 2016-12-16 DIAGNOSIS — F329 Major depressive disorder, single episode, unspecified: Secondary | ICD-10-CM | POA: Diagnosis not present

## 2016-12-16 DIAGNOSIS — Z9181 History of falling: Secondary | ICD-10-CM | POA: Diagnosis not present

## 2016-12-16 DIAGNOSIS — J449 Chronic obstructive pulmonary disease, unspecified: Secondary | ICD-10-CM | POA: Diagnosis not present

## 2016-12-21 DIAGNOSIS — J449 Chronic obstructive pulmonary disease, unspecified: Secondary | ICD-10-CM | POA: Diagnosis not present

## 2016-12-21 DIAGNOSIS — I110 Hypertensive heart disease with heart failure: Secondary | ICD-10-CM | POA: Diagnosis not present

## 2016-12-21 DIAGNOSIS — Z9181 History of falling: Secondary | ICD-10-CM | POA: Diagnosis not present

## 2016-12-21 DIAGNOSIS — I951 Orthostatic hypotension: Secondary | ICD-10-CM | POA: Diagnosis not present

## 2016-12-21 DIAGNOSIS — I509 Heart failure, unspecified: Secondary | ICD-10-CM | POA: Diagnosis not present

## 2016-12-21 DIAGNOSIS — M1990 Unspecified osteoarthritis, unspecified site: Secondary | ICD-10-CM | POA: Diagnosis not present

## 2016-12-21 DIAGNOSIS — F329 Major depressive disorder, single episode, unspecified: Secondary | ICD-10-CM | POA: Diagnosis not present

## 2016-12-21 DIAGNOSIS — R42 Dizziness and giddiness: Secondary | ICD-10-CM | POA: Diagnosis not present

## 2016-12-21 DIAGNOSIS — Z8673 Personal history of transient ischemic attack (TIA), and cerebral infarction without residual deficits: Secondary | ICD-10-CM | POA: Diagnosis not present

## 2016-12-24 DIAGNOSIS — I110 Hypertensive heart disease with heart failure: Secondary | ICD-10-CM | POA: Diagnosis not present

## 2016-12-24 DIAGNOSIS — R42 Dizziness and giddiness: Secondary | ICD-10-CM | POA: Diagnosis not present

## 2016-12-24 DIAGNOSIS — M1990 Unspecified osteoarthritis, unspecified site: Secondary | ICD-10-CM | POA: Diagnosis not present

## 2016-12-24 DIAGNOSIS — J449 Chronic obstructive pulmonary disease, unspecified: Secondary | ICD-10-CM | POA: Diagnosis not present

## 2016-12-24 DIAGNOSIS — F329 Major depressive disorder, single episode, unspecified: Secondary | ICD-10-CM | POA: Diagnosis not present

## 2016-12-24 DIAGNOSIS — Z9181 History of falling: Secondary | ICD-10-CM | POA: Diagnosis not present

## 2016-12-24 DIAGNOSIS — Z8673 Personal history of transient ischemic attack (TIA), and cerebral infarction without residual deficits: Secondary | ICD-10-CM | POA: Diagnosis not present

## 2016-12-24 DIAGNOSIS — I509 Heart failure, unspecified: Secondary | ICD-10-CM | POA: Diagnosis not present

## 2016-12-24 DIAGNOSIS — I951 Orthostatic hypotension: Secondary | ICD-10-CM | POA: Diagnosis not present

## 2016-12-29 DIAGNOSIS — J449 Chronic obstructive pulmonary disease, unspecified: Secondary | ICD-10-CM | POA: Diagnosis not present

## 2016-12-29 DIAGNOSIS — R42 Dizziness and giddiness: Secondary | ICD-10-CM | POA: Diagnosis not present

## 2016-12-29 DIAGNOSIS — I951 Orthostatic hypotension: Secondary | ICD-10-CM | POA: Diagnosis not present

## 2016-12-29 DIAGNOSIS — M1990 Unspecified osteoarthritis, unspecified site: Secondary | ICD-10-CM | POA: Diagnosis not present

## 2016-12-29 DIAGNOSIS — Z8673 Personal history of transient ischemic attack (TIA), and cerebral infarction without residual deficits: Secondary | ICD-10-CM | POA: Diagnosis not present

## 2016-12-29 DIAGNOSIS — I110 Hypertensive heart disease with heart failure: Secondary | ICD-10-CM | POA: Diagnosis not present

## 2016-12-29 DIAGNOSIS — Z9181 History of falling: Secondary | ICD-10-CM | POA: Diagnosis not present

## 2016-12-29 DIAGNOSIS — I509 Heart failure, unspecified: Secondary | ICD-10-CM | POA: Diagnosis not present

## 2016-12-29 DIAGNOSIS — F329 Major depressive disorder, single episode, unspecified: Secondary | ICD-10-CM | POA: Diagnosis not present

## 2016-12-31 DIAGNOSIS — I110 Hypertensive heart disease with heart failure: Secondary | ICD-10-CM | POA: Diagnosis not present

## 2016-12-31 DIAGNOSIS — I951 Orthostatic hypotension: Secondary | ICD-10-CM | POA: Diagnosis not present

## 2016-12-31 DIAGNOSIS — I509 Heart failure, unspecified: Secondary | ICD-10-CM | POA: Diagnosis not present

## 2016-12-31 DIAGNOSIS — M1990 Unspecified osteoarthritis, unspecified site: Secondary | ICD-10-CM | POA: Diagnosis not present

## 2016-12-31 DIAGNOSIS — J449 Chronic obstructive pulmonary disease, unspecified: Secondary | ICD-10-CM | POA: Diagnosis not present

## 2016-12-31 DIAGNOSIS — Z9181 History of falling: Secondary | ICD-10-CM | POA: Diagnosis not present

## 2016-12-31 DIAGNOSIS — F329 Major depressive disorder, single episode, unspecified: Secondary | ICD-10-CM | POA: Diagnosis not present

## 2016-12-31 DIAGNOSIS — Z8673 Personal history of transient ischemic attack (TIA), and cerebral infarction without residual deficits: Secondary | ICD-10-CM | POA: Diagnosis not present

## 2016-12-31 DIAGNOSIS — R42 Dizziness and giddiness: Secondary | ICD-10-CM | POA: Diagnosis not present

## 2017-01-05 DIAGNOSIS — F329 Major depressive disorder, single episode, unspecified: Secondary | ICD-10-CM | POA: Diagnosis not present

## 2017-01-05 DIAGNOSIS — I509 Heart failure, unspecified: Secondary | ICD-10-CM | POA: Diagnosis not present

## 2017-01-05 DIAGNOSIS — R42 Dizziness and giddiness: Secondary | ICD-10-CM | POA: Diagnosis not present

## 2017-01-05 DIAGNOSIS — Z8673 Personal history of transient ischemic attack (TIA), and cerebral infarction without residual deficits: Secondary | ICD-10-CM | POA: Diagnosis not present

## 2017-01-05 DIAGNOSIS — I951 Orthostatic hypotension: Secondary | ICD-10-CM | POA: Diagnosis not present

## 2017-01-05 DIAGNOSIS — I110 Hypertensive heart disease with heart failure: Secondary | ICD-10-CM | POA: Diagnosis not present

## 2017-01-05 DIAGNOSIS — J449 Chronic obstructive pulmonary disease, unspecified: Secondary | ICD-10-CM | POA: Diagnosis not present

## 2017-01-05 DIAGNOSIS — Z9181 History of falling: Secondary | ICD-10-CM | POA: Diagnosis not present

## 2017-01-05 DIAGNOSIS — M1990 Unspecified osteoarthritis, unspecified site: Secondary | ICD-10-CM | POA: Diagnosis not present

## 2017-01-07 DIAGNOSIS — M1990 Unspecified osteoarthritis, unspecified site: Secondary | ICD-10-CM | POA: Diagnosis not present

## 2017-01-07 DIAGNOSIS — Z8673 Personal history of transient ischemic attack (TIA), and cerebral infarction without residual deficits: Secondary | ICD-10-CM | POA: Diagnosis not present

## 2017-01-07 DIAGNOSIS — I509 Heart failure, unspecified: Secondary | ICD-10-CM | POA: Diagnosis not present

## 2017-01-07 DIAGNOSIS — I110 Hypertensive heart disease with heart failure: Secondary | ICD-10-CM | POA: Diagnosis not present

## 2017-01-07 DIAGNOSIS — I951 Orthostatic hypotension: Secondary | ICD-10-CM | POA: Diagnosis not present

## 2017-01-07 DIAGNOSIS — J449 Chronic obstructive pulmonary disease, unspecified: Secondary | ICD-10-CM | POA: Diagnosis not present

## 2017-01-07 DIAGNOSIS — F329 Major depressive disorder, single episode, unspecified: Secondary | ICD-10-CM | POA: Diagnosis not present

## 2017-01-07 DIAGNOSIS — Z9181 History of falling: Secondary | ICD-10-CM | POA: Diagnosis not present

## 2017-01-07 DIAGNOSIS — R42 Dizziness and giddiness: Secondary | ICD-10-CM | POA: Diagnosis not present

## 2017-01-26 DIAGNOSIS — H353221 Exudative age-related macular degeneration, left eye, with active choroidal neovascularization: Secondary | ICD-10-CM | POA: Diagnosis not present

## 2017-02-23 DIAGNOSIS — F341 Dysthymic disorder: Secondary | ICD-10-CM | POA: Diagnosis not present

## 2017-02-23 DIAGNOSIS — M1990 Unspecified osteoarthritis, unspecified site: Secondary | ICD-10-CM | POA: Diagnosis not present

## 2017-02-23 DIAGNOSIS — J301 Allergic rhinitis due to pollen: Secondary | ICD-10-CM | POA: Diagnosis not present

## 2017-02-23 DIAGNOSIS — I10 Essential (primary) hypertension: Secondary | ICD-10-CM | POA: Diagnosis not present

## 2017-03-11 DIAGNOSIS — T1490XA Injury, unspecified, initial encounter: Secondary | ICD-10-CM | POA: Diagnosis not present

## 2017-04-26 DIAGNOSIS — H353221 Exudative age-related macular degeneration, left eye, with active choroidal neovascularization: Secondary | ICD-10-CM | POA: Diagnosis not present

## 2017-04-29 DIAGNOSIS — G8190 Hemiplegia, unspecified affecting unspecified side: Secondary | ICD-10-CM | POA: Diagnosis not present

## 2017-04-29 DIAGNOSIS — I951 Orthostatic hypotension: Secondary | ICD-10-CM | POA: Diagnosis not present

## 2017-04-29 DIAGNOSIS — I10 Essential (primary) hypertension: Secondary | ICD-10-CM | POA: Diagnosis not present

## 2017-04-29 DIAGNOSIS — J301 Allergic rhinitis due to pollen: Secondary | ICD-10-CM | POA: Diagnosis not present

## 2017-06-21 DIAGNOSIS — I10 Essential (primary) hypertension: Secondary | ICD-10-CM | POA: Diagnosis not present

## 2017-06-21 DIAGNOSIS — J019 Acute sinusitis, unspecified: Secondary | ICD-10-CM | POA: Diagnosis not present

## 2017-06-21 DIAGNOSIS — J449 Chronic obstructive pulmonary disease, unspecified: Secondary | ICD-10-CM | POA: Diagnosis not present

## 2017-06-21 DIAGNOSIS — I509 Heart failure, unspecified: Secondary | ICD-10-CM | POA: Diagnosis not present

## 2017-07-27 DIAGNOSIS — H353221 Exudative age-related macular degeneration, left eye, with active choroidal neovascularization: Secondary | ICD-10-CM | POA: Diagnosis not present

## 2017-08-14 ENCOUNTER — Encounter (HOSPITAL_COMMUNITY): Payer: Self-pay | Admitting: *Deleted

## 2017-08-14 ENCOUNTER — Emergency Department (HOSPITAL_COMMUNITY): Payer: Medicare HMO

## 2017-08-14 ENCOUNTER — Observation Stay (HOSPITAL_COMMUNITY)
Admission: EM | Admit: 2017-08-14 | Discharge: 2017-08-16 | Disposition: A | Discharge status: Home / Self Care | Payer: Medicare HMO | Attending: Pulmonary Disease | Admitting: Pulmonary Disease

## 2017-08-14 DIAGNOSIS — Z79899 Other long term (current) drug therapy: Secondary | ICD-10-CM | POA: Insufficient documentation

## 2017-08-14 DIAGNOSIS — Z87891 Personal history of nicotine dependence: Secondary | ICD-10-CM | POA: Diagnosis not present

## 2017-08-14 DIAGNOSIS — R079 Chest pain, unspecified: Secondary | ICD-10-CM

## 2017-08-14 DIAGNOSIS — R55 Syncope and collapse: Secondary | ICD-10-CM | POA: Diagnosis not present

## 2017-08-14 DIAGNOSIS — N179 Acute kidney failure, unspecified: Secondary | ICD-10-CM | POA: Diagnosis not present

## 2017-08-14 DIAGNOSIS — I110 Hypertensive heart disease with heart failure: Secondary | ICD-10-CM | POA: Diagnosis not present

## 2017-08-14 DIAGNOSIS — M25511 Pain in right shoulder: Secondary | ICD-10-CM | POA: Diagnosis not present

## 2017-08-14 DIAGNOSIS — J449 Chronic obstructive pulmonary disease, unspecified: Secondary | ICD-10-CM | POA: Insufficient documentation

## 2017-08-14 DIAGNOSIS — I5030 Unspecified diastolic (congestive) heart failure: Secondary | ICD-10-CM | POA: Insufficient documentation

## 2017-08-14 DIAGNOSIS — R296 Repeated falls: Secondary | ICD-10-CM | POA: Diagnosis not present

## 2017-08-14 DIAGNOSIS — I10 Essential (primary) hypertension: Secondary | ICD-10-CM | POA: Diagnosis present

## 2017-08-14 DIAGNOSIS — S0990XA Unspecified injury of head, initial encounter: Secondary | ICD-10-CM | POA: Diagnosis not present

## 2017-08-14 DIAGNOSIS — R51 Headache: Secondary | ICD-10-CM | POA: Diagnosis not present

## 2017-08-14 DIAGNOSIS — I2510 Atherosclerotic heart disease of native coronary artery without angina pectoris: Secondary | ICD-10-CM | POA: Diagnosis not present

## 2017-08-14 DIAGNOSIS — R102 Pelvic and perineal pain: Secondary | ICD-10-CM | POA: Diagnosis not present

## 2017-08-14 DIAGNOSIS — Z7982 Long term (current) use of aspirin: Secondary | ICD-10-CM | POA: Insufficient documentation

## 2017-08-14 DIAGNOSIS — R072 Precordial pain: Secondary | ICD-10-CM | POA: Insufficient documentation

## 2017-08-14 DIAGNOSIS — N393 Stress incontinence (female) (male): Secondary | ICD-10-CM | POA: Diagnosis not present

## 2017-08-14 DIAGNOSIS — J45909 Unspecified asthma, uncomplicated: Secondary | ICD-10-CM | POA: Insufficient documentation

## 2017-08-14 DIAGNOSIS — S3993XA Unspecified injury of pelvis, initial encounter: Secondary | ICD-10-CM | POA: Diagnosis not present

## 2017-08-14 HISTORY — DX: Syncope and collapse: R55

## 2017-08-14 HISTORY — DX: Chest pain, unspecified: R07.9

## 2017-08-14 LAB — COMPREHENSIVE METABOLIC PANEL
ALK PHOS: 84 U/L (ref 38–126)
ALT: 12 U/L — AB (ref 14–54)
ANION GAP: 8 (ref 5–15)
AST: 14 U/L — ABNORMAL LOW (ref 15–41)
Albumin: 3.7 g/dL (ref 3.5–5.0)
BUN: 26 mg/dL — ABNORMAL HIGH (ref 6–20)
CALCIUM: 9 mg/dL (ref 8.9–10.3)
CO2: 30 mmol/L (ref 22–32)
CREATININE: 1.74 mg/dL — AB (ref 0.44–1.00)
Chloride: 98 mmol/L — ABNORMAL LOW (ref 101–111)
GFR, EST AFRICAN AMERICAN: 29 mL/min — AB (ref >60)
GFR, EST NON AFRICAN AMERICAN: 25 mL/min — AB (ref >60)
Glucose, Bld: 104 mg/dL — ABNORMAL HIGH (ref 65–99)
Potassium: 3.4 mmol/L — ABNORMAL LOW (ref 3.5–5.1)
SODIUM: 136 mmol/L (ref 135–145)
TOTAL PROTEIN: 6.5 g/dL (ref 6.5–8.1)
Total Bilirubin: 0.5 mg/dL (ref 0.3–1.2)

## 2017-08-14 LAB — CBC WITH DIFFERENTIAL/PLATELET
BASOS ABS: 0 10*3/uL (ref 0.0–0.1)
BASOS PCT: 0 %
EOS ABS: 0.3 10*3/uL (ref 0.0–0.7)
Eosinophils Relative: 5 %
HEMATOCRIT: 38.1 % (ref 36.0–46.0)
HEMOGLOBIN: 11.9 g/dL — AB (ref 12.0–15.0)
Lymphocytes Relative: 19 %
Lymphs Abs: 1.3 10*3/uL (ref 0.7–4.0)
MCH: 27.5 pg (ref 26.0–34.0)
MCHC: 31.2 g/dL (ref 30.0–36.0)
MCV: 88.2 fL (ref 78.0–100.0)
Monocytes Absolute: 0.5 10*3/uL (ref 0.1–1.0)
Monocytes Relative: 8 %
NEUTROS ABS: 4.5 10*3/uL (ref 1.7–7.7)
NEUTROS PCT: 68 %
Platelets: 232 10*3/uL (ref 150–400)
RBC: 4.32 MIL/uL (ref 3.87–5.11)
RDW: 15.4 % (ref 11.5–15.5)
WBC: 6.6 10*3/uL (ref 4.0–10.5)

## 2017-08-14 LAB — TROPONIN I

## 2017-08-14 MED ORDER — SODIUM CHLORIDE 0.9 % IV SOLN
Freq: Once | INTRAVENOUS | Status: AC
  Administered 2017-08-14: 21:00:00 via INTRAVENOUS

## 2017-08-14 MED ORDER — ENOXAPARIN SODIUM 30 MG/0.3ML ~~LOC~~ SOLN
30.0000 mg | SUBCUTANEOUS | Status: DC
  Filled 2017-08-14: qty 0.3

## 2017-08-14 NOTE — ED Notes (Signed)
Pt assisted with bedpan, pt unable to urinate at this time.

## 2017-08-14 NOTE — ED Notes (Signed)
Patient transported to X-ray 

## 2017-08-14 NOTE — H&P (Signed)
Kristie Morris is an 83 y.o. female.   Chief Complaint: falls  The patient was examined, evaluated, and admitted on 08/14/2017.  HPI:   PRIMARY CARE PROVIDER: Dr. Luan Pulling  HPI: The patient is a delightfully spry 83 yo woman with COPD, HTN, chronic diastolic CHF, who presents after having 2 falls today, at least one of which was a syncopal episode. The patient reports she stood up and was walking with her walker out of the bedroom when she then woke up on the bedroom floor; she does not think she was out for very long. Her family member heard her and also agrees that it was just moments before he reached her, and she was awake when he walked in the room. She did not have any odd feeling, palpitations, or other symptoms prior to the event. She did hit her head and her right shoulder. The second time she fell she slipped on a rug in the bathroom. Patient has had falls in the past but never this frequently. Patient reports she has arthritis in her knees and sometimes they give out on her.  Onset: 2 falls today, but has had them in the past. Duration: intermittent.  Location: generalized.  Character: no warning before the falls. Alleviated by: Nothing. Exacerbated by: Nothing. Associated Symptoms: No fever or chills. Right shoulder pain after the fall. Mild headache bilaterally frontal, characterized as dull, does not radiate, started actually before the first fall, has been intermittent.  Treatments: none at home except usual medications.  In addition, she has had some chest pain:  Onset: Started 2 - 4 weeks ago. Duration: intermittent. She reports she sometimes wakes up with chest pain.  Location: substernal. Radiation: None. Character: 5/10, heaviness, "like someone is sitting on my chest." Alleviated by: Sitting up. Exacerbated by: Nothing. Associated Symptoms: Minimal shortness of breath. No palpitations. No diaphoresis. No abdominal pain.  Treatments: none at home except usual  medications.     Past Medical History:  Diagnosis Date  . Anxiety   . Arthritis    knee  . Asthma   . CAD (coronary artery disease) cardiologist-  dr Bronson Ing  ([facility] in [place])   2003  Cath with nonobstructive CAD  . Chronic headache   . Chronic lower back pain   . COPD (chronic obstructive pulmonary disease) ([place])   . Diastolic CHF, chronic ([place])   . Duodenal diverticulum   . Edema of left lower extremity   . Fibromyalgia   . Full dentures   . Gait disorder    uses walker  . GERD (gastroesophageal reflux disease)   . Heart murmur   . History of breast cancer   . History of Clostridium difficile   . History of colitis    2001  . History of CVA with residual deficit    06/ 2015  residual left hemiparesis  . History of esophageal dilatation   . History of falling    last fall 06-25-2015  . History of hiatal hernia   . History of small bowel obstruction    04-25-2015  resolved without surgical intervention  . Lesion of bladder   . Low vision, one eye    right eye  . Macular degeneration of both eyes    right eye now low vision/  left eye getting injections currently  . Malignant hypertension   . OAB (overactive bladder)   . Schatzki's ring   . Sciatica of right side 05/29/2015  . Urge and stress incontinence   .  Varicose veins   . Venous insufficiency, peripheral     Past Surgical History:  Procedure Laterality Date  . [place]   from colon  . ABDOMINAL HYSTERECTOMY  1954   w/ bilateral salpingoophorectomy  . ANTERIOR CERVICAL DECOMP/DISCECTOMY FUSION  1971   "used bone off of my right hip"  . BREAST LUMPECTOMY Left 2006  . CARDIAC CATHETERIZATION  11-29-2001  dr Tressia Miners turner   Non-obstructive CAD/  20% pLAD,  30% pD1, 50% mRCA/  normal LVF  . CATARACT EXTRACTION W/ INTRAOCULAR LENS  IMPLANT, BILATERAL  right 2008//  left 2015  . DILATION AND CURETTAGE OF UTERUS  1953    miscarriage  . [place]  .  KNEE ARTHROSCOPY Right 2005  . MASTECTOMY, PARTIAL Right 1999  . OVARIAN CYST REMOVAL  1946  . PATELLA FRACTURE SURGERY Left 2008  . TRANSTHORACIC ECHOCARDIOGRAM  01-09-2015   mild LVH,  ef 67-89%, grade I diastolic dysfunction/  mild MR/  trivial pericardial effusion was identified    Family History  Problem Relation Age of Onset  . Heart attack Mother   . CAD Mother   . Heart attack Sister   . Cancer - Ovarian Sister   . Cancer - Colon Father    Social History:  reports that she quit smoking about 30 years ago. Her smoking use included cigarettes. She has a 7.50 pack-year smoking history. she has never used smokeless tobacco. She reports that she does not drink alcohol or use drugs.  Allergies:  Allergies  Allergen Reactions  . Zithromax [Azithromycin] Diarrhea    Pt in hosp for 2 weeks   . Celebrex [Celecoxib] Hives  . Ciprofloxacin Hives  . Metronidazole Hives  . Morphine And Related Rash  . Moxifloxacin Hives  . Penicillins Hives    Has patient had a PCN reaction causing immediate rash, facial/tongue/throat swelling, SOB or lightheadedness with hypotension: No Has patient had a PCN reaction causing severe rash involving mucus membranes or skin necrosis: No Has patient had a PCN reaction that required hospitalization: No Has patient had a PCN reaction occurring within the last 10 years: No If all of the above answers are "NO", then may proceed with Cephalosporin use.   . Polymyxin B Hives  . Sulfa Antibiotics Hives  . Vancomycin Hives  . Vioxx [Rofecoxib] Hives    No current facility-administered medications on file prior to encounter.    Current Outpatient Medications on File Prior to Encounter  Medication Sig Dispense Refill  . acetaminophen (TYLENOL) 500 MG tablet Take 1,000 mg daily as needed by mouth for mild pain or moderate pain.    Marland Kitchen albuterol (PROVENTIL HFA;VENTOLIN HFA) 108 (90 BASE) MCG/ACT inhaler Inhale 2 puffs into the lungs every 6 (six) hours as  needed for wheezing or shortness of breath.    . ALPRAZolam (XANAX) 0.25 MG tablet Take 0.25 mg by mouth 2 (two) times daily as needed for anxiety.    Marland Kitchen aspirin EC 81 MG tablet Take 81 mg by mouth daily.    Marland Kitchen escitalopram (LEXAPRO) 10 MG tablet Take 10 mg at bedtime by mouth.    . Fluticasone-Salmeterol (ADVAIR) 250-50 MCG/DOSE AEPB Inhale 2 puffs into the lungs every evening.     . gabapentin (NEURONTIN) 100 MG capsule TAKE (1) CAPSULE BY MOUTH THREE TIMES DAILY 90 capsule 0  . hydrochlorothiazide (HYDRODIURIL) 25 MG tablet Take 25 mg every morning by mouth.     . meloxicam (MOBIC) 7.5 MG tablet  Take 7.5 mg 2 (two) times daily by mouth.    . nitroGLYCERIN (NITROSTAT) 0.4 MG SL tablet Place 0.4 mg every 5 (five) minutes as needed under the tongue.     Marland Kitchen oxybutynin (DITROPAN) 5 MG tablet Take 5 mg 2 (two) times daily by mouth.    . pantoprazole (PROTONIX) 40 MG tablet Take 40 mg daily by mouth.    . polyethylene glycol powder (GLYCOLAX/MIRALAX) powder Take 17 g daily as needed by mouth for mild constipation.    . Probiotic Product (ALIGN PO) Take 1 capsule by mouth daily.    Marland Kitchen topiramate (TOPAMAX) 25 MG tablet Take 1 tablet (25 mg total) by mouth at bedtime. (Patient taking differently: Take 50 mg by mouth at bedtime. ) 30 tablet 3    Physical Exam: GENERAL: Ill-appearing, well nourished, well-developed.  HEENT: Normocephalic, atraumatic; pupils equal and round. Nares patent, without discharge or bleeding. No oropharyngeal lesions or erythema. Mucous membranes are dry.  NECK: is supple, no masses, trachea midline.  RESPIRATORY: Clear to auscultation bilaterally. Chest wall movements are symmetric. No use of accessory muscles to breathe.  No wheezing, rales, rhonchi. CARDIOVASCULAR: Normal S1, S2. No rubs, or gallops. PMI non-displaced. Carotids: no carotid bruits. No bradycardia or tachycardia. DP pulses 2+ bilaterally.  GI: soft, nontender, non-distended, normal active bowel sounds. No  hepatosplenomegaly.  INTEGUMENT: Clean, dry, and intact. No rashes. MUSCULOSKELETAL: Moving all extremities. No cyanosis. No clubbing. Edema: none bilaterally.  NEUROLOGICAL: Cranial nerves 2-12 grossly intact. Reflexes: 2+ bilaterally. Babinski: toes downgoing bilaterally.  Motor 5/5 throughout. Sensory grossly intact to light touch. Intact rapid alternating movements bilaterally. No pronator drift.  PSYCHIATRIC: Fully oriented. Normal and appropriate affect.  LYMPHATIC: No cervical lymphadenopathy. No supraclavicular lymphadenopathy.    (Not in a hospital admission)  Results for orders placed or performed during the hospital encounter of 08/14/17 (from the past 48 hour(s))  Comprehensive metabolic panel     Status: Abnormal   Collection Time: 08/14/17  6:36 PM  Result Value Ref Range   Sodium 136 135 - 145 mmol/L   Potassium 3.4 (L) 3.5 - 5.1 mmol/L   Chloride 98 (L) 101 - 111 mmol/L   CO2 30 22 - 32 mmol/L   Glucose, Bld 104 (H) 65 - 99 mg/dL   BUN 26 (H) 6 - 20 mg/dL   Creatinine, Ser 1.74 (H) 0.44 - 1.00 mg/dL   Calcium 9.0 8.9 - 10.3 mg/dL   Total Protein 6.5 6.5 - 8.1 g/dL   Albumin 3.7 3.5 - 5.0 g/dL   AST 14 (L) 15 - 41 U/L   ALT 12 (L) 14 - 54 U/L   Alkaline Phosphatase 84 38 - 126 U/L   Total Bilirubin 0.5 0.3 - 1.2 mg/dL   GFR calc non Af Amer 25 (L) >60 mL/min   GFR calc Af Amer 29 (L) >60 mL/min    Comment: (NOTE) The eGFR has been calculated using the CKD EPI equation. This calculation has not been validated in all clinical situations. eGFR's persistently <60 mL/min signify possible Chronic Kidney Disease.    Anion gap 8 5 - 15  CBC with Differential     Status: Abnormal   Collection Time: 08/14/17  6:36 PM  Result Value Ref Range   WBC 6.6 4.0 - 10.5 K/uL   RBC 4.32 3.87 - 5.11 MIL/uL   Hemoglobin 11.9 (L) 12.0 - 15.0 g/dL   HCT 38.1 36.0 - 46.0 %   MCV 88.2 78.0 - 100.0 fL  MCH 27.5 26.0 - 34.0 pg   MCHC 31.2 30.0 - 36.0 g/dL   RDW 15.4 11.5 - 15.5 %    Platelets 232 150 - 400 K/uL   Neutrophils Relative % 68 %   Neutro Abs 4.5 1.7 - 7.7 K/uL   Lymphocytes Relative 19 %   Lymphs Abs 1.3 0.7 - 4.0 K/uL   Monocytes Relative 8 %   Monocytes Absolute 0.5 0.1 - 1.0 K/uL   Eosinophils Relative 5 %   Eosinophils Absolute 0.3 0.0 - 0.7 K/uL   Basophils Relative 0 %   Basophils Absolute 0.0 0.0 - 0.1 K/uL  Troponin I     Status: None   Collection Time: 08/14/17  6:36 PM  Result Value Ref Range   Troponin I <0.03 <0.03 ng/mL   Dg Chest 2 View  Result Date: 08/14/2017 CLINICAL DATA:  Per ED notes: Pt comes in with family because of recent falls. Pt states she was using her walker today and just felt herself fall over. Denies and syncopal episode. Pt complains of right arm pain and left head pain. Pt alert and oriented EXAM: CHEST  2 VIEW COMPARISON:  None. FINDINGS: Normal mediastinum and cardiac silhouette. Normal pulmonary vasculature. No evidence of effusion, infiltrate, or pneumothorax. No acute bony abnormality. Bilateral axillary lymphadenectomy clips IMPRESSION: No acute cardiopulmonary process. Electronically Signed   By: Suzy Bouchard M.D.   On: 08/14/2017 20:18   Dg Pelvis 1-2 Views  Result Date: 08/14/2017 CLINICAL DATA:  Recent falls.  Pain. EXAM: PELVIS - 1-2 VIEW COMPARISON:  04/22/2015 CT abdomen/ pelvis. Degenerative changes are present in the visualized lower lumbar spine. FINDINGS: There is no evidence of pelvic fracture or diastasis. No pelvic bone lesions are seen. IMPRESSION: No fracture. Electronically Signed   By: Ilona Sorrel M.D.   On: 08/14/2017 20:21   Dg Shoulder Right  Result Date: 08/14/2017 CLINICAL DATA:  83 y/o  F; right arm pain. EXAM: RIGHT SHOULDER - 2+ VIEW COMPARISON:  None. FINDINGS: There is no evidence of fracture or dislocation. There is no evidence of arthropathy or other focal bone abnormality. Soft tissues are unremarkable. Surgical clips project over right axilla. IMPRESSION: No acute fracture or  dislocation identified. Electronically Signed   By: Kristine Garbe M.D.   On: 08/14/2017 20:23   Ct Head Wo Contrast  Result Date: 08/14/2017 CLINICAL DATA:  Recent falls.  Headache. EXAM: CT HEAD WITHOUT CONTRAST TECHNIQUE: Contiguous axial images were obtained from the base of the skull through the vertex without intravenous contrast. COMPARISON:  01/08/2015 head CT. FINDINGS: Brain: Stable lacunar infarct in the right deep frontal white matter. No evidence of parenchymal hemorrhage or extra-axial fluid collection. No mass lesion, mass effect, or midline shift. No CT evidence of acute infarction. Nonspecific moderate subcortical and periventricular white matter hypodensity, most in keeping with chronic small vessel ischemic change. Generalized cerebral volume loss. Cerebral ventricle sizes are stable and concordant with the degree of cerebral volume loss. Vascular: No acute abnormality. Skull: No evidence of calvarial fracture. Sinuses/Orbits: The visualized paranasal sinuses are essentially clear. Other:  The mastoid air cells are unopacified. IMPRESSION: 1. No evidence of acute intracranial abnormality. No evidence of calvarial fracture. 2. Generalized cerebral volume loss and chronic ischemic changes in the cerebral white matter . Electronically Signed   By: Ilona Sorrel M.D.   On: 08/14/2017 20:11    Review of Systems  Constitutional: Positive for malaise/fatigue. Negative for chills, diaphoresis and fever.  HENT: Negative for congestion,  ear discharge, ear pain, nosebleeds and sore throat.   Eyes: Negative for pain and discharge.  Respiratory: Positive for shortness of breath and wheezing. Negative for cough and stridor.   Cardiovascular: Positive for chest pain. Negative for palpitations.  Gastrointestinal: Negative for abdominal pain, blood in stool, constipation, diarrhea, nausea and vomiting.  Genitourinary: Negative for dysuria and hematuria.  Musculoskeletal: Positive for joint  pain. Negative for myalgias.  Skin: Negative for itching and rash.  Neurological: Positive for headaches. Negative for sensory change, speech change and focal weakness.  Endo/Heme/Allergies: Negative for polydipsia. Does not bruise/bleed easily.  Psychiatric/Behavioral: Negative for depression. The patient is not nervous/anxious.     Blood pressure (!) 181/71, pulse 68, temperature 98.1 F (36.7 C), temperature source Oral, resp. rate 20, height _0  (1.6 m), weight 91.6 kg (202 lb), SpO2 96 %. Physical Exam   Physical Exam: GENERAL: Ill-appearing, well nourished, well-developed.  HEENT: Normocephalic, atraumatic; pupils equal and round. Nares patent, without discharge or bleeding. No oropharyngeal lesions or erythema. Mucous membranes are dry.  NECK: is supple, no masses, trachea midline.  RESPIRATORY: Clear to auscultation bilaterally. Chest wall movements are symmetric. No use of accessory muscles to breathe.  No rales, rhonchi. Bilateral inspiratory and expiratory wheezing.  CARDIOVASCULAR: Normal S1, S2. Murmur 2/6 systolic. No rubs, or gallops. PMI non-displaced. Carotids: no carotid bruits. No bradycardia or tachycardia. DP pulses 2+ bilaterally. Edema: trace bilaterally. Capillary refill less than 3 seconds.  GI: soft, nontender, non-distended, normal active bowel sounds. No hepatosplenomegaly.  INTEGUMENT: Clean, dry, and intact. No rashes. MUSCULOSKELETAL: Moving all extremities. No cyanosis. No clubbing. Mild tenderness in right shoulder. NEUROLOGICAL: Cranial nerves 2-12 grossly intact. Reflexes: 2+ bilaterally. Babinski: toes downgoing bilaterally.  Motor 4/5 throughout. Sensory grossly intact to light touch. Intact rapid alternating movements bilaterally. No pronator drift.  PSYCHIATRIC: Fully oriented. Normal and appropriate affect.  LYMPHATIC: No cervical lymphadenopathy. No supraclavicular lymphadenopathy.   Assessment/Plan  Syncope Likely related to dehydration with  chronic balance issues. Plan: Trial of IVF. Telemetry. Monitor neuro exam.   AKI (acute kidney injury) ([place]) Admission Cr 1.7, baseline Cr 1.2.  Plan: Likely due to dehydration. Trial of IVF. Avoid nephrotoxins. Recheck Cr in am. If no improvement consider further evaluation.   COPD (chronic obstructive pulmonary disease) ([facility]) COPD with active wheezing on exam. Unclear if this is her baseline. Plan: Continue home meds. Add Duo-Neb scheduled. Albuterol neb prn. If no improvement consider adding IV or PO steroids. Monitor O2 saturation.   Recurrent falls Physical therapy evaluation and treatment.   Essential hypertension Continue home meds except stop HCTZ due to dehydration.  Chest pain Minimal and was resolved at time of admission. Plan: Telemetry. Troponin x 3.  PRN nitro. Further workup could be considered depending on course. Follow up with primary care.     Tacey Ruiz, MD 08/14/2017, 9:09 PM

## 2017-08-14 NOTE — ED Provider Notes (Signed)
Emergency Department Provider Note   I have reviewed the triage vital signs and the nursing notes.   HISTORY  Chief Complaint Fall   HPI Kristie Morris is a 83 y.o. female with PMH of CAD, COPD, and fibromyalgia presents to the emergency department for evaluation after 2 falls today at home.  Patient states that the first fall involved her standing with her walker using both hands when she suddenly felt very lightheaded.  Patient states that she lost consciousness and then woke on the floor.  She had pain to the left side of her head and believes that she may have struck the wall during her fall.  She is complaining also of some right arm pain.  She lives with family who came to help her up.  She was able to stand but then had a second fall short time later.  During this fall she feels that she may have slipped on the floor.  Denies head trauma during this incident.  No loss of consciousness.  Patient describes some chest pain today but has had intermittent chest discomfort weeks.  Today's chest pain episodes did seem worse.  Patient also describes an episode of difficulty talking several days ago that resolved.  Family feels that she may have just been tired or slightly stressed because she was talking to her sister on the phone.  Patient denies any chest pain, difficulty speaking, weakness/numbness at this time.   PCP: Dr. Luan Pulling   Past Medical History:  Diagnosis Date  . Anxiety   . Arthritis    knee  . Asthma   . CAD (coronary artery disease) cardiologist-  dr Bronson Ing  ([facility] in [place])   2003  Cath with nonobstructive CAD  . Chronic headache   . Chronic lower back pain   . COPD (chronic obstructive pulmonary disease) ([place])   . Diastolic CHF, chronic ([place])   . Duodenal diverticulum   . Edema of left lower extremity   . Fibromyalgia   . Full dentures   . Gait disorder    uses walker  . GERD (gastroesophageal reflux disease)   . Heart murmur   . History  of breast cancer   . History of Clostridium difficile   . History of colitis    2001  . History of CVA with residual deficit    06/ 2015  residual left hemiparesis  . History of esophageal dilatation   . History of falling    last fall 06-25-2015  . History of hiatal hernia   . History of small bowel obstruction    04-25-2015  resolved without surgical intervention  . Lesion of bladder   . Low vision, one eye    right eye  . Macular degeneration of both eyes    right eye now low vision/  left eye getting injections currently  . Malignant hypertension   . OAB (overactive bladder)   . Schatzki's ring   . Sciatica of right side 05/29/2015  . Urge and stress incontinence   . Varicose veins   . Venous insufficiency, peripheral     Patient Active Problem List   Diagnosis Date Noted  . Syncope 08/14/2017  . Sciatica of right side 05/29/2015  . Essential hypertension 04/27/2015  . Personal history of stroke with current residual effects 04/27/2015  . Abdominal pain 04/22/2015  . SBO (small bowel obstruction) ([place]) 04/22/2015  . Headache disorder 01/17/2015  . Protein-calorie malnutrition, severe ([place]) 01/10/2015  . Abnormality of gait 01/08/2015  .  Recurrent falls 01/08/2015  . Pre-syncope 01/08/2015  . Left-sided weakness 04/02/2014  . Malignant hypertension 02/02/2014  . Diastolic dysfunction 32/99/2426  . Asthma, chronic 02/02/2014  . COPD (chronic obstructive pulmonary disease) ([place]) 02/02/2014  . Anemia 02/02/2014  . Hyperlipidemia 02/02/2014  . Dyspnea 01/31/2014    Past Surgical History:  Procedure Laterality Date  . Kulm   from colon  . ABDOMINAL HYSTERECTOMY  1954   w/ bilateral salpingoophorectomy  . ANTERIOR CERVICAL DECOMP/DISCECTOMY FUSION  1971   "used bone off of my right hip"  . BREAST LUMPECTOMY Left 2006  . CARDIAC CATHETERIZATION  11-29-2001  dr Tressia Miners turner   Non-obstructive CAD/  20% pLAD,  30% pD1, 50% mRCA/  normal  LVF  . CATARACT EXTRACTION W/ INTRAOCULAR LENS  IMPLANT, BILATERAL  right 2008//  left 2015  . DILATION AND CURETTAGE OF UTERUS  1953    miscarriage  . [place]  . KNEE ARTHROSCOPY Right 2005  . MASTECTOMY, PARTIAL Right 1999  . OVARIAN CYST REMOVAL  1946  . PATELLA FRACTURE SURGERY Left 2008  . TRANSTHORACIC ECHOCARDIOGRAM  01-09-2015   mild LVH,  ef 83-41%, grade I diastolic dysfunction/  mild MR/  trivial pericardial effusion was identified      Allergies Zithromax [azithromycin]; Celebrex [celecoxib]; Ciprofloxacin; Metronidazole; Morphine and related; Moxifloxacin; Penicillins; Polymyxin b; Sulfa antibiotics; Vancomycin; and Vioxx [rofecoxib]  Family History  Problem Relation Age of Onset  . Heart attack Mother   . CAD Mother   . Heart attack Sister   . Cancer - Ovarian Sister   . Cancer - Colon Father     Social History Social History   Tobacco Use  . Smoking status: Former Smoker    Packs/day: 0.50    Years: 15.00    Pack years: 7.50    Types: Cigarettes    Last attempt to quit: 07/17/1987    Years since quitting: 30.0  . Smokeless tobacco: Never Used  Substance Use Topics  . Alcohol use: No    Alcohol/week: 0.0 oz  . Drug use: No    Review of Systems  Constitutional: No fever/chills Eyes: No visual changes. ENT: No sore throat. Cardiovascular: Positive chest pain and questionable syncope.  Respiratory: Denies shortness of breath. Gastrointestinal: No abdominal pain.  No nausea, no vomiting.  No diarrhea.  No constipation. Genitourinary: Negative for dysuria. Musculoskeletal: Negative for back pain. Positive right arm/shoulder pain.  Skin: Negative for rash. Neurological: Negative for focal weakness or numbness. Positive left sided HA.   10-point ROS otherwise negative.  ____________________________________________   PHYSICAL EXAM:  VITAL SIGNS: ED Triage Vitals  Enc Vitals Group     BP 08/14/17 1734 (!) 179/76     Pulse Rate  08/14/17 1734 96     Resp 08/14/17 1734 18     Temp 08/14/17 1736 98.1 F (36.7 C)     Temp Source 08/14/17 1736 Oral     SpO2 08/14/17 1734 96 %     Weight 08/14/17 1734 202 lb (91.6 kg)     Height 08/14/17 1734 5\' 3"  (1.6 m)     Pain Score 08/14/17 1734 6   Constitutional: Alert and oriented. Well appearing and in no acute distress. Eyes: Conjunctivae are normal. PERRL.  Head: Atraumatic. Nose: No congestion/rhinnorhea. Mouth/Throat: Mucous membranes are moist.  Neck: No stridor. No cervical spine tenderness to palpation. Cardiovascular: Normal rate, regular rhythm. Good peripheral circulation. Grossly normal heart sounds.   Respiratory: Normal respiratory  effort.  No retractions. Lungs CTAB. Gastrointestinal: Soft and nontender. No distention.  Musculoskeletal: No lower extremity tenderness nor edema. No gross deformities of extremities. Pain with passive ROM of the left hip and knee (patient and family report this is baseline). Normal ROM of the right hip/knee.  Neurologic:  Normal speech and language. No gross focal neurologic deficits are appreciated.  Skin:  Skin is warm, dry and intact. No rash noted.  ____________________________________________   LABS (all labs ordered are listed, but only abnormal results are displayed)  Labs Reviewed  COMPREHENSIVE METABOLIC PANEL - Abnormal; Notable for the following components:      Result Value   Potassium 3.4 (*)    Chloride 98 (*)    Glucose, Bld 104 (*)    BUN 26 (*)    Creatinine, Ser 1.74 (*)    AST 14 (*)    ALT 12 (*)    GFR calc non Af Amer 25 (*)    GFR calc Af Amer 29 (*)    All other components within normal limits  CBC WITH DIFFERENTIAL/PLATELET - Abnormal; Notable for the following components:   Hemoglobin 11.9 (*)    All other components within normal limits  URINE CULTURE  TROPONIN I  URINALYSIS, ROUTINE W REFLEX MICROSCOPIC   ____________________________________________  EKG  EKG reviewed but no  crossing over to MUSE.   HR: 72 PR: 224 QTc: 420 NSR. No STEMI. Narrow QRS.      ____________________________________________  RADIOLOGY  Dg Chest 2 View  Result Date: 08/14/2017 CLINICAL DATA:  Per ED notes: Pt comes in with family because of recent falls. Pt states she was using her walker today and just felt herself fall over. Denies and syncopal episode. Pt complains of right arm pain and left head pain. Pt alert and oriented EXAM: CHEST  2 VIEW COMPARISON:  None. FINDINGS: Normal mediastinum and cardiac silhouette. Normal pulmonary vasculature. No evidence of effusion, infiltrate, or pneumothorax. No acute bony abnormality. Bilateral axillary lymphadenectomy clips IMPRESSION: No acute cardiopulmonary process. Electronically Signed   By: Suzy Bouchard M.D.   On: 08/14/2017 20:18   Dg Pelvis 1-2 Views  Result Date: 08/14/2017 CLINICAL DATA:  Recent falls.  Pain. EXAM: PELVIS - 1-2 VIEW COMPARISON:  04/22/2015 CT abdomen/ pelvis. Degenerative changes are present in the visualized lower lumbar spine. FINDINGS: There is no evidence of pelvic fracture or diastasis. No pelvic bone lesions are seen. IMPRESSION: No fracture. Electronically Signed   By: Ilona Sorrel M.D.   On: 08/14/2017 20:21   Dg Shoulder Right  Result Date: 08/14/2017 CLINICAL DATA:  83 y/o  F; right arm pain. EXAM: RIGHT SHOULDER - 2+ VIEW COMPARISON:  None. FINDINGS: There is no evidence of fracture or dislocation. There is no evidence of arthropathy or other focal bone abnormality. Soft tissues are unremarkable. Surgical clips project over right axilla. IMPRESSION: No acute fracture or dislocation identified. Electronically Signed   By: Kristine Garbe M.D.   On: 08/14/2017 20:23   Ct Head Wo Contrast  Result Date: 08/14/2017 CLINICAL DATA:  Recent falls.  Headache. EXAM: CT HEAD WITHOUT CONTRAST TECHNIQUE: Contiguous axial images were obtained from the base of the skull through the vertex without  intravenous contrast. COMPARISON:  01/08/2015 head CT. FINDINGS: Brain: Stable lacunar infarct in the right deep frontal white matter. No evidence of parenchymal hemorrhage or extra-axial fluid collection. No mass lesion, mass effect, or midline shift. No CT evidence of acute infarction. Nonspecific moderate subcortical and periventricular white matter  hypodensity, most in keeping with chronic small vessel ischemic change. Generalized cerebral volume loss. Cerebral ventricle sizes are stable and concordant with the degree of cerebral volume loss. Vascular: No acute abnormality. Skull: No evidence of calvarial fracture. Sinuses/Orbits: The visualized paranasal sinuses are essentially clear. Other:  The mastoid air cells are unopacified. IMPRESSION: 1. No evidence of acute intracranial abnormality. No evidence of calvarial fracture. 2. Generalized cerebral volume loss and chronic ischemic changes in the cerebral white matter . Electronically Signed   By: Ilona Sorrel M.D.   On: 08/14/2017 20:11    ____________________________________________   PROCEDURES  Procedure(s) performed:   Procedures  None ____________________________________________   INITIAL IMPRESSION / ASSESSMENT AND PLAN / ED COURSE  Pertinent labs & imaging results that were available during my care of the patient were reviewed by me and considered in my medical decision making (see chart for details).  Presents to the emergency department for evaluation after 2 falls today.  The first fall has some elements concerning for syncopal event.  The patient was having some chest discomfort earlier in the day but none currently.  In terms of musculoskeletal pain the patient is complaining of right shoulder discomfort and has some baseline discomfort in her hips.  Plan for imaging of the pelvis, right shoulder, chest.  Will obtain CT scan of the head.  Patient has no cervical spine discomfort.  Not anticoagulated.  Plan to also obtain EKG,  labs, UA with questionable syncope event with the first fall. Recently started on Meloxicam but no other med changes.   08:57 PM Patient labs and imaging reviewed.  Patient's kidney function is slightly worse than 2 years ago but unclear if this is an acute change.  Given her age, earlier chest pain, syncope plan for observational admission for enzyme trending, IV fluids, and consideration of ECHO. No abnormal rhythm on telemetry here in the ED. Discussed with the patient and family who are in agreement with the plan.   Discussed patient's case with Hospitalist to request admission. Patient and family (if present) updated with plan. Care transferred to Hospitalist service.  I reviewed all nursing notes, vitals, pertinent old records, EKGs, labs, imaging (as available).  ____________________________________________  FINAL CLINICAL IMPRESSION(S) / ED DIAGNOSES  Final diagnoses:  Syncope, unspecified syncope type  Precordial chest pain  Injury of head, initial encounter  Acute pain of right shoulder     MEDICATIONS GIVEN DURING THIS VISIT:  Medications  enoxaparin (LOVENOX) injection 30 mg (not administered)  0.9 %  sodium chloride infusion ( Intravenous Restarted 08/14/17 2150)    Note:  This document was prepared using Dragon voice recognition software and may include unintentional dictation errors.  Nanda Quinton, MD Emergency Medicine    Mathilda Maguire, [facility], MD 08/14/17 [phone]

## 2017-08-14 NOTE — ED Notes (Signed)
Call for report  Tsha, RN will call back

## 2017-08-14 NOTE — ED Triage Notes (Signed)
Pt comes in with family because of recent falls. Pt states she was using her walker today and just felt herself fall over. Denies and syncopal episode. Pt complains of right arm pain and left head pain. Pt alert and oriented

## 2017-08-15 ENCOUNTER — Other Ambulatory Visit: Payer: Self-pay

## 2017-08-15 ENCOUNTER — Encounter (HOSPITAL_COMMUNITY): Payer: Self-pay | Admitting: Internal Medicine

## 2017-08-15 ENCOUNTER — Observation Stay (HOSPITAL_COMMUNITY): Payer: Medicare HMO

## 2017-08-15 ENCOUNTER — Observation Stay (HOSPITAL_BASED_OUTPATIENT_CLINIC_OR_DEPARTMENT_OTHER): Payer: Medicare HMO

## 2017-08-15 DIAGNOSIS — R55 Syncope and collapse: Secondary | ICD-10-CM | POA: Diagnosis not present

## 2017-08-15 LAB — ECHOCARDIOGRAM COMPLETE
CHL CUP RV SYS PRESS: 21 mmHg
E decel time: 359 msec
E/e' ratio: 13.03
FS: 49 % — AB (ref 28–44)
HEIGHTINCHES: 63 in
IVS/LV PW RATIO, ED: 0.89
LA ID, A-P, ES: 37 mm
LA diam end sys: 37 mm
LA diam index: 1.91 cm/m2
LA vol A4C: 15.1 ml
LDCA: 3.14 cm2
LV E/e' medial: 13.03
LV E/e'average: 13.03
LV TDI E'LATERAL: 6.2
LVELAT: 6.2 cm/s
LVOTD: 20 mm
MV Dec: 359
MV pk A vel: 122 m/s
MVPG: 3 mmHg
MVPKEVEL: 80.8 m/s
PW: 11.1 mm — AB (ref 0.6–1.1)
RV LATERAL S' VELOCITY: 12.3 cm/s
RV TAPSE: 21.6 mm
Reg peak vel: 213 cm/s
TDI e' medial: 5.11
TR max vel: 213 cm/s
WEIGHTICAEL: 3216.95 [oz_av]

## 2017-08-15 LAB — CBC
HEMATOCRIT: 36.2 % (ref 36.0–46.0)
Hemoglobin: 11.2 g/dL — ABNORMAL LOW (ref 12.0–15.0)
MCH: 27.3 pg (ref 26.0–34.0)
MCHC: 30.9 g/dL (ref 30.0–36.0)
MCV: 88.3 fL (ref 78.0–100.0)
PLATELETS: 219 10*3/uL (ref 150–400)
RBC: 4.1 MIL/uL (ref 3.87–5.11)
RDW: 15.5 % (ref 11.5–15.5)
WBC: 5.7 10*3/uL (ref 4.0–10.5)

## 2017-08-15 LAB — URINALYSIS, ROUTINE W REFLEX MICROSCOPIC
Bilirubin Urine: NEGATIVE
GLUCOSE, UA: NEGATIVE mg/dL
HGB URINE DIPSTICK: NEGATIVE
KETONES UR: NEGATIVE mg/dL
Leukocytes, UA: NEGATIVE
Nitrite: NEGATIVE
PH: 6 (ref 5.0–8.0)
PROTEIN: NEGATIVE mg/dL
Specific Gravity, Urine: 1.009 (ref 1.005–1.030)

## 2017-08-15 LAB — BASIC METABOLIC PANEL
Anion gap: 8 (ref 5–15)
BUN: 23 mg/dL — AB (ref 6–20)
CALCIUM: 8.9 mg/dL (ref 8.9–10.3)
CO2: 29 mmol/L (ref 22–32)
CREATININE: 1.47 mg/dL — AB (ref 0.44–1.00)
Chloride: 102 mmol/L (ref 101–111)
GFR calc Af Amer: 35 mL/min — ABNORMAL LOW (ref >60)
GFR, EST NON AFRICAN AMERICAN: 30 mL/min — AB (ref >60)
Glucose, Bld: 101 mg/dL — ABNORMAL HIGH (ref 65–99)
POTASSIUM: 3.5 mmol/L (ref 3.5–5.1)
SODIUM: 139 mmol/L (ref 135–145)

## 2017-08-15 LAB — MAGNESIUM: MAGNESIUM: 2 mg/dL (ref 1.7–2.4)

## 2017-08-15 MED ORDER — NITROGLYCERIN 0.4 MG SL SUBL: 0.4000 mg | SUBLINGUAL_TABLET | SUBLINGUAL | Status: DC | PRN

## 2017-08-15 MED ORDER — ACETAMINOPHEN 500 MG PO TABS
1000.0000 mg | ORAL_TABLET | Freq: Every day | ORAL | Status: DC | PRN
  Administered 2017-08-15: 1000 mg via ORAL
  Filled 2017-08-15: qty 2

## 2017-08-15 MED ORDER — IPRATROPIUM-ALBUTEROL 0.5-2.5 (3) MG/3ML IN SOLN
3.0000 mL | Freq: Four times a day (QID) | RESPIRATORY_TRACT | Status: DC
  Administered 2017-08-15: 3 mL via RESPIRATORY_TRACT
  Filled 2017-08-15: qty 3

## 2017-08-15 MED ORDER — SENNOSIDES-DOCUSATE SODIUM 8.6-50 MG PO TABS: 1.0000 | ORAL_TABLET | Freq: Every evening | ORAL | Status: DC | PRN

## 2017-08-15 MED ORDER — GABAPENTIN 100 MG PO CAPS
100.0000 mg | ORAL_CAPSULE | Freq: Three times a day (TID) | ORAL | Status: DC
  Administered 2017-08-15 – 2017-08-16 (×4): 100 mg via ORAL
  Filled 2017-08-15 (×4): qty 1

## 2017-08-15 MED ORDER — POTASSIUM CHLORIDE CRYS ER 20 MEQ PO TBCR
20.0000 meq | EXTENDED_RELEASE_TABLET | Freq: Once | ORAL | Status: AC
  Administered 2017-08-15: 20 meq via ORAL
  Filled 2017-08-15: qty 1

## 2017-08-15 MED ORDER — ENOXAPARIN SODIUM 30 MG/0.3ML ~~LOC~~ SOLN: 30.0000 mg | SUBCUTANEOUS | Status: DC

## 2017-08-15 MED ORDER — POTASSIUM CHLORIDE CRYS ER 20 MEQ PO TBCR
20.0000 meq | EXTENDED_RELEASE_TABLET | Freq: Two times a day (BID) | ORAL | Status: DC
  Administered 2017-08-15 – 2017-08-16 (×3): 20 meq via ORAL
  Filled 2017-08-15 (×3): qty 1

## 2017-08-15 MED ORDER — POLYETHYLENE GLYCOL 3350 17 G PO PACK: 17.0000 g | PACK | Freq: Every day | ORAL | Status: DC | PRN

## 2017-08-15 MED ORDER — IPRATROPIUM-ALBUTEROL 0.5-2.5 (3) MG/3ML IN SOLN
3.0000 mL | Freq: Two times a day (BID) | RESPIRATORY_TRACT | Status: DC
  Administered 2017-08-15 – 2017-08-16 (×2): 3 mL via RESPIRATORY_TRACT
  Filled 2017-08-15 (×2): qty 3

## 2017-08-15 MED ORDER — RISAQUAD PO CAPS
1.0000 | ORAL_CAPSULE | Freq: Every day | ORAL | Status: DC
  Administered 2017-08-15 – 2017-08-16 (×2): 1 via ORAL
  Filled 2017-08-15 (×2): qty 1

## 2017-08-15 MED ORDER — MELOXICAM 7.5 MG PO TABS
7.5000 mg | ORAL_TABLET | Freq: Two times a day (BID) | ORAL | Status: DC
  Administered 2017-08-15 – 2017-08-16 (×3): 7.5 mg via ORAL
  Filled 2017-08-15 (×7): qty 1

## 2017-08-15 MED ORDER — ALBUTEROL SULFATE (2.5 MG/3ML) 0.083% IN NEBU: 2.5000 mg | INHALATION_SOLUTION | RESPIRATORY_TRACT | Status: DC | PRN

## 2017-08-15 MED ORDER — ESCITALOPRAM OXALATE 10 MG PO TABS
10.0000 mg | ORAL_TABLET | Freq: Every day | ORAL | Status: DC
  Administered 2017-08-15: 10 mg via ORAL
  Filled 2017-08-15: qty 1

## 2017-08-15 MED ORDER — ENOXAPARIN SODIUM 30 MG/0.3ML ~~LOC~~ SOLN
30.0000 mg | SUBCUTANEOUS | Status: DC
  Administered 2017-08-15: 30 mg via SUBCUTANEOUS

## 2017-08-15 MED ORDER — SODIUM CHLORIDE 0.9% FLUSH
3.0000 mL | Freq: Two times a day (BID) | INTRAVENOUS | Status: DC
  Administered 2017-08-15: 3 mL via INTRAVENOUS

## 2017-08-15 MED ORDER — TOPIRAMATE 25 MG PO TABS
50.0000 mg | ORAL_TABLET | Freq: Every day | ORAL | Status: DC
  Administered 2017-08-15: 50 mg via ORAL
  Filled 2017-08-15: qty 2

## 2017-08-15 MED ORDER — PANTOPRAZOLE SODIUM 40 MG PO TBEC
40.0000 mg | DELAYED_RELEASE_TABLET | Freq: Every day | ORAL | Status: DC
Start: 2017-08-15 — End: 2017-08-16
  Administered 2017-08-15 – 2017-08-16 (×2): 40 mg via ORAL
  Filled 2017-08-15 (×3): qty 1

## 2017-08-15 MED ORDER — POLYETHYLENE GLYCOL 3350 17 GM/SCOOP PO POWD
17.0000 g | Freq: Every day | ORAL | Status: DC | PRN
  Filled 2017-08-15: qty 255

## 2017-08-15 MED ORDER — ASPIRIN EC 81 MG PO TBEC
81.0000 mg | DELAYED_RELEASE_TABLET | Freq: Every day | ORAL | Status: DC
  Administered 2017-08-15 – 2017-08-16 (×2): 81 mg via ORAL
  Filled 2017-08-15 (×2): qty 1

## 2017-08-15 MED ORDER — MOMETASONE FURO-FORMOTEROL FUM 200-5 MCG/ACT IN AERO
2.0000 | INHALATION_SPRAY | Freq: Two times a day (BID) | RESPIRATORY_TRACT | Status: DC
  Administered 2017-08-15 – 2017-08-16 (×2): 2 via RESPIRATORY_TRACT
  Filled 2017-08-15: qty 8.8

## 2017-08-15 MED ORDER — POTASSIUM CHLORIDE IN NACL 20-0.9 MEQ/L-% IV SOLN
INTRAVENOUS | Status: DC
  Administered 2017-08-15: 01:00:00 via INTRAVENOUS

## 2017-08-15 MED ORDER — BISACODYL 5 MG PO TBEC: 5.0000 mg | DELAYED_RELEASE_TABLET | Freq: Every day | ORAL | Status: DC | PRN

## 2017-08-15 MED ORDER — OXYBUTYNIN CHLORIDE 5 MG PO TABS
5.0000 mg | ORAL_TABLET | Freq: Two times a day (BID) | ORAL | Status: DC
  Administered 2017-08-15 – 2017-08-16 (×3): 5 mg via ORAL
  Filled 2017-08-15 (×3): qty 1

## 2017-08-15 MED ORDER — TRAZODONE HCL 50 MG PO TABS
25.0000 mg | ORAL_TABLET | Freq: Every evening | ORAL | Status: DC | PRN
  Administered 2017-08-15: 25 mg via ORAL
  Filled 2017-08-15: qty 1

## 2017-08-15 MED ORDER — ALPRAZOLAM 0.25 MG PO TABS
0.2500 mg | ORAL_TABLET | Freq: Two times a day (BID) | ORAL | Status: DC | PRN
Start: 2017-08-15 — End: 2017-08-16

## 2017-08-15 NOTE — Assessment & Plan Note (Signed)
COPD with active wheezing on exam. Unclear if this is her baseline. Plan: Continue home meds. Add Duo-Neb scheduled. Albuterol neb prn. If no improvement consider adding IV or PO steroids. Monitor O2 saturation.

## 2017-08-15 NOTE — Progress Notes (Signed)
Delightful entertaining 83 year old lady who had 2 falls and one syncopal episode yesterday with a four-point wal she denies any antecedent palpitations or chest painDoris KENZEE Morris Morris:[id number] DOB: 12-Aug-1925 DOA: 08/14/2017 PCP: Sinda Du, MD   Physical Exam: Blood pressure (!) 149/57, pulse 76, temperature 97.8 F (36.6 C), temperature source Oral, resp. rate 18, height _0  (1.6 m), weight 91.2 kg (201 lb 1 oz), SpO2 98 %. Lungs diminished breath sounds in the bases no rales wheeze or rhonchi appreciable heart regul 2/6 systolic ejection murmur along the left sternal border no S3-S4 auscultated no heaves thrills or rubs.   Investigations:  No results found for this or any previous visit (from the past 240 hour(s)).   Basic Metabolic Panel: Recent Labs    08/14/17 1836 08/15/17 0511  NA 136 139  K 3.4* 3.5  CL 98* 102  CO2 30 29  GLUCOSE 104* 101*  BUN 26* 23*  CREATININE 1.74* 1.47*  CALCIUM 9.0 8.9  MG  --  2.0   Liver Function Tests: Recent Labs    08/14/17 1836  AST 14*  ALT 12*  ALKPHOS 84  BILITOT 0.5  PROT 6.5  ALBUMIN 3.7     CBC: Recent Labs    08/14/17 1836 08/15/17 0511  WBC 6.6 5.7  NEUTROABS 4.5  --   HGB 11.9* 11.2*  HCT 38.1 36.2  MCV 88.2 88.3  PLT 232 219    Dg Chest 2 View  Result Date: 08/14/2017 CLINICAL DATA:  Per ED notes: Pt comes in with family because of recent falls. Pt states she was using her walker today and just felt herself fall over. Denies and syncopal episode. Pt complains of right arm pain and left head pain. Pt alert and oriented EXAM: CHEST  2 VIEW COMPARISON:  None. FINDINGS: Normal mediastinum and cardiac silhouette. Normal pulmonary vasculature. No evidence of effusion, infiltrate, or pneumothorax. No acute bony abnormality. Bilateral axillary lymphadenectomy clips IMPRESSION: No acute cardiopulmonary process. Electronically Signed   By: Suzy Bouchard M.D.   On: 08/14/2017 20:18   Dg Pelvis 1-2  Views  Result Date: 08/14/2017 CLINICAL DATA:  Recent falls.  Pain. EXAM: PELVIS - 1-2 VIEW COMPARISON:  04/22/2015 CT abdomen/ pelvis. Degenerative changes are present in the visualized lower lumbar spine. FINDINGS: There is no evidence of pelvic fracture or diastasis. No pelvic bone lesions are seen. IMPRESSION: No fracture. Electronically Signed   By: Ilona Sorrel M.D.   On: 08/14/2017 20:21   Dg Shoulder Right  Result Date: 08/14/2017 CLINICAL DATA:  83 y/o  F; right arm pain. EXAM: RIGHT SHOULDER - 2+ VIEW COMPARISON:  None. FINDINGS: There is no evidence of fracture or dislocation. There is no evidence of arthropathy or other focal bone abnormality. Soft tissues are unremarkable. Surgical clips project over right axilla. IMPRESSION: No acute fracture or dislocation identified. Electronically Signed   By: Kristine Garbe M.D.   On: 08/14/2017 20:23   Ct Head Wo Contrast  Result Date: 08/14/2017 CLINICAL DATA:  Recent falls.  Headache. EXAM: CT HEAD WITHOUT CONTRAST TECHNIQUE: Contiguous axial images were obtained from the base of the skull through the vertex without intravenous contrast. COMPARISON:  01/08/2015 head CT. FINDINGS: Brain: Stable lacunar infarct in the right deep frontal white matter. No evidence of parenchymal hemorrhage or extra-axial fluid collection. No mass lesion, mass effect, or midline shift. No CT evidence of acute infarction. Nonspecific moderate subcortical and periventricular white matter hypodensity, most in keeping with chronic small vessel ischemic  change. Generalized cerebral volume loss. Cerebral ventricle sizes are stable and concordant with the degree of cerebral volume loss. Vascular: No acute abnormality. Skull: No evidence of calvarial fracture. Sinuses/Orbits: The visualized paranasal sinuses are essentially clear. Other:  The mastoid air cells are unopacified. IMPRESSION: 1. No evidence of acute intracranial abnormality. No evidence of calvarial  fracture. 2. Generalized cerebral volume loss and chronic ischemic changes in the cerebral white matter . Electronically Signed   By: Ilona Sorrel M.D.   On: 08/14/2017 20:11      Medications:   Impression:  Principal Problem:   Syncope Active Problems:   COPD (chronic obstructive pulmonary disease) ([facility])   Recurrent falls   Essential hypertension   Chest pain   AKI (acute kidney injury) ([place])     Plan: Serum magnesium 2.0 potassium within normal limits.. Will order 2-D echocardiogram which was normal in 2014 as well as carotid ultrasound.. Repeat be met scheduled for a.m. Repeat be met scheduled for a.m.  Consultants:    30 minutes Procedures   Antibiotics:          Time spent: 30 minutes   LOS: 0 days   [name]   08/15/2017, 10:58 AM

## 2017-08-15 NOTE — Progress Notes (Signed)
*  PRELIMINARY RESULTS* Echocardiogram 2D Echocardiogram has been performed.  Leavy Cella 08/15/2017, 1:06 PM

## 2017-08-15 NOTE — Assessment & Plan Note (Signed)
Physical therapy evaluation and treatment.

## 2017-08-15 NOTE — Assessment & Plan Note (Signed)
Minimal and was resolved at time of admission. Plan: Telemetry. Troponin x 3.  PRN nitro. Further workup could be considered depending on course. Follow up with primary care.

## 2017-08-15 NOTE — Assessment & Plan Note (Signed)
Admission Cr 1.7, baseline Cr 1.2.  Plan: Likely due to dehydration. Trial of IVF. Avoid nephrotoxins. Recheck Cr in am. If no improvement consider further evaluation.

## 2017-08-15 NOTE — Assessment & Plan Note (Signed)
Likely related to dehydration with chronic balance issues. Plan: Trial of IVF. Telemetry. Monitor neuro exam.

## 2017-08-15 NOTE — Assessment & Plan Note (Signed)
Continue home meds except stop HCTZ due to dehydration.

## 2017-08-16 ENCOUNTER — Other Ambulatory Visit: Payer: Self-pay | Admitting: *Deleted

## 2017-08-16 ENCOUNTER — Observation Stay (HOSPITAL_COMMUNITY): Payer: Medicare HMO

## 2017-08-16 DIAGNOSIS — R55 Syncope and collapse: Secondary | ICD-10-CM | POA: Diagnosis not present

## 2017-08-16 DIAGNOSIS — I6521 Occlusion and stenosis of right carotid artery: Secondary | ICD-10-CM | POA: Diagnosis not present

## 2017-08-16 LAB — BASIC METABOLIC PANEL
ANION GAP: 8 (ref 5–15)
BUN: 26 mg/dL — ABNORMAL HIGH (ref 6–20)
CALCIUM: 8.8 mg/dL — AB (ref 8.9–10.3)
CO2: 29 mmol/L (ref 22–32)
CREATININE: 1.56 mg/dL — AB (ref 0.44–1.00)
Chloride: 104 mmol/L (ref 101–111)
GFR, EST AFRICAN AMERICAN: 33 mL/min — AB (ref >60)
GFR, EST NON AFRICAN AMERICAN: 28 mL/min — AB (ref >60)
Glucose, Bld: 94 mg/dL (ref 65–99)
Potassium: 4.6 mmol/L (ref 3.5–5.1)
SODIUM: 141 mmol/L (ref 135–145)

## 2017-08-16 LAB — URINE CULTURE: Special Requests: NORMAL

## 2017-08-16 NOTE — Evaluation (Signed)
Physical Therapy Evaluation Patient Details Name: Kristie Morris MRN: [id number] DOB: 08-25-1925 Today's Date: 08/16/2017   History of Present Illness   The patient is a delightfully spry 83 yo woman with COPD, HTN, chronic diastolic CHF, who presents after having 2 falls today, at least one of which was a syncopal episode. The patient reports she stood up and was walking with her walker out of the bedroom when she then woke up on the bedroom floor; she does not think she was out for very long. Her family member heard her and also agrees that it was just moments before he reached her, and she was awake when he walked in the room. She did not have any odd feeling, palpitations, or other symptoms prior to the event. She did hit her head and her right shoulder. The second time she fell she slipped on a rug in the bathroom. Patient has had falls in the past but never this frequently. Patient reports she has arthritis in her knees and sometimes they give out on her.     Clinical Impression  Patient limited to a few steps to transfer to chair with frequent episodes of near loss of balance due to leaning backwards.  Patient tolerated sitting up in chair and states her grand daughter can assist her at home.  Patient will benefit from continued physical therapy in hospital and recommended venue below to increase strength, balance, endurance for safe ADLs and gait.    Follow Up Recommendations SNF;Supervision/Assistance - 24 hour    Equipment Recommendations  None recommended by PT    Recommendations for Other Services       Precautions / Restrictions Precautions Precautions: Fall Restrictions Weight Bearing Restrictions: No      Mobility  Bed Mobility Overal bed mobility: Needs Assistance Bed Mobility: Supine to Sit;Sit to Supine     Supine to sit: Min assist Sit to supine: Min assist      Transfers Overall transfer level: Needs assistance Equipment used: Rolling walker (2  wheeled) Transfers: Sit to/from [facility] Sit to Stand: Min assist Stand pivot transfers: Min assist;Mod assist          Ambulation/Gait Ambulation/Gait assistance: Mod assist Ambulation Distance (Feet): 3 Feet Assistive device: Rolling walker (2 wheeled) Gait Pattern/deviations: Decreased step length - right;Decreased step length - left;Decreased stride length;Decreased stance time - left   Gait velocity interpretation: Below normal speed for age/gender General Gait Details: Patient limited to 5-6 unstedy labored steps to transfer to chair, limited due to poor balance and fear of falling   Stairs            Wheelchair Mobility    Modified Rankin (Stroke Patients Only)       Balance Overall balance assessment: Needs assistance Sitting-balance support: No upper extremity supported;Feet supported Sitting balance-Leahy Scale: Good     Standing balance support: Bilateral upper extremity supported;During functional activity Standing balance-Leahy Scale: Poor                               Pertinent Vitals/Pain Pain Assessment: 0-10 Pain Score: 8  Pain Location: left knee Pain Descriptors / Indicators: Aching Pain Intervention(s): Limited activity within patient's tolerance;Monitored during session    Home Living Family/patient expects to be discharged to:: Private residence Living Arrangements: Other relatives Available Help at Discharge: Family(grandson and daughter) Type of Home: House Home Access: Level entry     Home Layout: One level  Home Equipment: [place] - 2 wheels;Cane - single point;Transport chair;Bedside commode;Shower seat;Grab bars - tub/shower      Prior Function Level of Independence: Independent with assistive device(s)               Hand Dominance        Extremity/Trunk Assessment   Upper Extremity Assessment Upper Extremity Assessment: Generalized weakness    Lower Extremity Assessment Lower  Extremity Assessment: Generalized weakness    Cervical / Trunk Assessment Cervical / Trunk Assessment: Normal  Communication   Communication: No difficulties  Cognition Arousal/Alertness: Awake/alert Behavior During Therapy: WFL for tasks assessed/performed Overall Cognitive Status: Within Functional Limits for tasks assessed                                        General Comments      Exercises     Assessment/Plan    PT Assessment Patient needs continued PT services  PT Problem List Decreased strength;Decreased activity tolerance;Decreased balance;Decreased mobility       PT Treatment Interventions Gait training;Functional mobility training;Therapeutic activities;Therapeutic exercise;Patient/family education    PT Goals (Current goals can be found in the Care Plan section)  Acute Rehab PT Goals Patient Stated Goal: Return home with family to assist PT Goal Formulation: With patient Time For Goal Achievement: 08/20/17 Potential to Achieve Goals: Good    Frequency Min 3X/week   Barriers to discharge        Co-evaluation               AM-PAC PT "6 Clicks" Daily Activity  Outcome Measure Difficulty turning over in bed (including adjusting bedclothes, sheets and blankets)?: A Little Difficulty moving from lying on back to sitting on the side of the bed? : A Little Difficulty sitting down on and standing up from a chair with arms (e.g., wheelchair, bedside commode, etc,.)?: A Lot Help needed moving to and from a bed to chair (including a wheelchair)?: A Lot Help needed walking in hospital room?: A Lot Help needed climbing 3-5 steps with a railing? : Total 6 Click Score: 13    End of Session Equipment Utilized During Treatment: Gait belt Activity Tolerance: Patient limited by fatigue Patient left: in chair;with call bell/phone within reach Nurse Communication: Mobility status PT Visit Diagnosis: Unsteadiness on feet (R26.81);Other  abnormalities of gait and mobility (R26.89);Muscle weakness (generalized) (M62.81)    Time: 3976-7341 PT Time Calculation (min) (ACUTE ONLY): 31 min   Charges:   PT Evaluation $PT Eval Moderate Complexity: 1 Mod PT Treatments $Therapeutic Activity: 8-22 mins   PT G Codes:   PT G-Codes **NOT FOR INPATIENT CLASS** Functional Assessment Tool Used: AM-PAC 6 Clicks Basic Mobility Functional Limitation: Mobility: Walking and moving around Mobility: Walking and Moving Around Current Status (P3790): At least 40 percent but less than 60 percent impaired, limited or restricted Mobility: Walking and Moving Around Goal Status [id number]): At least 40 percent but less than 60 percent impaired, limited or restricted Mobility: Walking and Moving Around Discharge Status [id number]): At least 40 percent but less than 60 percent impaired, limited or restricted    1:34 PM, 08/16/17 Lonell Grandchild, MPT Physical Therapist with [facility] [phone] office [phone] mobile phone

## 2017-08-16 NOTE — Care Management Note (Signed)
Case Management Note  Patient Details  Name: Kristie Morris MRN: [id number] Date of Birth: [birth year]     Expected Discharge Date:     08/16/2017             Expected Discharge Plan:  [place]  In-House Referral:     Discharge planning Services  CM Consult  Post Acute Care Choice:  Home Health Choice offered to:  Patient  DME Arranged:    DME Agency:     HH Arranged:  PT, RN [place] Agency:  [place]  Status of Service:  Completed, signed off  If discussed at Agency of Stay Meetings, dates discussed:    Additional Comments: Patient discharging home today. Recommended for SNF. Patient declines. Discussed with patient and caregives via phone [facility] and [place]). All are agreeable to home health. All are aware of recommendations. Patient would like [facility]. Vaughan Basta of [facility] notified and will obtain orders from chart when available. Dr. Luan Pulling updated and will order Home health.   Travian Kerner, Chauncey [place], RN 08/16/2017, 1:34 PM

## 2017-08-16 NOTE — Plan of Care (Signed)
  Acute Rehab PT Goals(only PT should resolve) Pt Will Go Supine/Side To Sit 08/16/2017 1336 - Progressing by Lonell Grandchild, PT Flowsheets Taken 08/16/2017 1336  Pt will go Supine/Side to Sit with supervision Patient Will Transfer Sit To/From Stand 08/16/2017 1336 - Progressing by Lonell Grandchild, PT Flowsheets Taken 08/16/2017 1336  Patient will transfer sit to/from stand with min guard assist Pt Will Transfer Bed To Chair/Chair To Bed 08/16/2017 1336 - Progressing by Lonell Grandchild, PT Flowsheets Taken 08/16/2017 1336  Pt will Transfer Bed to Chair/Chair to Bed min guard assist Pt Will Ambulate 08/16/2017 1336 - Progressing by Lonell Grandchild, PT Flowsheets Taken 08/16/2017 1336  Pt will Ambulate 15 feet;with minimal assist;with rolling walker  1:37 PM, 08/16/17 Lonell Grandchild, MPT Physical Therapist with [facility] [phone] office [phone] mobile phone

## 2017-08-16 NOTE — Plan of Care (Signed)
Pt have uneventful night.

## 2017-08-16 NOTE — Progress Notes (Signed)
Discharge instructions given, verbalized understanding, out in stable condition via w/c with staff. 

## 2017-08-16 NOTE — Progress Notes (Signed)
Subjective: She was admitted with syncope.  She says she feels okay now and wants to go home.  She does not show orthostasis at this point but that needs to be rechecked.  No cardiac arrhythmias on the monitor.  Objective: Vital signs in last 24 hours: Temp:  [97.5 F (36.4 C)-98.5 F (36.9 C)] 97.5 F (36.4 C) (11/12 0547) Pulse Rate:  [76-79] 79 (11/12 0547) Resp:  [16-18] 18 (11/12 0547) BP: (106-132)/(39-49) 131/49 (11/12 0547) SpO2:  [94 %-97 %] 96 % (11/12 0754) Weight:  [91.9 kg (202 lb 9.6 oz)] 91.9 kg (202 lb 9.6 oz) (11/12 0547) Weight change: 0.273 kg (9.6 oz)    Intake/Output from previous day: 11/11 0701 - 11/12 0700 In: 720 [P.O.:720] Out: 700 [Urine:700]  PHYSICAL EXAM General appearance: alert, cooperative and no distress Resp: clear to auscultation bilaterally Cardio: regular rate and rhythm, S1, S2 normal, no murmur, click, rub or gallop GI: soft, non-tender; bowel sounds normal; no masses,  no organomegaly Extremities: extremities normal, atraumatic, no cyanosis or edema Skin turgor good  Lab Results:  Results for orders placed or performed during the hospital encounter of 08/14/17 (from the past 48 hour(s))  Comprehensive metabolic panel     Status: Abnormal   Collection Time: 08/14/17  6:36 PM  Result Value Ref Range   Sodium 136 135 - 145 mmol/L   Potassium 3.4 (L) 3.5 - 5.1 mmol/L   Chloride 98 (L) 101 - 111 mmol/L   CO2 30 22 - 32 mmol/L   Glucose, Bld 104 (H) 65 - 99 mg/dL   BUN 26 (H) 6 - 20 mg/dL   Creatinine, Ser 1.74 (H) 0.44 - 1.00 mg/dL   Calcium 9.0 8.9 - 10.3 mg/dL   Total Protein 6.5 6.5 - 8.1 g/dL   Albumin 3.7 3.5 - 5.0 g/dL   AST 14 (L) 15 - 41 U/L   ALT 12 (L) 14 - 54 U/L   Alkaline Phosphatase 84 38 - 126 U/L   Total Bilirubin 0.5 0.3 - 1.2 mg/dL   GFR calc non Af Amer 25 (L) >60 mL/min   GFR calc Af Amer 29 (L) >60 mL/min    Comment: (NOTE) The eGFR has been calculated using the CKD EPI equation. This calculation has not  been validated in all clinical situations. eGFR's persistently <60 mL/min signify possible Chronic Kidney Disease.    Anion gap 8 5 - 15  CBC with Differential     Status: Abnormal   Collection Time: 08/14/17  6:36 PM  Result Value Ref Range   WBC 6.6 4.0 - 10.5 K/uL   RBC 4.32 3.87 - 5.11 MIL/uL   Hemoglobin 11.9 (L) 12.0 - 15.0 g/dL   HCT 38.1 36.0 - 46.0 %   MCV 88.2 78.0 - 100.0 fL   MCH 27.5 26.0 - 34.0 pg   MCHC 31.2 30.0 - 36.0 g/dL   RDW 15.4 11.5 - 15.5 %   Platelets 232 150 - 400 K/uL   Neutrophils Relative % 68 %   Neutro Abs 4.5 1.7 - 7.7 K/uL   Lymphocytes Relative 19 %   Lymphs Abs 1.3 0.7 - 4.0 K/uL   Monocytes Relative 8 %   Monocytes Absolute 0.5 0.1 - 1.0 K/uL   Eosinophils Relative 5 %   Eosinophils Absolute 0.3 0.0 - 0.7 K/uL   Basophils Relative 0 %   Basophils Absolute 0.0 0.0 - 0.1 K/uL  Troponin I     Status: None   Collection Time: 08/14/17  6:36 PM  Result Value Ref Range   Troponin I <0.03 <0.03 ng/mL  Urinalysis, Routine w reflex microscopic     Status: Abnormal   Collection Time: 08/15/17 12:43 AM  Result Value Ref Range   Color, Urine STRAW (A) YELLOW   APPearance CLEAR CLEAR   Specific Gravity, Urine 1.009 1.005 - 1.030   pH 6.0 5.0 - 8.0   Glucose, UA NEGATIVE NEGATIVE mg/dL   Hgb urine dipstick NEGATIVE NEGATIVE   Bilirubin Urine NEGATIVE NEGATIVE   Ketones, ur NEGATIVE NEGATIVE mg/dL   Protein, ur NEGATIVE NEGATIVE mg/dL   Nitrite NEGATIVE NEGATIVE   Leukocytes, UA NEGATIVE NEGATIVE  Basic metabolic panel     Status: Abnormal   Collection Time: 08/15/17  5:11 AM  Result Value Ref Range   Sodium 139 135 - 145 mmol/L   Potassium 3.5 3.5 - 5.1 mmol/L   Chloride 102 101 - 111 mmol/L   CO2 29 22 - 32 mmol/L   Glucose, Bld 101 (H) 65 - 99 mg/dL   BUN 23 (H) 6 - 20 mg/dL   Creatinine, Ser 1.47 (H) 0.44 - 1.00 mg/dL   Calcium 8.9 8.9 - 10.3 mg/dL   GFR calc non Af Amer 30 (L) >60 mL/min   GFR calc Af Amer 35 (L) >60 mL/min     Comment: (NOTE) The eGFR has been calculated using the CKD EPI equation. This calculation has not been validated in all clinical situations. eGFR's persistently <60 mL/min signify possible Chronic Kidney Disease.    Anion gap 8 5 - 15  CBC     Status: Abnormal   Collection Time: 08/15/17  5:11 AM  Result Value Ref Range   WBC 5.7 4.0 - 10.5 K/uL   RBC 4.10 3.87 - 5.11 MIL/uL   Hemoglobin 11.2 (L) 12.0 - 15.0 g/dL   HCT 36.2 36.0 - 46.0 %   MCV 88.3 78.0 - 100.0 fL   MCH 27.3 26.0 - 34.0 pg   MCHC 30.9 30.0 - 36.0 g/dL   RDW 15.5 11.5 - 15.5 %   Platelets 219 150 - 400 K/uL  Magnesium     Status: None   Collection Time: 08/15/17  5:11 AM  Result Value Ref Range   Magnesium 2.0 1.7 - 2.4 mg/dL  Basic metabolic panel     Status: Abnormal   Collection Time: 08/16/17  5:16 AM  Result Value Ref Range   Sodium 141 135 - 145 mmol/L   Potassium 4.6 3.5 - 5.1 mmol/L    Comment: DELTA CHECK NOTED   Chloride 104 101 - 111 mmol/L   CO2 29 22 - 32 mmol/L   Glucose, Bld 94 65 - 99 mg/dL   BUN 26 (H) 6 - 20 mg/dL   Creatinine, Ser 1.56 (H) 0.44 - 1.00 mg/dL   Calcium 8.8 (L) 8.9 - 10.3 mg/dL   GFR calc non Af Amer 28 (L) >60 mL/min   GFR calc Af Amer 33 (L) >60 mL/min    Comment: (NOTE) The eGFR has been calculated using the CKD EPI equation. This calculation has not been validated in all clinical situations. eGFR's persistently <60 mL/min signify possible Chronic Kidney Disease.    Anion gap 8 5 - 15    ABGS No results for input(s): PHART, PO2ART, TCO2, HCO3 in the last 72 hours.  Invalid input(s): PCO2 CULTURES No results found for this or any previous visit (from the past 240 hour(s)). Studies/Results: Dg Chest 2 View  Result Date: 08/14/2017 CLINICAL  DATA:  Per ED notes: Pt comes in with family because of recent falls. Pt states she was using her walker today and just felt herself fall over. Denies and syncopal episode. Pt complains of right arm pain and left head pain. Pt  alert and oriented EXAM: CHEST  2 VIEW COMPARISON:  None. FINDINGS: Normal mediastinum and cardiac silhouette. Normal pulmonary vasculature. No evidence of effusion, infiltrate, or pneumothorax. No acute bony abnormality. Bilateral axillary lymphadenectomy clips IMPRESSION: No acute cardiopulmonary process. Electronically Signed   By: Suzy Bouchard M.D.   On: 08/14/2017 20:18   Dg Pelvis 1-2 Views  Result Date: 08/14/2017 CLINICAL DATA:  Recent falls.  Pain. EXAM: PELVIS - 1-2 VIEW COMPARISON:  04/22/2015 CT abdomen/ pelvis. Degenerative changes are present in the visualized lower lumbar spine. FINDINGS: There is no evidence of pelvic fracture or diastasis. No pelvic bone lesions are seen. IMPRESSION: No fracture. Electronically Signed   By: Ilona Sorrel M.D.   On: 08/14/2017 20:21   Dg Shoulder Right  Result Date: 08/14/2017 CLINICAL DATA:  83 y/o  F; right arm pain. EXAM: RIGHT SHOULDER - 2+ VIEW COMPARISON:  None. FINDINGS: There is no evidence of fracture or dislocation. There is no evidence of arthropathy or other focal bone abnormality. Soft tissues are unremarkable. Surgical clips project over right axilla. IMPRESSION: No acute fracture or dislocation identified. Electronically Signed   By: Kristine Garbe M.D.   On: 08/14/2017 20:23   Ct Head Wo Contrast  Result Date: 08/14/2017 CLINICAL DATA:  Recent falls.  Headache. EXAM: CT HEAD WITHOUT CONTRAST TECHNIQUE: Contiguous axial images were obtained from the base of the skull through the vertex without intravenous contrast. COMPARISON:  01/08/2015 head CT. FINDINGS: Brain: Stable lacunar infarct in the right deep frontal white matter. No evidence of parenchymal hemorrhage or extra-axial fluid collection. No mass lesion, mass effect, or midline shift. No CT evidence of acute infarction. Nonspecific moderate subcortical and periventricular white matter hypodensity, most in keeping with chronic small vessel ischemic change. Generalized  cerebral volume loss. Cerebral ventricle sizes are stable and concordant with the degree of cerebral volume loss. Vascular: No acute abnormality. Skull: No evidence of calvarial fracture. Sinuses/Orbits: The visualized paranasal sinuses are essentially clear. Other:  The mastoid air cells are unopacified. IMPRESSION: 1. No evidence of acute intracranial abnormality. No evidence of calvarial fracture. 2. Generalized cerebral volume loss and chronic ischemic changes in the cerebral white matter . Electronically Signed   By: Ilona Sorrel M.D.   On: 08/14/2017 20:11    Medications:  Prior to Admission:  Medications Prior to Admission  Medication Sig Dispense Refill Last Dose  . acetaminophen (TYLENOL) 500 MG tablet Take 1,000 mg daily as needed by mouth for mild pain or moderate pain.   08/13/2017 at Unknown time  . albuterol (PROVENTIL HFA;VENTOLIN HFA) 108 (90 BASE) MCG/ACT inhaler Inhale 2 puffs into the lungs every 6 (six) hours as needed for wheezing or shortness of breath.   08/13/2017 at Unknown time  . ALPRAZolam (XANAX) 0.25 MG tablet Take 0.25 mg by mouth 2 (two) times daily as needed for anxiety.   Past Week at Unknown time  . aspirin EC 81 MG tablet Take 81 mg by mouth daily.   08/14/2017 at Unknown time  . escitalopram (LEXAPRO) 10 MG tablet Take 10 mg at bedtime by mouth.   08/13/2017 at Unknown time  . Fluticasone-Salmeterol (ADVAIR) 250-50 MCG/DOSE AEPB Inhale 2 puffs into the lungs every evening.    08/13/2017 at  Unknown time  . gabapentin (NEURONTIN) 100 MG capsule TAKE (1) CAPSULE BY MOUTH THREE TIMES DAILY 90 capsule 0 08/14/2017 at Unknown time  . hydrochlorothiazide (HYDRODIURIL) 25 MG tablet Take 25 mg every morning by mouth.    08/14/2017 at Unknown time  . meloxicam (MOBIC) 7.5 MG tablet Take 7.5 mg 2 (two) times daily by mouth.   08/14/2017 at Unknown time  . nitroGLYCERIN (NITROSTAT) 0.4 MG SL tablet Place 0.4 mg every 5 (five) minutes as needed under the tongue.    unknown  .  oxybutynin (DITROPAN) 5 MG tablet Take 5 mg 2 (two) times daily by mouth.   08/14/2017 at Unknown time  . pantoprazole (PROTONIX) 40 MG tablet Take 40 mg daily by mouth.   08/14/2017 at Unknown time  . polyethylene glycol powder (GLYCOLAX/MIRALAX) powder Take 17 g daily as needed by mouth for mild constipation.   Past Week at Unknown time  . Probiotic Product (ALIGN PO) Take 1 capsule by mouth daily.   Past Week at Unknown time  . topiramate (TOPAMAX) 25 MG tablet Take 1 tablet (25 mg total) by mouth at bedtime. (Patient taking differently: Take 50 mg by mouth at bedtime. ) 30 tablet 3 08/13/2017 at Unknown time   Scheduled: . acidophilus  1 capsule Oral Daily  . aspirin EC  81 mg Oral Daily  . enoxaparin (LOVENOX) injection  30 mg Subcutaneous Q24H  . escitalopram  10 mg Oral QHS  . gabapentin  100 mg Oral TID  . ipratropium-albuterol  3 mL Nebulization BID  . meloxicam  7.5 mg Oral BID  . mometasone-formoterol  2 puff Inhalation BID  . oxybutynin  5 mg Oral BID  . pantoprazole  40 mg Oral Daily  . potassium chloride  20 mEq Oral BID  . sodium chloride flush  3 mL Intravenous Q12H  . topiramate  50 mg Oral QHS   Continuous:  KYH:[id number], albuterol, ALPRAZolam, bisacodyl, nitroGLYCERIN, polyethylene glycol, senna-docusate, traZODone  Assesment: She was admitted with syncope.  We have not found a cause of that.  She is going to have carotid Doppler today and I will probably discharge her after that if it looks okay but have her wear an event monitor for 2 weeks to see if we can find any cardiac arrhythmias. Principal Problem:   Syncope Active Problems:   COPD (chronic obstructive pulmonary disease) (HCC)   Recurrent falls   Essential hypertension   Chest pain   AKI (acute kidney injury) ([place])    Plan: As above    LOS: 0 days   Kristie Morris L 08/16/2017, 8:45 AM

## 2017-08-18 DIAGNOSIS — R296 Repeated falls: Secondary | ICD-10-CM | POA: Diagnosis not present

## 2017-08-18 DIAGNOSIS — J449 Chronic obstructive pulmonary disease, unspecified: Secondary | ICD-10-CM | POA: Diagnosis not present

## 2017-08-18 DIAGNOSIS — I110 Hypertensive heart disease with heart failure: Secondary | ICD-10-CM | POA: Diagnosis not present

## 2017-08-18 DIAGNOSIS — M797 Fibromyalgia: Secondary | ICD-10-CM | POA: Diagnosis not present

## 2017-08-18 DIAGNOSIS — I5032 Chronic diastolic (congestive) heart failure: Secondary | ICD-10-CM | POA: Diagnosis not present

## 2017-08-18 DIAGNOSIS — R55 Syncope and collapse: Secondary | ICD-10-CM | POA: Diagnosis not present

## 2017-08-18 DIAGNOSIS — G8929 Other chronic pain: Secondary | ICD-10-CM | POA: Diagnosis not present

## 2017-08-18 DIAGNOSIS — I2510 Atherosclerotic heart disease of native coronary artery without angina pectoris: Secondary | ICD-10-CM | POA: Diagnosis not present

## 2017-08-18 DIAGNOSIS — I69354 Hemiplegia and hemiparesis following cerebral infarction affecting left non-dominant side: Secondary | ICD-10-CM | POA: Diagnosis not present

## 2017-08-20 DIAGNOSIS — M797 Fibromyalgia: Secondary | ICD-10-CM | POA: Diagnosis not present

## 2017-08-20 DIAGNOSIS — I110 Hypertensive heart disease with heart failure: Secondary | ICD-10-CM | POA: Diagnosis not present

## 2017-08-20 DIAGNOSIS — G8929 Other chronic pain: Secondary | ICD-10-CM | POA: Diagnosis not present

## 2017-08-20 DIAGNOSIS — I69354 Hemiplegia and hemiparesis following cerebral infarction affecting left non-dominant side: Secondary | ICD-10-CM | POA: Diagnosis not present

## 2017-08-20 DIAGNOSIS — I5032 Chronic diastolic (congestive) heart failure: Secondary | ICD-10-CM | POA: Diagnosis not present

## 2017-08-20 DIAGNOSIS — R55 Syncope and collapse: Secondary | ICD-10-CM | POA: Diagnosis not present

## 2017-08-20 DIAGNOSIS — I2510 Atherosclerotic heart disease of native coronary artery without angina pectoris: Secondary | ICD-10-CM | POA: Diagnosis not present

## 2017-08-20 DIAGNOSIS — R296 Repeated falls: Secondary | ICD-10-CM | POA: Diagnosis not present

## 2017-08-20 DIAGNOSIS — J449 Chronic obstructive pulmonary disease, unspecified: Secondary | ICD-10-CM | POA: Diagnosis not present

## 2017-08-21 ENCOUNTER — Ambulatory Visit (INDEPENDENT_AMBULATORY_CARE_PROVIDER_SITE_OTHER): Payer: Medicare HMO

## 2017-08-21 DIAGNOSIS — R55 Syncope and collapse: Secondary | ICD-10-CM | POA: Diagnosis not present

## 2017-08-22 NOTE — Discharge Summary (Signed)
Physician Discharge Summary  Patient ID: Kristie Morris MRN: [id number] DOB/AGE: [birth year] 83 y.o. Primary Care Physician:Letasha Kershaw, Percell Miller, MD Admit date: 08/14/2017 Discharge date: 08/22/2017    Discharge Diagnoses:   Principal Problem:   Syncope Active Problems:   COPD (chronic obstructive pulmonary disease) (HCC)   Recurrent falls   Essential hypertension   Chest pain   AKI (acute kidney injury) ([place])   Allergies as of 08/16/2017      Reactions   Zithromax [azithromycin] Diarrhea   Pt in hosp for 2 weeks    Celebrex [celecoxib] Hives   Ciprofloxacin Hives   Metronidazole Hives   Morphine And Related Rash   Moxifloxacin Hives   Penicillins Hives   Has patient had a PCN reaction causing immediate rash, facial/tongue/throat swelling, SOB or lightheadedness with hypotension: No Has patient had a PCN reaction causing severe rash involving mucus membranes or skin necrosis: No Has patient had a PCN reaction that required hospitalization: No Has patient had a PCN reaction occurring within the last 10 years: No If all of the above answers are "NO", then may proceed with Cephalosporin use.   Polymyxin B Hives   Sulfa Antibiotics Hives   Vancomycin Hives   Vioxx [rofecoxib] Hives      Medication List    TAKE these medications   acetaminophen 500 MG tablet Commonly known as:  TYLENOL Take 1,000 mg daily as needed by mouth for mild pain or moderate pain.   albuterol 108 (90 Base) MCG/ACT inhaler Commonly known as:  PROVENTIL HFA;VENTOLIN HFA Inhale 2 puffs into the lungs every 6 (six) hours as needed for wheezing or shortness of breath.   ALIGN PO Take 1 capsule by mouth daily.   ALPRAZolam 0.25 MG tablet Commonly known as:  XANAX Take 0.25 mg by mouth 2 (two) times daily as needed for anxiety.   aspirin EC 81 MG tablet Take 81 mg by mouth daily.   escitalopram 10 MG tablet Commonly known as:  LEXAPRO Take 10 mg at bedtime by mouth.   Fluticasone-Salmeterol  250-50 MCG/DOSE Aepb Commonly known as:  ADVAIR Inhale 2 puffs into the lungs every evening.   gabapentin 100 MG capsule Commonly known as:  NEURONTIN TAKE (1) CAPSULE BY MOUTH THREE TIMES DAILY   hydrochlorothiazide 25 MG tablet Commonly known as:  HYDRODIURIL Take 25 mg every morning by mouth.   meloxicam 7.5 MG tablet Commonly known as:  MOBIC Take 7.5 mg 2 (two) times daily by mouth.   nitroGLYCERIN 0.4 MG SL tablet Commonly known as:  NITROSTAT Place 0.4 mg every 5 (five) minutes as needed under the tongue.   oxybutynin 5 MG tablet Commonly known as:  DITROPAN Take 5 mg 2 (two) times daily by mouth.   pantoprazole 40 MG tablet Commonly known as:  PROTONIX Take 40 mg daily by mouth.   polyethylene glycol powder powder Commonly known as:  GLYCOLAX/MIRALAX Take 17 g daily as needed by mouth for mild constipation.   topiramate 25 MG tablet Commonly known as:  TOPAMAX Take 1 tablet (25 mg total) by mouth at bedtime. What changed:  how much to take       Discharged Condition: This is a 83 year old who has COPD hypertension chronic diastolic heart failure and who fell twice at home.  These did not appear to be mechanical falls.  The second time she slipped and fell on a rug in the bathroom but the first fall it seemed like she lost consciousness.  She was brought in for  evaluation and had multiple studies including CT monitor and no etiology was found.  She had orthostatic blood pressure checks and she did not have significant change in her blood pressure.  She was ready for discharge and will have home health services and have an event monitor.    Consults: None  Significant Diagnostic Studies: Dg Chest 2 View  Result Date: 08/14/2017 CLINICAL DATA:  Per ED notes: Pt comes in with family because of recent falls. Pt states she was using her walker today and just felt herself fall over. Denies and syncopal episode. Pt complains of right arm pain and left head pain. Pt  alert and oriented EXAM: CHEST  2 VIEW COMPARISON:  None. FINDINGS: Normal mediastinum and cardiac silhouette. Normal pulmonary vasculature. No evidence of effusion, infiltrate, or pneumothorax. No acute bony abnormality. Bilateral axillary lymphadenectomy clips IMPRESSION: No acute cardiopulmonary process. Electronically Signed   By: Suzy Bouchard M.D.   On: 08/14/2017 20:18   Dg Pelvis 1-2 Views  Result Date: 08/14/2017 CLINICAL DATA:  Recent falls.  Pain. EXAM: PELVIS - 1-2 VIEW COMPARISON:  04/22/2015 CT abdomen/ pelvis. Degenerative changes are present in the visualized lower lumbar spine. FINDINGS: There is no evidence of pelvic fracture or diastasis. No pelvic bone lesions are seen. IMPRESSION: No fracture. Electronically Signed   By: Ilona Sorrel M.D.   On: 08/14/2017 20:21   Dg Shoulder Right  Result Date: 08/14/2017 CLINICAL DATA:  83 y/o  F; right arm pain. EXAM: RIGHT SHOULDER - 2+ VIEW COMPARISON:  None. FINDINGS: There is no evidence of fracture or dislocation. There is no evidence of arthropathy or other focal bone abnormality. Soft tissues are unremarkable. Surgical clips project over right axilla. IMPRESSION: No acute fracture or dislocation identified. Electronically Signed   By: Kristine Garbe M.D.   On: 08/14/2017 20:23   Ct Head Wo Contrast  Result Date: 08/14/2017 CLINICAL DATA:  Recent falls.  Headache. EXAM: CT HEAD WITHOUT CONTRAST TECHNIQUE: Contiguous axial images were obtained from the base of the skull through the vertex without intravenous contrast. COMPARISON:  01/08/2015 head CT. FINDINGS: Brain: Stable lacunar infarct in the right deep frontal white matter. No evidence of parenchymal hemorrhage or extra-axial fluid collection. No mass lesion, mass effect, or midline shift. No CT evidence of acute infarction. Nonspecific moderate subcortical and periventricular white matter hypodensity, most in keeping with chronic small vessel ischemic change. Generalized  cerebral volume loss. Cerebral ventricle sizes are stable and concordant with the degree of cerebral volume loss. Vascular: No acute abnormality. Skull: No evidence of calvarial fracture. Sinuses/Orbits: The visualized paranasal sinuses are essentially clear. Other:  The mastoid air cells are unopacified. IMPRESSION: 1. No evidence of acute intracranial abnormality. No evidence of calvarial fracture. 2. Generalized cerebral volume loss and chronic ischemic changes in the cerebral white matter . Electronically Signed   By: Ilona Sorrel M.D.   On: 08/14/2017 20:11   US Carotid Bilateral  Result Date: 08/16/2017 CLINICAL DATA:  Syncope and collapse EXAM: BILATERAL CAROTID DUPLEX ULTRASOUND TECHNIQUE: Pearline Cables scale imaging, color Doppler and duplex ultrasound were performed of bilateral carotid and vertebral arteries in the neck. COMPARISON:  None. FINDINGS: Criteria: Quantification of carotid stenosis is based on velocity parameters that correlate the residual internal carotid diameter with NASCET-based stenosis levels, using the diameter of the distal internal carotid lumen as the denominator for stenosis measurement. The following velocity measurements were obtained: RIGHT ICA:  127 cm/sec CCA:  295 cm/sec SYSTOLIC ICA/CCA RATIO:  1.0 DIASTOLIC ICA/CCA  RATIO:  0.8 ECA:  122 cm/sec LEFT ICA:  79 cm/sec CCA:  1.1 cm/sec SYSTOLIC ICA/CCA RATIO:  0.7 DIASTOLIC ICA/CCA RATIO:  0.7 ECA:  118 cm/sec RIGHT CAROTID ARTERY: Mild calcified plaque in the bulb. Low resistance internal carotid Doppler pattern there is RIGHT VERTEBRAL ARTERY:  Antegrade. LEFT CAROTID ARTERY: Mild calcified plaque in the bulb. Internal carotid is markedly tortuous. Low resistance internal carotid Doppler pattern. LEFT VERTEBRAL ARTERY:  Antegrade. IMPRESSION: Less than 50% stenosis in the right and left internal carotid arteries. Electronically Signed   By: Marybelle Killings M.D.   On: 08/16/2017 09:58    Lab Results: Basic Metabolic Panel: No  results for input(s): NA, K, CL, CO2, GLUCOSE, BUN, CREATININE, CALCIUM, MG, PHOS in the last 72 hours. Liver Function Tests: No results for input(s): AST, ALT, ALKPHOS, BILITOT, PROT, ALBUMIN in the last 72 hours.   CBC: No results for input(s): WBC, NEUTROABS, HGB, HCT, MCV, PLT in the last 72 hours.  Recent Results (from the past 240 hour(s))  Urine culture     Status: Abnormal   Collection Time: 08/15/17 12:43 AM  Result Value Ref Range Status   Specimen Description URINE, CLEAN CATCH  Final   Special Requests Normal  Final   Culture MULTIPLE SPECIES PRESENT, SUGGEST RECOLLECTION (A)  Final   Report Status 08/16/2017 FINAL  Final     Hospital Course: As above  Discharge Exam: Blood pressure (!) 145/52, pulse 89, temperature 98 F (36.7 C), temperature source Oral, resp. rate 20, height 5\' 3"  (1.6 m), weight 91.9 kg (202 lb 9.6 oz), SpO2 96 %. She is awake and alert.  No orthostatic changes.  Disposition: Home with event monitor she has family that lives with her    Follow-up Information    Sinda Du, MD On 09/01/2017.   Specialty:  Pulmonary Disease Why:  at 2:00 pm Contact information: [address] [place] [address] [phone]           Signed: Virna Livengood L   08/22/2017, 10:00 AM

## 2017-08-23 DIAGNOSIS — J449 Chronic obstructive pulmonary disease, unspecified: Secondary | ICD-10-CM | POA: Diagnosis not present

## 2017-08-23 DIAGNOSIS — R296 Repeated falls: Secondary | ICD-10-CM | POA: Diagnosis not present

## 2017-08-23 DIAGNOSIS — I2510 Atherosclerotic heart disease of native coronary artery without angina pectoris: Secondary | ICD-10-CM | POA: Diagnosis not present

## 2017-08-23 DIAGNOSIS — I5032 Chronic diastolic (congestive) heart failure: Secondary | ICD-10-CM | POA: Diagnosis not present

## 2017-08-23 DIAGNOSIS — R55 Syncope and collapse: Secondary | ICD-10-CM | POA: Diagnosis not present

## 2017-08-23 DIAGNOSIS — I69354 Hemiplegia and hemiparesis following cerebral infarction affecting left non-dominant side: Secondary | ICD-10-CM | POA: Diagnosis not present

## 2017-08-23 DIAGNOSIS — M797 Fibromyalgia: Secondary | ICD-10-CM | POA: Diagnosis not present

## 2017-08-23 DIAGNOSIS — I110 Hypertensive heart disease with heart failure: Secondary | ICD-10-CM | POA: Diagnosis not present

## 2017-08-23 DIAGNOSIS — G8929 Other chronic pain: Secondary | ICD-10-CM | POA: Diagnosis not present

## 2017-08-24 DIAGNOSIS — R55 Syncope and collapse: Secondary | ICD-10-CM | POA: Diagnosis not present

## 2017-08-24 DIAGNOSIS — I69354 Hemiplegia and hemiparesis following cerebral infarction affecting left non-dominant side: Secondary | ICD-10-CM | POA: Diagnosis not present

## 2017-08-24 DIAGNOSIS — G8929 Other chronic pain: Secondary | ICD-10-CM | POA: Diagnosis not present

## 2017-08-24 DIAGNOSIS — M797 Fibromyalgia: Secondary | ICD-10-CM | POA: Diagnosis not present

## 2017-08-24 DIAGNOSIS — J449 Chronic obstructive pulmonary disease, unspecified: Secondary | ICD-10-CM | POA: Diagnosis not present

## 2017-08-24 DIAGNOSIS — R296 Repeated falls: Secondary | ICD-10-CM | POA: Diagnosis not present

## 2017-08-24 DIAGNOSIS — I5032 Chronic diastolic (congestive) heart failure: Secondary | ICD-10-CM | POA: Diagnosis not present

## 2017-08-24 DIAGNOSIS — I110 Hypertensive heart disease with heart failure: Secondary | ICD-10-CM | POA: Diagnosis not present

## 2017-08-24 DIAGNOSIS — I2510 Atherosclerotic heart disease of native coronary artery without angina pectoris: Secondary | ICD-10-CM | POA: Diagnosis not present

## 2017-08-25 DIAGNOSIS — M797 Fibromyalgia: Secondary | ICD-10-CM | POA: Diagnosis not present

## 2017-08-25 DIAGNOSIS — I110 Hypertensive heart disease with heart failure: Secondary | ICD-10-CM | POA: Diagnosis not present

## 2017-08-25 DIAGNOSIS — I2510 Atherosclerotic heart disease of native coronary artery without angina pectoris: Secondary | ICD-10-CM | POA: Diagnosis not present

## 2017-08-25 DIAGNOSIS — R296 Repeated falls: Secondary | ICD-10-CM | POA: Diagnosis not present

## 2017-08-25 DIAGNOSIS — J449 Chronic obstructive pulmonary disease, unspecified: Secondary | ICD-10-CM | POA: Diagnosis not present

## 2017-08-25 DIAGNOSIS — G8929 Other chronic pain: Secondary | ICD-10-CM | POA: Diagnosis not present

## 2017-08-25 DIAGNOSIS — R55 Syncope and collapse: Secondary | ICD-10-CM | POA: Diagnosis not present

## 2017-08-25 DIAGNOSIS — I69354 Hemiplegia and hemiparesis following cerebral infarction affecting left non-dominant side: Secondary | ICD-10-CM | POA: Diagnosis not present

## 2017-08-25 DIAGNOSIS — I5032 Chronic diastolic (congestive) heart failure: Secondary | ICD-10-CM | POA: Diagnosis not present

## 2017-08-27 DIAGNOSIS — R55 Syncope and collapse: Secondary | ICD-10-CM | POA: Diagnosis not present

## 2017-08-27 DIAGNOSIS — I110 Hypertensive heart disease with heart failure: Secondary | ICD-10-CM | POA: Diagnosis not present

## 2017-08-27 DIAGNOSIS — I5032 Chronic diastolic (congestive) heart failure: Secondary | ICD-10-CM | POA: Diagnosis not present

## 2017-08-27 DIAGNOSIS — I2510 Atherosclerotic heart disease of native coronary artery without angina pectoris: Secondary | ICD-10-CM | POA: Diagnosis not present

## 2017-08-27 DIAGNOSIS — M797 Fibromyalgia: Secondary | ICD-10-CM | POA: Diagnosis not present

## 2017-08-27 DIAGNOSIS — G8929 Other chronic pain: Secondary | ICD-10-CM | POA: Diagnosis not present

## 2017-08-27 DIAGNOSIS — J449 Chronic obstructive pulmonary disease, unspecified: Secondary | ICD-10-CM | POA: Diagnosis not present

## 2017-08-27 DIAGNOSIS — I69354 Hemiplegia and hemiparesis following cerebral infarction affecting left non-dominant side: Secondary | ICD-10-CM | POA: Diagnosis not present

## 2017-08-27 DIAGNOSIS — R296 Repeated falls: Secondary | ICD-10-CM | POA: Diagnosis not present

## 2017-08-30 DIAGNOSIS — J449 Chronic obstructive pulmonary disease, unspecified: Secondary | ICD-10-CM | POA: Diagnosis not present

## 2017-08-30 DIAGNOSIS — M797 Fibromyalgia: Secondary | ICD-10-CM | POA: Diagnosis not present

## 2017-08-30 DIAGNOSIS — R296 Repeated falls: Secondary | ICD-10-CM | POA: Diagnosis not present

## 2017-08-30 DIAGNOSIS — R55 Syncope and collapse: Secondary | ICD-10-CM | POA: Diagnosis not present

## 2017-08-30 DIAGNOSIS — I2510 Atherosclerotic heart disease of native coronary artery without angina pectoris: Secondary | ICD-10-CM | POA: Diagnosis not present

## 2017-08-30 DIAGNOSIS — I69354 Hemiplegia and hemiparesis following cerebral infarction affecting left non-dominant side: Secondary | ICD-10-CM | POA: Diagnosis not present

## 2017-08-30 DIAGNOSIS — I110 Hypertensive heart disease with heart failure: Secondary | ICD-10-CM | POA: Diagnosis not present

## 2017-08-30 DIAGNOSIS — G8929 Other chronic pain: Secondary | ICD-10-CM | POA: Diagnosis not present

## 2017-08-30 DIAGNOSIS — I5032 Chronic diastolic (congestive) heart failure: Secondary | ICD-10-CM | POA: Diagnosis not present

## 2017-08-31 ENCOUNTER — Encounter (HOSPITAL_COMMUNITY): Payer: Self-pay | Admitting: *Deleted

## 2017-08-31 ENCOUNTER — Other Ambulatory Visit: Payer: Self-pay

## 2017-08-31 ENCOUNTER — Encounter (HOSPITAL_COMMUNITY): Payer: Self-pay | Admitting: Emergency Medicine

## 2017-08-31 ENCOUNTER — Emergency Department (HOSPITAL_COMMUNITY)
Admission: EM | Admit: 2017-08-31 | Discharge: 2017-08-31 | Disposition: A | Discharge status: Home / Self Care | Payer: Medicare HMO | Source: Home / Self Care

## 2017-08-31 ENCOUNTER — Emergency Department (HOSPITAL_COMMUNITY)
Admission: EM | Admit: 2017-08-31 | Discharge: 2017-08-31 | Disposition: A | Discharge status: Home / Self Care | Payer: Medicare HMO | Attending: Emergency Medicine | Admitting: Emergency Medicine

## 2017-08-31 DIAGNOSIS — M797 Fibromyalgia: Secondary | ICD-10-CM | POA: Diagnosis not present

## 2017-08-31 DIAGNOSIS — I69354 Hemiplegia and hemiparesis following cerebral infarction affecting left non-dominant side: Secondary | ICD-10-CM | POA: Diagnosis not present

## 2017-08-31 DIAGNOSIS — Y999 Unspecified external cause status: Secondary | ICD-10-CM | POA: Insufficient documentation

## 2017-08-31 DIAGNOSIS — I5032 Chronic diastolic (congestive) heart failure: Secondary | ICD-10-CM | POA: Diagnosis not present

## 2017-08-31 DIAGNOSIS — W19XXXA Unspecified fall, initial encounter: Secondary | ICD-10-CM | POA: Insufficient documentation

## 2017-08-31 DIAGNOSIS — Y9389 Activity, other specified: Secondary | ICD-10-CM | POA: Insufficient documentation

## 2017-08-31 DIAGNOSIS — R296 Repeated falls: Secondary | ICD-10-CM | POA: Diagnosis not present

## 2017-08-31 DIAGNOSIS — G8929 Other chronic pain: Secondary | ICD-10-CM | POA: Diagnosis not present

## 2017-08-31 DIAGNOSIS — Y92012 Bathroom of single-family (private) house as the place of occurrence of the external cause: Secondary | ICD-10-CM

## 2017-08-31 DIAGNOSIS — I110 Hypertensive heart disease with heart failure: Secondary | ICD-10-CM | POA: Diagnosis not present

## 2017-08-31 DIAGNOSIS — I2510 Atherosclerotic heart disease of native coronary artery without angina pectoris: Secondary | ICD-10-CM | POA: Diagnosis not present

## 2017-08-31 DIAGNOSIS — R42 Dizziness and giddiness: Secondary | ICD-10-CM

## 2017-08-31 DIAGNOSIS — S0990XA Unspecified injury of head, initial encounter: Secondary | ICD-10-CM | POA: Insufficient documentation

## 2017-08-31 DIAGNOSIS — Y939 Activity, unspecified: Secondary | ICD-10-CM

## 2017-08-31 DIAGNOSIS — J449 Chronic obstructive pulmonary disease, unspecified: Secondary | ICD-10-CM | POA: Diagnosis not present

## 2017-08-31 DIAGNOSIS — R55 Syncope and collapse: Secondary | ICD-10-CM | POA: Diagnosis not present

## 2017-08-31 DIAGNOSIS — Y998 Other external cause status: Secondary | ICD-10-CM | POA: Insufficient documentation

## 2017-08-31 DIAGNOSIS — Z5321 Procedure and treatment not carried out due to patient leaving prior to being seen by health care provider: Secondary | ICD-10-CM | POA: Insufficient documentation

## 2017-08-31 DIAGNOSIS — S0083XA Contusion of other part of head, initial encounter: Secondary | ICD-10-CM

## 2017-08-31 DIAGNOSIS — S064X0A Epidural hemorrhage without loss of consciousness, initial encounter: Secondary | ICD-10-CM | POA: Diagnosis not present

## 2017-08-31 DIAGNOSIS — Y92013 Bedroom of single-family (private) house as the place of occurrence of the external cause: Secondary | ICD-10-CM | POA: Insufficient documentation

## 2017-08-31 DIAGNOSIS — W06XXXA Fall from bed, initial encounter: Secondary | ICD-10-CM | POA: Insufficient documentation

## 2017-08-31 HISTORY — DX: Dizziness and giddiness: R42

## 2017-08-31 NOTE — ED Triage Notes (Signed)
EMS came out to home and pt refused EMS transportation.

## 2017-08-31 NOTE — ED Triage Notes (Signed)
Pt is also on a halter monitor for recurrent dizziness

## 2017-08-31 NOTE — ED Triage Notes (Signed)
Pt got out of bed without assistance and fell.  Hit head on floor, denies LOC.  Pt with vertigo for few weeks per pt. Pt has holter monitor on.

## 2017-08-31 NOTE — ED Notes (Signed)
Pt family states that they do not want to wait any longer, unable to convince pt and family to stay

## 2017-08-31 NOTE — ED Triage Notes (Signed)
Pt arrived from home with reports of dizziness and hematoma to the back of her head after she fell in the bathroom today. Per daughter the patient was nauseated unable to recall happened after the fall for about 20  minutes after. Family called [facility] and pt was checkout by EMS but was taken here POV.

## 2017-09-01 ENCOUNTER — Other Ambulatory Visit (HOSPITAL_COMMUNITY): Payer: Self-pay | Admitting: Pulmonary Disease

## 2017-09-01 ENCOUNTER — Ambulatory Visit (HOSPITAL_COMMUNITY)
Admission: RE | Admit: 2017-09-01 | Discharge: 2017-09-01 | Disposition: A | Discharge status: Home / Self Care | Payer: Medicare HMO | Source: Ambulatory Visit | Attending: Pulmonary Disease | Admitting: Pulmonary Disease

## 2017-09-01 DIAGNOSIS — G319 Degenerative disease of nervous system, unspecified: Secondary | ICD-10-CM | POA: Insufficient documentation

## 2017-09-01 DIAGNOSIS — S060X9A Concussion with loss of consciousness of unspecified duration, initial encounter: Secondary | ICD-10-CM | POA: Insufficient documentation

## 2017-09-01 DIAGNOSIS — W19XXXA Unspecified fall, initial encounter: Secondary | ICD-10-CM | POA: Diagnosis not present

## 2017-09-01 DIAGNOSIS — R930 Abnormal findings on diagnostic imaging of skull and head, not elsewhere classified: Secondary | ICD-10-CM | POA: Insufficient documentation

## 2017-09-01 DIAGNOSIS — I10 Essential (primary) hypertension: Secondary | ICD-10-CM | POA: Diagnosis not present

## 2017-09-01 DIAGNOSIS — R296 Repeated falls: Secondary | ICD-10-CM | POA: Diagnosis not present

## 2017-09-01 DIAGNOSIS — R51 Headache: Secondary | ICD-10-CM | POA: Diagnosis not present

## 2017-09-01 DIAGNOSIS — G8190 Hemiplegia, unspecified affecting unspecified side: Secondary | ICD-10-CM | POA: Diagnosis not present

## 2017-09-01 DIAGNOSIS — R42 Dizziness and giddiness: Secondary | ICD-10-CM | POA: Diagnosis not present

## 2017-09-01 DIAGNOSIS — G9389 Other specified disorders of brain: Secondary | ICD-10-CM | POA: Diagnosis not present

## 2017-09-01 DIAGNOSIS — Z23 Encounter for immunization: Secondary | ICD-10-CM | POA: Diagnosis not present

## 2017-09-06 DIAGNOSIS — R55 Syncope and collapse: Secondary | ICD-10-CM | POA: Diagnosis not present

## 2017-09-06 DIAGNOSIS — I110 Hypertensive heart disease with heart failure: Secondary | ICD-10-CM | POA: Diagnosis not present

## 2017-09-06 DIAGNOSIS — R296 Repeated falls: Secondary | ICD-10-CM | POA: Diagnosis not present

## 2017-09-06 DIAGNOSIS — I5032 Chronic diastolic (congestive) heart failure: Secondary | ICD-10-CM | POA: Diagnosis not present

## 2017-09-06 DIAGNOSIS — I69354 Hemiplegia and hemiparesis following cerebral infarction affecting left non-dominant side: Secondary | ICD-10-CM | POA: Diagnosis not present

## 2017-09-06 DIAGNOSIS — I2510 Atherosclerotic heart disease of native coronary artery without angina pectoris: Secondary | ICD-10-CM | POA: Diagnosis not present

## 2017-09-06 DIAGNOSIS — M797 Fibromyalgia: Secondary | ICD-10-CM | POA: Diagnosis not present

## 2017-09-06 DIAGNOSIS — G8929 Other chronic pain: Secondary | ICD-10-CM | POA: Diagnosis not present

## 2017-09-06 DIAGNOSIS — J449 Chronic obstructive pulmonary disease, unspecified: Secondary | ICD-10-CM | POA: Diagnosis not present

## 2017-09-07 DIAGNOSIS — M797 Fibromyalgia: Secondary | ICD-10-CM | POA: Diagnosis not present

## 2017-09-07 DIAGNOSIS — R296 Repeated falls: Secondary | ICD-10-CM | POA: Diagnosis not present

## 2017-09-07 DIAGNOSIS — I110 Hypertensive heart disease with heart failure: Secondary | ICD-10-CM | POA: Diagnosis not present

## 2017-09-07 DIAGNOSIS — I69354 Hemiplegia and hemiparesis following cerebral infarction affecting left non-dominant side: Secondary | ICD-10-CM | POA: Diagnosis not present

## 2017-09-07 DIAGNOSIS — J449 Chronic obstructive pulmonary disease, unspecified: Secondary | ICD-10-CM | POA: Diagnosis not present

## 2017-09-07 DIAGNOSIS — I2510 Atherosclerotic heart disease of native coronary artery without angina pectoris: Secondary | ICD-10-CM | POA: Diagnosis not present

## 2017-09-07 DIAGNOSIS — R55 Syncope and collapse: Secondary | ICD-10-CM | POA: Diagnosis not present

## 2017-09-07 DIAGNOSIS — I5032 Chronic diastolic (congestive) heart failure: Secondary | ICD-10-CM | POA: Diagnosis not present

## 2017-09-07 DIAGNOSIS — G8929 Other chronic pain: Secondary | ICD-10-CM | POA: Diagnosis not present

## 2017-09-09 DIAGNOSIS — G8929 Other chronic pain: Secondary | ICD-10-CM | POA: Diagnosis not present

## 2017-09-09 DIAGNOSIS — J449 Chronic obstructive pulmonary disease, unspecified: Secondary | ICD-10-CM | POA: Diagnosis not present

## 2017-09-09 DIAGNOSIS — I110 Hypertensive heart disease with heart failure: Secondary | ICD-10-CM | POA: Diagnosis not present

## 2017-09-09 DIAGNOSIS — I2510 Atherosclerotic heart disease of native coronary artery without angina pectoris: Secondary | ICD-10-CM | POA: Diagnosis not present

## 2017-09-09 DIAGNOSIS — I5032 Chronic diastolic (congestive) heart failure: Secondary | ICD-10-CM | POA: Diagnosis not present

## 2017-09-09 DIAGNOSIS — R55 Syncope and collapse: Secondary | ICD-10-CM | POA: Diagnosis not present

## 2017-09-09 DIAGNOSIS — M797 Fibromyalgia: Secondary | ICD-10-CM | POA: Diagnosis not present

## 2017-09-09 DIAGNOSIS — R296 Repeated falls: Secondary | ICD-10-CM | POA: Diagnosis not present

## 2017-09-09 DIAGNOSIS — I69354 Hemiplegia and hemiparesis following cerebral infarction affecting left non-dominant side: Secondary | ICD-10-CM | POA: Diagnosis not present

## 2017-09-15 DIAGNOSIS — J449 Chronic obstructive pulmonary disease, unspecified: Secondary | ICD-10-CM | POA: Diagnosis not present

## 2017-09-15 DIAGNOSIS — I110 Hypertensive heart disease with heart failure: Secondary | ICD-10-CM | POA: Diagnosis not present

## 2017-09-15 DIAGNOSIS — M797 Fibromyalgia: Secondary | ICD-10-CM | POA: Diagnosis not present

## 2017-09-15 DIAGNOSIS — I2510 Atherosclerotic heart disease of native coronary artery without angina pectoris: Secondary | ICD-10-CM | POA: Diagnosis not present

## 2017-09-15 DIAGNOSIS — G8929 Other chronic pain: Secondary | ICD-10-CM | POA: Diagnosis not present

## 2017-09-15 DIAGNOSIS — I5032 Chronic diastolic (congestive) heart failure: Secondary | ICD-10-CM | POA: Diagnosis not present

## 2017-09-15 DIAGNOSIS — R55 Syncope and collapse: Secondary | ICD-10-CM | POA: Diagnosis not present

## 2017-09-15 DIAGNOSIS — I69354 Hemiplegia and hemiparesis following cerebral infarction affecting left non-dominant side: Secondary | ICD-10-CM | POA: Diagnosis not present

## 2017-09-15 DIAGNOSIS — R296 Repeated falls: Secondary | ICD-10-CM | POA: Diagnosis not present

## 2017-09-17 DIAGNOSIS — I5032 Chronic diastolic (congestive) heart failure: Secondary | ICD-10-CM | POA: Diagnosis not present

## 2017-09-17 DIAGNOSIS — I69354 Hemiplegia and hemiparesis following cerebral infarction affecting left non-dominant side: Secondary | ICD-10-CM | POA: Diagnosis not present

## 2017-09-17 DIAGNOSIS — R55 Syncope and collapse: Secondary | ICD-10-CM | POA: Diagnosis not present

## 2017-09-17 DIAGNOSIS — I110 Hypertensive heart disease with heart failure: Secondary | ICD-10-CM | POA: Diagnosis not present

## 2017-09-17 DIAGNOSIS — R296 Repeated falls: Secondary | ICD-10-CM | POA: Diagnosis not present

## 2017-09-17 DIAGNOSIS — G8929 Other chronic pain: Secondary | ICD-10-CM | POA: Diagnosis not present

## 2017-09-17 DIAGNOSIS — J449 Chronic obstructive pulmonary disease, unspecified: Secondary | ICD-10-CM | POA: Diagnosis not present

## 2017-09-17 DIAGNOSIS — M797 Fibromyalgia: Secondary | ICD-10-CM | POA: Diagnosis not present

## 2017-09-17 DIAGNOSIS — I2510 Atherosclerotic heart disease of native coronary artery without angina pectoris: Secondary | ICD-10-CM | POA: Diagnosis not present

## 2017-09-20 DIAGNOSIS — J449 Chronic obstructive pulmonary disease, unspecified: Secondary | ICD-10-CM | POA: Diagnosis not present

## 2017-09-20 DIAGNOSIS — M797 Fibromyalgia: Secondary | ICD-10-CM | POA: Diagnosis not present

## 2017-09-20 DIAGNOSIS — I110 Hypertensive heart disease with heart failure: Secondary | ICD-10-CM | POA: Diagnosis not present

## 2017-09-20 DIAGNOSIS — G8929 Other chronic pain: Secondary | ICD-10-CM | POA: Diagnosis not present

## 2017-09-20 DIAGNOSIS — I69354 Hemiplegia and hemiparesis following cerebral infarction affecting left non-dominant side: Secondary | ICD-10-CM | POA: Diagnosis not present

## 2017-09-20 DIAGNOSIS — I2510 Atherosclerotic heart disease of native coronary artery without angina pectoris: Secondary | ICD-10-CM | POA: Diagnosis not present

## 2017-09-20 DIAGNOSIS — R55 Syncope and collapse: Secondary | ICD-10-CM | POA: Diagnosis not present

## 2017-09-20 DIAGNOSIS — I5032 Chronic diastolic (congestive) heart failure: Secondary | ICD-10-CM | POA: Diagnosis not present

## 2017-09-20 DIAGNOSIS — R296 Repeated falls: Secondary | ICD-10-CM | POA: Diagnosis not present

## 2017-09-21 DIAGNOSIS — I110 Hypertensive heart disease with heart failure: Secondary | ICD-10-CM | POA: Diagnosis not present

## 2017-09-21 DIAGNOSIS — G8929 Other chronic pain: Secondary | ICD-10-CM | POA: Diagnosis not present

## 2017-09-21 DIAGNOSIS — I69354 Hemiplegia and hemiparesis following cerebral infarction affecting left non-dominant side: Secondary | ICD-10-CM | POA: Diagnosis not present

## 2017-09-21 DIAGNOSIS — M797 Fibromyalgia: Secondary | ICD-10-CM | POA: Diagnosis not present

## 2017-09-21 DIAGNOSIS — R296 Repeated falls: Secondary | ICD-10-CM | POA: Diagnosis not present

## 2017-09-21 DIAGNOSIS — R55 Syncope and collapse: Secondary | ICD-10-CM | POA: Diagnosis not present

## 2017-09-21 DIAGNOSIS — J449 Chronic obstructive pulmonary disease, unspecified: Secondary | ICD-10-CM | POA: Diagnosis not present

## 2017-09-21 DIAGNOSIS — I5032 Chronic diastolic (congestive) heart failure: Secondary | ICD-10-CM | POA: Diagnosis not present

## 2017-09-21 DIAGNOSIS — I2510 Atherosclerotic heart disease of native coronary artery without angina pectoris: Secondary | ICD-10-CM | POA: Diagnosis not present

## 2017-09-22 DIAGNOSIS — J449 Chronic obstructive pulmonary disease, unspecified: Secondary | ICD-10-CM | POA: Diagnosis not present

## 2017-09-22 DIAGNOSIS — I110 Hypertensive heart disease with heart failure: Secondary | ICD-10-CM | POA: Diagnosis not present

## 2017-09-22 DIAGNOSIS — I5032 Chronic diastolic (congestive) heart failure: Secondary | ICD-10-CM | POA: Diagnosis not present

## 2017-09-22 DIAGNOSIS — R296 Repeated falls: Secondary | ICD-10-CM | POA: Diagnosis not present

## 2017-09-22 DIAGNOSIS — I2510 Atherosclerotic heart disease of native coronary artery without angina pectoris: Secondary | ICD-10-CM | POA: Diagnosis not present

## 2017-09-22 DIAGNOSIS — I69354 Hemiplegia and hemiparesis following cerebral infarction affecting left non-dominant side: Secondary | ICD-10-CM | POA: Diagnosis not present

## 2017-09-22 DIAGNOSIS — R55 Syncope and collapse: Secondary | ICD-10-CM | POA: Diagnosis not present

## 2017-09-22 DIAGNOSIS — M797 Fibromyalgia: Secondary | ICD-10-CM | POA: Diagnosis not present

## 2017-09-22 DIAGNOSIS — G8929 Other chronic pain: Secondary | ICD-10-CM | POA: Diagnosis not present

## 2017-09-26 DIAGNOSIS — S0083XA Contusion of other part of head, initial encounter: Secondary | ICD-10-CM | POA: Diagnosis not present

## 2017-09-27 DIAGNOSIS — I110 Hypertensive heart disease with heart failure: Secondary | ICD-10-CM | POA: Diagnosis not present

## 2017-09-27 DIAGNOSIS — G8929 Other chronic pain: Secondary | ICD-10-CM | POA: Diagnosis not present

## 2017-09-27 DIAGNOSIS — I5032 Chronic diastolic (congestive) heart failure: Secondary | ICD-10-CM | POA: Diagnosis not present

## 2017-09-27 DIAGNOSIS — I69354 Hemiplegia and hemiparesis following cerebral infarction affecting left non-dominant side: Secondary | ICD-10-CM | POA: Diagnosis not present

## 2017-09-27 DIAGNOSIS — J449 Chronic obstructive pulmonary disease, unspecified: Secondary | ICD-10-CM | POA: Diagnosis not present

## 2017-09-27 DIAGNOSIS — M797 Fibromyalgia: Secondary | ICD-10-CM | POA: Diagnosis not present

## 2017-09-27 DIAGNOSIS — R296 Repeated falls: Secondary | ICD-10-CM | POA: Diagnosis not present

## 2017-09-27 DIAGNOSIS — R55 Syncope and collapse: Secondary | ICD-10-CM | POA: Diagnosis not present

## 2017-09-27 DIAGNOSIS — I2510 Atherosclerotic heart disease of native coronary artery without angina pectoris: Secondary | ICD-10-CM | POA: Diagnosis not present

## 2017-09-29 DIAGNOSIS — R296 Repeated falls: Secondary | ICD-10-CM | POA: Diagnosis not present

## 2017-09-29 DIAGNOSIS — I110 Hypertensive heart disease with heart failure: Secondary | ICD-10-CM | POA: Diagnosis not present

## 2017-09-29 DIAGNOSIS — I69354 Hemiplegia and hemiparesis following cerebral infarction affecting left non-dominant side: Secondary | ICD-10-CM | POA: Diagnosis not present

## 2017-09-29 DIAGNOSIS — J449 Chronic obstructive pulmonary disease, unspecified: Secondary | ICD-10-CM | POA: Diagnosis not present

## 2017-09-29 DIAGNOSIS — M797 Fibromyalgia: Secondary | ICD-10-CM | POA: Diagnosis not present

## 2017-09-29 DIAGNOSIS — R55 Syncope and collapse: Secondary | ICD-10-CM | POA: Diagnosis not present

## 2017-09-29 DIAGNOSIS — G8929 Other chronic pain: Secondary | ICD-10-CM | POA: Diagnosis not present

## 2017-09-29 DIAGNOSIS — I2510 Atherosclerotic heart disease of native coronary artery without angina pectoris: Secondary | ICD-10-CM | POA: Diagnosis not present

## 2017-09-29 DIAGNOSIS — I5032 Chronic diastolic (congestive) heart failure: Secondary | ICD-10-CM | POA: Diagnosis not present

## 2017-09-30 DIAGNOSIS — I5032 Chronic diastolic (congestive) heart failure: Secondary | ICD-10-CM | POA: Diagnosis not present

## 2017-09-30 DIAGNOSIS — M797 Fibromyalgia: Secondary | ICD-10-CM | POA: Diagnosis not present

## 2017-09-30 DIAGNOSIS — J449 Chronic obstructive pulmonary disease, unspecified: Secondary | ICD-10-CM | POA: Diagnosis not present

## 2017-09-30 DIAGNOSIS — I69354 Hemiplegia and hemiparesis following cerebral infarction affecting left non-dominant side: Secondary | ICD-10-CM | POA: Diagnosis not present

## 2017-09-30 DIAGNOSIS — I2510 Atherosclerotic heart disease of native coronary artery without angina pectoris: Secondary | ICD-10-CM | POA: Diagnosis not present

## 2017-09-30 DIAGNOSIS — R55 Syncope and collapse: Secondary | ICD-10-CM | POA: Diagnosis not present

## 2017-09-30 DIAGNOSIS — I110 Hypertensive heart disease with heart failure: Secondary | ICD-10-CM | POA: Diagnosis not present

## 2017-09-30 DIAGNOSIS — R296 Repeated falls: Secondary | ICD-10-CM | POA: Diagnosis not present

## 2017-09-30 DIAGNOSIS — G8929 Other chronic pain: Secondary | ICD-10-CM | POA: Diagnosis not present

## 2017-10-04 DIAGNOSIS — I10 Essential (primary) hypertension: Secondary | ICD-10-CM | POA: Diagnosis not present

## 2017-10-04 DIAGNOSIS — R296 Repeated falls: Secondary | ICD-10-CM | POA: Diagnosis not present

## 2017-10-04 DIAGNOSIS — G8190 Hemiplegia, unspecified affecting unspecified side: Secondary | ICD-10-CM | POA: Diagnosis not present

## 2017-10-04 DIAGNOSIS — J441 Chronic obstructive pulmonary disease with (acute) exacerbation: Secondary | ICD-10-CM | POA: Diagnosis not present

## 2017-10-06 DIAGNOSIS — R55 Syncope and collapse: Secondary | ICD-10-CM | POA: Diagnosis not present

## 2017-10-06 DIAGNOSIS — I110 Hypertensive heart disease with heart failure: Secondary | ICD-10-CM | POA: Diagnosis not present

## 2017-10-06 DIAGNOSIS — J449 Chronic obstructive pulmonary disease, unspecified: Secondary | ICD-10-CM | POA: Diagnosis not present

## 2017-10-06 DIAGNOSIS — I2510 Atherosclerotic heart disease of native coronary artery without angina pectoris: Secondary | ICD-10-CM | POA: Diagnosis not present

## 2017-10-06 DIAGNOSIS — I69354 Hemiplegia and hemiparesis following cerebral infarction affecting left non-dominant side: Secondary | ICD-10-CM | POA: Diagnosis not present

## 2017-10-06 DIAGNOSIS — R296 Repeated falls: Secondary | ICD-10-CM | POA: Diagnosis not present

## 2017-10-06 DIAGNOSIS — I5032 Chronic diastolic (congestive) heart failure: Secondary | ICD-10-CM | POA: Diagnosis not present

## 2017-10-06 DIAGNOSIS — G8929 Other chronic pain: Secondary | ICD-10-CM | POA: Diagnosis not present

## 2017-10-06 DIAGNOSIS — M797 Fibromyalgia: Secondary | ICD-10-CM | POA: Diagnosis not present

## 2017-10-08 DIAGNOSIS — G8929 Other chronic pain: Secondary | ICD-10-CM | POA: Diagnosis not present

## 2017-10-08 DIAGNOSIS — R55 Syncope and collapse: Secondary | ICD-10-CM | POA: Diagnosis not present

## 2017-10-08 DIAGNOSIS — I2510 Atherosclerotic heart disease of native coronary artery without angina pectoris: Secondary | ICD-10-CM | POA: Diagnosis not present

## 2017-10-08 DIAGNOSIS — I69354 Hemiplegia and hemiparesis following cerebral infarction affecting left non-dominant side: Secondary | ICD-10-CM | POA: Diagnosis not present

## 2017-10-08 DIAGNOSIS — I110 Hypertensive heart disease with heart failure: Secondary | ICD-10-CM | POA: Diagnosis not present

## 2017-10-08 DIAGNOSIS — I5032 Chronic diastolic (congestive) heart failure: Secondary | ICD-10-CM | POA: Diagnosis not present

## 2017-10-08 DIAGNOSIS — J449 Chronic obstructive pulmonary disease, unspecified: Secondary | ICD-10-CM | POA: Diagnosis not present

## 2017-10-08 DIAGNOSIS — M797 Fibromyalgia: Secondary | ICD-10-CM | POA: Diagnosis not present

## 2017-10-08 DIAGNOSIS — R296 Repeated falls: Secondary | ICD-10-CM | POA: Diagnosis not present

## 2017-10-12 DIAGNOSIS — R55 Syncope and collapse: Secondary | ICD-10-CM | POA: Diagnosis not present

## 2017-10-12 DIAGNOSIS — R296 Repeated falls: Secondary | ICD-10-CM | POA: Diagnosis not present

## 2017-10-12 DIAGNOSIS — G8929 Other chronic pain: Secondary | ICD-10-CM | POA: Diagnosis not present

## 2017-10-12 DIAGNOSIS — J449 Chronic obstructive pulmonary disease, unspecified: Secondary | ICD-10-CM | POA: Diagnosis not present

## 2017-10-12 DIAGNOSIS — I110 Hypertensive heart disease with heart failure: Secondary | ICD-10-CM | POA: Diagnosis not present

## 2017-10-12 DIAGNOSIS — I2510 Atherosclerotic heart disease of native coronary artery without angina pectoris: Secondary | ICD-10-CM | POA: Diagnosis not present

## 2017-10-12 DIAGNOSIS — I5032 Chronic diastolic (congestive) heart failure: Secondary | ICD-10-CM | POA: Diagnosis not present

## 2017-10-12 DIAGNOSIS — I69354 Hemiplegia and hemiparesis following cerebral infarction affecting left non-dominant side: Secondary | ICD-10-CM | POA: Diagnosis not present

## 2017-10-12 DIAGNOSIS — M797 Fibromyalgia: Secondary | ICD-10-CM | POA: Diagnosis not present

## 2017-10-13 DIAGNOSIS — R55 Syncope and collapse: Secondary | ICD-10-CM | POA: Diagnosis not present

## 2017-10-13 DIAGNOSIS — M797 Fibromyalgia: Secondary | ICD-10-CM | POA: Diagnosis not present

## 2017-10-13 DIAGNOSIS — I5032 Chronic diastolic (congestive) heart failure: Secondary | ICD-10-CM | POA: Diagnosis not present

## 2017-10-13 DIAGNOSIS — J449 Chronic obstructive pulmonary disease, unspecified: Secondary | ICD-10-CM | POA: Diagnosis not present

## 2017-10-13 DIAGNOSIS — I2510 Atherosclerotic heart disease of native coronary artery without angina pectoris: Secondary | ICD-10-CM | POA: Diagnosis not present

## 2017-10-13 DIAGNOSIS — G8929 Other chronic pain: Secondary | ICD-10-CM | POA: Diagnosis not present

## 2017-10-13 DIAGNOSIS — I110 Hypertensive heart disease with heart failure: Secondary | ICD-10-CM | POA: Diagnosis not present

## 2017-10-13 DIAGNOSIS — I69354 Hemiplegia and hemiparesis following cerebral infarction affecting left non-dominant side: Secondary | ICD-10-CM | POA: Diagnosis not present

## 2017-10-13 DIAGNOSIS — R296 Repeated falls: Secondary | ICD-10-CM | POA: Diagnosis not present

## 2017-10-14 DIAGNOSIS — I110 Hypertensive heart disease with heart failure: Secondary | ICD-10-CM | POA: Diagnosis not present

## 2017-10-14 DIAGNOSIS — G8929 Other chronic pain: Secondary | ICD-10-CM | POA: Diagnosis not present

## 2017-10-14 DIAGNOSIS — R296 Repeated falls: Secondary | ICD-10-CM | POA: Diagnosis not present

## 2017-10-14 DIAGNOSIS — I5032 Chronic diastolic (congestive) heart failure: Secondary | ICD-10-CM | POA: Diagnosis not present

## 2017-10-14 DIAGNOSIS — R55 Syncope and collapse: Secondary | ICD-10-CM | POA: Diagnosis not present

## 2017-10-14 DIAGNOSIS — I69354 Hemiplegia and hemiparesis following cerebral infarction affecting left non-dominant side: Secondary | ICD-10-CM | POA: Diagnosis not present

## 2017-10-14 DIAGNOSIS — J449 Chronic obstructive pulmonary disease, unspecified: Secondary | ICD-10-CM | POA: Diagnosis not present

## 2017-10-14 DIAGNOSIS — I2510 Atherosclerotic heart disease of native coronary artery without angina pectoris: Secondary | ICD-10-CM | POA: Diagnosis not present

## 2017-10-14 DIAGNOSIS — M797 Fibromyalgia: Secondary | ICD-10-CM | POA: Diagnosis not present

## 2017-10-19 DIAGNOSIS — H353221 Exudative age-related macular degeneration, left eye, with active choroidal neovascularization: Secondary | ICD-10-CM | POA: Diagnosis not present

## 2017-11-22 DIAGNOSIS — I951 Orthostatic hypotension: Secondary | ICD-10-CM | POA: Diagnosis not present

## 2017-11-22 DIAGNOSIS — G8190 Hemiplegia, unspecified affecting unspecified side: Secondary | ICD-10-CM | POA: Diagnosis not present

## 2017-11-22 DIAGNOSIS — I10 Essential (primary) hypertension: Secondary | ICD-10-CM | POA: Diagnosis not present

## 2017-11-22 DIAGNOSIS — L509 Urticaria, unspecified: Secondary | ICD-10-CM | POA: Diagnosis not present

## 2017-11-22 DIAGNOSIS — C50011 Malignant neoplasm of nipple and areola, right female breast: Secondary | ICD-10-CM | POA: Diagnosis not present

## 2017-12-02 DIAGNOSIS — N3281 Overactive bladder: Secondary | ICD-10-CM | POA: Diagnosis not present

## 2017-12-02 DIAGNOSIS — G8190 Hemiplegia, unspecified affecting unspecified side: Secondary | ICD-10-CM | POA: Diagnosis not present

## 2017-12-02 DIAGNOSIS — I10 Essential (primary) hypertension: Secondary | ICD-10-CM | POA: Diagnosis not present

## 2017-12-02 DIAGNOSIS — J449 Chronic obstructive pulmonary disease, unspecified: Secondary | ICD-10-CM | POA: Diagnosis not present

## 2018-01-17 DIAGNOSIS — H353221 Exudative age-related macular degeneration, left eye, with active choroidal neovascularization: Secondary | ICD-10-CM | POA: Diagnosis not present

## 2018-02-01 DIAGNOSIS — J449 Chronic obstructive pulmonary disease, unspecified: Secondary | ICD-10-CM | POA: Diagnosis not present

## 2018-02-01 DIAGNOSIS — M1990 Unspecified osteoarthritis, unspecified site: Secondary | ICD-10-CM | POA: Diagnosis not present

## 2018-02-01 DIAGNOSIS — I10 Essential (primary) hypertension: Secondary | ICD-10-CM | POA: Diagnosis not present

## 2018-03-04 DIAGNOSIS — H6123 Impacted cerumen, bilateral: Secondary | ICD-10-CM | POA: Diagnosis not present

## 2018-03-22 DIAGNOSIS — M1990 Unspecified osteoarthritis, unspecified site: Secondary | ICD-10-CM | POA: Diagnosis not present

## 2018-03-22 DIAGNOSIS — I10 Essential (primary) hypertension: Secondary | ICD-10-CM | POA: Diagnosis not present

## 2018-03-22 DIAGNOSIS — G8191 Hemiplegia, unspecified affecting right dominant side: Secondary | ICD-10-CM | POA: Diagnosis not present

## 2018-03-22 DIAGNOSIS — I509 Heart failure, unspecified: Secondary | ICD-10-CM | POA: Diagnosis not present

## 2018-03-31 DIAGNOSIS — K5090 Crohn's disease, unspecified, without complications: Secondary | ICD-10-CM | POA: Diagnosis not present

## 2018-03-31 DIAGNOSIS — F329 Major depressive disorder, single episode, unspecified: Secondary | ICD-10-CM | POA: Diagnosis not present

## 2018-03-31 DIAGNOSIS — I951 Orthostatic hypotension: Secondary | ICD-10-CM | POA: Diagnosis not present

## 2018-03-31 DIAGNOSIS — I509 Heart failure, unspecified: Secondary | ICD-10-CM | POA: Diagnosis not present

## 2018-03-31 DIAGNOSIS — S50912A Unspecified superficial injury of left forearm, initial encounter: Secondary | ICD-10-CM | POA: Diagnosis not present

## 2018-03-31 DIAGNOSIS — J449 Chronic obstructive pulmonary disease, unspecified: Secondary | ICD-10-CM | POA: Diagnosis not present

## 2018-03-31 DIAGNOSIS — I69351 Hemiplegia and hemiparesis following cerebral infarction affecting right dominant side: Secondary | ICD-10-CM | POA: Diagnosis not present

## 2018-03-31 DIAGNOSIS — K589 Irritable bowel syndrome without diarrhea: Secondary | ICD-10-CM | POA: Diagnosis not present

## 2018-03-31 DIAGNOSIS — I110 Hypertensive heart disease with heart failure: Secondary | ICD-10-CM | POA: Diagnosis not present

## 2018-04-04 DIAGNOSIS — S50912A Unspecified superficial injury of left forearm, initial encounter: Secondary | ICD-10-CM | POA: Diagnosis not present

## 2018-04-04 DIAGNOSIS — I509 Heart failure, unspecified: Secondary | ICD-10-CM | POA: Diagnosis not present

## 2018-04-04 DIAGNOSIS — F329 Major depressive disorder, single episode, unspecified: Secondary | ICD-10-CM | POA: Diagnosis not present

## 2018-04-04 DIAGNOSIS — I951 Orthostatic hypotension: Secondary | ICD-10-CM | POA: Diagnosis not present

## 2018-04-04 DIAGNOSIS — J449 Chronic obstructive pulmonary disease, unspecified: Secondary | ICD-10-CM | POA: Diagnosis not present

## 2018-04-04 DIAGNOSIS — I69351 Hemiplegia and hemiparesis following cerebral infarction affecting right dominant side: Secondary | ICD-10-CM | POA: Diagnosis not present

## 2018-04-04 DIAGNOSIS — K589 Irritable bowel syndrome without diarrhea: Secondary | ICD-10-CM | POA: Diagnosis not present

## 2018-04-04 DIAGNOSIS — I110 Hypertensive heart disease with heart failure: Secondary | ICD-10-CM | POA: Diagnosis not present

## 2018-04-04 DIAGNOSIS — K5090 Crohn's disease, unspecified, without complications: Secondary | ICD-10-CM | POA: Diagnosis not present

## 2018-04-05 DIAGNOSIS — I69351 Hemiplegia and hemiparesis following cerebral infarction affecting right dominant side: Secondary | ICD-10-CM | POA: Diagnosis not present

## 2018-04-05 DIAGNOSIS — K589 Irritable bowel syndrome without diarrhea: Secondary | ICD-10-CM | POA: Diagnosis not present

## 2018-04-05 DIAGNOSIS — K5090 Crohn's disease, unspecified, without complications: Secondary | ICD-10-CM | POA: Diagnosis not present

## 2018-04-05 DIAGNOSIS — I951 Orthostatic hypotension: Secondary | ICD-10-CM | POA: Diagnosis not present

## 2018-04-05 DIAGNOSIS — I509 Heart failure, unspecified: Secondary | ICD-10-CM | POA: Diagnosis not present

## 2018-04-05 DIAGNOSIS — I110 Hypertensive heart disease with heart failure: Secondary | ICD-10-CM | POA: Diagnosis not present

## 2018-04-05 DIAGNOSIS — F329 Major depressive disorder, single episode, unspecified: Secondary | ICD-10-CM | POA: Diagnosis not present

## 2018-04-05 DIAGNOSIS — S50912A Unspecified superficial injury of left forearm, initial encounter: Secondary | ICD-10-CM | POA: Diagnosis not present

## 2018-04-05 DIAGNOSIS — J449 Chronic obstructive pulmonary disease, unspecified: Secondary | ICD-10-CM | POA: Diagnosis not present

## 2018-04-07 DIAGNOSIS — J449 Chronic obstructive pulmonary disease, unspecified: Secondary | ICD-10-CM | POA: Diagnosis not present

## 2018-04-07 DIAGNOSIS — I951 Orthostatic hypotension: Secondary | ICD-10-CM | POA: Diagnosis not present

## 2018-04-07 DIAGNOSIS — S50912A Unspecified superficial injury of left forearm, initial encounter: Secondary | ICD-10-CM | POA: Diagnosis not present

## 2018-04-07 DIAGNOSIS — F329 Major depressive disorder, single episode, unspecified: Secondary | ICD-10-CM | POA: Diagnosis not present

## 2018-04-07 DIAGNOSIS — I110 Hypertensive heart disease with heart failure: Secondary | ICD-10-CM | POA: Diagnosis not present

## 2018-04-07 DIAGNOSIS — K589 Irritable bowel syndrome without diarrhea: Secondary | ICD-10-CM | POA: Diagnosis not present

## 2018-04-07 DIAGNOSIS — I69351 Hemiplegia and hemiparesis following cerebral infarction affecting right dominant side: Secondary | ICD-10-CM | POA: Diagnosis not present

## 2018-04-07 DIAGNOSIS — K5090 Crohn's disease, unspecified, without complications: Secondary | ICD-10-CM | POA: Diagnosis not present

## 2018-04-07 DIAGNOSIS — I509 Heart failure, unspecified: Secondary | ICD-10-CM | POA: Diagnosis not present

## 2018-04-13 DIAGNOSIS — S50912A Unspecified superficial injury of left forearm, initial encounter: Secondary | ICD-10-CM | POA: Diagnosis not present

## 2018-04-13 DIAGNOSIS — I951 Orthostatic hypotension: Secondary | ICD-10-CM | POA: Diagnosis not present

## 2018-04-13 DIAGNOSIS — I69351 Hemiplegia and hemiparesis following cerebral infarction affecting right dominant side: Secondary | ICD-10-CM | POA: Diagnosis not present

## 2018-04-13 DIAGNOSIS — K5090 Crohn's disease, unspecified, without complications: Secondary | ICD-10-CM | POA: Diagnosis not present

## 2018-04-13 DIAGNOSIS — F329 Major depressive disorder, single episode, unspecified: Secondary | ICD-10-CM | POA: Diagnosis not present

## 2018-04-13 DIAGNOSIS — I110 Hypertensive heart disease with heart failure: Secondary | ICD-10-CM | POA: Diagnosis not present

## 2018-04-13 DIAGNOSIS — I509 Heart failure, unspecified: Secondary | ICD-10-CM | POA: Diagnosis not present

## 2018-04-13 DIAGNOSIS — J449 Chronic obstructive pulmonary disease, unspecified: Secondary | ICD-10-CM | POA: Diagnosis not present

## 2018-04-13 DIAGNOSIS — K589 Irritable bowel syndrome without diarrhea: Secondary | ICD-10-CM | POA: Diagnosis not present

## 2018-04-15 DIAGNOSIS — S50912A Unspecified superficial injury of left forearm, initial encounter: Secondary | ICD-10-CM | POA: Diagnosis not present

## 2018-04-15 DIAGNOSIS — I509 Heart failure, unspecified: Secondary | ICD-10-CM | POA: Diagnosis not present

## 2018-04-15 DIAGNOSIS — J449 Chronic obstructive pulmonary disease, unspecified: Secondary | ICD-10-CM | POA: Diagnosis not present

## 2018-04-15 DIAGNOSIS — I951 Orthostatic hypotension: Secondary | ICD-10-CM | POA: Diagnosis not present

## 2018-04-15 DIAGNOSIS — K589 Irritable bowel syndrome without diarrhea: Secondary | ICD-10-CM | POA: Diagnosis not present

## 2018-04-15 DIAGNOSIS — I110 Hypertensive heart disease with heart failure: Secondary | ICD-10-CM | POA: Diagnosis not present

## 2018-04-15 DIAGNOSIS — F329 Major depressive disorder, single episode, unspecified: Secondary | ICD-10-CM | POA: Diagnosis not present

## 2018-04-15 DIAGNOSIS — I69351 Hemiplegia and hemiparesis following cerebral infarction affecting right dominant side: Secondary | ICD-10-CM | POA: Diagnosis not present

## 2018-04-15 DIAGNOSIS — K5090 Crohn's disease, unspecified, without complications: Secondary | ICD-10-CM | POA: Diagnosis not present

## 2018-04-19 DIAGNOSIS — K589 Irritable bowel syndrome without diarrhea: Secondary | ICD-10-CM | POA: Diagnosis not present

## 2018-04-19 DIAGNOSIS — S50912A Unspecified superficial injury of left forearm, initial encounter: Secondary | ICD-10-CM | POA: Diagnosis not present

## 2018-04-19 DIAGNOSIS — K5090 Crohn's disease, unspecified, without complications: Secondary | ICD-10-CM | POA: Diagnosis not present

## 2018-04-19 DIAGNOSIS — I69351 Hemiplegia and hemiparesis following cerebral infarction affecting right dominant side: Secondary | ICD-10-CM | POA: Diagnosis not present

## 2018-04-19 DIAGNOSIS — F329 Major depressive disorder, single episode, unspecified: Secondary | ICD-10-CM | POA: Diagnosis not present

## 2018-04-19 DIAGNOSIS — I509 Heart failure, unspecified: Secondary | ICD-10-CM | POA: Diagnosis not present

## 2018-04-19 DIAGNOSIS — J449 Chronic obstructive pulmonary disease, unspecified: Secondary | ICD-10-CM | POA: Diagnosis not present

## 2018-04-19 DIAGNOSIS — I110 Hypertensive heart disease with heart failure: Secondary | ICD-10-CM | POA: Diagnosis not present

## 2018-04-19 DIAGNOSIS — I951 Orthostatic hypotension: Secondary | ICD-10-CM | POA: Diagnosis not present

## 2018-04-20 DIAGNOSIS — F329 Major depressive disorder, single episode, unspecified: Secondary | ICD-10-CM | POA: Diagnosis not present

## 2018-04-20 DIAGNOSIS — K589 Irritable bowel syndrome without diarrhea: Secondary | ICD-10-CM | POA: Diagnosis not present

## 2018-04-20 DIAGNOSIS — S50912A Unspecified superficial injury of left forearm, initial encounter: Secondary | ICD-10-CM | POA: Diagnosis not present

## 2018-04-20 DIAGNOSIS — I509 Heart failure, unspecified: Secondary | ICD-10-CM | POA: Diagnosis not present

## 2018-04-20 DIAGNOSIS — J449 Chronic obstructive pulmonary disease, unspecified: Secondary | ICD-10-CM | POA: Diagnosis not present

## 2018-04-20 DIAGNOSIS — K5090 Crohn's disease, unspecified, without complications: Secondary | ICD-10-CM | POA: Diagnosis not present

## 2018-04-20 DIAGNOSIS — I110 Hypertensive heart disease with heart failure: Secondary | ICD-10-CM | POA: Diagnosis not present

## 2018-04-20 DIAGNOSIS — I69351 Hemiplegia and hemiparesis following cerebral infarction affecting right dominant side: Secondary | ICD-10-CM | POA: Diagnosis not present

## 2018-04-20 DIAGNOSIS — I951 Orthostatic hypotension: Secondary | ICD-10-CM | POA: Diagnosis not present

## 2018-04-21 DIAGNOSIS — K589 Irritable bowel syndrome without diarrhea: Secondary | ICD-10-CM | POA: Diagnosis not present

## 2018-04-21 DIAGNOSIS — I509 Heart failure, unspecified: Secondary | ICD-10-CM | POA: Diagnosis not present

## 2018-04-21 DIAGNOSIS — K5090 Crohn's disease, unspecified, without complications: Secondary | ICD-10-CM | POA: Diagnosis not present

## 2018-04-21 DIAGNOSIS — S50912A Unspecified superficial injury of left forearm, initial encounter: Secondary | ICD-10-CM | POA: Diagnosis not present

## 2018-04-21 DIAGNOSIS — I110 Hypertensive heart disease with heart failure: Secondary | ICD-10-CM | POA: Diagnosis not present

## 2018-04-21 DIAGNOSIS — I69351 Hemiplegia and hemiparesis following cerebral infarction affecting right dominant side: Secondary | ICD-10-CM | POA: Diagnosis not present

## 2018-04-21 DIAGNOSIS — F329 Major depressive disorder, single episode, unspecified: Secondary | ICD-10-CM | POA: Diagnosis not present

## 2018-04-21 DIAGNOSIS — I951 Orthostatic hypotension: Secondary | ICD-10-CM | POA: Diagnosis not present

## 2018-04-21 DIAGNOSIS — J449 Chronic obstructive pulmonary disease, unspecified: Secondary | ICD-10-CM | POA: Diagnosis not present

## 2018-04-25 DIAGNOSIS — K5090 Crohn's disease, unspecified, without complications: Secondary | ICD-10-CM | POA: Diagnosis not present

## 2018-04-25 DIAGNOSIS — K589 Irritable bowel syndrome without diarrhea: Secondary | ICD-10-CM | POA: Diagnosis not present

## 2018-04-25 DIAGNOSIS — I110 Hypertensive heart disease with heart failure: Secondary | ICD-10-CM | POA: Diagnosis not present

## 2018-04-25 DIAGNOSIS — J449 Chronic obstructive pulmonary disease, unspecified: Secondary | ICD-10-CM | POA: Diagnosis not present

## 2018-04-25 DIAGNOSIS — I69351 Hemiplegia and hemiparesis following cerebral infarction affecting right dominant side: Secondary | ICD-10-CM | POA: Diagnosis not present

## 2018-04-25 DIAGNOSIS — S50912A Unspecified superficial injury of left forearm, initial encounter: Secondary | ICD-10-CM | POA: Diagnosis not present

## 2018-04-25 DIAGNOSIS — I509 Heart failure, unspecified: Secondary | ICD-10-CM | POA: Diagnosis not present

## 2018-04-25 DIAGNOSIS — F329 Major depressive disorder, single episode, unspecified: Secondary | ICD-10-CM | POA: Diagnosis not present

## 2018-04-25 DIAGNOSIS — I951 Orthostatic hypotension: Secondary | ICD-10-CM | POA: Diagnosis not present

## 2018-04-26 DIAGNOSIS — J449 Chronic obstructive pulmonary disease, unspecified: Secondary | ICD-10-CM | POA: Diagnosis not present

## 2018-04-26 DIAGNOSIS — I110 Hypertensive heart disease with heart failure: Secondary | ICD-10-CM | POA: Diagnosis not present

## 2018-04-26 DIAGNOSIS — K589 Irritable bowel syndrome without diarrhea: Secondary | ICD-10-CM | POA: Diagnosis not present

## 2018-04-26 DIAGNOSIS — F329 Major depressive disorder, single episode, unspecified: Secondary | ICD-10-CM | POA: Diagnosis not present

## 2018-04-26 DIAGNOSIS — I509 Heart failure, unspecified: Secondary | ICD-10-CM | POA: Diagnosis not present

## 2018-04-26 DIAGNOSIS — K5090 Crohn's disease, unspecified, without complications: Secondary | ICD-10-CM | POA: Diagnosis not present

## 2018-04-26 DIAGNOSIS — S50912A Unspecified superficial injury of left forearm, initial encounter: Secondary | ICD-10-CM | POA: Diagnosis not present

## 2018-04-26 DIAGNOSIS — I951 Orthostatic hypotension: Secondary | ICD-10-CM | POA: Diagnosis not present

## 2018-04-26 DIAGNOSIS — I69351 Hemiplegia and hemiparesis following cerebral infarction affecting right dominant side: Secondary | ICD-10-CM | POA: Diagnosis not present

## 2018-04-28 DIAGNOSIS — I951 Orthostatic hypotension: Secondary | ICD-10-CM | POA: Diagnosis not present

## 2018-04-28 DIAGNOSIS — I69351 Hemiplegia and hemiparesis following cerebral infarction affecting right dominant side: Secondary | ICD-10-CM | POA: Diagnosis not present

## 2018-04-28 DIAGNOSIS — I110 Hypertensive heart disease with heart failure: Secondary | ICD-10-CM | POA: Diagnosis not present

## 2018-04-28 DIAGNOSIS — F329 Major depressive disorder, single episode, unspecified: Secondary | ICD-10-CM | POA: Diagnosis not present

## 2018-04-28 DIAGNOSIS — J449 Chronic obstructive pulmonary disease, unspecified: Secondary | ICD-10-CM | POA: Diagnosis not present

## 2018-04-28 DIAGNOSIS — I509 Heart failure, unspecified: Secondary | ICD-10-CM | POA: Diagnosis not present

## 2018-04-28 DIAGNOSIS — S50912A Unspecified superficial injury of left forearm, initial encounter: Secondary | ICD-10-CM | POA: Diagnosis not present

## 2018-04-28 DIAGNOSIS — K5090 Crohn's disease, unspecified, without complications: Secondary | ICD-10-CM | POA: Diagnosis not present

## 2018-04-28 DIAGNOSIS — K589 Irritable bowel syndrome without diarrhea: Secondary | ICD-10-CM | POA: Diagnosis not present

## 2018-05-02 DIAGNOSIS — F329 Major depressive disorder, single episode, unspecified: Secondary | ICD-10-CM | POA: Diagnosis not present

## 2018-05-02 DIAGNOSIS — S50912A Unspecified superficial injury of left forearm, initial encounter: Secondary | ICD-10-CM | POA: Diagnosis not present

## 2018-05-02 DIAGNOSIS — I110 Hypertensive heart disease with heart failure: Secondary | ICD-10-CM | POA: Diagnosis not present

## 2018-05-02 DIAGNOSIS — I69351 Hemiplegia and hemiparesis following cerebral infarction affecting right dominant side: Secondary | ICD-10-CM | POA: Diagnosis not present

## 2018-05-02 DIAGNOSIS — K589 Irritable bowel syndrome without diarrhea: Secondary | ICD-10-CM | POA: Diagnosis not present

## 2018-05-02 DIAGNOSIS — K5090 Crohn's disease, unspecified, without complications: Secondary | ICD-10-CM | POA: Diagnosis not present

## 2018-05-02 DIAGNOSIS — I951 Orthostatic hypotension: Secondary | ICD-10-CM | POA: Diagnosis not present

## 2018-05-02 DIAGNOSIS — I509 Heart failure, unspecified: Secondary | ICD-10-CM | POA: Diagnosis not present

## 2018-05-02 DIAGNOSIS — J449 Chronic obstructive pulmonary disease, unspecified: Secondary | ICD-10-CM | POA: Diagnosis not present

## 2018-05-03 DIAGNOSIS — I509 Heart failure, unspecified: Secondary | ICD-10-CM | POA: Diagnosis not present

## 2018-05-03 DIAGNOSIS — J449 Chronic obstructive pulmonary disease, unspecified: Secondary | ICD-10-CM | POA: Diagnosis not present

## 2018-05-03 DIAGNOSIS — S50912A Unspecified superficial injury of left forearm, initial encounter: Secondary | ICD-10-CM | POA: Diagnosis not present

## 2018-05-03 DIAGNOSIS — I951 Orthostatic hypotension: Secondary | ICD-10-CM | POA: Diagnosis not present

## 2018-05-03 DIAGNOSIS — I110 Hypertensive heart disease with heart failure: Secondary | ICD-10-CM | POA: Diagnosis not present

## 2018-05-03 DIAGNOSIS — K5090 Crohn's disease, unspecified, without complications: Secondary | ICD-10-CM | POA: Diagnosis not present

## 2018-05-03 DIAGNOSIS — I69351 Hemiplegia and hemiparesis following cerebral infarction affecting right dominant side: Secondary | ICD-10-CM | POA: Diagnosis not present

## 2018-05-03 DIAGNOSIS — K589 Irritable bowel syndrome without diarrhea: Secondary | ICD-10-CM | POA: Diagnosis not present

## 2018-05-03 DIAGNOSIS — F329 Major depressive disorder, single episode, unspecified: Secondary | ICD-10-CM | POA: Diagnosis not present

## 2018-05-04 DIAGNOSIS — J449 Chronic obstructive pulmonary disease, unspecified: Secondary | ICD-10-CM | POA: Diagnosis not present

## 2018-05-04 DIAGNOSIS — I509 Heart failure, unspecified: Secondary | ICD-10-CM | POA: Diagnosis not present

## 2018-05-04 DIAGNOSIS — I110 Hypertensive heart disease with heart failure: Secondary | ICD-10-CM | POA: Diagnosis not present

## 2018-05-04 DIAGNOSIS — K5090 Crohn's disease, unspecified, without complications: Secondary | ICD-10-CM | POA: Diagnosis not present

## 2018-05-04 DIAGNOSIS — I951 Orthostatic hypotension: Secondary | ICD-10-CM | POA: Diagnosis not present

## 2018-05-04 DIAGNOSIS — K589 Irritable bowel syndrome without diarrhea: Secondary | ICD-10-CM | POA: Diagnosis not present

## 2018-05-04 DIAGNOSIS — I69351 Hemiplegia and hemiparesis following cerebral infarction affecting right dominant side: Secondary | ICD-10-CM | POA: Diagnosis not present

## 2018-05-04 DIAGNOSIS — F329 Major depressive disorder, single episode, unspecified: Secondary | ICD-10-CM | POA: Diagnosis not present

## 2018-05-04 DIAGNOSIS — S50912A Unspecified superficial injury of left forearm, initial encounter: Secondary | ICD-10-CM | POA: Diagnosis not present

## 2018-05-06 DIAGNOSIS — I951 Orthostatic hypotension: Secondary | ICD-10-CM | POA: Diagnosis not present

## 2018-05-06 DIAGNOSIS — I110 Hypertensive heart disease with heart failure: Secondary | ICD-10-CM | POA: Diagnosis not present

## 2018-05-06 DIAGNOSIS — S50912A Unspecified superficial injury of left forearm, initial encounter: Secondary | ICD-10-CM | POA: Diagnosis not present

## 2018-05-06 DIAGNOSIS — K5090 Crohn's disease, unspecified, without complications: Secondary | ICD-10-CM | POA: Diagnosis not present

## 2018-05-06 DIAGNOSIS — K589 Irritable bowel syndrome without diarrhea: Secondary | ICD-10-CM | POA: Diagnosis not present

## 2018-05-06 DIAGNOSIS — J449 Chronic obstructive pulmonary disease, unspecified: Secondary | ICD-10-CM | POA: Diagnosis not present

## 2018-05-06 DIAGNOSIS — F329 Major depressive disorder, single episode, unspecified: Secondary | ICD-10-CM | POA: Diagnosis not present

## 2018-05-06 DIAGNOSIS — I509 Heart failure, unspecified: Secondary | ICD-10-CM | POA: Diagnosis not present

## 2018-05-06 DIAGNOSIS — I69351 Hemiplegia and hemiparesis following cerebral infarction affecting right dominant side: Secondary | ICD-10-CM | POA: Diagnosis not present

## 2018-05-09 DIAGNOSIS — I951 Orthostatic hypotension: Secondary | ICD-10-CM | POA: Diagnosis not present

## 2018-05-09 DIAGNOSIS — F329 Major depressive disorder, single episode, unspecified: Secondary | ICD-10-CM | POA: Diagnosis not present

## 2018-05-09 DIAGNOSIS — K5090 Crohn's disease, unspecified, without complications: Secondary | ICD-10-CM | POA: Diagnosis not present

## 2018-05-09 DIAGNOSIS — I69351 Hemiplegia and hemiparesis following cerebral infarction affecting right dominant side: Secondary | ICD-10-CM | POA: Diagnosis not present

## 2018-05-09 DIAGNOSIS — S50912A Unspecified superficial injury of left forearm, initial encounter: Secondary | ICD-10-CM | POA: Diagnosis not present

## 2018-05-09 DIAGNOSIS — I509 Heart failure, unspecified: Secondary | ICD-10-CM | POA: Diagnosis not present

## 2018-05-09 DIAGNOSIS — J449 Chronic obstructive pulmonary disease, unspecified: Secondary | ICD-10-CM | POA: Diagnosis not present

## 2018-05-09 DIAGNOSIS — I110 Hypertensive heart disease with heart failure: Secondary | ICD-10-CM | POA: Diagnosis not present

## 2018-05-09 DIAGNOSIS — K589 Irritable bowel syndrome without diarrhea: Secondary | ICD-10-CM | POA: Diagnosis not present

## 2018-05-11 DIAGNOSIS — K589 Irritable bowel syndrome without diarrhea: Secondary | ICD-10-CM | POA: Diagnosis not present

## 2018-05-11 DIAGNOSIS — I69351 Hemiplegia and hemiparesis following cerebral infarction affecting right dominant side: Secondary | ICD-10-CM | POA: Diagnosis not present

## 2018-05-11 DIAGNOSIS — K5090 Crohn's disease, unspecified, without complications: Secondary | ICD-10-CM | POA: Diagnosis not present

## 2018-05-11 DIAGNOSIS — F329 Major depressive disorder, single episode, unspecified: Secondary | ICD-10-CM | POA: Diagnosis not present

## 2018-05-11 DIAGNOSIS — I110 Hypertensive heart disease with heart failure: Secondary | ICD-10-CM | POA: Diagnosis not present

## 2018-05-11 DIAGNOSIS — S50912A Unspecified superficial injury of left forearm, initial encounter: Secondary | ICD-10-CM | POA: Diagnosis not present

## 2018-05-11 DIAGNOSIS — I509 Heart failure, unspecified: Secondary | ICD-10-CM | POA: Diagnosis not present

## 2018-05-11 DIAGNOSIS — J449 Chronic obstructive pulmonary disease, unspecified: Secondary | ICD-10-CM | POA: Diagnosis not present

## 2018-05-11 DIAGNOSIS — I951 Orthostatic hypotension: Secondary | ICD-10-CM | POA: Diagnosis not present

## 2018-05-12 DIAGNOSIS — F329 Major depressive disorder, single episode, unspecified: Secondary | ICD-10-CM | POA: Diagnosis not present

## 2018-05-12 DIAGNOSIS — I110 Hypertensive heart disease with heart failure: Secondary | ICD-10-CM | POA: Diagnosis not present

## 2018-05-12 DIAGNOSIS — I69351 Hemiplegia and hemiparesis following cerebral infarction affecting right dominant side: Secondary | ICD-10-CM | POA: Diagnosis not present

## 2018-05-12 DIAGNOSIS — J449 Chronic obstructive pulmonary disease, unspecified: Secondary | ICD-10-CM | POA: Diagnosis not present

## 2018-05-12 DIAGNOSIS — K5090 Crohn's disease, unspecified, without complications: Secondary | ICD-10-CM | POA: Diagnosis not present

## 2018-05-12 DIAGNOSIS — I951 Orthostatic hypotension: Secondary | ICD-10-CM | POA: Diagnosis not present

## 2018-05-12 DIAGNOSIS — K589 Irritable bowel syndrome without diarrhea: Secondary | ICD-10-CM | POA: Diagnosis not present

## 2018-05-12 DIAGNOSIS — S50912A Unspecified superficial injury of left forearm, initial encounter: Secondary | ICD-10-CM | POA: Diagnosis not present

## 2018-05-12 DIAGNOSIS — I509 Heart failure, unspecified: Secondary | ICD-10-CM | POA: Diagnosis not present

## 2018-05-13 DIAGNOSIS — I951 Orthostatic hypotension: Secondary | ICD-10-CM | POA: Diagnosis not present

## 2018-05-13 DIAGNOSIS — K589 Irritable bowel syndrome without diarrhea: Secondary | ICD-10-CM | POA: Diagnosis not present

## 2018-05-13 DIAGNOSIS — K5090 Crohn's disease, unspecified, without complications: Secondary | ICD-10-CM | POA: Diagnosis not present

## 2018-05-13 DIAGNOSIS — I110 Hypertensive heart disease with heart failure: Secondary | ICD-10-CM | POA: Diagnosis not present

## 2018-05-13 DIAGNOSIS — I69351 Hemiplegia and hemiparesis following cerebral infarction affecting right dominant side: Secondary | ICD-10-CM | POA: Diagnosis not present

## 2018-05-13 DIAGNOSIS — S50912A Unspecified superficial injury of left forearm, initial encounter: Secondary | ICD-10-CM | POA: Diagnosis not present

## 2018-05-13 DIAGNOSIS — I509 Heart failure, unspecified: Secondary | ICD-10-CM | POA: Diagnosis not present

## 2018-05-13 DIAGNOSIS — J449 Chronic obstructive pulmonary disease, unspecified: Secondary | ICD-10-CM | POA: Diagnosis not present

## 2018-05-13 DIAGNOSIS — F329 Major depressive disorder, single episode, unspecified: Secondary | ICD-10-CM | POA: Diagnosis not present

## 2018-05-17 DIAGNOSIS — I69351 Hemiplegia and hemiparesis following cerebral infarction affecting right dominant side: Secondary | ICD-10-CM | POA: Diagnosis not present

## 2018-05-17 DIAGNOSIS — J449 Chronic obstructive pulmonary disease, unspecified: Secondary | ICD-10-CM | POA: Diagnosis not present

## 2018-05-17 DIAGNOSIS — K5090 Crohn's disease, unspecified, without complications: Secondary | ICD-10-CM | POA: Diagnosis not present

## 2018-05-17 DIAGNOSIS — K589 Irritable bowel syndrome without diarrhea: Secondary | ICD-10-CM | POA: Diagnosis not present

## 2018-05-17 DIAGNOSIS — I509 Heart failure, unspecified: Secondary | ICD-10-CM | POA: Diagnosis not present

## 2018-05-17 DIAGNOSIS — F329 Major depressive disorder, single episode, unspecified: Secondary | ICD-10-CM | POA: Diagnosis not present

## 2018-05-17 DIAGNOSIS — I110 Hypertensive heart disease with heart failure: Secondary | ICD-10-CM | POA: Diagnosis not present

## 2018-05-17 DIAGNOSIS — I951 Orthostatic hypotension: Secondary | ICD-10-CM | POA: Diagnosis not present

## 2018-05-17 DIAGNOSIS — S50912A Unspecified superficial injury of left forearm, initial encounter: Secondary | ICD-10-CM | POA: Diagnosis not present

## 2018-05-20 DIAGNOSIS — I110 Hypertensive heart disease with heart failure: Secondary | ICD-10-CM | POA: Diagnosis not present

## 2018-05-20 DIAGNOSIS — K589 Irritable bowel syndrome without diarrhea: Secondary | ICD-10-CM | POA: Diagnosis not present

## 2018-05-20 DIAGNOSIS — I509 Heart failure, unspecified: Secondary | ICD-10-CM | POA: Diagnosis not present

## 2018-05-20 DIAGNOSIS — K5090 Crohn's disease, unspecified, without complications: Secondary | ICD-10-CM | POA: Diagnosis not present

## 2018-05-20 DIAGNOSIS — I951 Orthostatic hypotension: Secondary | ICD-10-CM | POA: Diagnosis not present

## 2018-05-20 DIAGNOSIS — J449 Chronic obstructive pulmonary disease, unspecified: Secondary | ICD-10-CM | POA: Diagnosis not present

## 2018-05-20 DIAGNOSIS — F329 Major depressive disorder, single episode, unspecified: Secondary | ICD-10-CM | POA: Diagnosis not present

## 2018-05-20 DIAGNOSIS — S50912A Unspecified superficial injury of left forearm, initial encounter: Secondary | ICD-10-CM | POA: Diagnosis not present

## 2018-05-20 DIAGNOSIS — I69351 Hemiplegia and hemiparesis following cerebral infarction affecting right dominant side: Secondary | ICD-10-CM | POA: Diagnosis not present

## 2018-06-07 ENCOUNTER — Other Ambulatory Visit (HOSPITAL_COMMUNITY): Payer: Self-pay | Admitting: Pulmonary Disease

## 2018-06-07 ENCOUNTER — Ambulatory Visit (HOSPITAL_COMMUNITY)
Admission: RE | Admit: 2018-06-07 | Discharge: 2018-06-07 | Disposition: A | Discharge status: Home / Self Care | Payer: Medicare HMO | Source: Ambulatory Visit | Attending: Pulmonary Disease | Admitting: Pulmonary Disease

## 2018-06-07 DIAGNOSIS — G8190 Hemiplegia, unspecified affecting unspecified side: Secondary | ICD-10-CM | POA: Diagnosis not present

## 2018-06-07 DIAGNOSIS — I951 Orthostatic hypotension: Secondary | ICD-10-CM | POA: Diagnosis not present

## 2018-06-07 DIAGNOSIS — M19011 Primary osteoarthritis, right shoulder: Secondary | ICD-10-CM | POA: Diagnosis not present

## 2018-06-07 DIAGNOSIS — L500 Allergic urticaria: Secondary | ICD-10-CM | POA: Diagnosis not present

## 2018-06-07 DIAGNOSIS — M25511 Pain in right shoulder: Secondary | ICD-10-CM

## 2018-06-07 DIAGNOSIS — J449 Chronic obstructive pulmonary disease, unspecified: Secondary | ICD-10-CM | POA: Diagnosis not present

## 2018-06-07 DIAGNOSIS — Z0000 Encounter for general adult medical examination without abnormal findings: Secondary | ICD-10-CM | POA: Diagnosis not present

## 2018-06-07 DIAGNOSIS — I509 Heart failure, unspecified: Secondary | ICD-10-CM | POA: Diagnosis not present

## 2018-06-07 DIAGNOSIS — Z23 Encounter for immunization: Secondary | ICD-10-CM | POA: Diagnosis not present

## 2018-06-07 DIAGNOSIS — I10 Essential (primary) hypertension: Secondary | ICD-10-CM | POA: Diagnosis not present

## 2018-06-07 DIAGNOSIS — I6350 Cerebral infarction due to unspecified occlusion or stenosis of unspecified cerebral artery: Secondary | ICD-10-CM | POA: Diagnosis not present

## 2018-06-07 DIAGNOSIS — S4991XA Unspecified injury of right shoulder and upper arm, initial encounter: Secondary | ICD-10-CM | POA: Diagnosis not present

## 2018-06-07 DIAGNOSIS — K5090 Crohn's disease, unspecified, without complications: Secondary | ICD-10-CM | POA: Diagnosis not present

## 2018-08-24 ENCOUNTER — Ambulatory Visit (HOSPITAL_COMMUNITY)
Admission: RE | Admit: 2018-08-24 | Discharge: 2018-08-24 | Disposition: A | Discharge status: Home / Self Care | Payer: Medicare HMO | Source: Ambulatory Visit | Attending: Pulmonary Disease | Admitting: Pulmonary Disease

## 2018-08-24 ENCOUNTER — Other Ambulatory Visit (HOSPITAL_COMMUNITY): Payer: Self-pay | Admitting: Pulmonary Disease

## 2018-08-24 DIAGNOSIS — R918 Other nonspecific abnormal finding of lung field: Secondary | ICD-10-CM | POA: Diagnosis not present

## 2018-08-24 DIAGNOSIS — X58XXXA Exposure to other specified factors, initial encounter: Secondary | ICD-10-CM | POA: Diagnosis not present

## 2018-08-24 DIAGNOSIS — R0789 Other chest pain: Secondary | ICD-10-CM | POA: Insufficient documentation

## 2018-08-24 DIAGNOSIS — F321 Major depressive disorder, single episode, moderate: Secondary | ICD-10-CM | POA: Diagnosis not present

## 2018-08-24 DIAGNOSIS — R079 Chest pain, unspecified: Secondary | ICD-10-CM | POA: Diagnosis not present

## 2018-08-24 DIAGNOSIS — I10 Essential (primary) hypertension: Secondary | ICD-10-CM | POA: Diagnosis not present

## 2018-08-24 DIAGNOSIS — S2232XA Fracture of one rib, left side, initial encounter for closed fracture: Secondary | ICD-10-CM | POA: Insufficient documentation

## 2018-08-24 DIAGNOSIS — G8191 Hemiplegia, unspecified affecting right dominant side: Secondary | ICD-10-CM | POA: Diagnosis not present

## 2018-09-06 DIAGNOSIS — I10 Essential (primary) hypertension: Secondary | ICD-10-CM | POA: Diagnosis not present

## 2018-09-06 DIAGNOSIS — R296 Repeated falls: Secondary | ICD-10-CM | POA: Diagnosis not present

## 2018-09-06 DIAGNOSIS — G8191 Hemiplegia, unspecified affecting right dominant side: Secondary | ICD-10-CM | POA: Diagnosis not present

## 2018-09-06 DIAGNOSIS — I509 Heart failure, unspecified: Secondary | ICD-10-CM | POA: Diagnosis not present

## 2018-11-07 DIAGNOSIS — N3281 Overactive bladder: Secondary | ICD-10-CM | POA: Diagnosis not present

## 2018-11-07 DIAGNOSIS — I10 Essential (primary) hypertension: Secondary | ICD-10-CM | POA: Diagnosis not present

## 2018-11-07 DIAGNOSIS — J449 Chronic obstructive pulmonary disease, unspecified: Secondary | ICD-10-CM | POA: Diagnosis not present

## 2018-11-07 DIAGNOSIS — I509 Heart failure, unspecified: Secondary | ICD-10-CM | POA: Diagnosis not present

## 2019-01-06 DIAGNOSIS — I951 Orthostatic hypotension: Secondary | ICD-10-CM | POA: Diagnosis not present

## 2019-01-06 DIAGNOSIS — G8191 Hemiplegia, unspecified affecting right dominant side: Secondary | ICD-10-CM | POA: Diagnosis not present

## 2019-01-06 DIAGNOSIS — N3281 Overactive bladder: Secondary | ICD-10-CM | POA: Diagnosis not present

## 2019-01-06 DIAGNOSIS — I509 Heart failure, unspecified: Secondary | ICD-10-CM | POA: Diagnosis not present

## 2019-02-09 DIAGNOSIS — F329 Major depressive disorder, single episode, unspecified: Secondary | ICD-10-CM | POA: Diagnosis not present

## 2019-02-09 DIAGNOSIS — F419 Anxiety disorder, unspecified: Secondary | ICD-10-CM | POA: Diagnosis not present

## 2019-03-09 DIAGNOSIS — R21 Rash and other nonspecific skin eruption: Secondary | ICD-10-CM | POA: Diagnosis not present

## 2019-03-09 DIAGNOSIS — G8191 Hemiplegia, unspecified affecting right dominant side: Secondary | ICD-10-CM | POA: Diagnosis not present

## 2019-03-09 DIAGNOSIS — I10 Essential (primary) hypertension: Secondary | ICD-10-CM | POA: Diagnosis not present

## 2019-03-09 DIAGNOSIS — N390 Urinary tract infection, site not specified: Secondary | ICD-10-CM | POA: Diagnosis not present

## 2019-04-04 DIAGNOSIS — B078 Other viral warts: Secondary | ICD-10-CM | POA: Diagnosis not present

## 2019-04-04 DIAGNOSIS — X32XXXA Exposure to sunlight, initial encounter: Secondary | ICD-10-CM | POA: Diagnosis not present

## 2019-04-04 DIAGNOSIS — L821 Other seborrheic keratosis: Secondary | ICD-10-CM | POA: Diagnosis not present

## 2019-04-04 DIAGNOSIS — L570 Actinic keratosis: Secondary | ICD-10-CM | POA: Diagnosis not present

## 2019-04-04 DIAGNOSIS — D225 Melanocytic nevi of trunk: Secondary | ICD-10-CM | POA: Diagnosis not present

## 2019-04-23 DIAGNOSIS — R05 Cough: Secondary | ICD-10-CM | POA: Diagnosis not present

## 2019-04-23 DIAGNOSIS — J449 Chronic obstructive pulmonary disease, unspecified: Secondary | ICD-10-CM | POA: Diagnosis not present

## 2019-04-23 DIAGNOSIS — R4020 Unspecified coma: Secondary | ICD-10-CM | POA: Diagnosis not present

## 2019-04-23 DIAGNOSIS — R55 Syncope and collapse: Secondary | ICD-10-CM | POA: Diagnosis not present

## 2019-04-23 DIAGNOSIS — I959 Hypotension, unspecified: Secondary | ICD-10-CM | POA: Diagnosis not present

## 2019-04-23 DIAGNOSIS — N179 Acute kidney failure, unspecified: Secondary | ICD-10-CM | POA: Diagnosis not present

## 2019-04-23 DIAGNOSIS — I509 Heart failure, unspecified: Secondary | ICD-10-CM | POA: Diagnosis not present

## 2019-04-23 DIAGNOSIS — Z8673 Personal history of transient ischemic attack (TIA), and cerebral infarction without residual deficits: Secondary | ICD-10-CM | POA: Diagnosis not present

## 2019-04-23 DIAGNOSIS — I110 Hypertensive heart disease with heart failure: Secondary | ICD-10-CM | POA: Diagnosis not present

## 2019-04-23 DIAGNOSIS — R0902 Hypoxemia: Secondary | ICD-10-CM | POA: Diagnosis not present

## 2019-04-23 DIAGNOSIS — R944 Abnormal results of kidney function studies: Secondary | ICD-10-CM | POA: Diagnosis not present

## 2019-04-24 DIAGNOSIS — I509 Heart failure, unspecified: Secondary | ICD-10-CM | POA: Diagnosis not present

## 2019-04-24 DIAGNOSIS — I959 Hypotension, unspecified: Secondary | ICD-10-CM | POA: Diagnosis not present

## 2019-04-24 DIAGNOSIS — J449 Chronic obstructive pulmonary disease, unspecified: Secondary | ICD-10-CM | POA: Diagnosis not present

## 2019-04-24 DIAGNOSIS — R0902 Hypoxemia: Secondary | ICD-10-CM | POA: Diagnosis not present

## 2019-04-24 DIAGNOSIS — R05 Cough: Secondary | ICD-10-CM | POA: Diagnosis not present

## 2019-04-24 DIAGNOSIS — N179 Acute kidney failure, unspecified: Secondary | ICD-10-CM | POA: Diagnosis not present

## 2019-04-24 DIAGNOSIS — R55 Syncope and collapse: Secondary | ICD-10-CM | POA: Diagnosis not present

## 2019-04-24 DIAGNOSIS — R944 Abnormal results of kidney function studies: Secondary | ICD-10-CM | POA: Diagnosis not present

## 2019-04-24 DIAGNOSIS — I110 Hypertensive heart disease with heart failure: Secondary | ICD-10-CM | POA: Diagnosis not present

## 2019-04-24 DIAGNOSIS — Z8673 Personal history of transient ischemic attack (TIA), and cerebral infarction without residual deficits: Secondary | ICD-10-CM | POA: Diagnosis not present

## 2019-04-25 DIAGNOSIS — R05 Cough: Secondary | ICD-10-CM | POA: Diagnosis not present

## 2019-04-25 DIAGNOSIS — Z8673 Personal history of transient ischemic attack (TIA), and cerebral infarction without residual deficits: Secondary | ICD-10-CM | POA: Diagnosis not present

## 2019-04-25 DIAGNOSIS — R0902 Hypoxemia: Secondary | ICD-10-CM | POA: Diagnosis not present

## 2019-04-25 DIAGNOSIS — I959 Hypotension, unspecified: Secondary | ICD-10-CM | POA: Diagnosis not present

## 2019-04-25 DIAGNOSIS — I110 Hypertensive heart disease with heart failure: Secondary | ICD-10-CM | POA: Diagnosis not present

## 2019-04-25 DIAGNOSIS — R55 Syncope and collapse: Secondary | ICD-10-CM | POA: Diagnosis not present

## 2019-04-25 DIAGNOSIS — R944 Abnormal results of kidney function studies: Secondary | ICD-10-CM | POA: Diagnosis not present

## 2019-04-25 DIAGNOSIS — I509 Heart failure, unspecified: Secondary | ICD-10-CM | POA: Diagnosis not present

## 2019-04-25 DIAGNOSIS — J449 Chronic obstructive pulmonary disease, unspecified: Secondary | ICD-10-CM | POA: Diagnosis not present

## 2019-04-25 DIAGNOSIS — N179 Acute kidney failure, unspecified: Secondary | ICD-10-CM | POA: Diagnosis not present

## 2019-04-26 DIAGNOSIS — R55 Syncope and collapse: Secondary | ICD-10-CM | POA: Diagnosis not present

## 2019-04-26 DIAGNOSIS — R0902 Hypoxemia: Secondary | ICD-10-CM | POA: Diagnosis not present

## 2019-05-11 DIAGNOSIS — F321 Major depressive disorder, single episode, moderate: Secondary | ICD-10-CM | POA: Diagnosis not present

## 2019-05-11 DIAGNOSIS — I10 Essential (primary) hypertension: Secondary | ICD-10-CM | POA: Diagnosis not present

## 2019-05-11 DIAGNOSIS — M1990 Unspecified osteoarthritis, unspecified site: Secondary | ICD-10-CM | POA: Diagnosis not present

## 2019-05-11 DIAGNOSIS — C8191 Hodgkin lymphoma, unspecified, lymph nodes of head, face, and neck: Secondary | ICD-10-CM | POA: Diagnosis not present

## 2019-07-27 DIAGNOSIS — M1990 Unspecified osteoarthritis, unspecified site: Secondary | ICD-10-CM | POA: Diagnosis not present

## 2019-07-27 DIAGNOSIS — Z23 Encounter for immunization: Secondary | ICD-10-CM | POA: Diagnosis not present

## 2019-07-27 DIAGNOSIS — G8191 Hemiplegia, unspecified affecting right dominant side: Secondary | ICD-10-CM | POA: Diagnosis not present

## 2019-07-27 DIAGNOSIS — N3281 Overactive bladder: Secondary | ICD-10-CM | POA: Diagnosis not present

## 2019-07-27 DIAGNOSIS — I10 Essential (primary) hypertension: Secondary | ICD-10-CM | POA: Diagnosis not present

## 2019-10-17 ENCOUNTER — Other Ambulatory Visit: Payer: Self-pay

## 2019-10-17 ENCOUNTER — Encounter: Payer: Self-pay | Admitting: Family Medicine

## 2019-10-17 ENCOUNTER — Ambulatory Visit (INDEPENDENT_AMBULATORY_CARE_PROVIDER_SITE_OTHER): Payer: Medicare HMO | Admitting: Family Medicine

## 2019-10-17 VITALS — BP 120/80 | HR 87 | Temp 97.4°F | Resp 15 | Ht 63.0 in | Wt 193.1 lb

## 2019-10-17 DIAGNOSIS — M5431 Sciatica, right side: Secondary | ICD-10-CM

## 2019-10-17 DIAGNOSIS — I10 Essential (primary) hypertension: Secondary | ICD-10-CM

## 2019-10-17 DIAGNOSIS — I5189 Other ill-defined heart diseases: Secondary | ICD-10-CM | POA: Diagnosis not present

## 2019-10-17 DIAGNOSIS — Z780 Asymptomatic menopausal state: Secondary | ICD-10-CM

## 2019-10-17 DIAGNOSIS — M069 Rheumatoid arthritis, unspecified: Secondary | ICD-10-CM | POA: Diagnosis not present

## 2019-10-17 DIAGNOSIS — I6930 Unspecified sequelae of cerebral infarction: Secondary | ICD-10-CM | POA: Diagnosis not present

## 2019-10-17 DIAGNOSIS — E785 Hyperlipidemia, unspecified: Secondary | ICD-10-CM | POA: Diagnosis not present

## 2019-10-17 DIAGNOSIS — R296 Repeated falls: Secondary | ICD-10-CM

## 2019-10-17 DIAGNOSIS — J449 Chronic obstructive pulmonary disease, unspecified: Secondary | ICD-10-CM

## 2019-10-17 DIAGNOSIS — R269 Unspecified abnormalities of gait and mobility: Secondary | ICD-10-CM

## 2019-10-17 DIAGNOSIS — N3281 Overactive bladder: Secondary | ICD-10-CM

## 2019-10-17 DIAGNOSIS — Z993 Dependence on wheelchair: Secondary | ICD-10-CM

## 2019-10-17 MED ORDER — UNABLE TO FIND: 0 refills | Status: DC

## 2019-10-17 MED ORDER — METHYLPREDNISOLONE ACETATE 80 MG/ML IJ SUSP
40.0000 mg | Freq: Once | INTRAMUSCULAR | Status: AC
  Administered 2019-10-17: 40 mg via INTRAMUSCULAR

## 2019-10-17 NOTE — Assessment & Plan Note (Signed)
Left side weakness Trouble swallowing

## 2019-10-17 NOTE — Assessment & Plan Note (Signed)
Oxybutynin-higher output at night

## 2019-10-17 NOTE — Assessment & Plan Note (Signed)
Needs update on labs

## 2019-10-17 NOTE — Assessment & Plan Note (Signed)
Controlled, has inhaler- short acting. No wheezing or shortness of breath. O2 sats today in 93% in office

## 2019-10-17 NOTE — Patient Instructions (Addendum)
Happy New Year! May you have a year filled with hope, love, happiness and laughter.  I appreciate the opportunity to provide you with care for your health and wellness. Today we discussed: Establish care  Follow up: 2-3 months  Labs placed today please get fasting.  Referrals for PT in Home, Rheumatology, and Cardiology placed today. I will also order a hospital bed and lift.  Injection today to help with pain  Please continue to practice social distancing to keep you, your family, and our community safe.  If you must go out, please wear a mask and practice good handwashing.  It was a pleasure to see you and I look forward to continuing to work together on your health and well-being. Please do not hesitate to call the office if you need care or have questions about your care.  Have a wonderful day and week. With Gratitude, Cherly Beach, DNP, AGNP-BC

## 2019-10-17 NOTE — Assessment & Plan Note (Addendum)
Last fall was in 2020-several times. No injuries thankfully.  Abnormal gait, wheelchair bound, as had many falls.  Needs transfer lift and hospital bed to help with mobility and prevention of pressure wounds.

## 2019-10-17 NOTE — Progress Notes (Signed)
Subjective:  Patient ID: Kristie Morris, female    DOB: [birth year]  Age: 86 y.o. MRN: [id number]  CC:  Chief Complaint  Patient presents with  . New Patient (Initial Visit)    establish care, has had first Covid vaccine. Goes back 2/5 for second one    HPI  HPI  Ms. Kristie Morris is a 86 year old female patient who presents today to establish care.  Previous patient of Dr. Luan Pulling who is just retired.  Has already received her first vaccine in the Covid virus.  Has a significant history that includes but is not limited to anxiety, asthma, CAD, chronic low back pain, COPD, fibromyalgia, GERD, hypertension, abnormality of gait in wheelchair.  Ms. Kristie Morris has had multiple falls over the last year that her family is not even sure of the count.  She has extreme difficulty with ambulating.  Is been wheelchair-bound for over a year now.  They do not have a lift her hospital bed at this time.  Both would be of use to help keep her from falling, transfer issues, increase mobility in addition to reducing pressure wound risk.  Health maintenance: Has completed her vaccine and is up-to-date on all vaccines at this time and screening test.  Patient is living with her grandson and granddaughter in law Kalman Shan who is actually present with her today. She has 3 cats in the home.  She enjoys mysteries shows.  Eats at least 2 meals a day.  Eats all food groups.  But is not the best at eating adequate amounts or at the best times.  Drinks half caffeine coffee once in the morning and then again in the evening.  She does enjoy water throughout the day and they do do additional added flavors.  Overall here just to establish care and would like to get back in with a rheumatologist, cardiologist, possible help with mobility concerns and refills on medications that are needed.   Today patient denies signs and symptoms of COVID 19 infection including fever, chills, cough, shortness of breath, and headache. Past Medical,  Surgical, Social History, Allergies, and Medications have been Reviewed.   Past Medical History:  Diagnosis Date  . Anxiety   . Arthritis    knee  . Asthma   . CAD (coronary artery disease) cardiologist-  dr Bronson Ing  ([facility] in [place])   2003  Cath with nonobstructive CAD  . Chest pain 08/14/2017  . Chronic headache   . Chronic lower back pain   . COPD (chronic obstructive pulmonary disease) ([place] [facility])   . Diastolic CHF, chronic ([place])   . Duodenal diverticulum   . Edema of left lower extremity   . Fibromyalgia   . Full dentures   . Gait disorder    uses walker  . GERD (gastroesophageal reflux disease)   . Headache disorder 01/17/2015  . Heart murmur   . History of breast cancer   . History of Clostridium difficile   . History of colitis    2001  . History of CVA with residual deficit    06/ 2015  residual left hemiparesis  . History of esophageal dilatation   . History of falling    last fall 06-25-2015  . History of hiatal hernia   . History of small bowel obstruction    04-25-2015  resolved without surgical intervention  . Lesion of bladder   . Low vision, one eye    right eye  . Macular degeneration of both eyes  right eye now low vision/  left eye getting injections currently  . Malignant hypertension   . OAB (overactive bladder)   . Protein-calorie malnutrition, severe ([facility]) 01/10/2015  . SBO (small bowel obstruction) ([facility]) 04/22/2015  . Schatzki's ring   . Sciatica of right side 05/29/2015  . Urge and stress incontinence   . Varicose veins   . Venous insufficiency, peripheral   . Vertigo     Current Meds  Medication Sig  . acetaminophen (TYLENOL) 500 MG tablet Take 1,000 mg daily as needed by mouth for mild pain or moderate pain.  Marland Kitchen albuterol (PROVENTIL HFA;VENTOLIN HFA) 108 (90 BASE) MCG/ACT inhaler Inhale 2 puffs into the lungs every 6 (six) hours as needed for wheezing or shortness of breath.  Marland Kitchen amLODipine (NORVASC) 10 MG tablet Take 10 mg by  mouth daily.  Marland Kitchen aspirin EC 81 MG tablet Take 81 mg by mouth daily.  . DULoxetine (CYMBALTA) 30 MG capsule Take 30 mg by mouth daily.  . DULoxetine (CYMBALTA) 60 MG capsule Take 60 mg by mouth daily.  Marland Kitchen gabapentin (NEURONTIN) 100 MG capsule TAKE (1) CAPSULE BY MOUTH THREE TIMES DAILY  . meloxicam (MOBIC) 7.5 MG tablet Take 7.5 mg 2 (two) times daily by mouth.  . nitroGLYCERIN (NITROSTAT) 0.4 MG SL tablet Place 0.4 mg every 5 (five) minutes as needed under the tongue.   Marland Kitchen oxybutynin (DITROPAN) 5 MG tablet Take 5 mg 2 (two) times daily by mouth.  . pantoprazole (PROTONIX) 40 MG tablet Take 40 mg daily by mouth.  . polyethylene glycol powder (GLYCOLAX/MIRALAX) powder Take 17 g daily as needed by mouth for mild constipation.  . Probiotic Product (ALIGN PO) Take 1 capsule by mouth daily.  . traZODone (DESYREL) 50 MG tablet Take 50 mg by mouth at bedtime.    ROS:  Review of Systems  Constitutional: Negative.   HENT: Negative.   Eyes: Negative.   Respiratory: Negative.   Cardiovascular: Negative.   Gastrointestinal: Negative.   Genitourinary: Negative.   Musculoskeletal: Positive for back pain and joint pain.  Skin: Negative.   Neurological: Negative.   Endo/Heme/Allergies: Negative.   Psychiatric/Behavioral: Negative.   All other systems reviewed and are negative.    Objective:   Today's Vitals: BP 120/80   Pulse 87   Temp (!) 97.4 F (36.3 C) (Oral)   Resp 15   Ht 5\' 3"  (1.6 m)   Wt 193 lb 1.3 oz (87.6 kg)   SpO2 93%   BMI 34.20 kg/m  Vitals with BMI 10/20/2019 10/17/2019 08/31/2017  Height 5\' 3"  5\' 3"  5\' 1"   Weight 184 lbs 3 oz 193 lbs 1 oz 190 lbs  BMI 32.64 75.91 63.84  Systolic 665 993 -  Diastolic 570 80 -  Pulse 88 87 -     Physical Exam Vitals and nursing note reviewed.  Constitutional:      Appearance: Normal appearance. She is well-developed and well-groomed. She is obese.  HENT:     Head: Normocephalic and atraumatic.     Comments: Mask in place    Right  Ear: External ear normal.     Left Ear: External ear normal.     Nose: Nose normal.     Mouth/Throat:     Mouth: Mucous membranes are moist.     Pharynx: Oropharynx is clear.  Eyes:     General:        Right eye: No discharge.        Left eye: No discharge.  Conjunctiva/sclera: Conjunctivae normal.  Cardiovascular:     Rate and Rhythm: Normal rate and regular rhythm.     Pulses: Normal pulses.     Heart sounds: Normal heart sounds.  Pulmonary:     Effort: Pulmonary effort is normal.     Breath sounds: Normal breath sounds.  Musculoskeletal:        General: Normal range of motion.     Cervical back: Normal range of motion and neck supple.     Comments: Abnormal gait- wheelchair bound  Skin:    General: Skin is warm.  Neurological:     General: No focal deficit present.     Mental Status: She is alert and oriented to person, place, and time.  Psychiatric:        Attention and Perception: Attention normal.        Mood and Affect: Mood normal.        Speech: Speech normal.        Behavior: Behavior normal. Behavior is cooperative.        Thought Content: Thought content normal.        Cognition and Memory: Cognition normal.        Judgment: Judgment normal.     Assessment   1. Essential hypertension   2. Chronic obstructive pulmonary disease, unspecified COPD type ([place])   3. Personal history of stroke with current residual effects   4. Recurrent falls   5. Abnormality of gait   6. Hyperlipidemia, unspecified hyperlipidemia type   7. Diastolic dysfunction   8. Overactive bladder   9. Sciatica of right side   10. Rheumatoid arthritis involving multiple sites, unspecified whether rheumatoid factor present ([place])   11. Post-menopausal   12. Wheelchair bound     Tests ordered Orders Placed This Encounter  Procedures  . CBC  . COMPLETE METABOLIC PANEL WITH GFR  . Lipid panel  . TSH  . VITAMIN D 25 Hydroxy  . Rheumatoid Factor  . Ambulatory referral to Cardiology   . Ambulatory referral to Home Health  . Ambulatory referral to Rheumatology   Plan: Please see assessment and plan per problem list above.   Meds ordered this encounter  Medications  . methylPREDNISolone acetate (DEPO-MEDROL) injection 40 mg  . DISCONTD: UNABLE TO FIND    Sig: Hospital Bed    Dispense:  1 Device    Refill:  0    Order Specific Question:   Supervising Provider    Answer:   SIMPSON, MARGARET E [[id number]]  . DISCONTD: UNABLE TO FIND    Sig: Transfer Lift    Dispense:  1 Device    Refill:  0    Order Specific Question:   Supervising Provider    Answer:   Fayrene Helper [[id number]]    Patient to follow-up in 3 months.  [name], NP

## 2019-10-17 NOTE — Assessment & Plan Note (Addendum)
Trouble walking has been in wheelchair for about a 1 year of so. Has had lots of falls.   Abnormal gait, wheelchair bound  Needs transfer lift and hospital bed to help with mobility and prevention of pressure wounds. Is open to PT

## 2019-10-17 NOTE — Assessment & Plan Note (Signed)
Kristie Morris is encouraged to maintain a well balanced diet that is low in salt. Controlled, continue current medication regimen.

## 2019-10-18 ENCOUNTER — Other Ambulatory Visit: Payer: Self-pay

## 2019-10-18 DIAGNOSIS — R296 Repeated falls: Secondary | ICD-10-CM

## 2019-10-18 DIAGNOSIS — R269 Unspecified abnormalities of gait and mobility: Secondary | ICD-10-CM

## 2019-10-18 DIAGNOSIS — Z993 Dependence on wheelchair: Secondary | ICD-10-CM

## 2019-10-18 MED ORDER — UNABLE TO FIND: 0 refills | Status: DC

## 2019-10-19 ENCOUNTER — Telehealth: Payer: Self-pay | Admitting: *Deleted

## 2019-10-19 NOTE — Telephone Encounter (Signed)
Pt was supposed to let Jarrett Soho know the cream that was prescribed by previous provider for breaking out the name is clotrinaole betanethasone dipropionate and that is a cream 1%/0.5%.

## 2019-10-20 ENCOUNTER — Encounter: Payer: Self-pay | Admitting: Cardiovascular Disease

## 2019-10-20 ENCOUNTER — Other Ambulatory Visit: Payer: Self-pay

## 2019-10-20 ENCOUNTER — Ambulatory Visit (INDEPENDENT_AMBULATORY_CARE_PROVIDER_SITE_OTHER): Payer: Medicare HMO | Admitting: Cardiovascular Disease

## 2019-10-20 VITALS — BP 140/100 | HR 88 | Ht 63.0 in | Wt 184.2 lb

## 2019-10-20 DIAGNOSIS — I10 Essential (primary) hypertension: Secondary | ICD-10-CM

## 2019-10-20 DIAGNOSIS — I5032 Chronic diastolic (congestive) heart failure: Secondary | ICD-10-CM | POA: Diagnosis not present

## 2019-10-20 NOTE — Patient Instructions (Signed)
Your physician recommends that you schedule a follow-up appointment in: AS NEEDED WITH DR KONESWARAN  Your physician recommends that you continue on your current medications as directed. Please refer to the Current Medication list given to you today.  Thank you for choosing [facility]!!    

## 2019-10-20 NOTE — Progress Notes (Signed)
CARDIOLOGY CONSULT NOTE  Patient ID: Kristie Morris MRN: [id number] DOB/AGE: 84-Mar-1926 84 y.o.  Admit date: (Not on file) Primary Physician: [name], NP  Reason for Consultation: Chronic diastolic heart failure  HPI: Kristie Morris is a 86 y.o. female who is being seen today for the evaluation of chronic diastolic heart failure at the request of [name], NP.   She also has a history of accelerated hypertension and CVA.  I last evaluated her in 2016.  Echocardiogram on 08/15/2017 demonstrated vigorous LV systolic function, LVEF 65 to 70%, mild LVH, and grade 1 diastolic dysfunction.  Event monitoring in November 2018 demonstrated sinus rhythm with isolated PACs and one episode of sinus tachycardia.  There were no symptoms reported.  Carotid Dopplers on 08/16/2017 demonstrated less than 50% stenosis of bilateral internal carotid arteries.  She underwent a normal nuclear stress test on 10/02/2015.  She previously developed leg swelling with amlodipine but appears to be taking it again.  Primary complaints relate to diffuse arthritis.  She is here with her grandson's wife, Kalman Shan.  [name] and her husband live with the patient.  She has no cardiac complaints today.   Allergies  Allergen Reactions  . Zithromax [Azithromycin] Diarrhea    Pt in hosp for 2 weeks   . Celebrex [Celecoxib] Hives  . Ciprofloxacin Hives  . Metronidazole Hives  . Morphine And Related Rash  . Moxifloxacin Hives  . Penicillins Hives    Has patient had a PCN reaction causing immediate rash, facial/tongue/throat swelling, SOB or lightheadedness with hypotension: No Has patient had a PCN reaction causing severe rash involving mucus membranes or skin necrosis: No Has patient had a PCN reaction that required hospitalization: No Has patient had a PCN reaction occurring within the last 10 years: No If all of the above answers are "NO", then may proceed with Cephalosporin use.   .  Polymyxin B Hives  . Sulfa Antibiotics Hives  . Vancomycin Hives  . Vioxx [Rofecoxib] Hives    Current Outpatient Medications  Medication Sig Dispense Refill  . acetaminophen (TYLENOL) 500 MG tablet Take 1,000 mg daily as needed by mouth for mild pain or moderate pain.    Marland Kitchen albuterol (PROVENTIL HFA;VENTOLIN HFA) 108 (90 BASE) MCG/ACT inhaler Inhale 2 puffs into the lungs every 6 (six) hours as needed for wheezing or shortness of breath.    Marland Kitchen amLODipine (NORVASC) 10 MG tablet Take 10 mg by mouth daily.    Marland Kitchen aspirin EC 81 MG tablet Take 81 mg by mouth daily.    . DULoxetine (CYMBALTA) 30 MG capsule Take 30 mg by mouth daily.    . DULoxetine (CYMBALTA) 60 MG capsule Take 60 mg by mouth daily.    Marland Kitchen gabapentin (NEURONTIN) 100 MG capsule TAKE (1) CAPSULE BY MOUTH THREE TIMES DAILY 90 capsule 0  . meloxicam (MOBIC) 7.5 MG tablet Take 7.5 mg 2 (two) times daily by mouth.    . nitroGLYCERIN (NITROSTAT) 0.4 MG SL tablet Place 0.4 mg every 5 (five) minutes as needed under the tongue.     Marland Kitchen oxybutynin (DITROPAN) 5 MG tablet Take 5 mg 2 (two) times daily by mouth.    . pantoprazole (PROTONIX) 40 MG tablet Take 40 mg daily by mouth.    . polyethylene glycol powder (GLYCOLAX/MIRALAX) powder Take 17 g daily as needed by mouth for mild constipation.    . Probiotic Product (ALIGN PO) Take 1 capsule by mouth daily.    Marland Kitchen  traZODone (DESYREL) 50 MG tablet Take 50 mg by mouth at bedtime.    Marland Kitchen UNABLE TO [facility] Bed 1 Device 0  . UNABLE TO FIND Transfer Lift 1 Device 0   No current facility-administered medications for this visit.    Past Medical History:  Diagnosis Date  . Anxiety   . Arthritis    knee  . Asthma   . CAD (coronary artery disease) cardiologist-  dr Bronson Ing  ([facility] in [place])   2003  Cath with nonobstructive CAD  . Chest pain 08/14/2017  . Chronic headache   . Chronic lower back pain   . COPD (chronic obstructive pulmonary disease) ([place])   . Diastolic CHF, chronic  ([place])   . Duodenal diverticulum   . Edema of left lower extremity   . Fibromyalgia   . Full dentures   . Gait disorder    uses walker  . GERD (gastroesophageal reflux disease)   . Headache disorder 01/17/2015  . Heart murmur   . History of breast cancer   . History of Clostridium difficile   . History of colitis    2001  . History of CVA with residual deficit    06/ 2015  residual left hemiparesis  . History of esophageal dilatation   . History of falling    last fall 06-25-2015  . History of hiatal hernia   . History of small bowel obstruction    04-25-2015  resolved without surgical intervention  . Lesion of bladder   . Low vision, one eye    right eye  . Macular degeneration of both eyes    right eye now low vision/  left eye getting injections currently  . Malignant hypertension   . OAB (overactive bladder)   . Protein-calorie malnutrition, severe ([place]) 01/10/2015  . SBO (small bowel obstruction) ([place]) 04/22/2015  . Schatzki's ring   . Sciatica of right side 05/29/2015  . Urge and stress incontinence   . Varicose veins   . Venous insufficiency, peripheral   . Vertigo     Past Surgical History:  Procedure Laterality Date  . [place]   from colon  . ABDOMINAL HYSTERECTOMY  1954   w/ bilateral salpingoophorectomy  . ANTERIOR CERVICAL DECOMP/DISCECTOMY FUSION  1971   "used bone off of my right hip"  . BREAST LUMPECTOMY Left 2006  . CARDIAC CATHETERIZATION  11-29-2001  dr Tressia Miners turner   Non-obstructive CAD/  20% pLAD,  30% pD1, 50% mRCA/  normal LVF  . CATARACT EXTRACTION W/ INTRAOCULAR LENS  IMPLANT, BILATERAL  right 2008//  left 2015  . CYSTOSCOPY WITH HYDRODISTENSION AND BIOPSY N/A 07/23/2015   Procedure: CYSTOSCOPY;HYDRODISTENSION;  Surgeon: Irine Seal, MD;  Location: [facility];  Service: Urology;  Laterality: N/A;  . DILATION AND CURETTAGE OF UTERUS  1953    miscarriage  . [place]  . KNEE ARTHROSCOPY  Right 2005  . MASTECTOMY, PARTIAL Right 1999  . OVARIAN CYST REMOVAL  1946  . PATELLA FRACTURE SURGERY Left 2008  . TRANSTHORACIC ECHOCARDIOGRAM  01-09-2015   mild LVH,  ef 70-96%, grade I diastolic dysfunction/  mild MR/  trivial pericardial effusion was identified    Social History   Socioeconomic History  . Marital status: Widowed    Spouse name: Not on file  . Number of children: 2  . Years of education: 60  . Highest education level: Not on file  Occupational History  . Occupation: RETIRED  .  Occupation: ACCOUNTANT  Tobacco Use  . Smoking status: Former Smoker    Packs/day: 0.50    Years: 15.00    Pack years: 7.50    Types: Cigarettes    Quit date: 07/17/1987    Years since quitting: 32.2  . Smokeless tobacco: Never Used  Substance and Sexual Activity  . Alcohol use: Yes    Alcohol/week: 1.0 standard drinks    Types: 1 Glasses of wine per week    Comment: some wine  . Drug use: No  . Sexual activity: Not on file  Other Topics Concern  . Not on file  Social History Narrative   Grandson and Granddaughter In Sports coach lives with her   Right handed      3 cats       Enjoy: mysteries- [employer],  And spending time with cats      Diet: at least 2 meals a day: eats all food groups   Caffeine: 1/2 caf coffee   Water: 4-6 cups daily (does add flavors in)      Wears seat belt    Smoke detectors    Social Determinants of Health   Financial Resource Strain:   . Difficulty of Paying Living Expenses: Not on file  Food Insecurity:   . Worried About Charity fundraiser in the Last Year: Not on file  . Ran Out of Food in the Last Year: Not on file  Transportation Needs:   . Lack of Transportation (Medical): Not on file  . Lack of Transportation (Non-Medical): Not on file  Physical Activity:   . Days of Exercise per Week: Not on file  . Minutes of Exercise per Session: Not on file  Stress:   . Feeling of Stress : Not on file  Social Connections:   . Frequency of  Communication with Friends and Family: Not on file  . Frequency of Social Gatherings with Friends and Family: Not on file  . Attends Religious Services: Not on file  . Active Member of Clubs or Organizations: Not on file  . Attends Archivist Meetings: Not on file  . Marital Status: Not on file  Intimate Partner Violence:   . Fear of Current or Ex-Partner: Not on file  . Emotionally Abused: Not on file  . Physically Abused: Not on file  . Sexually Abused: Not on file     No family history of premature CAD in 1st degree relatives.  Current Meds  Medication Sig  . acetaminophen (TYLENOL) 500 MG tablet Take 1,000 mg daily as needed by mouth for mild pain or moderate pain.  Marland Kitchen albuterol (PROVENTIL HFA;VENTOLIN HFA) 108 (90 BASE) MCG/ACT inhaler Inhale 2 puffs into the lungs every 6 (six) hours as needed for wheezing or shortness of breath.  Marland Kitchen amLODipine (NORVASC) 10 MG tablet Take 10 mg by mouth daily.  Marland Kitchen aspirin EC 81 MG tablet Take 81 mg by mouth daily.  . DULoxetine (CYMBALTA) 30 MG capsule Take 30 mg by mouth daily.  . DULoxetine (CYMBALTA) 60 MG capsule Take 60 mg by mouth daily.  Marland Kitchen gabapentin (NEURONTIN) 100 MG capsule TAKE (1) CAPSULE BY MOUTH THREE TIMES DAILY  . meloxicam (MOBIC) 7.5 MG tablet Take 7.5 mg 2 (two) times daily by mouth.  . nitroGLYCERIN (NITROSTAT) 0.4 MG SL tablet Place 0.4 mg every 5 (five) minutes as needed under the tongue.   Marland Kitchen oxybutynin (DITROPAN) 5 MG tablet Take 5 mg 2 (two) times daily by mouth.  . pantoprazole (PROTONIX)  40 MG tablet Take 40 mg daily by mouth.  . polyethylene glycol powder (GLYCOLAX/MIRALAX) powder Take 17 g daily as needed by mouth for mild constipation.  . Probiotic Product (ALIGN PO) Take 1 capsule by mouth daily.  . traZODone (DESYREL) 50 MG tablet Take 50 mg by mouth at bedtime.  Marland Kitchen UNABLE TO [facility] Bed  . UNABLE TO FIND Transfer Lift      Review of systems complete and found to be negative unless listed above in  HPI    Physical exam Height 5\' 3"  (1.6 m), weight 184 lb 3.2 oz (83.6 kg). General: NAD, elderly, frail Neck: No JVD, no thyromegaly or thyroid nodule.  Lungs: Clear to auscultation bilaterally with normal respiratory effort. CV: Nondisplaced PMI. Regular rate and rhythm, normal S1/S2, no S3/S4, no murmur.  Chronic nonpitting edema in bilateral lower extremities.    Abdomen: Soft, nontender, no distention.  Skin: Intact without lesions or rashes.  Neurologic: Alert and oriented x 3.  Psych: Normal affect. Extremities: No clubbing or cyanosis.  HEENT: Normal.   ECG: ECG performed today which appears review demonstrates sinus rhythm with undulating artifact and possible old inferior infarct.   Labs: Lab Results  Component Value Date/Time   K 4.6 08/16/2017 05:16 AM   K 4.0 03/20/2014 03:40 PM   BUN 26 (H) 08/16/2017 05:16 AM   BUN 26 (H) 03/20/2014 03:40 PM   CREATININE 1.56 (H) 08/16/2017 05:16 AM   CREATININE 0.92 06/28/2014 03:52 PM   ALT 12 (L) 08/14/2017 06:36 PM   ALT 13 03/20/2014 03:40 PM   TSH 1.080 01/31/2014 10:53 PM   HGB 11.2 (L) 08/15/2017 05:11 AM   HGB 12.7 03/20/2014 03:40 PM     Lipids: No results found for: LDLCALC, LDLDIRECT, CHOL, TRIG, HDL      ASSESSMENT AND PLAN:   1.  Chronic diastolic heart failure: No indication for diuretics at this time.  She is apparently going to get compression stockings.  I recommended leg elevation at home as well.  No indication for cardiac testing at this time.  2.  Hypertension: Blood pressure is elevated today but the blood pressure cuff hurt her arm and is likely the cause of the elevated blood pressure.  No change to therapy today.     Disposition: Follow up as needed.  Signed: Kate Sable, M.D., F.A.C.C.  10/20/2019, 11:51 AM

## 2019-10-23 DIAGNOSIS — M797 Fibromyalgia: Secondary | ICD-10-CM | POA: Diagnosis not present

## 2019-10-23 DIAGNOSIS — M1710 Unilateral primary osteoarthritis, unspecified knee: Secondary | ICD-10-CM | POA: Diagnosis not present

## 2019-10-23 DIAGNOSIS — I69354 Hemiplegia and hemiparesis following cerebral infarction affecting left non-dominant side: Secondary | ICD-10-CM | POA: Diagnosis not present

## 2019-10-23 DIAGNOSIS — I5032 Chronic diastolic (congestive) heart failure: Secondary | ICD-10-CM | POA: Diagnosis not present

## 2019-10-23 DIAGNOSIS — I69391 Dysphagia following cerebral infarction: Secondary | ICD-10-CM | POA: Diagnosis not present

## 2019-10-23 DIAGNOSIS — I2510 Atherosclerotic heart disease of native coronary artery without angina pectoris: Secondary | ICD-10-CM | POA: Diagnosis not present

## 2019-10-23 DIAGNOSIS — J449 Chronic obstructive pulmonary disease, unspecified: Secondary | ICD-10-CM | POA: Diagnosis not present

## 2019-10-23 DIAGNOSIS — I110 Hypertensive heart disease with heart failure: Secondary | ICD-10-CM | POA: Diagnosis not present

## 2019-10-23 DIAGNOSIS — F419 Anxiety disorder, unspecified: Secondary | ICD-10-CM | POA: Diagnosis not present

## 2019-10-24 ENCOUNTER — Encounter: Payer: Self-pay | Admitting: Family Medicine

## 2019-10-24 ENCOUNTER — Other Ambulatory Visit: Payer: Self-pay | Admitting: Family Medicine

## 2019-10-24 DIAGNOSIS — B49 Unspecified mycosis: Secondary | ICD-10-CM

## 2019-10-24 DIAGNOSIS — Z780 Asymptomatic menopausal state: Secondary | ICD-10-CM | POA: Insufficient documentation

## 2019-10-24 DIAGNOSIS — Z993 Dependence on wheelchair: Secondary | ICD-10-CM | POA: Insufficient documentation

## 2019-10-24 MED ORDER — CLOTRIMAZOLE-BETAMETHASONE 1-0.05 % EX CREA: 1.0000 "application " | TOPICAL_CREAM | Freq: Two times a day (BID) | CUTANEOUS | 0 refills | Status: DC

## 2019-10-24 NOTE — Telephone Encounter (Signed)
Advised patient to get labs drawn. Advised patient's caregiver that the one lab the rheumatology office will need to see with verbal understanding. They will have drawn tomorrow.

## 2019-10-24 NOTE — Assessment & Plan Note (Signed)
Abnormal gait, wheelchair bound  Needs transfer lift and hospital bed to help with mobility and prevention of pressure wounds.

## 2019-10-25 ENCOUNTER — Telehealth: Payer: Self-pay

## 2019-10-25 NOTE — Telephone Encounter (Signed)
Order and ov notes faxed to [employer] [facility]

## 2019-10-25 NOTE — Telephone Encounter (Signed)
Please send order to [facility] for Hospital Bed and lift  The fax number is [phone]

## 2019-10-26 DIAGNOSIS — I69391 Dysphagia following cerebral infarction: Secondary | ICD-10-CM | POA: Diagnosis not present

## 2019-10-26 DIAGNOSIS — M797 Fibromyalgia: Secondary | ICD-10-CM | POA: Diagnosis not present

## 2019-10-26 DIAGNOSIS — I2510 Atherosclerotic heart disease of native coronary artery without angina pectoris: Secondary | ICD-10-CM | POA: Diagnosis not present

## 2019-10-26 DIAGNOSIS — I110 Hypertensive heart disease with heart failure: Secondary | ICD-10-CM | POA: Diagnosis not present

## 2019-10-26 DIAGNOSIS — J449 Chronic obstructive pulmonary disease, unspecified: Secondary | ICD-10-CM | POA: Diagnosis not present

## 2019-10-26 DIAGNOSIS — M1710 Unilateral primary osteoarthritis, unspecified knee: Secondary | ICD-10-CM | POA: Diagnosis not present

## 2019-10-26 DIAGNOSIS — I5032 Chronic diastolic (congestive) heart failure: Secondary | ICD-10-CM | POA: Diagnosis not present

## 2019-10-26 DIAGNOSIS — F419 Anxiety disorder, unspecified: Secondary | ICD-10-CM | POA: Diagnosis not present

## 2019-10-26 DIAGNOSIS — I69354 Hemiplegia and hemiparesis following cerebral infarction affecting left non-dominant side: Secondary | ICD-10-CM | POA: Diagnosis not present

## 2019-10-27 ENCOUNTER — Telehealth: Payer: Self-pay | Admitting: *Deleted

## 2019-10-27 DIAGNOSIS — M069 Rheumatoid arthritis, unspecified: Secondary | ICD-10-CM | POA: Diagnosis not present

## 2019-10-27 DIAGNOSIS — E785 Hyperlipidemia, unspecified: Secondary | ICD-10-CM | POA: Diagnosis not present

## 2019-10-27 DIAGNOSIS — I10 Essential (primary) hypertension: Secondary | ICD-10-CM | POA: Diagnosis not present

## 2019-10-27 DIAGNOSIS — Z780 Asymptomatic menopausal state: Secondary | ICD-10-CM | POA: Diagnosis not present

## 2019-10-27 DIAGNOSIS — E559 Vitamin D deficiency, unspecified: Secondary | ICD-10-CM | POA: Diagnosis not present

## 2019-10-27 NOTE — Telephone Encounter (Signed)
Kristie Morris OT with [facility] called needing verbal orders for home health occupational therapy 1 time a week for 1 week 2 times a week for 3 weeks and 1 time a week for 1 week. She can be reached at [phone] and this can be left on voicemail as it is confidential

## 2019-10-27 NOTE — Telephone Encounter (Signed)
Spoke with Wendelyn Breslow and gave verbal orders

## 2019-10-28 LAB — LIPID PANEL
Cholesterol: 207 mg/dL — ABNORMAL HIGH (ref <200)
HDL: 47 mg/dL — ABNORMAL LOW (ref >=50)
LDL Cholesterol (Calc): 130 mg/dL (calc) — ABNORMAL HIGH
Non-HDL Cholesterol (Calc): 160 mg/dL (calc) — ABNORMAL HIGH (ref <130)
Total CHOL/HDL Ratio: 4.4 (calc) (ref <5.0)
Triglycerides: 160 mg/dL — ABNORMAL HIGH (ref <150)

## 2019-10-28 LAB — CBC
HCT: 38.3 % (ref 35.0–45.0)
Hemoglobin: 12.4 g/dL (ref 11.7–15.5)
MCH: 27 pg (ref 27.0–33.0)
MCHC: 32.4 g/dL (ref 32.0–36.0)
MCV: 83.3 fL (ref 80.0–100.0)
MPV: 11.3 fL (ref 7.5–12.5)
Platelets: 387 10*3/uL (ref 140–400)
RBC: 4.6 10*6/uL (ref 3.80–5.10)
RDW: 14.8 % (ref 11.0–15.0)
WBC: 8.6 10*3/uL (ref 3.8–10.8)

## 2019-10-28 LAB — COMPLETE METABOLIC PANEL WITH GFR
AG Ratio: 1.6 (calc) (ref 1.0–2.5)
ALT: 7 U/L (ref 6–29)
AST: 9 U/L — ABNORMAL LOW (ref 10–35)
Albumin: 4 g/dL (ref 3.6–5.1)
Alkaline phosphatase (APISO): 96 U/L (ref 37–153)
BUN/Creatinine Ratio: 22 (calc) (ref 6–22)
BUN: 26 mg/dL — ABNORMAL HIGH (ref 7–25)
CO2: 30 mmol/L (ref 20–32)
Calcium: 10.8 mg/dL — ABNORMAL HIGH (ref 8.6–10.4)
Chloride: 104 mmol/L (ref 98–110)
Creat: 1.18 mg/dL — ABNORMAL HIGH (ref 0.60–0.88)
GFR, Est African American: 46 mL/min/{1.73_m2} — ABNORMAL LOW (ref >=60)
GFR, Est Non African American: 40 mL/min/{1.73_m2} — ABNORMAL LOW (ref >=60)
Globulin: 2.5 g/dL (calc) (ref 1.9–3.7)
Glucose, Bld: 102 mg/dL — ABNORMAL HIGH (ref 65–99)
Potassium: 4.2 mmol/L (ref 3.5–5.3)
Sodium: 142 mmol/L (ref 135–146)
Total Bilirubin: 0.4 mg/dL (ref 0.2–1.2)
Total Protein: 6.5 g/dL (ref 6.1–8.1)

## 2019-10-28 LAB — RHEUMATOID FACTOR: Rheumatoid fact SerPl-aCnc: <14 IU/mL (ref <14)

## 2019-10-28 LAB — TSH: TSH: 0.95 mIU/L (ref 0.40–4.50)

## 2019-10-28 LAB — VITAMIN D 25 HYDROXY (VIT D DEFICIENCY, FRACTURES): Vit D, 25-Hydroxy: 42 ng/mL (ref 30–100)

## 2019-10-30 ENCOUNTER — Telehealth: Payer: Self-pay | Admitting: *Deleted

## 2019-10-30 DIAGNOSIS — F419 Anxiety disorder, unspecified: Secondary | ICD-10-CM | POA: Diagnosis not present

## 2019-10-30 DIAGNOSIS — M797 Fibromyalgia: Secondary | ICD-10-CM | POA: Diagnosis not present

## 2019-10-30 DIAGNOSIS — I69391 Dysphagia following cerebral infarction: Secondary | ICD-10-CM | POA: Diagnosis not present

## 2019-10-30 DIAGNOSIS — I69354 Hemiplegia and hemiparesis following cerebral infarction affecting left non-dominant side: Secondary | ICD-10-CM | POA: Diagnosis not present

## 2019-10-30 DIAGNOSIS — I110 Hypertensive heart disease with heart failure: Secondary | ICD-10-CM | POA: Diagnosis not present

## 2019-10-30 DIAGNOSIS — J449 Chronic obstructive pulmonary disease, unspecified: Secondary | ICD-10-CM | POA: Diagnosis not present

## 2019-10-30 DIAGNOSIS — M1710 Unilateral primary osteoarthritis, unspecified knee: Secondary | ICD-10-CM | POA: Diagnosis not present

## 2019-10-30 DIAGNOSIS — I2510 Atherosclerotic heart disease of native coronary artery without angina pectoris: Secondary | ICD-10-CM | POA: Diagnosis not present

## 2019-10-30 DIAGNOSIS — I5032 Chronic diastolic (congestive) heart failure: Secondary | ICD-10-CM | POA: Diagnosis not present

## 2019-10-30 NOTE — Telephone Encounter (Signed)
Pt went and had blood work done but [facility] told her they needed a reason code for the vitamin d. Pt daughter rose wants this sent to [facility] so that the insurance will cover it.

## 2019-10-31 ENCOUNTER — Other Ambulatory Visit: Payer: Self-pay | Admitting: Family Medicine

## 2019-10-31 DIAGNOSIS — E785 Hyperlipidemia, unspecified: Secondary | ICD-10-CM

## 2019-10-31 MED ORDER — ROSUVASTATIN CALCIUM 5 MG PO TABS: 5.0000 mg | ORAL_TABLET | Freq: Every day | ORAL | 1 refills | Status: DC

## 2019-10-31 NOTE — Telephone Encounter (Signed)
Information and diagnosis code sent to billing department at [facility]

## 2019-11-01 DIAGNOSIS — I2510 Atherosclerotic heart disease of native coronary artery without angina pectoris: Secondary | ICD-10-CM | POA: Diagnosis not present

## 2019-11-01 DIAGNOSIS — I69354 Hemiplegia and hemiparesis following cerebral infarction affecting left non-dominant side: Secondary | ICD-10-CM | POA: Diagnosis not present

## 2019-11-01 DIAGNOSIS — I110 Hypertensive heart disease with heart failure: Secondary | ICD-10-CM | POA: Diagnosis not present

## 2019-11-01 DIAGNOSIS — F419 Anxiety disorder, unspecified: Secondary | ICD-10-CM | POA: Diagnosis not present

## 2019-11-01 DIAGNOSIS — M1710 Unilateral primary osteoarthritis, unspecified knee: Secondary | ICD-10-CM | POA: Diagnosis not present

## 2019-11-01 DIAGNOSIS — M797 Fibromyalgia: Secondary | ICD-10-CM | POA: Diagnosis not present

## 2019-11-01 DIAGNOSIS — I69391 Dysphagia following cerebral infarction: Secondary | ICD-10-CM | POA: Diagnosis not present

## 2019-11-01 DIAGNOSIS — J449 Chronic obstructive pulmonary disease, unspecified: Secondary | ICD-10-CM | POA: Diagnosis not present

## 2019-11-01 DIAGNOSIS — I5032 Chronic diastolic (congestive) heart failure: Secondary | ICD-10-CM | POA: Diagnosis not present

## 2019-11-02 ENCOUNTER — Telehealth: Payer: Self-pay | Admitting: *Deleted

## 2019-11-02 DIAGNOSIS — F419 Anxiety disorder, unspecified: Secondary | ICD-10-CM | POA: Diagnosis not present

## 2019-11-02 DIAGNOSIS — J449 Chronic obstructive pulmonary disease, unspecified: Secondary | ICD-10-CM | POA: Diagnosis not present

## 2019-11-02 DIAGNOSIS — I110 Hypertensive heart disease with heart failure: Secondary | ICD-10-CM | POA: Diagnosis not present

## 2019-11-02 DIAGNOSIS — M797 Fibromyalgia: Secondary | ICD-10-CM | POA: Diagnosis not present

## 2019-11-02 DIAGNOSIS — M1710 Unilateral primary osteoarthritis, unspecified knee: Secondary | ICD-10-CM | POA: Diagnosis not present

## 2019-11-02 DIAGNOSIS — I69354 Hemiplegia and hemiparesis following cerebral infarction affecting left non-dominant side: Secondary | ICD-10-CM | POA: Diagnosis not present

## 2019-11-02 DIAGNOSIS — I2510 Atherosclerotic heart disease of native coronary artery without angina pectoris: Secondary | ICD-10-CM | POA: Diagnosis not present

## 2019-11-02 DIAGNOSIS — I5032 Chronic diastolic (congestive) heart failure: Secondary | ICD-10-CM | POA: Diagnosis not present

## 2019-11-02 DIAGNOSIS — I69391 Dysphagia following cerebral infarction: Secondary | ICD-10-CM | POA: Diagnosis not present

## 2019-11-02 NOTE — Telephone Encounter (Signed)
Called Amy Lynch to give verbal orders. VM is full and unable to leave detailed message with verbal orders.

## 2019-11-02 NOTE — Telephone Encounter (Signed)
Karn Cassis PT with [facility] called needing verbal orders for physical therapy for two times a week for four weeks this can be left on her voicemail and she can be reached at [phone]

## 2019-11-02 NOTE — Telephone Encounter (Signed)
Called to give verbal orders. No answer, vm is full and can't accept more messages. Will try again later.

## 2019-11-06 ENCOUNTER — Telehealth: Payer: Self-pay

## 2019-11-06 DIAGNOSIS — I5032 Chronic diastolic (congestive) heart failure: Secondary | ICD-10-CM | POA: Diagnosis not present

## 2019-11-06 DIAGNOSIS — F419 Anxiety disorder, unspecified: Secondary | ICD-10-CM | POA: Diagnosis not present

## 2019-11-06 DIAGNOSIS — I69391 Dysphagia following cerebral infarction: Secondary | ICD-10-CM | POA: Diagnosis not present

## 2019-11-06 DIAGNOSIS — J449 Chronic obstructive pulmonary disease, unspecified: Secondary | ICD-10-CM | POA: Diagnosis not present

## 2019-11-06 DIAGNOSIS — R269 Unspecified abnormalities of gait and mobility: Secondary | ICD-10-CM | POA: Diagnosis not present

## 2019-11-06 DIAGNOSIS — R296 Repeated falls: Secondary | ICD-10-CM | POA: Diagnosis not present

## 2019-11-06 DIAGNOSIS — E538 Deficiency of other specified B group vitamins: Secondary | ICD-10-CM | POA: Diagnosis not present

## 2019-11-06 DIAGNOSIS — M797 Fibromyalgia: Secondary | ICD-10-CM | POA: Diagnosis not present

## 2019-11-06 DIAGNOSIS — I69354 Hemiplegia and hemiparesis following cerebral infarction affecting left non-dominant side: Secondary | ICD-10-CM | POA: Diagnosis not present

## 2019-11-06 DIAGNOSIS — M6289 Other specified disorders of muscle: Secondary | ICD-10-CM | POA: Diagnosis not present

## 2019-11-06 DIAGNOSIS — M1710 Unilateral primary osteoarthritis, unspecified knee: Secondary | ICD-10-CM | POA: Diagnosis not present

## 2019-11-06 DIAGNOSIS — I110 Hypertensive heart disease with heart failure: Secondary | ICD-10-CM | POA: Diagnosis not present

## 2019-11-06 DIAGNOSIS — I2510 Atherosclerotic heart disease of native coronary artery without angina pectoris: Secondary | ICD-10-CM | POA: Diagnosis not present

## 2019-11-06 NOTE — Telephone Encounter (Signed)
Amy- Needs verbal orders for Mrs Kristie Morris

## 2019-11-06 NOTE — Telephone Encounter (Signed)
Verbal ok given.

## 2019-11-07 DIAGNOSIS — M1710 Unilateral primary osteoarthritis, unspecified knee: Secondary | ICD-10-CM | POA: Diagnosis not present

## 2019-11-07 DIAGNOSIS — M797 Fibromyalgia: Secondary | ICD-10-CM | POA: Diagnosis not present

## 2019-11-07 DIAGNOSIS — I69391 Dysphagia following cerebral infarction: Secondary | ICD-10-CM | POA: Diagnosis not present

## 2019-11-07 DIAGNOSIS — F419 Anxiety disorder, unspecified: Secondary | ICD-10-CM | POA: Diagnosis not present

## 2019-11-07 DIAGNOSIS — J449 Chronic obstructive pulmonary disease, unspecified: Secondary | ICD-10-CM | POA: Diagnosis not present

## 2019-11-07 DIAGNOSIS — I5032 Chronic diastolic (congestive) heart failure: Secondary | ICD-10-CM | POA: Diagnosis not present

## 2019-11-07 DIAGNOSIS — I110 Hypertensive heart disease with heart failure: Secondary | ICD-10-CM | POA: Diagnosis not present

## 2019-11-07 DIAGNOSIS — I2510 Atherosclerotic heart disease of native coronary artery without angina pectoris: Secondary | ICD-10-CM | POA: Diagnosis not present

## 2019-11-07 DIAGNOSIS — I69354 Hemiplegia and hemiparesis following cerebral infarction affecting left non-dominant side: Secondary | ICD-10-CM | POA: Diagnosis not present

## 2019-11-09 DIAGNOSIS — I2510 Atherosclerotic heart disease of native coronary artery without angina pectoris: Secondary | ICD-10-CM | POA: Diagnosis not present

## 2019-11-09 DIAGNOSIS — M797 Fibromyalgia: Secondary | ICD-10-CM | POA: Diagnosis not present

## 2019-11-09 DIAGNOSIS — I69391 Dysphagia following cerebral infarction: Secondary | ICD-10-CM | POA: Diagnosis not present

## 2019-11-09 DIAGNOSIS — I69354 Hemiplegia and hemiparesis following cerebral infarction affecting left non-dominant side: Secondary | ICD-10-CM | POA: Diagnosis not present

## 2019-11-09 DIAGNOSIS — I110 Hypertensive heart disease with heart failure: Secondary | ICD-10-CM | POA: Diagnosis not present

## 2019-11-09 DIAGNOSIS — F419 Anxiety disorder, unspecified: Secondary | ICD-10-CM | POA: Diagnosis not present

## 2019-11-09 DIAGNOSIS — I5032 Chronic diastolic (congestive) heart failure: Secondary | ICD-10-CM | POA: Diagnosis not present

## 2019-11-09 DIAGNOSIS — M1710 Unilateral primary osteoarthritis, unspecified knee: Secondary | ICD-10-CM | POA: Diagnosis not present

## 2019-11-09 DIAGNOSIS — J449 Chronic obstructive pulmonary disease, unspecified: Secondary | ICD-10-CM | POA: Diagnosis not present

## 2019-11-13 DIAGNOSIS — F419 Anxiety disorder, unspecified: Secondary | ICD-10-CM | POA: Diagnosis not present

## 2019-11-13 DIAGNOSIS — I69354 Hemiplegia and hemiparesis following cerebral infarction affecting left non-dominant side: Secondary | ICD-10-CM | POA: Diagnosis not present

## 2019-11-13 DIAGNOSIS — M1710 Unilateral primary osteoarthritis, unspecified knee: Secondary | ICD-10-CM | POA: Diagnosis not present

## 2019-11-13 DIAGNOSIS — I69391 Dysphagia following cerebral infarction: Secondary | ICD-10-CM | POA: Diagnosis not present

## 2019-11-13 DIAGNOSIS — I2510 Atherosclerotic heart disease of native coronary artery without angina pectoris: Secondary | ICD-10-CM | POA: Diagnosis not present

## 2019-11-13 DIAGNOSIS — M797 Fibromyalgia: Secondary | ICD-10-CM | POA: Diagnosis not present

## 2019-11-13 DIAGNOSIS — J449 Chronic obstructive pulmonary disease, unspecified: Secondary | ICD-10-CM | POA: Diagnosis not present

## 2019-11-13 DIAGNOSIS — I110 Hypertensive heart disease with heart failure: Secondary | ICD-10-CM | POA: Diagnosis not present

## 2019-11-13 DIAGNOSIS — I5032 Chronic diastolic (congestive) heart failure: Secondary | ICD-10-CM | POA: Diagnosis not present

## 2019-11-15 DIAGNOSIS — I110 Hypertensive heart disease with heart failure: Secondary | ICD-10-CM | POA: Diagnosis not present

## 2019-11-15 DIAGNOSIS — M797 Fibromyalgia: Secondary | ICD-10-CM | POA: Diagnosis not present

## 2019-11-15 DIAGNOSIS — M1710 Unilateral primary osteoarthritis, unspecified knee: Secondary | ICD-10-CM | POA: Diagnosis not present

## 2019-11-15 DIAGNOSIS — I69354 Hemiplegia and hemiparesis following cerebral infarction affecting left non-dominant side: Secondary | ICD-10-CM | POA: Diagnosis not present

## 2019-11-15 DIAGNOSIS — J449 Chronic obstructive pulmonary disease, unspecified: Secondary | ICD-10-CM | POA: Diagnosis not present

## 2019-11-15 DIAGNOSIS — I69391 Dysphagia following cerebral infarction: Secondary | ICD-10-CM | POA: Diagnosis not present

## 2019-11-15 DIAGNOSIS — F419 Anxiety disorder, unspecified: Secondary | ICD-10-CM | POA: Diagnosis not present

## 2019-11-15 DIAGNOSIS — I2510 Atherosclerotic heart disease of native coronary artery without angina pectoris: Secondary | ICD-10-CM | POA: Diagnosis not present

## 2019-11-15 DIAGNOSIS — I5032 Chronic diastolic (congestive) heart failure: Secondary | ICD-10-CM | POA: Diagnosis not present

## 2019-11-16 DIAGNOSIS — F419 Anxiety disorder, unspecified: Secondary | ICD-10-CM | POA: Diagnosis not present

## 2019-11-16 DIAGNOSIS — J449 Chronic obstructive pulmonary disease, unspecified: Secondary | ICD-10-CM | POA: Diagnosis not present

## 2019-11-16 DIAGNOSIS — M797 Fibromyalgia: Secondary | ICD-10-CM | POA: Diagnosis not present

## 2019-11-16 DIAGNOSIS — I110 Hypertensive heart disease with heart failure: Secondary | ICD-10-CM | POA: Diagnosis not present

## 2019-11-16 DIAGNOSIS — I2510 Atherosclerotic heart disease of native coronary artery without angina pectoris: Secondary | ICD-10-CM | POA: Diagnosis not present

## 2019-11-16 DIAGNOSIS — I69354 Hemiplegia and hemiparesis following cerebral infarction affecting left non-dominant side: Secondary | ICD-10-CM | POA: Diagnosis not present

## 2019-11-16 DIAGNOSIS — M1710 Unilateral primary osteoarthritis, unspecified knee: Secondary | ICD-10-CM | POA: Diagnosis not present

## 2019-11-16 DIAGNOSIS — I69391 Dysphagia following cerebral infarction: Secondary | ICD-10-CM | POA: Diagnosis not present

## 2019-11-16 DIAGNOSIS — I5032 Chronic diastolic (congestive) heart failure: Secondary | ICD-10-CM | POA: Diagnosis not present

## 2019-11-17 DIAGNOSIS — J449 Chronic obstructive pulmonary disease, unspecified: Secondary | ICD-10-CM | POA: Diagnosis not present

## 2019-11-17 DIAGNOSIS — M1710 Unilateral primary osteoarthritis, unspecified knee: Secondary | ICD-10-CM | POA: Diagnosis not present

## 2019-11-17 DIAGNOSIS — I5032 Chronic diastolic (congestive) heart failure: Secondary | ICD-10-CM | POA: Diagnosis not present

## 2019-11-17 DIAGNOSIS — F419 Anxiety disorder, unspecified: Secondary | ICD-10-CM | POA: Diagnosis not present

## 2019-11-17 DIAGNOSIS — I2510 Atherosclerotic heart disease of native coronary artery without angina pectoris: Secondary | ICD-10-CM | POA: Diagnosis not present

## 2019-11-17 DIAGNOSIS — M797 Fibromyalgia: Secondary | ICD-10-CM | POA: Diagnosis not present

## 2019-11-17 DIAGNOSIS — I110 Hypertensive heart disease with heart failure: Secondary | ICD-10-CM | POA: Diagnosis not present

## 2019-11-17 DIAGNOSIS — I69391 Dysphagia following cerebral infarction: Secondary | ICD-10-CM | POA: Diagnosis not present

## 2019-11-17 DIAGNOSIS — I69354 Hemiplegia and hemiparesis following cerebral infarction affecting left non-dominant side: Secondary | ICD-10-CM | POA: Diagnosis not present

## 2019-11-20 DIAGNOSIS — J449 Chronic obstructive pulmonary disease, unspecified: Secondary | ICD-10-CM | POA: Diagnosis not present

## 2019-11-20 DIAGNOSIS — I5032 Chronic diastolic (congestive) heart failure: Secondary | ICD-10-CM | POA: Diagnosis not present

## 2019-11-20 DIAGNOSIS — I2510 Atherosclerotic heart disease of native coronary artery without angina pectoris: Secondary | ICD-10-CM | POA: Diagnosis not present

## 2019-11-20 DIAGNOSIS — I69391 Dysphagia following cerebral infarction: Secondary | ICD-10-CM | POA: Diagnosis not present

## 2019-11-20 DIAGNOSIS — F419 Anxiety disorder, unspecified: Secondary | ICD-10-CM | POA: Diagnosis not present

## 2019-11-20 DIAGNOSIS — I69354 Hemiplegia and hemiparesis following cerebral infarction affecting left non-dominant side: Secondary | ICD-10-CM | POA: Diagnosis not present

## 2019-11-20 DIAGNOSIS — M1710 Unilateral primary osteoarthritis, unspecified knee: Secondary | ICD-10-CM | POA: Diagnosis not present

## 2019-11-20 DIAGNOSIS — I110 Hypertensive heart disease with heart failure: Secondary | ICD-10-CM | POA: Diagnosis not present

## 2019-11-20 DIAGNOSIS — M797 Fibromyalgia: Secondary | ICD-10-CM | POA: Diagnosis not present

## 2019-12-01 NOTE — Progress Notes (Signed)
Office Visit Note  Patient: Kristie Morris             Date of Birth: 01-Jun-1925           MRN: [id number]             PCP: [name], NP Referring: [name], NP Visit Date: 12/07/2019 Occupation: @GUAROCC @  Subjective:  New Patient (Initial Visit) (Joint pain)   History of Present Illness: Kristie Morris is a 86 y.o. female seen in consultation per request of her PCP.  She has been accompanied by her granddaughter in law today.  According to the patient she developed a stroke about 6 years ago with left-sided weakness.  Over the last 2 years she has been experiencing increased joint pain which has been worse in the last 6 months.  She states that she used to use a walker but for the last 2 years she has been in a wheelchair.  She has discomfort in her entire back, her TMJs, her shoulders, elbows, wrist joints, hands, left knee and her feet.  She has noticed pain and swelling in her wrist joints in her hands.  She is very stiff in the morning.  She has difficulty gripping objects.  Activities of Daily Living:  Patient reports morning stiffness for 45 minutes.   Patient Denies nocturnal pain.  Difficulty dressing/grooming: Reports Difficulty climbing stairs: Reports Difficulty getting out of chair: Reports Difficulty using hands for taps, buttons, cutlery, and/or writing: Reports  Review of Systems  Constitutional: Negative for fatigue, night sweats, weight gain and weight loss.  HENT: Positive for mouth dryness. Negative for mouth sores, trouble swallowing, trouble swallowing and nose dryness.   Eyes: Positive for dryness. Negative for pain, redness and visual disturbance.  Respiratory: Negative for cough, shortness of breath and difficulty breathing.   Cardiovascular: Positive for swelling in legs/feet. Negative for chest pain, palpitations, hypertension and irregular heartbeat.  Gastrointestinal: Positive for constipation. Negative for blood in stool and diarrhea.    Endocrine: Positive for increased urination. Negative for cold intolerance and excessive thirst.  Genitourinary: Positive for involuntary urination. Negative for vaginal dryness.  Musculoskeletal: Positive for arthralgias, gait problem, joint pain, joint swelling, muscle weakness, morning stiffness and muscle tenderness. Negative for myalgias and myalgias.  Skin: Negative for color change, rash, hair loss, skin tightness, ulcers and sensitivity to sunlight.  Allergic/Immunologic: Negative for susceptible to infections.  Neurological: Positive for numbness. Negative for dizziness, memory loss, night sweats and weakness.  Hematological: Negative for bruising/bleeding tendency and swollen glands.  Psychiatric/Behavioral: Negative for depressed mood and sleep disturbance. The patient is not nervous/anxious.     PMFS History:  Patient Active Problem List   Diagnosis Date Noted  . Post-menopausal 10/24/2019  . Wheelchair bound 10/24/2019  . Overactive bladder 10/17/2019  . Rheumatoid arthritis involving multiple sites ([place]) 10/17/2019  . Sciatica of right side 05/29/2015  . Essential hypertension 04/27/2015  . Personal history of stroke with current residual effects 04/27/2015  . Abnormality of gait 01/08/2015  . Recurrent falls 01/08/2015  . Pre-syncope 01/08/2015  . Left-sided weakness 04/02/2014  . Diastolic dysfunction 42/68/3419  . Asthma, chronic 02/02/2014  . COPD (chronic obstructive pulmonary disease) ([place]) 02/02/2014  . Hyperlipidemia 02/02/2014    Past Medical History:  Diagnosis Date  . Anemia 02/02/2014  . Anxiety   . Arthritis    knee  . Asthma   . CAD (coronary artery disease) cardiologist-  dr Bronson Ing  ([facility] in [place])  2003  Cath with nonobstructive CAD  . Chest pain 08/14/2017  . Chronic headache   . Chronic lower back pain   . COPD (chronic obstructive pulmonary disease) ([place])   . Diastolic CHF, chronic ([place])   . Duodenal diverticulum   .  Edema of left lower extremity   . Fibromyalgia   . Full dentures   . Gait disorder    uses walker  . GERD (gastroesophageal reflux disease)   . Headache disorder 01/17/2015  . Heart murmur   . History of breast cancer   . History of Clostridium difficile   . History of colitis    2001  . History of CVA with residual deficit    06/ 2015  residual left hemiparesis  . History of esophageal dilatation   . History of falling    last fall 06-25-2015  . History of hiatal hernia   . History of small bowel obstruction    04-25-2015  resolved without surgical intervention  . Lesion of bladder   . Low vision, one eye    right eye  . Macular degeneration of both eyes    right eye now low vision/  left eye getting injections currently  . Malignant hypertension   . OAB (overactive bladder)   . Protein-calorie malnutrition, severe ([place]) 01/10/2015  . SBO (small bowel obstruction) ([place]) 04/22/2015  . Schatzki's ring   . Sciatica of right side 05/29/2015  . Syncope 08/14/2017  . Urge and stress incontinence   . Varicose veins   . Venous insufficiency, peripheral   . Vertigo     Family History  Problem Relation Age of Onset  . Heart attack Mother   . CAD Mother   . Polycythemia Mother   . Heart attack Sister   . Cancer - Ovarian Sister   . Cancer - Colon Father    Past Surgical History:  Procedure Laterality Date  . [place]   from colon  . ABDOMINAL HYSTERECTOMY  1954   w/ bilateral salpingoophorectomy  . ANTERIOR CERVICAL DECOMP/DISCECTOMY FUSION  1971   "used bone off of my right hip"  . BREAST LUMPECTOMY Left 2006  . CARDIAC CATHETERIZATION  11-29-2001  dr Tressia Miners turner   Non-obstructive CAD/  20% pLAD,  30% pD1, 50% mRCA/  normal LVF  . CATARACT EXTRACTION W/ INTRAOCULAR LENS  IMPLANT, BILATERAL  right 2008//  left 2015  . CYSTOSCOPY WITH HYDRODISTENSION AND BIOPSY N/A 07/23/2015   Procedure: CYSTOSCOPY;HYDRODISTENSION;  Surgeon: Irine Seal, MD;   Location: [facility];  Service: Urology;  Laterality: N/A;  . DILATION AND CURETTAGE OF UTERUS  1953    miscarriage  . [place]  . KNEE ARTHROSCOPY Right 2005  . MASTECTOMY, PARTIAL Right 1999  . OVARIAN CYST REMOVAL  1946  . PATELLA FRACTURE SURGERY Left 2008  . TRANSTHORACIC ECHOCARDIOGRAM  01-09-2015   mild LVH,  ef 29-92%, grade I diastolic dysfunction/  mild MR/  trivial pericardial effusion was identified   Social History   Social History Narrative   Grandson and Granddaughter In Sports coach lives with her   Right handed      3 cats       Enjoy: mysteries- [employer],  And spending time with cats      Diet: at least 2 meals a day: eats all food groups   Caffeine: 1/2 caf coffee   Water: 4-6 cups daily (does add flavors in)      Wears seat  belt    Smoke detectors    Immunization History  Administered Date(s) Administered  . Influenza Nasal 07/27/2019  . Influenza-Unspecified 07/05/2014, 07/06/2015, 06/05/2018  . Pneumococcal Conjugate-13 06/07/2018  . Pneumococcal Polysaccharide-23 07/27/2019     Objective: Vital Signs: BP (!) 151/79 (BP Location: Right Arm, Patient Position: Sitting, Cuff Size: Normal)   Pulse 99   Resp 18    Physical Exam Vitals and nursing note reviewed.  Constitutional:      Appearance: She is well-developed.  HENT:     Head: Normocephalic and atraumatic.  Eyes:     Conjunctiva/sclera: Conjunctivae normal.  Cardiovascular:     Rate and Rhythm: Normal rate and regular rhythm.     Heart sounds: Normal heart sounds.  Pulmonary:     Effort: Pulmonary effort is normal.     Breath sounds: Normal breath sounds.  Abdominal:     General: Bowel sounds are normal.     Palpations: Abdomen is soft.  Musculoskeletal:     Cervical back: Normal range of motion.  Lymphadenopathy:     Cervical: No cervical adenopathy.  Skin:    General: Skin is warm and dry.     Capillary Refill: Capillary refill takes less than 2 seconds.   Neurological:     Mental Status: She is alert and oriented to person, place, and time.  Psychiatric:        Behavior: Behavior normal.      Musculoskeletal Exam: C-spine limited range of motion with discomfort.  Shoulder joints abduction was up to 140 degrees bilaterally.  Elbow joints with good range of motion with no synovitis.  Wrist joints were in good range of motion with no synovitis.  She has right wrist joint extensor tenosynovitis.  She had no MCP PIP or DIP synovitis.  Hip joints were difficult to assess in the wheelchair.  Knee joints were in good range of motion without warmth.  She has edema over bilateral lower extremities.  No MTP swelling or tenderness was noted.  CDAI Exam: CDAI Score: -- Patient Global: --; Provider Global: -- Swollen: --; Tender: -- Joint Exam 12/07/2019   No joint exam has been documented for this visit   There is currently no information documented on the homunculus. Go to the Rheumatology activity and complete the homunculus joint exam.  Investigation: No additional findings.  Imaging: No results found.  Recent Labs: Lab Results  Component Value Date   WBC 8.6 10/27/2019   HGB 12.4 10/27/2019   PLT 387 10/27/2019   NA 142 10/27/2019   K 4.2 10/27/2019   CL 104 10/27/2019   CO2 30 10/27/2019   GLUCOSE 102 (H) 10/27/2019   BUN 26 (H) 10/27/2019   CREATININE 1.18 (H) 10/27/2019   BILITOT 0.4 10/27/2019   ALKPHOS 84 08/14/2017   AST 9 (L) 10/27/2019   ALT 7 10/27/2019   PROT 6.5 10/27/2019   ALBUMIN 3.7 08/14/2017   CALCIUM 10.8 (H) 10/27/2019   GFRAA 46 (L) 10/27/2019    Speciality Comments: No specialty comments available.  Procedures:  No procedures performed Allergies: Zithromax [azithromycin], Celebrex [celecoxib], Ciprofloxacin, Metronidazole, Morphine and related, Moxifloxacin, Penicillins, Polymyxin b, Sulfa antibiotics, Vancomycin, and Vioxx [rofecoxib]   Assessment / Plan:     Visit Diagnoses: Pain in both hands  -patient complains of pain and swelling in bilateral hands.  No swelling was noted except for some right extensor tenosynovitis.  No MCP or PIP swelling was noted.  Her rheumatoid factor was negative.  Plan: XR Hand  2 View Right, XR Hand 2 View Left, x-rays revealed bilateral severe osteoarthritis of the hands.  Sedimentation rate, Cyclic citrul peptide antibody, IgG, 14-3-3 eta Protein, Uric acid.   Chronic pain of both knees -she has been in the wheelchair for many years now.  Her knee joint extension was complete.  No loss effusion was noted.  Plan: XR KNEE 3 VIEW RIGHT, XR KNEE 3 VIEW LEFT.  X-ray showed severe osteoarthritis of the left knee joint and bilateral moderate chondromalacia patella.  High risk medication use - Plan: Glucose 6 phosphate dehydrogenase  Fibromyalgia-she has generalized pain, positive tender points and hyperalgesia. According to the patient and her granddaughter in law she is in chronic pain and her pain is not manageable with Tylenol and NSAIDs.  She may benefit from pain management.  Wheelchair bound-for at least 2 years or longer.  Sciatica of right side-secondary to lower back pain.  Essential hypertension-blood pressure is a still elevated.  Chronic obstructive pulmonary disease, unspecified COPD type ([place])  History of coronary artery disease  Diastolic dysfunction  History of gastroesophageal reflux (GERD)  History of breast cancer  History of Clostridioides difficile infection  History of hyperlipidemia  Orders: Orders Placed This Encounter  Procedures  . XR Hand 2 View Right  . XR Hand 2 View Left  . XR KNEE 3 VIEW RIGHT  . XR KNEE 3 VIEW LEFT  . Sedimentation rate  . Cyclic citrul peptide antibody, IgG  . 14-3-3 eta Protein  . Uric acid  . Glucose 6 phosphate dehydrogenase   No orders of the defined types were placed in this encounter.   Face-to-face time spent with patient was 45 minutes. Greater than 50% of time was spent in  counseling and coordination of care.  Follow-Up Instructions: No follow-ups on file.   Bo Merino, MD  Note - This record has been created using Editor, commissioning.  Chart creation errors have been sought, but may not always  have been located. Such creation errors do not reflect on  the standard of medical care.

## 2019-12-04 DIAGNOSIS — M6289 Other specified disorders of muscle: Secondary | ICD-10-CM | POA: Diagnosis not present

## 2019-12-04 DIAGNOSIS — E538 Deficiency of other specified B group vitamins: Secondary | ICD-10-CM | POA: Diagnosis not present

## 2019-12-04 DIAGNOSIS — R269 Unspecified abnormalities of gait and mobility: Secondary | ICD-10-CM | POA: Diagnosis not present

## 2019-12-04 DIAGNOSIS — R296 Repeated falls: Secondary | ICD-10-CM | POA: Diagnosis not present

## 2019-12-07 ENCOUNTER — Other Ambulatory Visit: Payer: Self-pay

## 2019-12-07 ENCOUNTER — Encounter: Payer: Self-pay | Admitting: Rheumatology

## 2019-12-07 ENCOUNTER — Ambulatory Visit: Payer: Self-pay

## 2019-12-07 ENCOUNTER — Ambulatory Visit (INDEPENDENT_AMBULATORY_CARE_PROVIDER_SITE_OTHER): Payer: Medicare HMO | Admitting: Rheumatology

## 2019-12-07 VITALS — BP 151/79 | HR 99 | Resp 18

## 2019-12-07 DIAGNOSIS — Z853 Personal history of malignant neoplasm of breast: Secondary | ICD-10-CM

## 2019-12-07 DIAGNOSIS — M25562 Pain in left knee: Secondary | ICD-10-CM | POA: Diagnosis not present

## 2019-12-07 DIAGNOSIS — Z8719 Personal history of other diseases of the digestive system: Secondary | ICD-10-CM

## 2019-12-07 DIAGNOSIS — M797 Fibromyalgia: Secondary | ICD-10-CM

## 2019-12-07 DIAGNOSIS — M79642 Pain in left hand: Secondary | ICD-10-CM

## 2019-12-07 DIAGNOSIS — Z993 Dependence on wheelchair: Secondary | ICD-10-CM

## 2019-12-07 DIAGNOSIS — J449 Chronic obstructive pulmonary disease, unspecified: Secondary | ICD-10-CM | POA: Diagnosis not present

## 2019-12-07 DIAGNOSIS — Z8679 Personal history of other diseases of the circulatory system: Secondary | ICD-10-CM

## 2019-12-07 DIAGNOSIS — Z8639 Personal history of other endocrine, nutritional and metabolic disease: Secondary | ICD-10-CM

## 2019-12-07 DIAGNOSIS — Z79899 Other long term (current) drug therapy: Secondary | ICD-10-CM | POA: Diagnosis not present

## 2019-12-07 DIAGNOSIS — G8929 Other chronic pain: Secondary | ICD-10-CM

## 2019-12-07 DIAGNOSIS — I10 Essential (primary) hypertension: Secondary | ICD-10-CM

## 2019-12-07 DIAGNOSIS — M5431 Sciatica, right side: Secondary | ICD-10-CM | POA: Diagnosis not present

## 2019-12-07 DIAGNOSIS — Z8619 Personal history of other infectious and parasitic diseases: Secondary | ICD-10-CM

## 2019-12-07 DIAGNOSIS — M79641 Pain in right hand: Secondary | ICD-10-CM

## 2019-12-07 DIAGNOSIS — I5189 Other ill-defined heart diseases: Secondary | ICD-10-CM

## 2019-12-07 DIAGNOSIS — M25561 Pain in right knee: Secondary | ICD-10-CM | POA: Diagnosis not present

## 2019-12-11 ENCOUNTER — Other Ambulatory Visit: Payer: Self-pay

## 2019-12-11 ENCOUNTER — Telehealth: Payer: Self-pay

## 2019-12-11 MED ORDER — PANTOPRAZOLE SODIUM 40 MG PO TBEC: 40.0000 mg | DELAYED_RELEASE_TABLET | Freq: Every day | ORAL | 5 refills | Status: DC

## 2019-12-11 NOTE — Telephone Encounter (Signed)
Please send in Protonix to [facility]

## 2019-12-11 NOTE — Telephone Encounter (Signed)
Med sent in.

## 2019-12-17 LAB — CYCLIC CITRUL PEPTIDE ANTIBODY, IGG: Cyclic Citrullin Peptide Ab: <16 UNITS

## 2019-12-17 LAB — 14-3-3 ETA PROTEIN: 14-3-3 eta Protein: <0.2 ng/mL (ref <0.2)

## 2019-12-17 LAB — GLUCOSE 6 PHOSPHATE DEHYDROGENASE: G-6PDH: 19.7 U/g Hgb (ref 7.0–20.5)

## 2019-12-17 LAB — SEDIMENTATION RATE: Sed Rate: 36 mm/h — ABNORMAL HIGH (ref 0–30)

## 2019-12-17 LAB — URIC ACID: Uric Acid, Serum: 5.6 mg/dL (ref 2.5–7.0)

## 2019-12-18 NOTE — Progress Notes (Signed)
I will discuss results at the follow-up visit.

## 2019-12-29 NOTE — Progress Notes (Signed)
Office Visit Note  Patient: Kristie Morris             Date of Birth: Nov 07, 1924           MRN: [id number]             PCP: [name], NP Referring: [name], NP Visit Date: 01/02/2020 Occupation: '@GUAROCC'$ @  Subjective:  Discuss lab work   History of Present Illness: AUTRY DROEGE is a 86 y.o. female with history of osteoarthritis and fibromyalgia.  Patient states that she continues to have pain and discomfort in her bilateral hands and bilateral knee joints.  She states all her muscles are painful.  She states none of the medications have been working for her.  She was accompanied by her grandson.  Activities of Daily Living:  Patient reports joint stiffness all day  Patient Reports nocturnal pain.  Difficulty dressing/grooming: Reports Difficulty climbing stairs: Reports Difficulty getting out of chair: Reports Difficulty using hands for taps, buttons, cutlery, and/or writing: Reports  Review of Systems  Constitutional: Negative for fatigue.  HENT: Positive for mouth dryness. Negative for mouth sores and nose dryness.   Eyes: Negative for pain, itching, visual disturbance and dryness.  Respiratory: Negative for cough, hemoptysis, shortness of breath, wheezing and difficulty breathing.   Cardiovascular: Negative for chest pain, palpitations, hypertension and swelling in legs/feet.  Gastrointestinal: Positive for constipation. Negative for blood in stool and diarrhea.  Endocrine: Negative for increased urination.  Genitourinary: Negative for painful urination.  Musculoskeletal: Positive for arthralgias, joint pain, joint swelling, myalgias, morning stiffness, muscle tenderness and myalgias. Negative for muscle weakness.  Skin: Negative for color change, pallor, rash, hair loss, nodules/bumps, skin tightness, ulcers and sensitivity to sunlight.  Allergic/Immunologic: Negative for susceptible to infections.  Neurological: Positive for dizziness, headaches and memory  loss. Negative for weakness.  Hematological: Negative for swollen glands.  Psychiatric/Behavioral: Negative for depressed mood and sleep disturbance. The patient is not nervous/anxious.     PMFS History:  Patient Active Problem List   Diagnosis Date Noted  . Post-menopausal 10/24/2019  . Wheelchair bound 10/24/2019  . Overactive bladder 10/17/2019  . Rheumatoid arthritis involving multiple sites ([place]) 10/17/2019  . Sciatica of right side 05/29/2015  . Essential hypertension 04/27/2015  . Personal history of stroke with current residual effects 04/27/2015  . Abnormality of gait 01/08/2015  . Recurrent falls 01/08/2015  . Pre-syncope 01/08/2015  . Left-sided weakness 04/02/2014  . Diastolic dysfunction 78/67/6720  . Asthma, chronic 02/02/2014  . COPD (chronic obstructive pulmonary disease) ([place]) 02/02/2014  . Hyperlipidemia 02/02/2014    Past Medical History:  Diagnosis Date  . Anemia 02/02/2014  . Anxiety   . Arthritis    knee  . Asthma   . CAD (coronary artery disease) cardiologist-  dr Bronson Ing  ([facility] in [place])   2003  Cath with nonobstructive CAD  . Chest pain 08/14/2017  . Chronic headache   . Chronic lower back pain   . COPD (chronic obstructive pulmonary disease) ([place])   . Diastolic CHF, chronic ([place])   . Duodenal diverticulum   . Edema of left lower extremity   . Fibromyalgia   . Full dentures   . Gait disorder    uses walker  . GERD (gastroesophageal reflux disease)   . Headache disorder 01/17/2015  . Heart murmur   . History of breast cancer   . History of Clostridium difficile   . History of colitis    2001  .  History of CVA with residual deficit    06/ 2015  residual left hemiparesis  . History of esophageal dilatation   . History of falling    last fall 06-25-2015  . History of hiatal hernia   . History of small bowel obstruction    04-25-2015  resolved without surgical intervention  . Lesion of bladder   . Low vision, one eye     right eye  . Macular degeneration of both eyes    right eye now low vision/  left eye getting injections currently  . Malignant hypertension   . OAB (overactive bladder)   . Protein-calorie malnutrition, severe ([place]) 01/10/2015  . SBO (small bowel obstruction) ([place]) 04/22/2015  . Schatzki's ring   . Sciatica of right side 05/29/2015  . Syncope 08/14/2017  . Urge and stress incontinence   . Varicose veins   . Venous insufficiency, peripheral   . Vertigo     Family History  Problem Relation Age of Onset  . Heart attack Mother   . CAD Mother   . Polycythemia Mother   . Heart attack Sister   . Cancer - Ovarian Sister   . Cancer - Colon Father   . Macular degeneration Sister   . Thyroid disease Daughter   . Rheumatic fever Daughter    Past Surgical History:  Procedure Laterality Date  . [place]   from colon  . ABDOMINAL HYSTERECTOMY  1954   w/ bilateral salpingoophorectomy  . ANTERIOR CERVICAL DECOMP/DISCECTOMY FUSION  1971   "used bone off of my right hip"  . BREAST LUMPECTOMY Left 2006  . CARDIAC CATHETERIZATION  11-29-2001  dr Tressia Miners turner   Non-obstructive CAD/  20% pLAD,  30% pD1, 50% mRCA/  normal LVF  . CATARACT EXTRACTION W/ INTRAOCULAR LENS  IMPLANT, BILATERAL  right 2008//  left 2015  . CYSTOSCOPY WITH HYDRODISTENSION AND BIOPSY N/A 07/23/2015   Procedure: CYSTOSCOPY;HYDRODISTENSION;  Surgeon: Irine Seal, MD;  Location: [facility];  Service: Urology;  Laterality: N/A;  . DILATION AND CURETTAGE OF UTERUS  1953    miscarriage  . [place]  . KNEE ARTHROSCOPY Right 2005  . MASTECTOMY, PARTIAL Right 1999  . OVARIAN CYST REMOVAL  1946  . PATELLA FRACTURE SURGERY Left 2008  . TRANSTHORACIC ECHOCARDIOGRAM  01-09-2015   mild LVH,  ef 82-80%, grade I diastolic dysfunction/  mild MR/  trivial pericardial effusion was identified   Social History   Social History Narrative   Grandson and Granddaughter In Sports coach lives with  her   Right handed      3 cats       Enjoy: mysteries- [employer],  And spending time with cats      Diet: at least 2 meals a day: eats all food groups   Caffeine: 1/2 caf coffee   Water: 4-6 cups daily (does add flavors in)      Wears seat belt    Smoke detectors    Immunization History  Administered Date(s) Administered  . Influenza Nasal 07/27/2019  . Influenza-Unspecified 07/05/2014, 07/06/2015, 06/05/2018  . Pneumococcal Conjugate-13 06/07/2018  . Pneumococcal Polysaccharide-23 07/27/2019     Objective: Vital Signs: BP 133/81 (BP Location: Left Wrist, Patient Position: Sitting, Cuff Size: Normal)   Pulse 92   Resp 14    Physical Exam Vitals and nursing note reviewed.  Constitutional:      Appearance: She is well-developed.  HENT:     Head: Normocephalic and atraumatic.  Eyes:     Conjunctiva/sclera: Conjunctivae normal.  Pulmonary:     Effort: Pulmonary effort is normal.  Abdominal:     General: Bowel sounds are normal.     Palpations: Abdomen is soft.  Musculoskeletal:     Cervical back: Normal range of motion.     Right lower leg: Edema present.     Left lower leg: Edema present.  Lymphadenopathy:     Cervical: No cervical adenopathy.  Skin:    General: Skin is warm and dry.     Capillary Refill: Capillary refill takes less than 2 seconds.  Neurological:     Mental Status: She is alert and oriented to person, place, and time.  Psychiatric:        Behavior: Behavior normal.      Musculoskeletal Exam: She has limited range of motion of her cervical spine.  Thoracic and lumbar spine were difficult to assess in the wheelchair.  She has fairly good range of motion of bilateral shoulders and elbows.  Wrist joints with good range of motion.  She has incomplete fist formation due to osteoarthritis.  No synovitis was noted.  Hip joints were not be able to assess.  Knee joints with good range of motion without swelling.  She had bilateral significant pedal edema.   No ankle joint tenderness was noted.  There was no tenderness over MTPs.  CDAI Exam: CDAI Score: -- Patient Global: --; Provider Global: -- Swollen: --; Tender: -- Joint Exam 01/02/2020   No joint exam has been documented for this visit   There is currently no information documented on the homunculus. Go to the Rheumatology activity and complete the homunculus joint exam.  Investigation: No additional findings.  Imaging: XR Hand 2 View Left  Result Date: 12/07/2019 Severe PIP and DIP narrowing with subluxation of some of the PIP joints was noted.  No MCP, intercarpal radiocarpal joint space narrowing was noted.  No erosive changes were noted.  Severe CMC narrowing and subluxation was noted. Impression: These findings are consistent with osteoarthritis of the hand.  XR Hand 2 View Right  Result Date: 12/07/2019 Severe PIP and DIP narrowing was noted.  CMC narrowing was noted.  No significant significant MCP, intercarpal radiocarpal joint space narrowing was noted.  No erosive changes were noted. Impression: These findings are consistent with osteoarthritis of the hand.  XR KNEE 3 VIEW LEFT  Result Date: 12/07/2019 Severe medial compartment narrowing with medial osteophytes was noted.  Moderate patellofemoral narrowing was noted. Impression: These findings are consistent with severe osteoarthritis and moderate chondromalacia patella.  XR KNEE 3 VIEW RIGHT  Result Date: 12/07/2019 No significant medial lateral compartment narrowing was noted.  No chondrocalcinosis was noted.  Moderate patellofemoral narrowing was noted. Impression: These findings are consistent with chondromalacia patella.   Recent Labs: Lab Results  Component Value Date   WBC 8.6 10/27/2019   HGB 12.4 10/27/2019   PLT 387 10/27/2019   NA 142 10/27/2019   K 4.2 10/27/2019   CL 104 10/27/2019   CO2 30 10/27/2019   GLUCOSE 102 (H) 10/27/2019   BUN 26 (H) 10/27/2019   CREATININE 1.18 (H) 10/27/2019   BILITOT 0.4  10/27/2019   ALKPHOS 84 08/14/2017   AST 9 (L) 10/27/2019   ALT 7 10/27/2019   PROT 6.5 10/27/2019   ALBUMIN 3.7 08/14/2017   CALCIUM 10.8 (H) 10/27/2019   GFRAA 46 (L) 10/27/2019  12/07/2019 ESR 36, anti-CCP negative, _0 eta negative, uric acid 5.6, G6PD  normal  Speciality Comments: No specialty comments available.  Procedures:  No procedures performed Allergies: Zithromax [azithromycin], Celebrex [celecoxib], Ciprofloxacin, Metronidazole, Morphine and related, Moxifloxacin, Penicillins, Polymyxin b, Sulfa antibiotics, Vancomycin, and Vioxx [rofecoxib]   Assessment / Plan:     Visit Diagnoses: Primary osteoarthritis of both hands - Bilateral severe osteoarthritis.  Detailed counseling guarding osteoarthritis was provided.  Joint protection muscle strengthening was discussed.  Hand exercises were demonstrated in the office today.  We had detailed discussion regarding the lab work.  She does not have any features of autoimmune disease and the lab is unremarkable.  Primary osteoarthritis of both knees - Left knee joint severe osteoarthritis, bilateral moderate chondromalacia patella.  She is limited to wheelchair due to severe arthritis.  Fibromyalgia-she continues to have generalized pain and discomfort due to fibromyalgia.  She is currently on Cymbalta, fentanyl and Tylenol.  She is also taking Mobic.  Have discouraged the use of meloxicam due to increased risk of GI bleed and effect on renal and liver functions.  Sciatica of right side-she has chronic lower back pain.  Wheelchair bound-need for staying active was emphasized.  Other medical problems are listed as follows:  Essential hypertension  History of coronary artery disease  History of hyperlipidemia  Diastolic dysfunction  Chronic obstructive pulmonary disease, unspecified COPD type ([place])  History of gastroesophageal reflux (GERD)  History of Clostridioides difficile infection  History of breast  cancer  Orders: No orders of the defined types were placed in this encounter.  No orders of the defined types were placed in this encounter.     Follow-Up Instructions: Return if symptoms worsen or fail to improve, for Osteoarthritis, Fibromyalgia.   Bo Merino, MD  Note - This record has been created using Editor, commissioning.  Chart creation errors have been sought, but may not always  have been located. Such creation errors do not reflect on  the standard of medical care.

## 2020-01-02 ENCOUNTER — Ambulatory Visit: Payer: Medicare HMO | Admitting: Rheumatology

## 2020-01-02 ENCOUNTER — Other Ambulatory Visit: Payer: Self-pay

## 2020-01-02 ENCOUNTER — Encounter: Payer: Self-pay | Admitting: Physician Assistant

## 2020-01-02 VITALS — BP 133/81 | HR 92 | Resp 14

## 2020-01-02 DIAGNOSIS — M19042 Primary osteoarthritis, left hand: Secondary | ICD-10-CM

## 2020-01-02 DIAGNOSIS — Z8719 Personal history of other diseases of the digestive system: Secondary | ICD-10-CM

## 2020-01-02 DIAGNOSIS — Z853 Personal history of malignant neoplasm of breast: Secondary | ICD-10-CM

## 2020-01-02 DIAGNOSIS — M170 Bilateral primary osteoarthritis of knee: Secondary | ICD-10-CM

## 2020-01-02 DIAGNOSIS — M797 Fibromyalgia: Secondary | ICD-10-CM

## 2020-01-02 DIAGNOSIS — Z993 Dependence on wheelchair: Secondary | ICD-10-CM

## 2020-01-02 DIAGNOSIS — I5189 Other ill-defined heart diseases: Secondary | ICD-10-CM

## 2020-01-02 DIAGNOSIS — I10 Essential (primary) hypertension: Secondary | ICD-10-CM

## 2020-01-02 DIAGNOSIS — M5431 Sciatica, right side: Secondary | ICD-10-CM

## 2020-01-02 DIAGNOSIS — J449 Chronic obstructive pulmonary disease, unspecified: Secondary | ICD-10-CM

## 2020-01-02 DIAGNOSIS — M19041 Primary osteoarthritis, right hand: Secondary | ICD-10-CM

## 2020-01-02 DIAGNOSIS — Z8639 Personal history of other endocrine, nutritional and metabolic disease: Secondary | ICD-10-CM

## 2020-01-02 DIAGNOSIS — Z8679 Personal history of other diseases of the circulatory system: Secondary | ICD-10-CM

## 2020-01-02 DIAGNOSIS — Z8619 Personal history of other infectious and parasitic diseases: Secondary | ICD-10-CM

## 2020-01-03 ENCOUNTER — Other Ambulatory Visit: Payer: Self-pay | Admitting: *Deleted

## 2020-01-03 DIAGNOSIS — E785 Hyperlipidemia, unspecified: Secondary | ICD-10-CM

## 2020-01-03 MED ORDER — ROSUVASTATIN CALCIUM 5 MG PO TABS: 5.0000 mg | ORAL_TABLET | Freq: Every day | ORAL | 1 refills | Status: DC

## 2020-01-04 DIAGNOSIS — M6289 Other specified disorders of muscle: Secondary | ICD-10-CM | POA: Diagnosis not present

## 2020-01-04 DIAGNOSIS — E538 Deficiency of other specified B group vitamins: Secondary | ICD-10-CM | POA: Diagnosis not present

## 2020-01-04 DIAGNOSIS — R296 Repeated falls: Secondary | ICD-10-CM | POA: Diagnosis not present

## 2020-01-04 DIAGNOSIS — R269 Unspecified abnormalities of gait and mobility: Secondary | ICD-10-CM | POA: Diagnosis not present

## 2020-01-16 ENCOUNTER — Other Ambulatory Visit: Payer: Self-pay

## 2020-01-16 ENCOUNTER — Ambulatory Visit: Payer: Medicare HMO | Admitting: Family Medicine

## 2020-01-16 ENCOUNTER — Encounter: Payer: Self-pay | Admitting: Family Medicine

## 2020-01-16 ENCOUNTER — Telehealth (INDEPENDENT_AMBULATORY_CARE_PROVIDER_SITE_OTHER): Payer: Medicare HMO | Admitting: Family Medicine

## 2020-01-16 VITALS — BP 130/81 | Ht 63.0 in | Wt 184.0 lb

## 2020-01-16 DIAGNOSIS — J449 Chronic obstructive pulmonary disease, unspecified: Secondary | ICD-10-CM

## 2020-01-16 DIAGNOSIS — M1991 Primary osteoarthritis, unspecified site: Secondary | ICD-10-CM

## 2020-01-16 MED ORDER — ALBUTEROL SULFATE HFA 108 (90 BASE) MCG/ACT IN AERS: 2.0000 | INHALATION_SPRAY | Freq: Four times a day (QID) | RESPIRATORY_TRACT | 1 refills | Status: DC | PRN

## 2020-01-16 NOTE — Progress Notes (Signed)
Virtual Visit via Telephone Note   This visit type was conducted due to national recommendations for restrictions regarding the COVID-19 Pandemic (e.g. social distancing) in an effort to limit this patient's exposure and mitigate transmission in our community.  Due to her co-morbid illnesses, this patient is at least at moderate risk for complications without adequate follow up.  This format is felt to be most appropriate for this patient at this time.  The patient did not have access to video technology/had technical difficulties with video requiring transitioning to audio format only (telephone).  All issues noted in this document were discussed and addressed.  No physical exam could be performed with this format.    Evaluation Performed:  Follow-up visit  Date:  01/16/2020   ID:  Kristie Morris, DOB [birth year], MRN [id number]  Patient Location: Home Provider Location: Office  Location of Patient: Home Location of Provider: Telehealth Consent was obtain for visit to be over via telehealth. I verified that I am speaking with the correct person using two identifiers.  PCP:  [name], NP   Chief Complaint:  OA pain   History of Present Illness:    Kristie Morris is a 86 y.o. female with history of arthritis-ICM, recurrent falls, hyperlipidemia, hypertension, COPD among others.  Presents today to see if she can get a referral for osteoarthritis pain to the pain clinic.  Sent to get work-up for rheumatoid arthritis as she reported to me when we first met that she had been told that she had rheumatoid arthritis rheumatologist worked her up and reports that she does not have rheumatoid arthritis she has osteoarthritis.  She is not having any luck controlling her pain is in a lot of discomfort.  She reports taking Tylenol and it not working well.  Reports using muscle rubs without much relief.  The patient does not have symptoms concerning for COVID-19 infection (fever, chills, cough, or  new shortness of breath).   Past Medical, Surgical, Social History, Allergies, and Medications have been Reviewed.  Past Medical History:  Diagnosis Date  . Anemia 02/02/2014  . Anxiety   . Arthritis    knee  . Asthma   . CAD (coronary artery disease) cardiologist-  dr Bronson Ing  ([facility] in [place])   2003  Cath with nonobstructive CAD  . Chest pain 08/14/2017  . Chronic headache   . Chronic lower back pain   . COPD (chronic obstructive pulmonary disease) ([place])   . Diastolic CHF, chronic ([place])   . Duodenal diverticulum   . Edema of left lower extremity   . Fibromyalgia   . Full dentures   . Gait disorder    uses walker  . GERD (gastroesophageal reflux disease)   . Headache disorder 01/17/2015  . Heart murmur   . History of breast cancer   . History of Clostridium difficile   . History of colitis    2001  . History of CVA with residual deficit    06/ 2015  residual left hemiparesis  . History of esophageal dilatation   . History of falling    last fall 06-25-2015  . History of hiatal hernia   . History of small bowel obstruction    04-25-2015  resolved without surgical intervention  . Lesion of bladder   . Low vision, one eye    right eye  . Macular degeneration of both eyes    right eye now low vision/  left eye getting injections currently  .  Malignant hypertension   . OAB (overactive bladder)   . Protein-calorie malnutrition, severe ([place]) 01/10/2015  . SBO (small bowel obstruction) ([place]) 04/22/2015  . Schatzki's ring   . Sciatica of right side 05/29/2015  . Syncope 08/14/2017  . Urge and stress incontinence   . Varicose veins   . Venous insufficiency, peripheral   . Vertigo    Past Surgical History:  Procedure Laterality Date  . [place]   from colon  . ABDOMINAL HYSTERECTOMY  1954   w/ bilateral salpingoophorectomy  . ANTERIOR CERVICAL DECOMP/DISCECTOMY FUSION  1971   "used bone off of my right hip"  . BREAST LUMPECTOMY  Left 2006  . CARDIAC CATHETERIZATION  11-29-2001  dr Tressia Miners turner   Non-obstructive CAD/  20% pLAD,  30% pD1, 50% mRCA/  normal LVF  . CATARACT EXTRACTION W/ INTRAOCULAR LENS  IMPLANT, BILATERAL  right 2008//  left 2015  . CYSTOSCOPY WITH HYDRODISTENSION AND BIOPSY N/A 07/23/2015   Procedure: CYSTOSCOPY;HYDRODISTENSION;  Surgeon: Irine Seal, MD;  Location: [facility];  Service: Urology;  Laterality: N/A;  . DILATION AND CURETTAGE OF UTERUS  1953    miscarriage  . [place]  . KNEE ARTHROSCOPY Right 2005  . MASTECTOMY, PARTIAL Right 1999  . OVARIAN CYST REMOVAL  1946  . PATELLA FRACTURE SURGERY Left 2008  . TRANSTHORACIC ECHOCARDIOGRAM  01-09-2015   mild LVH,  ef 47-99%, grade I diastolic dysfunction/  mild MR/  trivial pericardial effusion was identified     Current Meds  Medication Sig  . acetaminophen (TYLENOL) 500 MG tablet Take 1,000 mg daily as needed by mouth for mild pain or moderate pain.  Marland Kitchen albuterol (PROVENTIL HFA;VENTOLIN HFA) 108 (90 BASE) MCG/ACT inhaler Inhale 2 puffs into the lungs every 6 (six) hours as needed for wheezing or shortness of breath.  Marland Kitchen amLODipine (NORVASC) 10 MG tablet Take 10 mg by mouth daily.  Marland Kitchen aspirin EC 81 MG tablet Take 81 mg by mouth daily.  . clotrimazole-betamethasone (LOTRISONE) cream Apply 1 application topically 2 (two) times daily.  . DULoxetine (CYMBALTA) 30 MG capsule Take 30 mg by mouth daily.  . DULoxetine (CYMBALTA) 60 MG capsule Take 60 mg by mouth daily.  Marland Kitchen gabapentin (NEURONTIN) 100 MG capsule TAKE (1) CAPSULE BY MOUTH THREE TIMES DAILY  . meloxicam (MOBIC) 7.5 MG tablet Take 7.5 mg 2 (two) times daily by mouth.  . nitroGLYCERIN (NITROSTAT) 0.4 MG SL tablet Place 0.4 mg every 5 (five) minutes as needed under the tongue.   Marland Kitchen oxybutynin (DITROPAN) 5 MG tablet Take 5 mg 2 (two) times daily by mouth.  . pantoprazole (PROTONIX) 40 MG tablet Take 1 tablet (40 mg total) by mouth daily.  . polyethylene glycol  powder (GLYCOLAX/MIRALAX) powder Take 17 g daily as needed by mouth for mild constipation.  . Probiotic Product (ALIGN PO) Take 1 capsule by mouth daily.  . rosuvastatin (CRESTOR) 5 MG tablet Take 1 tablet (5 mg total) by mouth daily.  . traZODone (DESYREL) 50 MG tablet Take 50 mg by mouth at bedtime.  Marland Kitchen UNABLE TO [facility] Bed  . UNABLE TO FIND Transfer Lift     Allergies:   Zithromax [azithromycin], Celebrex [celecoxib], Ciprofloxacin, Metronidazole, Morphine and related, Moxifloxacin, Penicillins, Polymyxin b, Sulfa antibiotics, Vancomycin, and Vioxx [rofecoxib]   ROS:   Please see the history of present illness.    All other systems reviewed and are negative.   Labs/Other Tests and Data Reviewed:  Recent Labs: 10/27/2019: ALT 7; BUN 26; Creat 1.18; Hemoglobin 12.4; Platelets 387; Potassium 4.2; Sodium 142; TSH 0.95   Recent Lipid Panel Lab Results  Component Value Date/Time   CHOL 207 (H) 10/27/2019 01:47 PM   TRIG 160 (H) 10/27/2019 01:47 PM   HDL 47 (L) 10/27/2019 01:47 PM   CHOLHDL 4.4 10/27/2019 01:47 PM   LDLCALC 130 (H) 10/27/2019 01:47 PM    Wt Readings from Last 3 Encounters:  01/16/20 184 lb (83.5 kg)  10/20/19 184 lb 3.2 oz (83.6 kg)  10/17/19 193 lb 1.3 oz (87.6 kg)     Objective:    Vital Signs:  BP 130/81   Ht 5' 3"  (1.6 m)   Wt 184 lb (83.5 kg)   BMI 32.59 kg/m    VITAL SIGNS:  reviewed GEN:  alert and oriented  RESPIRATORY:  no shortness of breath in conversation  PSYCH:  normal affect and mood   ASSESSMENT & PLAN:    1. Chronic obstructive pulmonary disease, unspecified COPD type (HCC)  - albuterol (VENTOLIN HFA) 108 (90 Base) MCG/ACT inhaler; Inhale 2 puffs into the lungs every 6 (six) hours as needed for wheezing or shortness of breath.  Dispense: 8 g; Refill: 1  2. Primary osteoarthritis, unspecified site  - Ambulatory referral to Pain Clinic   Time:   Today, I have spent 15 minutes with the patient with telehealth technology  discussing the above problems.     Medication Adjustments/Labs and Tests Ordered: Current medicines are reviewed at length with the patient today.  Concerns regarding medicines are outlined above.   Tests Ordered: No orders of the defined types were placed in this encounter.   Medication Changes: No orders of the defined types were placed in this encounter.   Disposition:  Follow up 5 months Signed, [name], NP  01/16/2020 10:51 AM     [facility]

## 2020-01-17 ENCOUNTER — Encounter: Payer: Self-pay | Admitting: Family Medicine

## 2020-01-17 DIAGNOSIS — M1991 Primary osteoarthritis, unspecified site: Secondary | ICD-10-CM | POA: Insufficient documentation

## 2020-01-17 NOTE — Assessment & Plan Note (Signed)
Provided with referral to pain clinic to help with primary osteoarthritis.

## 2020-01-17 NOTE — Assessment & Plan Note (Addendum)
Needs refill of inhaler, denies have any signs or symptoms of COPD exacerbation.

## 2020-01-17 NOTE — Patient Instructions (Signed)
I appreciate the opportunity to provide you with care for your health and wellness. Today we discussed: pain  Follow up: 5 months   No labs   Referral to pain clinic made   Please continue to practice social distancing to keep you, your family, and our community safe.  If you must go out, please wear a mask and practice good handwashing.  It was a pleasure to see you and I look forward to continuing to work together on your health and well-being. Please do not hesitate to call the office if you need care or have questions about your care.  Have a wonderful day and week. With Gratitude, Cherly Beach, DNP, AGNP-BC

## 2020-01-22 ENCOUNTER — Telehealth (INDEPENDENT_AMBULATORY_CARE_PROVIDER_SITE_OTHER): Payer: Medicare HMO

## 2020-01-22 ENCOUNTER — Other Ambulatory Visit: Payer: Self-pay | Admitting: *Deleted

## 2020-01-22 ENCOUNTER — Other Ambulatory Visit: Payer: Self-pay

## 2020-01-22 VITALS — BP 130/81 | Ht 63.0 in | Wt 184.0 lb

## 2020-01-22 DIAGNOSIS — Z0000 Encounter for general adult medical examination without abnormal findings: Secondary | ICD-10-CM | POA: Diagnosis not present

## 2020-01-22 MED ORDER — DULOXETINE HCL 60 MG PO CPEP: 60.0000 mg | ORAL_CAPSULE | Freq: Every day | ORAL | 3 refills | Status: DC

## 2020-01-22 NOTE — Progress Notes (Signed)
Subjective:   Kristie Morris is a 86 y.o. female who presents for an Initial Medicare Annual Wellness Visit.  Review of Systems         Cardiac Risk Factors include: advanced age (>45men, >67 women);hypertension;dyslipidemia;sedentary lifestyle;obesity (BMI >30kg/m2)     Objective:    Today's Vitals   01/22/20 1439  BP: 130/81  Weight: 184 lb (83.5 kg)  Height: 5\' 3"  (1.6 m)   Body mass index is 32.59 kg/m.  Advanced Directives 08/31/2017 08/14/2017 07/23/2015 04/22/2015 03/12/2015 01/08/2015 12/13/2014  Does Patient Have a Medical Advance Directive? No No No No No No No  Would patient like information on creating a medical advance directive? No - Patient declined No - Patient declined No - patient declined information - - No - patient declined information -  Pre-existing out of facility DNR order (yellow form or pink MOST form) - - - - - - -    Current Medications (verified) Outpatient Encounter Medications as of 01/22/2020  Medication Sig  . acetaminophen (TYLENOL) 500 MG tablet Take 1,000 mg daily as needed by mouth for mild pain or moderate pain.  Marland Kitchen albuterol (VENTOLIN HFA) 108 (90 Base) MCG/ACT inhaler Inhale 2 puffs into the lungs every 6 (six) hours as needed for wheezing or shortness of breath.  Marland Kitchen amLODipine (NORVASC) 10 MG tablet Take 10 mg by mouth daily.  Marland Kitchen aspirin EC 81 MG tablet Take 81 mg by mouth daily.  . clotrimazole-betamethasone (LOTRISONE) cream Apply 1 application topically 2 (two) times daily.  . DULoxetine (CYMBALTA) 30 MG capsule Take 30 mg by mouth daily.  . DULoxetine (CYMBALTA) 60 MG capsule Take 1 capsule (60 mg total) by mouth daily.  Marland Kitchen gabapentin (NEURONTIN) 100 MG capsule TAKE (1) CAPSULE BY MOUTH THREE TIMES DAILY  . meloxicam (MOBIC) 7.5 MG tablet Take 7.5 mg 2 (two) times daily by mouth.  . nitroGLYCERIN (NITROSTAT) 0.4 MG SL tablet Place 0.4 mg every 5 (five) minutes as needed under the tongue.   Marland Kitchen oxybutynin (DITROPAN) 5 MG tablet Take 5 mg 2  (two) times daily by mouth.  . pantoprazole (PROTONIX) 40 MG tablet Take 1 tablet (40 mg total) by mouth daily.  . polyethylene glycol powder (GLYCOLAX/MIRALAX) powder Take 17 g daily as needed by mouth for mild constipation.  . Probiotic Product (ALIGN PO) Take 1 capsule by mouth daily.  . rosuvastatin (CRESTOR) 5 MG tablet Take 1 tablet (5 mg total) by mouth daily.  . traZODone (DESYREL) 50 MG tablet Take 50 mg by mouth at bedtime.  Marland Kitchen UNABLE TO [facility] Bed  . UNABLE TO FIND Transfer Lift  . [DISCONTINUED] DULoxetine (CYMBALTA) 60 MG capsule Take 60 mg by mouth daily.   No facility-administered encounter medications on file as of 01/22/2020.    Allergies (verified) Zithromax [azithromycin], Celebrex [celecoxib], Ciprofloxacin, Metronidazole, Morphine and related, Moxifloxacin, Penicillins, Polymyxin b, Sulfa antibiotics, Vancomycin, and Vioxx [rofecoxib]   History: Past Medical History:  Diagnosis Date  . Anemia 02/02/2014  . Anxiety   . Arthritis    knee  . Asthma   . CAD (coronary artery disease) cardiologist-  dr Bronson Ing  ([facility] in [place])   2003  Cath with nonobstructive CAD  . Chest pain 08/14/2017  . Chronic headache   . Chronic lower back pain   . COPD (chronic obstructive pulmonary disease) ([place])   . Diastolic CHF, chronic ([place])   . Duodenal diverticulum   . Edema of left lower extremity   . Fibromyalgia   .  Full dentures   . Gait disorder    uses walker  . GERD (gastroesophageal reflux disease)   . Headache disorder 01/17/2015  . Heart murmur   . History of breast cancer   . History of Clostridium difficile   . History of colitis    2001  . History of CVA with residual deficit    06/ 2015  residual left hemiparesis  . History of esophageal dilatation   . History of falling    last fall 06-25-2015  . History of hiatal hernia   . History of small bowel obstruction    04-25-2015  resolved without surgical intervention  . Lesion of bladder     . Low vision, one eye    right eye  . Macular degeneration of both eyes    right eye now low vision/  left eye getting injections currently  . Malignant hypertension   . OAB (overactive bladder)   . Protein-calorie malnutrition, severe ([place]) 01/10/2015  . SBO (small bowel obstruction) ([place]) 04/22/2015  . Schatzki's ring   . Sciatica of right side 05/29/2015  . Syncope 08/14/2017  . Urge and stress incontinence   . Varicose veins   . Venous insufficiency, peripheral   . Vertigo    Past Surgical History:  Procedure Laterality Date  . [place]   from colon  . ABDOMINAL HYSTERECTOMY  1954   w/ bilateral salpingoophorectomy  . ANTERIOR CERVICAL DECOMP/DISCECTOMY FUSION  1971   "used bone off of my right hip"  . BREAST LUMPECTOMY Left 2006  . CARDIAC CATHETERIZATION  11-29-2001  dr Tressia Miners turner   Non-obstructive CAD/  20% pLAD,  30% pD1, 50% mRCA/  normal LVF  . CATARACT EXTRACTION W/ INTRAOCULAR LENS  IMPLANT, BILATERAL  right 2008//  left 2015  . CYSTOSCOPY WITH HYDRODISTENSION AND BIOPSY N/A 07/23/2015   Procedure: CYSTOSCOPY;HYDRODISTENSION;  Surgeon: Irine Seal, MD;  Location: [facility];  Service: Urology;  Laterality: N/A;  . DILATION AND CURETTAGE OF UTERUS  1953    miscarriage  . [place]  . KNEE ARTHROSCOPY Right 2005  . MASTECTOMY, PARTIAL Right 1999  . OVARIAN CYST REMOVAL  1946  . PATELLA FRACTURE SURGERY Left 2008  . TRANSTHORACIC ECHOCARDIOGRAM  01-09-2015   mild LVH,  ef 41-96%, grade I diastolic dysfunction/  mild MR/  trivial pericardial effusion was identified   Family History  Problem Relation Age of Onset  . Heart attack Mother   . CAD Mother   . Polycythemia Mother   . Heart attack Sister   . Cancer - Ovarian Sister   . Cancer - Colon Father   . Macular degeneration Sister   . Thyroid disease Daughter   . Rheumatic fever Daughter    Social History   Socioeconomic History  . Marital status:  Widowed    Spouse name: Not on file  . Number of children: 2  . Years of education: 68  . Highest education level: Not on file  Occupational History  . Occupation: RETIRED  . Occupation: ACCOUNTANT  Tobacco Use  . Smoking status: Former Smoker    Packs/day: 0.50    Years: 15.00    Pack years: 7.50    Types: Cigarettes    Quit date: 07/17/1987    Years since quitting: 32.5  . Smokeless tobacco: Never Used  Substance and Sexual Activity  . Alcohol use: Yes    Alcohol/week: 1.0 standard drinks    Types: 1 Glasses of  wine per week    Comment: rarely  . Drug use: No  . Sexual activity: Not on file  Other Topics Concern  . Not on file  Social History Narrative   Grandson and Granddaughter In Sports coach lives with her   Right handed      3 cats       Enjoy: mysteries- Ms Risk manager,  And spending time with cats      Diet: at least 2 meals a day: eats all food groups   Caffeine: 1/2 caf coffee   Water: 4-6 cups daily (does add flavors in)      Wears seat belt    Smoke detectors    Social Determinants of Health   Financial Resource Strain:   . Difficulty of Paying Living Expenses:   Food Insecurity:   . Worried About Charity fundraiser in the Last Year:   . Arboriculturist in the Last Year:   Transportation Needs: No Transportation Needs  . Lack of Transportation (Medical): No  . Lack of Transportation (Non-Medical): No  Physical Activity:   . Days of Exercise per Week:   . Minutes of Exercise per Session:   Stress:   . Feeling of Stress :   Social Connections: Somewhat Isolated  . Frequency of Communication with Friends and Family: Twice a week  . Frequency of Social Gatherings with Friends and Family: Once a week  . Attends Religious Services: Never  . Active Member of Clubs or Organizations: No  . Attends Archivist Meetings: 1 to 4 times per year  . Marital Status: Widowed    Tobacco Counseling Counseling given: Not Answered   Clinical Intake:                          Activities of Daily Living In your present state of health, do you have any difficulty performing the following activities: 01/22/2020  Hearing? N  Vision? N  Difficulty concentrating or making decisions? N  Walking or climbing stairs? Y  Dressing or bathing? Y  Doing errands, shopping? Y  Preparing Food and eating ? Y  Using the Toilet? Y  In the past six months, have you accidently leaked urine? Y  Do you have problems with loss of bowel control? N  Managing your Medications? Y  Managing your Finances? Y  Housekeeping or managing your Housekeeping? Y  Some recent data might be hidden     Immunizations and Health Maintenance Immunization History  Administered Date(s) Administered  . Influenza Nasal 07/27/2019  . Influenza-Unspecified 07/05/2014, 07/06/2015, 06/05/2018  . Pneumococcal Conjugate-13 06/07/2018  . Pneumococcal Polysaccharide-23 07/27/2019   Health Maintenance Due  Topic Date Due  . COVID-19 Vaccine (1) Never done    Patient Care Team: [name], NP as PCP - General (Family Medicine) Herminio Commons, MD as PCP - Cardiology (Cardiology) Herminio Commons, MD as Attending Physician (Cardiology)  Indicate any recent Medical Services you may have received from other than [facility] providers in the past year (date may be approximate).     Assessment:   This is a routine wellness examination for Travonna.  Hearing/Vision screen No exam data present  Dietary issues and exercise activities discussed: Current Exercise Habits: The patient does not participate in regular exercise at present, Exercise limited by: orthopedic condition(s);respiratory conditions(s)  Goals    . Increase physical activity     Try some chair exercises to improve mobility     .  Prevent falls     Always ambulate with help        Depression Screen PHQ 2/9 Scores 01/22/2020 10/17/2019 02/05/2015  PHQ - 2 Score 4 0 1  PHQ- 9 Score 7 - -    Fall  Risk Fall Risk  01/22/2020 01/16/2020 10/17/2019 02/05/2015  Falls in the past year? 0 1 1 Yes  Number falls in past yr: 1 1 1 2  or more  Injury with Fall? 0 1 1 Yes  Risk Factor Category  - - - High Fall Risk  Risk for fall due to : - - - History of fall(s);Impaired balance/gait;Impaired vision;Impaired mobility  Follow up - - - Falls prevention discussed    Is the patient's home free of loose throw rugs in walkways, pet beds, electrical cords, etc?   yes      Grab bars in the bathroom? yes      Handrails on the stairs?   yes      Adequate lighting?   yes  Timed Get Up and Go Performed unable to do virtual visit   Cognitive Function:     6CIT Screen 01/22/2020  What Year? 0 points  What month? 0 points  What time? 0 points  Count back from 20 0 points  Months in reverse 4 points  Repeat phrase 2 points  Total Score 6    Screening Tests Health Maintenance  Topic Date Due  . COVID-19 Vaccine (1) Never done  . DEXA SCAN  10/16/2020 (Originally 01/29/1992)  . TETANUS/TDAP  10/16/2020 (Originally 01/28/1944)  . INFLUENZA VACCINE  05/05/2020  . PNA vac Low Risk Adult  Completed     Qualifies for Shingles Vaccine? Check with pharmacy   Cancer Screenings: Lung: Low Dose CT Chest recommended if Age 48-80 years, 30 pack-year currently smoking OR have quit w/in 15years. Patient does not qualify. Breast: Up to date on Mammogram? Yes- no longer needed   Up to date of Bone Density/Dexa? Yes Colorectal: no longer needed   Additional Screenings:  Hepatitis C Screening: needs once      Plan:     I have personally reviewed and noted the following in the patient's chart:   . Medical and social history . Use of alcohol, tobacco or illicit drugs  . Current medications and supplements . Functional ability and status . Nutritional status . Physical activity . Advanced directives . List of other physicians . Hospitalizations, surgeries, and ER visits in previous 12  months . Vitals . Screenings to include cognitive, depression, and falls . Referrals and appointments  In addition, I have reviewed and discussed with patient certain preventive protocols, quality metrics, and best practice recommendations. A written personalized care plan for preventive services as well as general preventive health recommendations were provided to patient.     Kate Sable, LPN, LPN   01/21/6221        Subjective:   KELLSIE GRINDLE is a 86 y.o. female who presents for Medicare Annual (Subsequent) preventive examination.  Review of Systems:   Cardiac Risk Factors include: advanced age (>9men, >81 women);hypertension;dyslipidemia;sedentary lifestyle;obesity (BMI >30kg/m2)     Objective:     Vitals: BP 130/81   Ht 5\' 3"  (1.6 m)   Wt 184 lb (83.5 kg)   BMI 32.59 kg/m   Body mass index is 32.59 kg/m.  Advanced Directives 08/31/2017 08/14/2017 07/23/2015 04/22/2015 03/12/2015 01/08/2015 12/13/2014  Does Patient Have a Medical Advance Directive? No No No No No No No  Would patient  like information on creating a medical advance directive? No - Patient declined No - Patient declined No - patient declined information - - No - patient declined information -  Pre-existing out of facility DNR order (yellow form or pink MOST form) - - - - - - -    Tobacco Social History   Tobacco Use  Smoking Status Former Smoker  . Packs/day: 0.50  . Years: 15.00  . Pack years: 7.50  . Types: Cigarettes  . Quit date: 07/17/1987  . Years since quitting: 32.5  Smokeless Tobacco Never Used     Counseling given: Not Answered   Clinical Intake:                       Past Medical History:  Diagnosis Date  . Anemia 02/02/2014  . Anxiety   . Arthritis    knee  . Asthma   . CAD (coronary artery disease) cardiologist-  dr Bronson Ing  ([facility] in [place])   2003  Cath with nonobstructive CAD  . Chest pain 08/14/2017  . Chronic headache   . Chronic lower back  pain   . COPD (chronic obstructive pulmonary disease) ([place])   . Diastolic CHF, chronic ([place])   . Duodenal diverticulum   . Edema of left lower extremity   . Fibromyalgia   . Full dentures   . Gait disorder    uses walker  . GERD (gastroesophageal reflux disease)   . Headache disorder 01/17/2015  . Heart murmur   . History of breast cancer   . History of Clostridium difficile   . History of colitis    2001  . History of CVA with residual deficit    06/ 2015  residual left hemiparesis  . History of esophageal dilatation   . History of falling    last fall 06-25-2015  . History of hiatal hernia   . History of small bowel obstruction    04-25-2015  resolved without surgical intervention  . Lesion of bladder   . Low vision, one eye    right eye  . Macular degeneration of both eyes    right eye now low vision/  left eye getting injections currently  . Malignant hypertension   . OAB (overactive bladder)   . Protein-calorie malnutrition, severe ([place]) 01/10/2015  . SBO (small bowel obstruction) ([place]) 04/22/2015  . Schatzki's ring   . Sciatica of right side 05/29/2015  . Syncope 08/14/2017  . Urge and stress incontinence   . Varicose veins   . Venous insufficiency, peripheral   . Vertigo    Past Surgical History:  Procedure Laterality Date  . [place]   from colon  . ABDOMINAL HYSTERECTOMY  1954   w/ bilateral salpingoophorectomy  . ANTERIOR CERVICAL DECOMP/DISCECTOMY FUSION  1971   "used bone off of my right hip"  . BREAST LUMPECTOMY Left 2006  . CARDIAC CATHETERIZATION  11-29-2001  dr Tressia Miners turner   Non-obstructive CAD/  20% pLAD,  30% pD1, 50% mRCA/  normal LVF  . CATARACT EXTRACTION W/ INTRAOCULAR LENS  IMPLANT, BILATERAL  right 2008//  left 2015  . CYSTOSCOPY WITH HYDRODISTENSION AND BIOPSY N/A 07/23/2015   Procedure: CYSTOSCOPY;HYDRODISTENSION;  Surgeon: Irine Seal, MD;  Location: [facility];  Service: Urology;  Laterality: N/A;  .  DILATION AND CURETTAGE OF UTERUS  1953    miscarriage  . [place]  . KNEE ARTHROSCOPY Right 2005  . MASTECTOMY, PARTIAL Right  [place]  . PATELLA FRACTURE SURGERY Left 2008  . TRANSTHORACIC ECHOCARDIOGRAM  01-09-2015   mild LVH,  ef 37-10%, grade I diastolic dysfunction/  mild MR/  trivial pericardial effusion was identified   Family History  Problem Relation Age of Onset  . Heart attack Mother   . CAD Mother   . Polycythemia Mother   . Heart attack Sister   . Cancer - Ovarian Sister   . Cancer - Colon Father   . Macular degeneration Sister   . Thyroid disease Daughter   . Rheumatic fever Daughter    Social History   Socioeconomic History  . Marital status: Widowed    Spouse name: Not on file  . Number of children: 2  . Years of education: 72  . Highest education level: Not on file  Occupational History  . Occupation: RETIRED  . Occupation: ACCOUNTANT  Tobacco Use  . Smoking status: Former Smoker    Packs/day: 0.50    Years: 15.00    Pack years: 7.50    Types: Cigarettes    Quit date: 07/17/1987    Years since quitting: 32.5  . Smokeless tobacco: Never Used  Substance and Sexual Activity  . Alcohol use: Yes    Alcohol/week: 1.0 standard drinks    Types: 1 Glasses of wine per week    Comment: rarely  . Drug use: No  . Sexual activity: Not on file  Other Topics Concern  . Not on file  Social History Narrative   Grandson and Granddaughter In Sports coach lives with her   Right handed      3 cats       Enjoy: mysteries- Ms Risk manager,  And spending time with cats      Diet: at least 2 meals a day: eats all food groups   Caffeine: 1/2 caf coffee   Water: 4-6 cups daily (does add flavors in)      Wears seat belt    Smoke detectors    Social Determinants of Health   Financial Resource Strain:   . Difficulty of Paying Living Expenses:   Food Insecurity:   . Worried About Charity fundraiser in the Last Year:   . Academic librarian in the Last Year:   Transportation Needs: No Transportation Needs  . Lack of Transportation (Medical): No  . Lack of Transportation (Non-Medical): No  Physical Activity:   . Days of Exercise per Week:   . Minutes of Exercise per Session:   Stress:   . Feeling of Stress :   Social Connections: Somewhat Isolated  . Frequency of Communication with Friends and Family: Twice a week  . Frequency of Social Gatherings with Friends and Family: Once a week  . Attends Religious Services: Never  . Active Member of Clubs or Organizations: No  . Attends Archivist Meetings: 1 to 4 times per year  . Marital Status: Widowed    Outpatient Encounter Medications as of 01/22/2020  Medication Sig  . acetaminophen (TYLENOL) 500 MG tablet Take 1,000 mg daily as needed by mouth for mild pain or moderate pain.  Marland Kitchen albuterol (VENTOLIN HFA) 108 (90 Base) MCG/ACT inhaler Inhale 2 puffs into the lungs every 6 (six) hours as needed for wheezing or shortness of breath.  Marland Kitchen amLODipine (NORVASC) 10 MG tablet Take 10 mg by mouth daily.  Marland Kitchen aspirin EC 81 MG tablet Take 81 mg by mouth daily.  . clotrimazole-betamethasone (LOTRISONE) cream  Apply 1 application topically 2 (two) times daily.  . DULoxetine (CYMBALTA) 30 MG capsule Take 30 mg by mouth daily.  . DULoxetine (CYMBALTA) 60 MG capsule Take 1 capsule (60 mg total) by mouth daily.  Marland Kitchen gabapentin (NEURONTIN) 100 MG capsule TAKE (1) CAPSULE BY MOUTH THREE TIMES DAILY  . meloxicam (MOBIC) 7.5 MG tablet Take 7.5 mg 2 (two) times daily by mouth.  . nitroGLYCERIN (NITROSTAT) 0.4 MG SL tablet Place 0.4 mg every 5 (five) minutes as needed under the tongue.   Marland Kitchen oxybutynin (DITROPAN) 5 MG tablet Take 5 mg 2 (two) times daily by mouth.  . pantoprazole (PROTONIX) 40 MG tablet Take 1 tablet (40 mg total) by mouth daily.  . polyethylene glycol powder (GLYCOLAX/MIRALAX) powder Take 17 g daily as needed by mouth for mild constipation.  . Probiotic Product (ALIGN PO)  Take 1 capsule by mouth daily.  . rosuvastatin (CRESTOR) 5 MG tablet Take 1 tablet (5 mg total) by mouth daily.  . traZODone (DESYREL) 50 MG tablet Take 50 mg by mouth at bedtime.  Marland Kitchen UNABLE TO [facility] Bed  . UNABLE TO FIND Transfer Lift  . [DISCONTINUED] DULoxetine (CYMBALTA) 60 MG capsule Take 60 mg by mouth daily.   No facility-administered encounter medications on file as of 01/22/2020.    Activities of Daily Living In your present state of health, do you have any difficulty performing the following activities: 01/22/2020  Hearing? N  Vision? N  Difficulty concentrating or making decisions? N  Walking or climbing stairs? Y  Dressing or bathing? Y  Doing errands, shopping? Y  Preparing Food and eating ? Y  Using the Toilet? Y  In the past six months, have you accidently leaked urine? Y  Do you have problems with loss of bowel control? N  Managing your Medications? Y  Managing your Finances? Y  Housekeeping or managing your Housekeeping? Y  Some recent data might be hidden    Patient Care Team: [name], NP as PCP - General (Family Medicine) Herminio Commons, MD as PCP - Cardiology (Cardiology) Herminio Commons, MD as Attending Physician (Cardiology)    Assessment:   This is a routine wellness examination for Billye.  Exercise Activities and Dietary recommendations Current Exercise Habits: The patient does not participate in regular exercise at present, Exercise limited by: orthopedic condition(s);respiratory conditions(s)  Goals    . Increase physical activity     Try some chair exercises to improve mobility     . Prevent falls     Always ambulate with help         Fall Risk Fall Risk  01/22/2020 01/16/2020 10/17/2019 02/05/2015  Falls in the past year? 0 1 1 Yes  Number falls in past yr: 1 1 1 2  or more  Injury with Fall? 0 1 1 Yes  Risk Factor Category  - - - High Fall Risk  Risk for fall due to : - - - History of fall(s);Impaired  balance/gait;Impaired vision;Impaired mobility  Follow up - - - Falls prevention discussed   Is the patient's home free of loose throw rugs in walkways, pet beds, electrical cords, etc?   yes      Grab bars in the bathroom? yes      Handrails on the stairs?   yes      Adequate lighting?   yes  Timed Get Up and Go performed: virtual visit   Depression Screen PHQ 2/9 Scores 01/22/2020 10/17/2019 02/05/2015  PHQ - 2  Score 4 0 1  PHQ- 9 Score 7 - -     Cognitive Function     6CIT Screen 01/22/2020  What Year? 0 points  What month? 0 points  What time? 0 points  Count back from 20 0 points  Months in reverse 4 points  Repeat phrase 2 points  Total Score 6    Immunization History  Administered Date(s) Administered  . Influenza Nasal 07/27/2019  . Influenza-Unspecified 07/05/2014, 07/06/2015, 06/05/2018  . Pneumococcal Conjugate-13 06/07/2018  . Pneumococcal Polysaccharide-23 07/27/2019    Qualifies for Shingles Vaccine? qualifies  Screening Tests Health Maintenance  Topic Date Due  . COVID-19 Vaccine (1) Never done  . DEXA SCAN  10/16/2020 (Originally 01/29/1992)  . TETANUS/TDAP  10/16/2020 (Originally 01/28/1944)  . INFLUENZA VACCINE  05/05/2020  . PNA vac Low Risk Adult  Completed    Cancer Screenings: Lung: Low Dose CT Chest recommended if Age 18-80 years, 30 pack-year currently smoking OR have quit w/in 15years. Patient does not qualify. Breast:  Up to date on Mammogram? Yes   Up to date of Bone Density/Dexa? Yes Colorectal: no longer needed  Additional Screenings: : Hepatitis C Screening: needed once     Plan:     I have personally reviewed and noted the following in the patient's chart:   . Medical and social history . Use of alcohol, tobacco or illicit drugs  . Current medications and supplements . Functional ability and status . Nutritional status . Physical activity . Advanced directives . List of other physicians . Hospitalizations, surgeries, and  ER visits in previous 12 months . Vitals . Screenings to include cognitive, depression, and falls . Referrals and appointments  In addition, I have reviewed and discussed with patient certain preventive protocols, quality metrics, and best practice recommendations. A written personalized care plan for preventive services as well as general preventive health recommendations were provided to patient.     Kate Sable, LPN, LPN  8/83/2549

## 2020-01-22 NOTE — Patient Instructions (Signed)
Kristie Morris , Thank you for taking time to come for your Medicare Wellness Visit. I appreciate your ongoing commitment to your health goals. Please review the following plan we discussed and let me know if I can assist you in the future.   Screening recommendations/referrals: Colonoscopy: no longer needed  Mammogram: no longer needed Bone Density: postponed until 2022 Recommended yearly ophthalmology/optometry visit for glaucoma screening and checkup Recommended yearly dental visit for hygiene and checkup  Vaccinations: Influenza vaccine: up to date  Pneumococcal vaccine: up to date  Tdap vaccine: up to date  Shingles vaccine: if wanted- per medicare must get at a pharmacy   Advanced directives: mailed form   Conditions/risks identified: fall risk   Next appointment: 1 year    Preventive Care 23 Years and Older, Female Preventive care refers to lifestyle choices and visits with your health care provider that can promote health and wellness. What does preventive care include?  A yearly physical exam. This is also called an annual well check.  Dental exams once or twice a year.  Routine eye exams. Ask your health care provider how often you should have your eyes checked.  Personal lifestyle choices, including:  Daily care of your teeth and gums.  Regular physical activity.  Eating a healthy diet.  Avoiding tobacco and drug use.  Limiting alcohol use.  Practicing safe sex.  Taking low-dose aspirin every day.  Taking vitamin and mineral supplements as recommended by your health care provider. What happens during an annual well check? The services and screenings done by your health care provider during your annual well check will depend on your age, overall health, lifestyle risk factors, and family history of disease. Counseling  Your health care provider may ask you questions about your:  Alcohol use.  Tobacco use.  Drug use.  Emotional well-being.  Home and  relationship well-being.  Sexual activity.  Eating habits.  History of falls.  Memory and ability to understand (cognition).  Work and work Statistician.  Reproductive health. Screening  You may have the following tests or measurements:  Height, weight, and BMI.  Blood pressure.  Lipid and cholesterol levels. These may be checked every 5 years, or more frequently if you are over 15 years old.  Skin check.  Lung cancer screening. You may have this screening every year starting at age 41 if you have a 30-pack-year history of smoking and currently smoke or have quit within the past 15 years.  Fecal occult blood test (FOBT) of the stool. You may have this test every year starting at age 39.  Flexible sigmoidoscopy or colonoscopy. You may have a sigmoidoscopy every 5 years or a colonoscopy every 10 years starting at age 54.  Hepatitis C blood test.  Hepatitis B blood test.  Sexually transmitted disease (STD) testing.  Diabetes screening. This is done by checking your blood sugar (glucose) after you have not eaten for a while (fasting). You may have this done every 1-3 years.  Bone density scan. This is done to screen for osteoporosis. You may have this done starting at age 36.  Mammogram. This may be done every 1-2 years. Talk to your health care provider about how often you should have regular mammograms. Talk with your health care provider about your test results, treatment options, and if necessary, the need for more tests. Vaccines  Your health care provider may recommend certain vaccines, such as:  Influenza vaccine. This is recommended every year.  Tetanus, diphtheria, and acellular pertussis (  Tdap, Td) vaccine. You may need a Td booster every 10 years.  Zoster vaccine. You may need this after age 61.  Pneumococcal 13-valent conjugate (PCV13) vaccine. One dose is recommended after age 64.  Pneumococcal polysaccharide (PPSV23) vaccine. One dose is recommended after  age 80. Talk to your health care provider about which screenings and vaccines you need and how often you need them. This information is not intended to replace advice given to you by your health care provider. Make sure you discuss any questions you have with your health care provider. Document Released: 10/18/2015 Document Revised: 06/10/2016 Document Reviewed: 07/23/2015 Elsevier Interactive Patient Education  2017 [employer] Prevention in the Home Falls can cause injuries. They can happen to people of all ages. There are many things you can do to make your home safe and to help prevent falls. What can I do on the outside of my home?  Regularly fix the edges of walkways and driveways and fix any cracks.  Remove anything that might make you trip as you walk through a door, such as a raised step or threshold.  Trim any bushes or trees on the path to your home.  Use bright outdoor lighting.  Clear any walking paths of anything that might make someone trip, such as rocks or tools.  Regularly check to see if handrails are loose or broken. Make sure that both sides of any steps have handrails.  Any raised decks and porches should have guardrails on the edges.  Have any leaves, snow, or ice cleared regularly.  Use sand or salt on walking paths during winter.  Clean up any spills in your garage right away. This includes oil or grease spills. What can I do in the bathroom?  Use night lights.  Install grab bars by the toilet and in the tub and shower. Do not use towel bars as grab bars.  Use non-skid mats or decals in the tub or shower.  If you need to sit down in the shower, use a plastic, non-slip stool.  Keep the floor dry. Clean up any water that spills on the floor as soon as it happens.  Remove soap buildup in the tub or shower regularly.  Attach bath mats securely with double-sided non-slip rug tape.  Do not have throw rugs and other things on the floor that can  make you trip. What can I do in the bedroom?  Use night lights.  Make sure that you have a light by your bed that is easy to reach.  Do not use any sheets or blankets that are too big for your bed. They should not hang down onto the floor.  Have a firm chair that has side arms. You can use this for support while you get dressed.  Do not have throw rugs and other things on the floor that can make you trip. What can I do in the kitchen?  Clean up any spills right away.  Avoid walking on wet floors.  Keep items that you use a lot in easy-to-reach places.  If you need to reach something above you, use a strong step stool that has a grab bar.  Keep electrical cords out of the way.  Do not use floor polish or wax that makes floors slippery. If you must use wax, use non-skid floor wax.  Do not have throw rugs and other things on the floor that can make you trip. What can I do with my stairs?  Do  not leave any items on the stairs.  Make sure that there are handrails on both sides of the stairs and use them. Fix handrails that are broken or loose. Make sure that handrails are as long as the stairways.  Check any carpeting to make sure that it is firmly attached to the stairs. Fix any carpet that is loose or worn.  Avoid having throw rugs at the top or bottom of the stairs. If you do have throw rugs, attach them to the floor with carpet tape.  Make sure that you have a light switch at the top of the stairs and the bottom of the stairs. If you do not have them, ask someone to add them for you. What else can I do to help prevent falls?  Wear shoes that:  Do not have high heels.  Have rubber bottoms.  Are comfortable and fit you well.  Are closed at the toe. Do not wear sandals.  If you use a stepladder:  Make sure that it is fully opened. Do not climb a closed stepladder.  Make sure that both sides of the stepladder are locked into place.  Ask someone to hold it for you,  if possible.  Clearly mark and make sure that you can see:  Any grab bars or handrails.  First and last steps.  Where the edge of each step is.  Use tools that help you move around (mobility aids) if they are needed. These include:  Canes.  Walkers.  Scooters.  Crutches.  Turn on the lights when you go into a dark area. Replace any light bulbs as soon as they burn out.  Set up your furniture so you have a clear path. Avoid moving your furniture around.  If any of your floors are uneven, fix them.  If there are any pets around you, be aware of where they are.  Review your medicines with your doctor. Some medicines can make you feel dizzy. This can increase your chance of falling. Ask your doctor what other things that you can do to help prevent falls. This information is not intended to replace advice given to you by your health care provider. Make sure you discuss any questions you have with your health care provider. Document Released: 07/18/2009 Document Revised: 02/27/2016 Document Reviewed: 10/26/2014 Elsevier Interactive Patient Education  2017 [employer].

## 2020-02-03 DIAGNOSIS — R296 Repeated falls: Secondary | ICD-10-CM | POA: Diagnosis not present

## 2020-02-03 DIAGNOSIS — E538 Deficiency of other specified B group vitamins: Secondary | ICD-10-CM | POA: Diagnosis not present

## 2020-02-03 DIAGNOSIS — R269 Unspecified abnormalities of gait and mobility: Secondary | ICD-10-CM | POA: Diagnosis not present

## 2020-02-03 DIAGNOSIS — M6289 Other specified disorders of muscle: Secondary | ICD-10-CM | POA: Diagnosis not present

## 2020-02-05 ENCOUNTER — Other Ambulatory Visit: Payer: Self-pay | Admitting: *Deleted

## 2020-02-05 ENCOUNTER — Other Ambulatory Visit: Payer: Self-pay | Admitting: Family Medicine

## 2020-02-05 DIAGNOSIS — E785 Hyperlipidemia, unspecified: Secondary | ICD-10-CM

## 2020-02-14 ENCOUNTER — Other Ambulatory Visit: Payer: Self-pay | Admitting: *Deleted

## 2020-02-14 MED ORDER — OXYBUTYNIN CHLORIDE 5 MG PO TABS: 5.0000 mg | ORAL_TABLET | Freq: Two times a day (BID) | ORAL | 3 refills | Status: DC

## 2020-02-14 MED ORDER — MELOXICAM 7.5 MG PO TABS: 7.5000 mg | ORAL_TABLET | Freq: Two times a day (BID) | ORAL | 3 refills | Status: DC

## 2020-02-21 ENCOUNTER — Encounter (HOSPITAL_COMMUNITY): Payer: Self-pay | Admitting: Emergency Medicine

## 2020-02-21 ENCOUNTER — Telehealth: Payer: Self-pay

## 2020-02-21 ENCOUNTER — Other Ambulatory Visit: Payer: Self-pay

## 2020-02-21 ENCOUNTER — Emergency Department (HOSPITAL_COMMUNITY): Payer: Medicare HMO

## 2020-02-21 ENCOUNTER — Encounter: Payer: Self-pay | Admitting: Family Medicine

## 2020-02-21 ENCOUNTER — Ambulatory Visit (INDEPENDENT_AMBULATORY_CARE_PROVIDER_SITE_OTHER): Payer: Medicare HMO | Admitting: Family Medicine

## 2020-02-21 ENCOUNTER — Emergency Department (HOSPITAL_COMMUNITY)
Admission: EM | Admit: 2020-02-21 | Discharge: 2020-02-21 | Disposition: A | Discharge status: Home / Self Care | Payer: Medicare HMO | Attending: Emergency Medicine | Admitting: Emergency Medicine

## 2020-02-21 VITALS — BP 142/86 | HR 109 | Temp 97.3°F | Ht 64.0 in | Wt 184.0 lb

## 2020-02-21 DIAGNOSIS — Y92012 Bathroom of single-family (private) house as the place of occurrence of the external cause: Secondary | ICD-10-CM | POA: Diagnosis not present

## 2020-02-21 DIAGNOSIS — I5032 Chronic diastolic (congestive) heart failure: Secondary | ICD-10-CM | POA: Insufficient documentation

## 2020-02-21 DIAGNOSIS — S40011A Contusion of right shoulder, initial encounter: Secondary | ICD-10-CM | POA: Diagnosis not present

## 2020-02-21 DIAGNOSIS — W1812XA Fall from or off toilet with subsequent striking against object, initial encounter: Secondary | ICD-10-CM | POA: Diagnosis not present

## 2020-02-21 DIAGNOSIS — R590 Localized enlarged lymph nodes: Secondary | ICD-10-CM | POA: Diagnosis not present

## 2020-02-21 DIAGNOSIS — Z79899 Other long term (current) drug therapy: Secondary | ICD-10-CM | POA: Diagnosis not present

## 2020-02-21 DIAGNOSIS — J449 Chronic obstructive pulmonary disease, unspecified: Secondary | ICD-10-CM | POA: Insufficient documentation

## 2020-02-21 DIAGNOSIS — J45909 Unspecified asthma, uncomplicated: Secondary | ICD-10-CM | POA: Diagnosis not present

## 2020-02-21 DIAGNOSIS — Z20822 Contact with and (suspected) exposure to covid-19: Secondary | ICD-10-CM | POA: Diagnosis not present

## 2020-02-21 DIAGNOSIS — Z7982 Long term (current) use of aspirin: Secondary | ICD-10-CM | POA: Insufficient documentation

## 2020-02-21 DIAGNOSIS — W19XXXA Unspecified fall, initial encounter: Secondary | ICD-10-CM | POA: Diagnosis not present

## 2020-02-21 DIAGNOSIS — Z87891 Personal history of nicotine dependence: Secondary | ICD-10-CM | POA: Diagnosis not present

## 2020-02-21 DIAGNOSIS — Y998 Other external cause status: Secondary | ICD-10-CM | POA: Insufficient documentation

## 2020-02-21 DIAGNOSIS — M542 Cervicalgia: Secondary | ICD-10-CM | POA: Diagnosis not present

## 2020-02-21 DIAGNOSIS — Y9389 Activity, other specified: Secondary | ICD-10-CM | POA: Diagnosis not present

## 2020-02-21 DIAGNOSIS — R0902 Hypoxemia: Secondary | ICD-10-CM

## 2020-02-21 DIAGNOSIS — Z8673 Personal history of transient ischemic attack (TIA), and cerebral infarction without residual deficits: Secondary | ICD-10-CM | POA: Insufficient documentation

## 2020-02-21 DIAGNOSIS — S0990XA Unspecified injury of head, initial encounter: Secondary | ICD-10-CM | POA: Diagnosis not present

## 2020-02-21 DIAGNOSIS — I110 Hypertensive heart disease with heart failure: Secondary | ICD-10-CM | POA: Insufficient documentation

## 2020-02-21 DIAGNOSIS — I10 Essential (primary) hypertension: Secondary | ICD-10-CM | POA: Diagnosis not present

## 2020-02-21 DIAGNOSIS — I313 Pericardial effusion (noninflammatory): Secondary | ICD-10-CM | POA: Insufficient documentation

## 2020-02-21 DIAGNOSIS — S4991XA Unspecified injury of right shoulder and upper arm, initial encounter: Secondary | ICD-10-CM | POA: Diagnosis not present

## 2020-02-21 DIAGNOSIS — R0602 Shortness of breath: Secondary | ICD-10-CM | POA: Diagnosis not present

## 2020-02-21 DIAGNOSIS — R591 Generalized enlarged lymph nodes: Secondary | ICD-10-CM | POA: Diagnosis not present

## 2020-02-21 DIAGNOSIS — I2510 Atherosclerotic heart disease of native coronary artery without angina pectoris: Secondary | ICD-10-CM | POA: Diagnosis not present

## 2020-02-21 DIAGNOSIS — M25511 Pain in right shoulder: Secondary | ICD-10-CM | POA: Diagnosis not present

## 2020-02-21 DIAGNOSIS — R918 Other nonspecific abnormal finding of lung field: Secondary | ICD-10-CM | POA: Diagnosis not present

## 2020-02-21 DIAGNOSIS — I3139 Other pericardial effusion (noninflammatory): Secondary | ICD-10-CM

## 2020-02-21 DIAGNOSIS — M25519 Pain in unspecified shoulder: Secondary | ICD-10-CM | POA: Diagnosis not present

## 2020-02-21 LAB — CBC WITH DIFFERENTIAL/PLATELET
Abs Immature Granulocytes: 0.02 10*3/uL (ref 0.00–0.07)
Basophils Absolute: 0 10*3/uL (ref 0.0–0.1)
Basophils Relative: 0 %
Eosinophils Absolute: 0.8 10*3/uL — ABNORMAL HIGH (ref 0.0–0.5)
Eosinophils Relative: 8 %
HCT: 38.1 % (ref 36.0–46.0)
Hemoglobin: 11.6 g/dL — ABNORMAL LOW (ref 12.0–15.0)
Immature Granulocytes: 0 %
Lymphocytes Relative: 12 %
Lymphs Abs: 1.1 10*3/uL (ref 0.7–4.0)
MCH: 27 pg (ref 26.0–34.0)
MCHC: 30.4 g/dL (ref 30.0–36.0)
MCV: 88.8 fL (ref 80.0–100.0)
Monocytes Absolute: 0.9 10*3/uL (ref 0.1–1.0)
Monocytes Relative: 10 %
Neutro Abs: 6.5 10*3/uL (ref 1.7–7.7)
Neutrophils Relative %: 70 %
Platelets: 321 10*3/uL (ref 150–400)
RBC: 4.29 MIL/uL (ref 3.87–5.11)
RDW: 14.3 % (ref 11.5–15.5)
WBC: 9.3 10*3/uL (ref 4.0–10.5)
nRBC: 0 % (ref 0.0–0.2)

## 2020-02-21 LAB — BASIC METABOLIC PANEL
Anion gap: 9 (ref 5–15)
BUN: 35 mg/dL — ABNORMAL HIGH (ref 8–23)
CO2: 31 mmol/L (ref 22–32)
Calcium: 11.2 mg/dL — ABNORMAL HIGH (ref 8.9–10.3)
Chloride: 100 mmol/L (ref 98–111)
Creatinine, Ser: 1.44 mg/dL — ABNORMAL HIGH (ref 0.44–1.00)
GFR calc Af Amer: 36 mL/min — ABNORMAL LOW (ref >60)
GFR calc non Af Amer: 31 mL/min — ABNORMAL LOW (ref >60)
Glucose, Bld: 112 mg/dL — ABNORMAL HIGH (ref 70–99)
Potassium: 4 mmol/L (ref 3.5–5.1)
Sodium: 140 mmol/L (ref 135–145)

## 2020-02-21 LAB — D-DIMER, QUANTITATIVE: D-Dimer, Quant: 1.3 ug/mL-FEU — ABNORMAL HIGH (ref 0.00–0.50)

## 2020-02-21 LAB — BRAIN NATRIURETIC PEPTIDE: B Natriuretic Peptide: 21 pg/mL (ref 0.0–100.0)

## 2020-02-21 LAB — SARS CORONAVIRUS 2 BY RT PCR (HOSPITAL ORDER, PERFORMED IN ~~LOC~~ HOSPITAL LAB): SARS Coronavirus 2: NEGATIVE

## 2020-02-21 MED ORDER — HYDROCODONE-ACETAMINOPHEN 5-325 MG PO TABS: 1.0000 | ORAL_TABLET | ORAL | 0 refills | Status: DC | PRN

## 2020-02-21 MED ORDER — FENTANYL CITRATE (PF) 100 MCG/2ML IJ SOLN
50.0000 ug | Freq: Once | INTRAMUSCULAR | Status: AC
  Administered 2020-02-21: 50 ug via INTRAVENOUS
  Filled 2020-02-21: qty 2

## 2020-02-21 MED ORDER — IOHEXOL 350 MG/ML SOLN
75.0000 mL | Freq: Once | INTRAVENOUS | Status: AC | PRN
  Administered 2020-02-21: 75 mL via INTRAVENOUS

## 2020-02-21 NOTE — ED Triage Notes (Addendum)
PT states she fell off the toilet last night and fell to the floor hitting the right side of her head and right shoulder. Deformity/swelling noted to right shoulder upon arrival. PT unsure if she lost consciousness or not.

## 2020-02-21 NOTE — Telephone Encounter (Signed)
Pt is calling, she fell yesterday, bumped her head, shoulder, EMT came out, pt didn't want to got to the ER and sit, nor take a ride, as the Ambulance is to expensive for her.  She is complaining of shoulder pain, neck & Head pain.  I suggested they go to the ER, however they want just Xrays ordered. I offered an appt at 2pm today, but they are not sure they have a ride

## 2020-02-21 NOTE — ED Notes (Signed)
Pt. With CT. 

## 2020-02-21 NOTE — Discharge Instructions (Addendum)
Take the medications as needed for pain.  Wear the sling for comfort.  Ice to help with the swelling. Follow up with an orthopedic doctor to make sure your shoulder is healing properly.   Follow up with your primary care doctor to discuss the abnormal findings noted on your CT scan.    Be careful with the pain medication.  It can cause drowsiness, sleepiness and constipation.

## 2020-02-21 NOTE — ED Provider Notes (Signed)
[facility] Provider Note   CSN: [id number] Arrival date & time: 02/21/20  1447     History Chief Complaint  Patient presents with  . Shoulder Injury    Kristie Morris is a 86 y.o. female.  HPI   Patient presents to the emergency room with complaints of shoulder pain.  Patient states she had an episode last evening where she fell off the toilet and hit her head and right shoulder.  Patient is not sure if she lost consciousness.  She denies having any significant headache right now.  No nausea or vomiting.  Patient is having pain and swelling in her right shoulder.  Patient denies any chest pain or shortness of breath.  She denies any abdominal pain vomiting or diarrhea.  No fevers or chills.  No focal numbness or weakness.  Past Medical History:  Diagnosis Date  . Anemia 02/02/2014  . Anxiety   . Arthritis    knee  . Asthma   . CAD (coronary artery disease) cardiologist-  dr Bronson Ing  ([facility] in [place])   2003  Cath with nonobstructive CAD  . Chest pain 08/14/2017  . Chronic headache   . Chronic lower back pain   . COPD (chronic obstructive pulmonary disease) ([place])   . Diastolic CHF, chronic ([place])   . Duodenal diverticulum   . Edema of left lower extremity   . Fibromyalgia   . Full dentures   . Gait disorder    uses walker  . GERD (gastroesophageal reflux disease)   . Headache disorder 01/17/2015  . Heart murmur   . History of breast cancer   . History of Clostridium difficile   . History of colitis    2001  . History of CVA with residual deficit    06/ 2015  residual left hemiparesis  . History of esophageal dilatation   . History of falling    last fall 06-25-2015  . History of hiatal hernia   . History of small bowel obstruction    04-25-2015  resolved without surgical intervention  . Lesion of bladder   . Low vision, one eye    right eye  . Macular degeneration of both eyes    right eye now low vision/  left eye getting  injections currently  . Malignant hypertension   . OAB (overactive bladder)   . Protein-calorie malnutrition, severe ([place]) 01/10/2015  . SBO (small bowel obstruction) ([place]) 04/22/2015  . Schatzki's ring   . Sciatica of right side 05/29/2015  . Syncope 08/14/2017  . Urge and stress incontinence   . Varicose veins   . Venous insufficiency, peripheral   . Vertigo     Patient Active Problem List   Diagnosis Date Noted  . Primary osteoarthritis 01/17/2020  . Post-menopausal 10/24/2019  . Wheelchair bound 10/24/2019  . Overactive bladder 10/17/2019  . Sciatica of right side 05/29/2015  . Essential hypertension 04/27/2015  . Personal history of stroke with current residual effects 04/27/2015  . Abnormality of gait 01/08/2015  . Recurrent falls 01/08/2015  . Pre-syncope 01/08/2015  . Left-sided weakness 04/02/2014  . Diastolic dysfunction 40/97/3532  . Asthma, chronic 02/02/2014  . COPD (chronic obstructive pulmonary disease) ([place]) 02/02/2014  . Hyperlipidemia 02/02/2014    Past Surgical History:  Procedure Laterality Date  . [place]   from colon  . ABDOMINAL HYSTERECTOMY  1954   w/ bilateral salpingoophorectomy  . ANTERIOR CERVICAL DECOMP/DISCECTOMY FUSION  1971   "used bone off  of my right hip"  . BREAST LUMPECTOMY Left 2006  . CARDIAC CATHETERIZATION  11-29-2001  dr Tressia Miners turner   Non-obstructive CAD/  20% pLAD,  30% pD1, 50% mRCA/  normal LVF  . CATARACT EXTRACTION W/ INTRAOCULAR LENS  IMPLANT, BILATERAL  right 2008//  left 2015  . CYSTOSCOPY WITH HYDRODISTENSION AND BIOPSY N/A 07/23/2015   Procedure: CYSTOSCOPY;HYDRODISTENSION;  Surgeon: Irine Seal, MD;  Location: [facility];  Service: Urology;  Laterality: N/A;  . DILATION AND CURETTAGE OF UTERUS  1953    miscarriage  . [place]  . KNEE ARTHROSCOPY Right 2005  . MASTECTOMY, PARTIAL Right 1999  . OVARIAN CYST REMOVAL  1946  . PATELLA FRACTURE SURGERY Left 2008  .  TRANSTHORACIC ECHOCARDIOGRAM  01-09-2015   mild LVH,  ef 99-37%, grade I diastolic dysfunction/  mild MR/  trivial pericardial effusion was identified     OB History   No obstetric history on file.     Family History  Problem Relation Age of Onset  . Heart attack Mother   . CAD Mother   . Polycythemia Mother   . Heart attack Sister   . Cancer - Ovarian Sister   . Cancer - Colon Father   . Macular degeneration Sister   . Thyroid disease Daughter   . Rheumatic fever Daughter     Social History   Tobacco Use  . Smoking status: Former Smoker    Packs/day: 0.50    Years: 15.00    Pack years: 7.50    Types: Cigarettes    Quit date: 07/17/1987    Years since quitting: 32.6  . Smokeless tobacco: Never Used  Substance Use Topics  . Alcohol use: Yes    Alcohol/week: 1.0 standard drinks    Types: 1 Glasses of wine per week    Comment: rarely  . Drug use: No    Home Medications Prior to Admission medications   Medication Sig Start Date End Date Taking? Authorizing Provider  acetaminophen (TYLENOL) 500 MG tablet Take 1,000 mg daily as needed by mouth for mild pain or moderate pain.    [provider]  albuterol (VENTOLIN HFA) 108 (90 Base) MCG/ACT inhaler Inhale 2 puffs into the lungs every 6 (six) hours as needed for wheezing or shortness of breath. 01/16/20   [name], NP  amLODipine (NORVASC) 10 MG tablet Take 10 mg by mouth daily. 09/18/19   [provider]  aspirin EC 81 MG tablet Take 81 mg by mouth daily.    [provider]  clotrimazole-betamethasone (LOTRISONE) cream Apply 1 application topically 2 (two) times daily. 10/24/19   [name], NP  DULoxetine (CYMBALTA) 30 MG capsule Take 30 mg by mouth daily. 09/18/19   [provider]  DULoxetine (CYMBALTA) 60 MG capsule Take 1 capsule (60 mg total) by mouth daily. 01/22/20   [name], NP  gabapentin (NEURONTIN) 100 MG capsule TAKE (1) CAPSULE BY MOUTH THREE TIMES DAILY  11/12/15   Kathrynn Ducking, MD  HYDROcodone-acetaminophen (NORCO/VICODIN) 5-325 MG tablet Take 1 tablet by mouth every 4 (four) hours as needed. 02/21/20   Dorie Rank, MD  meloxicam (MOBIC) 7.5 MG tablet Take 1 tablet (7.5 mg total) by mouth 2 (two) times daily. 02/14/20   [name], NP  nitroGLYCERIN (NITROSTAT) 0.4 MG SL tablet Place 0.4 mg every 5 (five) minutes as needed under the tongue.  09/10/15   [provider]  oxybutynin (DITROPAN) 5 MG tablet  Take 1 tablet (5 mg total) by mouth 2 (two) times daily. 02/14/20   [name], NP  pantoprazole (PROTONIX) 40 MG tablet Take 1 tablet (40 mg total) by mouth daily. 12/11/19   [name], NP  polyethylene glycol powder (GLYCOLAX/MIRALAX) powder Take 17 g daily as needed by mouth for mild constipation.    [provider]  Probiotic Product (ALIGN PO) Take 1 capsule by mouth daily.    [provider]  rosuvastatin (CRESTOR) 5 MG tablet TAKE 1 TABLET BY MOUTH ONCE DAILY. 02/05/20   [name], NP  traZODone (DESYREL) 50 MG tablet Take 50 mg by mouth at bedtime. 09/18/19   [provider]  UNABLE TO [facility] Bed 10/18/19   [name], NP  UNABLE TO FIND Transfer Lift 10/18/19   [name], NP    Allergies    Zithromax [azithromycin], Celebrex [celecoxib], Ciprofloxacin, Metronidazole, Morphine and related, Moxifloxacin, Penicillins, Polymyxin b, Sulfa antibiotics, Vancomycin, and Vioxx [rofecoxib]  Review of Systems   Review of Systems  All other systems reviewed and are negative.   Physical Exam Updated Vital Signs BP (!) 136/106   Pulse (!) 42   Temp 98.4 F (36.9 C) (Oral)   Resp (!) 21   Ht 1.626 m (5\' 4" )   Wt 83.5 kg   SpO2 94%   BMI 31.58 kg/m   Physical Exam Vitals and nursing note reviewed.  Constitutional:      General: She is not in acute distress.    Appearance: She is well-developed.  HENT:     Head: Normocephalic and atraumatic.     Right Ear: External  ear normal.     Left Ear: External ear normal.  Eyes:     General: No scleral icterus.       Right eye: No discharge.        Left eye: No discharge.     Conjunctiva/sclera: Conjunctivae normal.  Neck:     Trachea: No tracheal deviation.  Cardiovascular:     Rate and Rhythm: Normal rate and regular rhythm.  Pulmonary:     Effort: Pulmonary effort is normal. No respiratory distress.     Breath sounds: Normal breath sounds. No stridor. No wheezing or rales.  Abdominal:     General: Bowel sounds are normal. There is no distension.     Palpations: Abdomen is soft.     Tenderness: There is no abdominal tenderness. There is no guarding or rebound.  Musculoskeletal:        General: Tenderness present.     Right shoulder: Swelling and tenderness present. Decreased range of motion.     Cervical back: Neck supple.     Comments: No tenderness palpation bilateral hips, no tenderness to palpation cervical thoracic or lumbar spine,  Skin:    General: Skin is warm and dry.     Findings: No rash.  Neurological:     Mental Status: She is alert.     Cranial Nerves: No cranial nerve deficit (no facial droop, extraocular movements intact, no slurred speech).     Sensory: No sensory deficit.     Motor: No abnormal muscle tone or seizure activity.     Coordination: Coordination normal.     Comments: , Facial droop, equal strength and sensation bilateral upper and lower extremities     ED Results / Procedures / Treatments   Labs (all labs ordered are listed, but only abnormal results are displayed) Labs Reviewed  CBC  WITH DIFFERENTIAL/PLATELET - Abnormal; Notable for the following components:      Result Value   Hemoglobin 11.6 (*)    Eosinophils Absolute 0.8 (*)    All other components within normal limits  BASIC METABOLIC PANEL - Abnormal; Notable for the following components:   Glucose, Bld 112 (*)    BUN 35 (*)    Creatinine, Ser 1.44 (*)    Calcium 11.2 (*)    GFR calc non Af Amer 31  (*)    GFR calc Af Amer 36 (*)    All other components within normal limits  D-DIMER, QUANTITATIVE (NOT AT [facility]) - Abnormal; Notable for the following components:   D-Dimer, Quant 1.30 (*)    All other components within normal limits  SARS CORONAVIRUS 2 BY RT PCR (HOSPITAL ORDER, [facility])  BRAIN NATRIURETIC PEPTIDE    EKG EKG Interpretation  Date/Time:  Wednesday Feb 21 2020 15:10:07 EDT Ventricular Rate:  97 PR Interval:    QRS Duration: 77 QT Interval:  341 QTC Calculation: 434 R Axis:   -48 Text Interpretation: Sinus rhythm Left anterior fascicular block Low voltage, extremity and precordial leads No significant change since last tracing Confirmed by Dorie Rank [phone]) on 02/21/2020 3:18:27 PM   Radiology CT Head Wo Contrast  Result Date: 02/21/2020 CLINICAL DATA:  [place] off toilet, hit right-side of head EXAM: CT HEAD WITHOUT CONTRAST TECHNIQUE: Contiguous axial images were obtained from the base of the skull through the vertex without intravenous contrast. COMPARISON:  09/01/2017 FINDINGS: Brain: Chronic confluent hypodensities throughout the periventricular white matter consistent with chronic small vessel ischemic change. No acute infarct or hemorrhage. Stable diffuse cerebral atrophy with ex vacuo dilatation of the lateral ventricles. No acute extra-axial fluid collections. No mass effect. Vascular: No hyperdense vessel or unexpected calcification. Skull: Normal. Negative for fracture or focal lesion. Sinuses/Orbits: Minimal fluid within the sphenoid sinus. Otherwise the sinuses are clear. Other: None. IMPRESSION: 1. Stable chronic small vessel ischemic changes. 2. No acute intracranial process. Electronically Signed   By: Randa Ngo M.D.   On: 02/21/2020 16:49   CT Angio Chest PE W and/or Wo Contrast  Result Date: 02/21/2020 CLINICAL DATA:  86 year old shortness of breath. Shoulder, neck and head pain. Hypoxia. EXAM: CT ANGIOGRAPHY CHEST WITH  CONTRAST TECHNIQUE: Multidetector CT imaging of the chest was performed using the standard protocol during bolus administration of intravenous contrast. Multiplanar CT image reconstructions and MIPs were obtained to evaluate the vascular anatomy. CONTRAST:  29mL OMNIPAQUE IOHEXOL 350 MG/ML SOLN, patient received reduced dose for GFR of 31 COMPARISON:  Radiograph earlier this day FINDINGS: Cardiovascular: Basilar and subsegmental evaluation is limited given breathing motion artifact. Allowing for this, there are no filling defects within the pulmonary arteries to suggest pulmonary embolus. Dilated main pulmonary artery at 3.6 cm. Diffuse calcified noncalcified atheromatous plaque throughout the thoracic aorta without dissection. Multi chamber cardiomegaly. Moderate circumferential pericardial effusion measuring up to 13 mm in depth. Mediastinum/Nodes: Abnormal mediastinal and bilateral hilar adenopathy, left more prominent than right. Left hilar nodes near circumferential about the left lower lobe pulmonary artery, spanning at least 2.2 cm, series 7, image 39. There are enlarged subcarinal nodes, however difficult to separate from the adjacent esophagus, nodes in the soft ago S span approximately 2.2 cm. Lower paratracheal nodes measuring up to 17 mm short axis, series 7, image 27. There also prominent upper paratracheal nodes. Right hilar adenopathy measuring 12 and 13 mm. Esophagus is difficult to delineate from  adjacent subcarinal adenopathy, but is otherwise grossly normal. The thyroid gland is heterogeneously enlarged with hypodense nodules bilaterally. Nodule in the left measures up to 17 mm. Surgical clips in the both axilla. No right axillary adenopathy. There is scattered prominent left axillary nodes largest measuring up to 10 mm, series 7, image 18. Lungs/Pleura: Streaky and linear opacities within both dependent lower lobes, periphery of the left upper lobe, and anterior right upper lobe. No pulmonary  mass or suspicious nodule. Mild heterogeneous pulmonary parenchyma suggesting underlying small airways disease. There is mass effect on the lingular bronchus from left hilar adenopathy. No significant pleural effusion. Upper Abdomen: Left renal atrophy. Distended gallbladder. Irregular plaque in the upper abdominal aorta. Thickening of the left adrenal gland. No dominant adrenal nodule. No evidence of focal hepatic lesion allowing for arterial phase imaging. Musculoskeletal: No blastic osseous lesions. The bones are diffusely under mineralized which limits assessment for lytic lesion. No evidence of bony destruction. Prior right mastectomy. Postsurgical change in the left breast. Review of the MIP images confirms the above findings. IMPRESSION: 1. No pulmonary embolus. Dilated main pulmonary artery suggesting pulmonary arterial hypertension. 2. Abnormal mediastinal and bilateral hilar adenopathy, left more prominent than right. Left hilar adenopathy is near circumferential about the left lower lobe pulmonary artery. There is also a prominent 10 mm left axillary node. Findings suspicious for neoplasm; lymphoproliferative disorder such as lymphoma or metastatic disease from unknown primary. The subcarinal nodes are difficult to delineate from the adjacent esophagus, and esophageal lesion is difficult to exclude. Consider referral to thoracic interdisciplinary conference for further workup and management. 3. No pulmonary mass or suspicious nodule. 4. Streaky and linear opacities in both dependent lower lobes, left upper lobe, and anterior right upper lobe, favor atelectasis. 5. Moderate circumferential pericardial effusion. 6. Heterogeneously enlarged thyroid gland with hypodense nodules bilaterally. In the setting of significant comorbidities or limited life expectancy, no follow-up recommended. (ref: J Am Coll Radiol. 2015 Feb;12(2): 143-50). 7. Incidental note of distended gallbladder in the upper abdomen, partially  included. Aortic Atherosclerosis (ICD10-I70.0). Electronically Signed   By: Keith Rake M.D.   On: 02/21/2020 18:49   DG Chest Port 1 View  Result Date: 02/21/2020 CLINICAL DATA:  Hypoxia. EXAM: PORTABLE CHEST 1 VIEW COMPARISON:  08/24/2018 FINDINGS: Patient is slightly rotated to the left. There is mild hazy opacification over the left lung base likely effusion with associated atelectasis. Infection in the left base is possible. Right lung is clear. Borderline stable cardiomegaly. Remainder of the exam is unchanged. IMPRESSION: Left base opacification likely small effusion with associated atelectasis. Infection in the left base is possible. Electronically Signed   By: Marin Olp M.D.   On: 02/21/2020 15:47   DG Shoulder Right Portable  Result Date: 02/21/2020 CLINICAL DATA:  Fall off toilet last night with right shoulder pain. EXAM: PORTABLE RIGHT SHOULDER COMPARISON:  None. FINDINGS: Diffuse decreased bone mineralization. Mild degenerative change over the [facility] joint and glenohumeral joints. No evidence of acute fracture or dislocation. Surgical clips over the right axilla. IMPRESSION: No acute findings. Electronically Signed   By: Marin Olp M.D.   On: 02/21/2020 15:49    Procedures Procedures (including critical care time)  Medications Ordered in ED Medications  fentaNYL (SUBLIMAZE) injection 50 mcg (50 mcg Intravenous Given 02/21/20 1652)  iohexol (OMNIPAQUE) 350 MG/ML injection 75 mL (75 mLs Intravenous Contrast Given 02/21/20 1813)    ED Course  I have reviewed the triage vital signs and the nursing notes.  Pertinent labs &  imaging results that were available during my care of the patient were reviewed by me and considered in my medical decision making (see chart for details).  Clinical Course as of Feb 20 1925  Wed Feb 21, 2020  1715 Stable.  No change compared to prior  CBC with Differential/Platelet(!) [JK]  1716 RBC: 4.29 [JK]  1716 Renal function stable  Basic  metabolic panel(!) [JK]  6767 D dimer elevated.  Covid negative   [JK]    Clinical Course User Index [JK] Dorie Rank, MD   MDM Rules/Calculators/A&P                      The primary reason the patient came to the ED was for evaluation of shoulder pain.  Patient's not sure if she may have had a syncopal episode associated with her fall injury.  On exam the patient clearly had significant bruising around her right shoulder.  X-rays do not show any signs of fracture or dislocation.  Plan will be for pain management.  Regarding her head injury the CAT scans normal.  No signs of significant intracranial injury associated with her fall.  Patient does not have any focal neurologic deficits to suggest stroke.  Symptoms are not suggestive of TIA.  Patient was noted to have decreased O2 sats when she first arrived.  Patient does have history of COPD.  Laboratory tests were checked to assess for possibility of pneumonia, congestive heart failure or PE.  She did have an elevated D-dimer.  CT angiogram was performed.  Does not show PE but does demonstrate mediastinal adenopathy concerning for malignancy as well as pericardial effusion.  Patient feels comfortable with discharge.  Family members are available to help care for her.  I will give her a short course of hydrocodone so she has something available for pain.  Warning signs precautions of the hydrocodone were discussed.  The abnormal chest CT findings were also discussed with family.  I discussed that these findings could be related to an occult cancer.  Patient has had breast cancer in the past.  Discussed outpatient follow-up with the primary care doctor to discuss further treatment and/or referral Final Clinical Impression(s) / ED Diagnoses Final diagnoses:  Mediastinal lymphadenopathy  Pericardial effusion  Contusion of right shoulder, initial encounter    Rx / DC Orders ED Discharge Orders         Ordered    HYDROcodone-acetaminophen  (NORCO/VICODIN) 5-325 MG tablet  Every 4 hours PRN     02/21/20 Johney Maine, MD 02/21/20 1928

## 2020-02-21 NOTE — Telephone Encounter (Signed)
Pt coming in at 2pm for an appt today 02-21-20

## 2020-02-22 ENCOUNTER — Encounter: Payer: Self-pay | Admitting: Family Medicine

## 2020-02-22 DIAGNOSIS — W19XXXA Unspecified fall, initial encounter: Secondary | ICD-10-CM | POA: Insufficient documentation

## 2020-02-22 NOTE — Assessment & Plan Note (Signed)
Patient was sent to the emergency room for assessment.  As PE demonstrated her inability to raise her right arm.  Tenderness to touch swelling in the soft tissue.  As well as low oxygenation.

## 2020-02-22 NOTE — Progress Notes (Signed)
Subjective:  Patient ID: Kristie Morris, female    DOB: 07-03-1925  Age: 86 y.o. MRN: [id number]  CC:  Chief Complaint  Patient presents with  . Fall      HPI  HPI   Ms. Thurmond is a 86 year old patient of mine.  Who presents today after having a fall on the 18th.  She fell in the bathroom while sitting on the toilet and leaned over.  This was about 6 PM she bumped her head but she did not lose consciousness that she was aware of.  She went down on her shoulder and neck and they have been hurting ever since.  Pain level 8 out of 10.  Inability to lift up her arm raise it without discomfort.  She reports taking Tylenol but nothing else.  Today patient denies signs and symptoms of COVID 19 infection including fever, chills, cough, shortness of breath, and headache. Past Medical, Surgical, Social History, Allergies, and Medications have been Reviewed.   Past Medical History:  Diagnosis Date  . Anemia 02/02/2014  . Anxiety   . Arthritis    knee  . Asthma   . CAD (coronary artery disease) cardiologist-  dr Bronson Ing  ([facility] in [place])   2003  Cath with nonobstructive CAD  . Chest pain 08/14/2017  . Chronic headache   . Chronic lower back pain   . COPD (chronic obstructive pulmonary disease) ([place])   . Diastolic CHF, chronic ([place])   . Duodenal diverticulum   . Edema of left lower extremity   . Fibromyalgia   . Full dentures   . Gait disorder    uses walker  . GERD (gastroesophageal reflux disease)   . Headache disorder 01/17/2015  . Heart murmur   . History of breast cancer   . History of Clostridium difficile   . History of colitis    2001  . History of CVA with residual deficit    06/ 2015  residual left hemiparesis  . History of esophageal dilatation   . History of falling    last fall 06-25-2015  . History of hiatal hernia   . History of small bowel obstruction    04-25-2015  resolved without surgical intervention  . Lesion of bladder   . Low  vision, one eye    right eye  . Macular degeneration of both eyes    right eye now low vision/  left eye getting injections currently  . Malignant hypertension   . OAB (overactive bladder)   . Protein-calorie malnutrition, severe ([place]) 01/10/2015  . SBO (small bowel obstruction) ([place]) 04/22/2015  . Schatzki's ring   . Sciatica of right side 05/29/2015  . Syncope 08/14/2017  . Urge and stress incontinence   . Varicose veins   . Venous insufficiency, peripheral   . Vertigo     Current Meds  Medication Sig  . acetaminophen (TYLENOL) 500 MG tablet Take 1,000 mg daily as needed by mouth for mild pain or moderate pain.  Marland Kitchen albuterol (VENTOLIN HFA) 108 (90 Base) MCG/ACT inhaler Inhale 2 puffs into the lungs every 6 (six) hours as needed for wheezing or shortness of breath.  Marland Kitchen amLODipine (NORVASC) 10 MG tablet Take 10 mg by mouth daily.  Marland Kitchen aspirin EC 81 MG tablet Take 81 mg by mouth daily.  . clotrimazole-betamethasone (LOTRISONE) cream Apply 1 application topically 2 (two) times daily.  . DULoxetine (CYMBALTA) 30 MG capsule Take 30 mg by mouth daily.  . DULoxetine (CYMBALTA) 60  MG capsule Take 1 capsule (60 mg total) by mouth daily.  Marland Kitchen gabapentin (NEURONTIN) 100 MG capsule TAKE (1) CAPSULE BY MOUTH THREE TIMES DAILY  . meloxicam (MOBIC) 7.5 MG tablet Take 1 tablet (7.5 mg total) by mouth 2 (two) times daily.  . nitroGLYCERIN (NITROSTAT) 0.4 MG SL tablet Place 0.4 mg every 5 (five) minutes as needed under the tongue.   Marland Kitchen oxybutynin (DITROPAN) 5 MG tablet Take 1 tablet (5 mg total) by mouth 2 (two) times daily.  . pantoprazole (PROTONIX) 40 MG tablet Take 1 tablet (40 mg total) by mouth daily.  . polyethylene glycol powder (GLYCOLAX/MIRALAX) powder Take 17 g daily as needed by mouth for mild constipation.  . Probiotic Product (ALIGN PO) Take 1 capsule by mouth daily.  . rosuvastatin (CRESTOR) 5 MG tablet TAKE 1 TABLET BY MOUTH ONCE DAILY.  . traZODone (DESYREL) 50 MG tablet Take 50 mg by mouth at  bedtime.  Marland Kitchen UNABLE TO [facility] Bed  . UNABLE TO FIND Transfer Lift    ROS:  Review of Systems  HENT: Negative.   Eyes: Negative.   Respiratory: Negative.   Cardiovascular: Negative.   Gastrointestinal: Negative.   Genitourinary: Negative.   Musculoskeletal: Positive for falls and joint pain.  Skin: Negative.   Neurological: Negative.   Endo/Heme/Allergies: Negative.   Psychiatric/Behavioral: Negative.   All other systems reviewed and are negative.    Objective:   Today's Vitals: BP (!) 142/86 (BP Location: Left Arm, Patient Position: Sitting, Cuff Size: Normal)   Pulse (!) 109   Temp (!) 97.3 F (36.3 C) (Temporal)   Ht 5\' 4"  (1.626 m)   Wt 184 lb (83.5 kg)   SpO2 (!) 87%   BMI 31.58 kg/m  Vitals with BMI 02/21/2020 02/21/2020 02/21/2020  Height - - -  Weight - - -  BMI - - -  Systolic 161 096 045  Diastolic 409 70 77  Pulse 42 87 94     Physical Exam Vitals and nursing note reviewed.  Constitutional:      Appearance: Normal appearance. She is obese.  HENT:     Head: Normocephalic and atraumatic.     Right Ear: External ear normal.     Left Ear: External ear normal.     Mouth/Throat:     Comments: Mask in place Eyes:     General:        Right eye: No discharge.        Left eye: No discharge.     Conjunctiva/sclera: Conjunctivae normal.  Cardiovascular:     Rate and Rhythm: Normal rate and regular rhythm.     Pulses: Normal pulses.     Heart sounds: Normal heart sounds.  Pulmonary:     Effort: Pulmonary effort is normal.     Breath sounds: Normal breath sounds.  Musculoskeletal:     Right shoulder: Swelling, tenderness and bony tenderness present. Decreased range of motion. Decreased strength.     Cervical back: Normal range of motion and neck supple.  Skin:    General: Skin is warm.  Neurological:     General: No focal deficit present.     Mental Status: She is alert and oriented to person, place, and time.  Psychiatric:        Mood and  Affect: Mood normal.        Behavior: Behavior normal.        Thought Content: Thought content normal.        Judgment: Judgment normal.  Assessment   1. Fall, initial encounter     Tests ordered No orders of the defined types were placed in this encounter.    Plan: Please see assessment and plan per problem list above.   No orders of the defined types were placed in this encounter.   Patient to follow-up in 07/16/2020   [name], NP

## 2020-03-05 DIAGNOSIS — M6289 Other specified disorders of muscle: Secondary | ICD-10-CM | POA: Diagnosis not present

## 2020-03-05 DIAGNOSIS — E538 Deficiency of other specified B group vitamins: Secondary | ICD-10-CM | POA: Diagnosis not present

## 2020-03-05 DIAGNOSIS — R296 Repeated falls: Secondary | ICD-10-CM | POA: Diagnosis not present

## 2020-03-05 DIAGNOSIS — R269 Unspecified abnormalities of gait and mobility: Secondary | ICD-10-CM | POA: Diagnosis not present

## 2020-03-18 ENCOUNTER — Other Ambulatory Visit: Payer: Self-pay | Admitting: *Deleted

## 2020-03-18 DIAGNOSIS — B49 Unspecified mycosis: Secondary | ICD-10-CM

## 2020-03-18 MED ORDER — CLOTRIMAZOLE-BETAMETHASONE 1-0.05 % EX CREA: 1.0000 "application " | TOPICAL_CREAM | Freq: Two times a day (BID) | CUTANEOUS | 0 refills | Status: DC

## 2020-04-03 ENCOUNTER — Ambulatory Visit: Payer: Medicare HMO | Admitting: Family Medicine

## 2020-04-03 DIAGNOSIS — B9620 Unspecified Escherichia coli [E. coli] as the cause of diseases classified elsewhere: Secondary | ICD-10-CM | POA: Diagnosis not present

## 2020-04-03 DIAGNOSIS — R269 Unspecified abnormalities of gait and mobility: Secondary | ICD-10-CM | POA: Diagnosis not present

## 2020-04-03 DIAGNOSIS — R0902 Hypoxemia: Secondary | ICD-10-CM | POA: Diagnosis not present

## 2020-04-03 DIAGNOSIS — Z20822 Contact with and (suspected) exposure to covid-19: Secondary | ICD-10-CM | POA: Diagnosis not present

## 2020-04-03 DIAGNOSIS — M797 Fibromyalgia: Secondary | ICD-10-CM | POA: Diagnosis not present

## 2020-04-03 DIAGNOSIS — S3991XA Unspecified injury of abdomen, initial encounter: Secondary | ICD-10-CM | POA: Diagnosis not present

## 2020-04-03 DIAGNOSIS — R296 Repeated falls: Secondary | ICD-10-CM | POA: Diagnosis not present

## 2020-04-03 DIAGNOSIS — I130 Hypertensive heart and chronic kidney disease with heart failure and stage 1 through stage 4 chronic kidney disease, or unspecified chronic kidney disease: Secondary | ICD-10-CM | POA: Diagnosis not present

## 2020-04-03 DIAGNOSIS — I313 Pericardial effusion (noninflammatory): Secondary | ICD-10-CM | POA: Diagnosis not present

## 2020-04-03 DIAGNOSIS — J441 Chronic obstructive pulmonary disease with (acute) exacerbation: Secondary | ICD-10-CM | POA: Diagnosis not present

## 2020-04-03 DIAGNOSIS — G9341 Metabolic encephalopathy: Secondary | ICD-10-CM | POA: Diagnosis not present

## 2020-04-03 DIAGNOSIS — R531 Weakness: Secondary | ICD-10-CM | POA: Diagnosis not present

## 2020-04-03 DIAGNOSIS — M6289 Other specified disorders of muscle: Secondary | ICD-10-CM | POA: Diagnosis not present

## 2020-04-03 DIAGNOSIS — E538 Deficiency of other specified B group vitamins: Secondary | ICD-10-CM | POA: Diagnosis not present

## 2020-04-03 DIAGNOSIS — E8881 Metabolic syndrome: Secondary | ICD-10-CM | POA: Diagnosis not present

## 2020-04-03 DIAGNOSIS — N179 Acute kidney failure, unspecified: Secondary | ICD-10-CM | POA: Diagnosis not present

## 2020-04-03 DIAGNOSIS — R404 Transient alteration of awareness: Secondary | ICD-10-CM | POA: Diagnosis not present

## 2020-04-03 DIAGNOSIS — J9602 Acute respiratory failure with hypercapnia: Secondary | ICD-10-CM | POA: Diagnosis not present

## 2020-04-03 DIAGNOSIS — E860 Dehydration: Secondary | ICD-10-CM | POA: Diagnosis not present

## 2020-04-03 DIAGNOSIS — R4182 Altered mental status, unspecified: Secondary | ICD-10-CM | POA: Diagnosis not present

## 2020-04-03 DIAGNOSIS — R410 Disorientation, unspecified: Secondary | ICD-10-CM | POA: Diagnosis not present

## 2020-04-03 DIAGNOSIS — J9601 Acute respiratory failure with hypoxia: Secondary | ICD-10-CM | POA: Diagnosis not present

## 2020-04-03 DIAGNOSIS — N390 Urinary tract infection, site not specified: Secondary | ICD-10-CM | POA: Diagnosis not present

## 2020-04-10 ENCOUNTER — Telehealth: Payer: Self-pay | Admitting: *Deleted

## 2020-04-10 NOTE — Telephone Encounter (Signed)
LVM for pt call the office to make transition of care call

## 2020-04-11 ENCOUNTER — Telehealth: Payer: Self-pay | Admitting: *Deleted

## 2020-04-11 NOTE — Telephone Encounter (Signed)
Error

## 2020-04-15 ENCOUNTER — Telehealth: Payer: Self-pay

## 2020-04-15 NOTE — Telephone Encounter (Signed)
Transition Care Management Follow-up Telephone Call   Date discharged?    04/05/2020            How have you been since you were released from the hospital? doesn't seem to have a lot of strength. Eating better   Do you understand why you were in the hospital? UTI   Do you understand the discharge instructions? yes   Where were you discharged to? home   Items Reviewed:  Medications reviewed: yes  Allergies reviewed: yes  Dietary changes reviewed: no changes  Referrals reviewed: no new referrals   Functional Questionnaire:   Activities of Daily Living (ADLs):  no. Has assistant as home    Any transportation issues/concerns?: no   Any patient concerns? just getting her strength back   Confirmed importance and date/time of follow-up visits scheduled 7/20 with verbal understanding.     Confirmed with patient if condition begins to worsen call PCP or go to the ER.  Patient was given the office number and encouraged to call back with question or concerns.yes with verbal understanding.

## 2020-04-17 ENCOUNTER — Emergency Department (HOSPITAL_COMMUNITY): Payer: Medicare HMO

## 2020-04-17 ENCOUNTER — Encounter (HOSPITAL_COMMUNITY): Payer: Self-pay | Admitting: Emergency Medicine

## 2020-04-17 ENCOUNTER — Ambulatory Visit
Admission: EM | Admit: 2020-04-17 | Discharge: 2020-04-17 | Disposition: A | Discharge status: Home / Self Care | Payer: Medicare HMO | Source: Home / Self Care

## 2020-04-17 ENCOUNTER — Other Ambulatory Visit: Payer: Self-pay

## 2020-04-17 ENCOUNTER — Inpatient Hospital Stay (HOSPITAL_COMMUNITY)
Admission: EM | Admit: 2020-04-17 | Discharge: 2020-04-29 | DRG: 682 | Disposition: A | Discharge status: Hospice | Payer: Medicare HMO | Attending: Family Medicine | Admitting: Family Medicine

## 2020-04-17 DIAGNOSIS — F329 Major depressive disorder, single episode, unspecified: Secondary | ICD-10-CM | POA: Diagnosis present

## 2020-04-17 DIAGNOSIS — R591 Generalized enlarged lymph nodes: Secondary | ICD-10-CM | POA: Diagnosis not present

## 2020-04-17 DIAGNOSIS — B370 Candidal stomatitis: Secondary | ICD-10-CM | POA: Diagnosis not present

## 2020-04-17 DIAGNOSIS — Z03818 Encounter for observation for suspected exposure to other biological agents ruled out: Secondary | ICD-10-CM | POA: Diagnosis not present

## 2020-04-17 DIAGNOSIS — Z87891 Personal history of nicotine dependence: Secondary | ICD-10-CM | POA: Diagnosis not present

## 2020-04-17 DIAGNOSIS — J449 Chronic obstructive pulmonary disease, unspecified: Secondary | ICD-10-CM | POA: Diagnosis present

## 2020-04-17 DIAGNOSIS — Z7189 Other specified counseling: Secondary | ICD-10-CM

## 2020-04-17 DIAGNOSIS — J90 Pleural effusion, not elsewhere classified: Secondary | ICD-10-CM | POA: Diagnosis not present

## 2020-04-17 DIAGNOSIS — T39395A Adverse effect of other nonsteroidal anti-inflammatory drugs [NSAID], initial encounter: Secondary | ICD-10-CM | POA: Diagnosis present

## 2020-04-17 DIAGNOSIS — I5189 Other ill-defined heart diseases: Secondary | ICD-10-CM | POA: Diagnosis not present

## 2020-04-17 DIAGNOSIS — G9341 Metabolic encephalopathy: Secondary | ICD-10-CM | POA: Diagnosis present

## 2020-04-17 DIAGNOSIS — R079 Chest pain, unspecified: Secondary | ICD-10-CM

## 2020-04-17 DIAGNOSIS — R410 Disorientation, unspecified: Secondary | ICD-10-CM | POA: Diagnosis not present

## 2020-04-17 DIAGNOSIS — E860 Dehydration: Secondary | ICD-10-CM | POA: Diagnosis present

## 2020-04-17 DIAGNOSIS — N390 Urinary tract infection, site not specified: Secondary | ICD-10-CM | POA: Diagnosis present

## 2020-04-17 DIAGNOSIS — B965 Pseudomonas (aeruginosa) (mallei) (pseudomallei) as the cause of diseases classified elsewhere: Secondary | ICD-10-CM | POA: Diagnosis present

## 2020-04-17 DIAGNOSIS — Z993 Dependence on wheelchair: Secondary | ICD-10-CM

## 2020-04-17 DIAGNOSIS — I69354 Hemiplegia and hemiparesis following cerebral infarction affecting left non-dominant side: Secondary | ICD-10-CM

## 2020-04-17 DIAGNOSIS — I5032 Chronic diastolic (congestive) heart failure: Secondary | ICD-10-CM | POA: Diagnosis not present

## 2020-04-17 DIAGNOSIS — R7881 Bacteremia: Secondary | ICD-10-CM | POA: Diagnosis not present

## 2020-04-17 DIAGNOSIS — Z66 Do not resuscitate: Secondary | ICD-10-CM | POA: Diagnosis present

## 2020-04-17 DIAGNOSIS — I10 Essential (primary) hypertension: Secondary | ICD-10-CM | POA: Diagnosis present

## 2020-04-17 DIAGNOSIS — E870 Hyperosmolality and hypernatremia: Secondary | ICD-10-CM | POA: Diagnosis not present

## 2020-04-17 DIAGNOSIS — Z515 Encounter for palliative care: Secondary | ICD-10-CM | POA: Diagnosis not present

## 2020-04-17 DIAGNOSIS — I6381 Other cerebral infarction due to occlusion or stenosis of small artery: Secondary | ICD-10-CM | POA: Diagnosis not present

## 2020-04-17 DIAGNOSIS — N179 Acute kidney failure, unspecified: Secondary | ICD-10-CM | POA: Diagnosis not present

## 2020-04-17 DIAGNOSIS — I6930 Unspecified sequelae of cerebral infarction: Secondary | ICD-10-CM

## 2020-04-17 DIAGNOSIS — Z20822 Contact with and (suspected) exposure to covid-19: Secondary | ICD-10-CM | POA: Diagnosis present

## 2020-04-17 DIAGNOSIS — E876 Hypokalemia: Secondary | ICD-10-CM | POA: Diagnosis present

## 2020-04-17 DIAGNOSIS — K219 Gastro-esophageal reflux disease without esophagitis: Secondary | ICD-10-CM | POA: Diagnosis present

## 2020-04-17 DIAGNOSIS — J45909 Unspecified asthma, uncomplicated: Secondary | ICD-10-CM | POA: Diagnosis present

## 2020-04-17 DIAGNOSIS — R9431 Abnormal electrocardiogram [ECG] [EKG]: Secondary | ICD-10-CM | POA: Diagnosis not present

## 2020-04-17 DIAGNOSIS — R54 Age-related physical debility: Secondary | ICD-10-CM | POA: Diagnosis not present

## 2020-04-17 DIAGNOSIS — I130 Hypertensive heart and chronic kidney disease with heart failure and stage 1 through stage 4 chronic kidney disease, or unspecified chronic kidney disease: Secondary | ICD-10-CM | POA: Diagnosis present

## 2020-04-17 DIAGNOSIS — J9601 Acute respiratory failure with hypoxia: Secondary | ICD-10-CM | POA: Diagnosis not present

## 2020-04-17 DIAGNOSIS — R918 Other nonspecific abnormal finding of lung field: Secondary | ICD-10-CM | POA: Diagnosis not present

## 2020-04-17 DIAGNOSIS — Z791 Long term (current) use of non-steroidal anti-inflammatories (NSAID): Secondary | ICD-10-CM

## 2020-04-17 DIAGNOSIS — R1312 Dysphagia, oropharyngeal phase: Secondary | ICD-10-CM | POA: Diagnosis present

## 2020-04-17 DIAGNOSIS — N184 Chronic kidney disease, stage 4 (severe): Secondary | ICD-10-CM | POA: Diagnosis present

## 2020-04-17 DIAGNOSIS — Z853 Personal history of malignant neoplasm of breast: Secondary | ICD-10-CM

## 2020-04-17 DIAGNOSIS — R4182 Altered mental status, unspecified: Secondary | ICD-10-CM | POA: Diagnosis not present

## 2020-04-17 DIAGNOSIS — Z79899 Other long term (current) drug therapy: Secondary | ICD-10-CM

## 2020-04-17 DIAGNOSIS — K5730 Diverticulosis of large intestine without perforation or abscess without bleeding: Secondary | ICD-10-CM | POA: Diagnosis not present

## 2020-04-17 DIAGNOSIS — Z7982 Long term (current) use of aspirin: Secondary | ICD-10-CM

## 2020-04-17 DIAGNOSIS — Z8249 Family history of ischemic heart disease and other diseases of the circulatory system: Secondary | ICD-10-CM

## 2020-04-17 LAB — COMPREHENSIVE METABOLIC PANEL
ALT: 13 U/L (ref 0–44)
AST: 20 U/L (ref 15–41)
Albumin: 3.6 g/dL (ref 3.5–5.0)
Alkaline Phosphatase: 100 U/L (ref 38–126)
Anion gap: 9 (ref 5–15)
BUN: 34 mg/dL — ABNORMAL HIGH (ref 8–23)
CO2: 29 mmol/L (ref 22–32)
Calcium: 12.7 mg/dL — ABNORMAL HIGH (ref 8.9–10.3)
Chloride: 96 mmol/L — ABNORMAL LOW (ref 98–111)
Creatinine, Ser: 1.55 mg/dL — ABNORMAL HIGH (ref 0.44–1.00)
GFR calc Af Amer: 33 mL/min — ABNORMAL LOW (ref >60)
GFR calc non Af Amer: 29 mL/min — ABNORMAL LOW (ref >60)
Glucose, Bld: 112 mg/dL — ABNORMAL HIGH (ref 70–99)
Potassium: 3.8 mmol/L (ref 3.5–5.1)
Sodium: 134 mmol/L — ABNORMAL LOW (ref 135–145)
Total Bilirubin: 0.5 mg/dL (ref 0.3–1.2)
Total Protein: 6.5 g/dL (ref 6.5–8.1)

## 2020-04-17 LAB — BLOOD GAS, ARTERIAL
Acid-Base Excess: 5.8 mmol/L — ABNORMAL HIGH (ref 0.0–2.0)
Bicarbonate: 29.3 mmol/L — ABNORMAL HIGH (ref 20.0–28.0)
FIO2: 28
O2 Saturation: 97.4 %
Patient temperature: 36.6
pCO2 arterial: 45.9 mmHg (ref 32.0–48.0)
pH, Arterial: 7.431 (ref 7.350–7.450)
pO2, Arterial: 94.9 mmHg (ref 83.0–108.0)

## 2020-04-17 LAB — MAGNESIUM: Magnesium: 1.9 mg/dL (ref 1.7–2.4)

## 2020-04-17 LAB — URINALYSIS, ROUTINE W REFLEX MICROSCOPIC
Bilirubin Urine: NEGATIVE
Glucose, UA: NEGATIVE mg/dL
Hgb urine dipstick: NEGATIVE
Ketones, ur: NEGATIVE mg/dL
Nitrite: NEGATIVE
Protein, ur: NEGATIVE mg/dL
Specific Gravity, Urine: 1.013 (ref 1.005–1.030)
pH: 6 (ref 5.0–8.0)

## 2020-04-17 LAB — SARS CORONAVIRUS 2 BY RT PCR (HOSPITAL ORDER, PERFORMED IN ~~LOC~~ HOSPITAL LAB): SARS Coronavirus 2: NEGATIVE

## 2020-04-17 LAB — CBC WITH DIFFERENTIAL/PLATELET
Abs Immature Granulocytes: 0.04 10*3/uL (ref 0.00–0.07)
Basophils Absolute: 0.1 10*3/uL (ref 0.0–0.1)
Basophils Relative: 1 %
Eosinophils Absolute: 0.7 10*3/uL — ABNORMAL HIGH (ref 0.0–0.5)
Eosinophils Relative: 8 %
HCT: 34.5 % — ABNORMAL LOW (ref 36.0–46.0)
Hemoglobin: 10.5 g/dL — ABNORMAL LOW (ref 12.0–15.0)
Immature Granulocytes: 0 %
Lymphocytes Relative: 10 %
Lymphs Abs: 0.9 10*3/uL (ref 0.7–4.0)
MCH: 26.3 pg (ref 26.0–34.0)
MCHC: 30.4 g/dL (ref 30.0–36.0)
MCV: 86.5 fL (ref 80.0–100.0)
Monocytes Absolute: 0.7 10*3/uL (ref 0.1–1.0)
Monocytes Relative: 8 %
Neutro Abs: 6.7 10*3/uL (ref 1.7–7.7)
Neutrophils Relative %: 73 %
Platelets: 316 10*3/uL (ref 150–400)
RBC: 3.99 MIL/uL (ref 3.87–5.11)
RDW: 15.1 % (ref 11.5–15.5)
WBC: 9.2 10*3/uL (ref 4.0–10.5)
nRBC: 0 % (ref 0.0–0.2)

## 2020-04-17 LAB — LACTIC ACID, PLASMA: Lactic Acid, Venous: 1.1 mmol/L (ref 0.5–1.9)

## 2020-04-17 LAB — PROTIME-INR
INR: 1 (ref 0.8–1.2)
Prothrombin Time: 13 seconds (ref 11.4–15.2)

## 2020-04-17 LAB — APTT: aPTT: 26 seconds (ref 24–36)

## 2020-04-17 MED ORDER — ASPIRIN EC 81 MG PO TBEC
81.0000 mg | DELAYED_RELEASE_TABLET | Freq: Every day | ORAL | Status: DC
  Administered 2020-04-18 – 2020-04-29 (×12): 81 mg via ORAL
  Filled 2020-04-17 (×12): qty 1

## 2020-04-17 MED ORDER — ACETAMINOPHEN 650 MG RE SUPP: 650.0000 mg | Freq: Four times a day (QID) | RECTAL | Status: DC | PRN

## 2020-04-17 MED ORDER — AMLODIPINE BESYLATE 5 MG PO TABS
5.0000 mg | ORAL_TABLET | Freq: Every day | ORAL | Status: DC
  Administered 2020-04-18 – 2020-04-29 (×12): 5 mg via ORAL
  Filled 2020-04-17 (×12): qty 1

## 2020-04-17 MED ORDER — ACETAMINOPHEN 325 MG PO TABS
650.0000 mg | ORAL_TABLET | Freq: Four times a day (QID) | ORAL | Status: DC | PRN
  Administered 2020-04-18 – 2020-04-28 (×8): 650 mg via ORAL
  Filled 2020-04-17 (×8): qty 2

## 2020-04-17 MED ORDER — SODIUM CHLORIDE 0.9 % IV SOLN: INTRAVENOUS | Status: AC

## 2020-04-17 MED ORDER — HEPARIN SODIUM (PORCINE) 5000 UNIT/ML IJ SOLN
5000.0000 [IU] | Freq: Three times a day (TID) | INTRAMUSCULAR | Status: DC
  Administered 2020-04-17 – 2020-04-27 (×28): 5000 [IU] via SUBCUTANEOUS
  Filled 2020-04-17 (×28): qty 1

## 2020-04-17 MED ORDER — ONDANSETRON HCL 4 MG PO TABS: 4.0000 mg | ORAL_TABLET | Freq: Four times a day (QID) | ORAL | Status: DC | PRN

## 2020-04-17 MED ORDER — ROSUVASTATIN CALCIUM 10 MG PO TABS
5.0000 mg | ORAL_TABLET | Freq: Every evening | ORAL | Status: DC
  Administered 2020-04-17 – 2020-04-28 (×12): 5 mg via ORAL
  Filled 2020-04-17 (×12): qty 1

## 2020-04-17 MED ORDER — OXYBUTYNIN CHLORIDE 5 MG PO TABS
10.0000 mg | ORAL_TABLET | Freq: Every day | ORAL | Status: DC
  Administered 2020-04-17 – 2020-04-28 (×12): 10 mg via ORAL
  Filled 2020-04-17 (×12): qty 2

## 2020-04-17 MED ORDER — SODIUM CHLORIDE 0.9 % IV BOLUS
1000.0000 mL | Freq: Once | INTRAVENOUS | Status: AC
  Administered 2020-04-17: 1000 mL via INTRAVENOUS

## 2020-04-17 MED ORDER — PANTOPRAZOLE SODIUM 40 MG PO TBEC
40.0000 mg | DELAYED_RELEASE_TABLET | Freq: Every day | ORAL | Status: DC
  Administered 2020-04-17 – 2020-04-28 (×12): 40 mg via ORAL
  Filled 2020-04-17 (×12): qty 1

## 2020-04-17 MED ORDER — ALBUTEROL SULFATE (2.5 MG/3ML) 0.083% IN NEBU: 2.5000 mg | INHALATION_SOLUTION | RESPIRATORY_TRACT | Status: DC | PRN

## 2020-04-17 MED ORDER — ONDANSETRON HCL 4 MG/2ML IJ SOLN: 4.0000 mg | Freq: Four times a day (QID) | INTRAMUSCULAR | Status: DC | PRN

## 2020-04-17 MED ORDER — SODIUM CHLORIDE 0.9 % IV SOLN
1.0000 g | INTRAVENOUS | Status: DC
  Administered 2020-04-17: 1 g via INTRAVENOUS
  Filled 2020-04-17: qty 10

## 2020-04-17 MED ORDER — POLYETHYLENE GLYCOL 3350 17 G PO PACK: 17.0000 g | PACK | Freq: Every day | ORAL | Status: DC | PRN

## 2020-04-17 NOTE — ED Notes (Signed)
Patient transported to CT 

## 2020-04-17 NOTE — ED Provider Notes (Signed)
[facility] EMERGENCY DEPARTMENT Provider Note   CSN: [id number] Arrival date & time: 04/17/20  1336     History Chief Complaint  Patient presents with  . possible UTI    Kristie Morris is a 86 y.o. female history of obesity, CAD, asthma, COPD, CHF, GERD, hypertension, SBO.  Majority history obtained from patient's daughter/caretaker who is at bedside.  Patient was discharged from [facility] around 2 weeks ago.  She had been treated for urinary tract infection which had caused confusion.  Daughter reports that over the past 2 days patient has had increasing confusion, decreased p.o. intake and dark-colored urine.  Daughter feels that the patient is disorientated and believes that she is now still in school and refers to family members that have moved away several years ago.  Daughter reports this is how her last admission had started before she found out that there was a urinary tract infection.  Daughter reports that patient has hardly eaten or drink over the past day, one episode of nonbloody/nonbilious emesis.  Denies measuring fever at home, denies any falls or injuries.  Patient denies any pain on exam.  She believes the year is 40.  Level 5 caveat altered mental status  HPI     Past Medical History:  Diagnosis Date  . Anemia 02/02/2014  . Anxiety   . Arthritis    knee  . Asthma   . CAD (coronary artery disease) cardiologist-  dr Bronson Ing  ([facility] in [place])   2003  Cath with nonobstructive CAD  . Chest pain 08/14/2017  . Chronic headache   . Chronic lower back pain   . COPD (chronic obstructive pulmonary disease) ([place])   . Diastolic CHF, chronic ([place])   . Duodenal diverticulum   . Edema of left lower extremity   . Fibromyalgia   . Full dentures   . Gait disorder    uses walker  . GERD (gastroesophageal reflux disease)   . Headache disorder 01/17/2015  . Heart murmur   . History of breast cancer   . History of Clostridium difficile   . History  of colitis    2001  . History of CVA with residual deficit    06/ 2015  residual left hemiparesis  . History of esophageal dilatation   . History of falling    last fall 06-25-2015  . History of hiatal hernia   . History of small bowel obstruction    04-25-2015  resolved without surgical intervention  . Lesion of bladder   . Low vision, one eye    right eye  . Macular degeneration of both eyes    right eye now low vision/  left eye getting injections currently  . Malignant hypertension   . OAB (overactive bladder)   . Protein-calorie malnutrition, severe ([place]) 01/10/2015  . SBO (small bowel obstruction) ([place]) 04/22/2015  . Schatzki's ring   . Sciatica of right side 05/29/2015  . Syncope 08/14/2017  . Urge and stress incontinence   . Varicose veins   . Venous insufficiency, peripheral   . Vertigo     Patient Active Problem List   Diagnosis Date Noted  . Acute metabolic encephalopathy 48/25/0037  . Fall 02/22/2020  . Primary osteoarthritis 01/17/2020  . Post-menopausal 10/24/2019  . Wheelchair bound 10/24/2019  . Overactive bladder 10/17/2019  . Sciatica of right side 05/29/2015  . Essential hypertension 04/27/2015  . Personal history of stroke with current residual effects 04/27/2015  . Abnormality of gait 01/08/2015  .  Recurrent falls 01/08/2015  . Pre-syncope 01/08/2015  . Left-sided weakness 04/02/2014  . Diastolic dysfunction 01/75/1025  . Asthma, chronic 02/02/2014  . COPD (chronic obstructive pulmonary disease) ([place]) 02/02/2014  . Hyperlipidemia 02/02/2014    Past Surgical History:  Procedure Laterality Date  . [place]   from colon  . ABDOMINAL HYSTERECTOMY  1954   w/ bilateral salpingoophorectomy  . ANTERIOR CERVICAL DECOMP/DISCECTOMY FUSION  1971   "used bone off of my right hip"  . BREAST LUMPECTOMY Left 2006  . CARDIAC CATHETERIZATION  11-29-2001  dr Tressia Miners turner   Non-obstructive CAD/  20% pLAD,  30% pD1, 50% mRCA/  normal LVF   . CATARACT EXTRACTION W/ INTRAOCULAR LENS  IMPLANT, BILATERAL  right 2008//  left 2015  . CYSTOSCOPY WITH HYDRODISTENSION AND BIOPSY N/A 07/23/2015   Procedure: CYSTOSCOPY;HYDRODISTENSION;  Surgeon: Irine Seal, MD;  [facility]  Service: Urology;  Laterality: N/A;  . DILATION AND CURETTAGE OF UTERUS  1953    miscarriage  . [place]  . KNEE ARTHROSCOPY Right 2005  . MASTECTOMY, PARTIAL Right 1999  . OVARIAN CYST REMOVAL  1946  . PATELLA FRACTURE SURGERY Left 2008  . TRANSTHORACIC ECHOCARDIOGRAM  01-09-2015   mild LVH,  ef 85-27%, grade I diastolic dysfunction/  mild MR/  trivial pericardial effusion was identified     OB History   No obstetric history on file.     Family History  Problem Relation Age of Onset  . Heart attack Mother   . CAD Mother   . Polycythemia Mother   . Heart attack Sister   . Cancer - Ovarian Sister   . Cancer - Colon Father   . Macular degeneration Sister   . Thyroid disease Daughter   . Rheumatic fever Daughter     Social History   Tobacco Use  . Smoking status: Former Smoker    Packs/day: 0.50    Years: 15.00    Pack years: 7.50    Types: Cigarettes    Quit date: 07/17/1987    Years since quitting: 32.7  . Smokeless tobacco: Never Used  Vaping Use  . Vaping Use: Never used  Substance Use Topics  . Alcohol use: Yes    Alcohol/week: 1.0 standard drink    Types: 1 Glasses of wine per week    Comment: rarely  . Drug use: No    Home Medications Prior to Admission medications   Medication Sig Start Date End Date Taking? Authorizing Provider  acetaminophen (TYLENOL) 500 MG tablet Take 1,000 mg daily as needed by mouth for mild pain or moderate pain.   Yes [provider]  albuterol (VENTOLIN HFA) 108 (90 Base) MCG/ACT inhaler Inhale 2 puffs into the lungs every 6 (six) hours as needed for wheezing or shortness of breath. 01/16/20  Yes [name], NP  amLODipine (NORVASC) 10 MG tablet  Take 10 mg by mouth in the morning.  09/18/19  Yes [provider]  aspirin EC 81 MG tablet Take 81 mg by mouth in the morning.    Yes [provider]  Bacillus Coagulans-Inulin (PROBIOTIC FORMULA PO) Take 1 capsule by mouth in the morning and at bedtime.   Yes [provider]  clotrimazole-betamethasone (LOTRISONE) cream Apply 1 application topically 2 (two) times daily. Patient taking differently: Apply 1 application topically daily as needed (under breast/inner thigh areas).  03/18/20  Yes [name], NP  DULoxetine (CYMBALTA) 30 MG capsule Take 30  mg by mouth in the morning.  09/18/19  Yes [provider]  DULoxetine (CYMBALTA) 60 MG capsule Take 1 capsule (60 mg total) by mouth daily. Patient taking differently: Take 60 mg by mouth in the morning.  01/22/20  Yes [name], NP  gabapentin (NEURONTIN) 300 MG capsule Take 300 mg by mouth 3 (three) times daily. 03/02/20  Yes [provider]  meloxicam (MOBIC) 7.5 MG tablet Take 1 tablet (7.5 mg total) by mouth 2 (two) times daily. 02/14/20  Yes [name], NP  nitroGLYCERIN (NITROSTAT) 0.4 MG SL tablet Place 0.4 mg every 5 (five) minutes as needed under the tongue.  09/10/15  Yes [provider]  oxybutynin (DITROPAN) 5 MG tablet Take 1 tablet (5 mg total) by mouth 2 (two) times daily. Patient taking differently: Take 10 mg by mouth at bedtime.  02/14/20  Yes [name], NP  pantoprazole (PROTONIX) 40 MG tablet Take 1 tablet (40 mg total) by mouth daily. Patient taking differently: Take 40 mg by mouth daily in the afternoon.  12/11/19  Yes [name], NP  polyethylene glycol powder (GLYCOLAX/MIRALAX) powder Take 17 g daily as needed by mouth for mild constipation.   Yes [provider]  rosuvastatin (CRESTOR) 5 MG tablet TAKE 1 TABLET BY MOUTH ONCE DAILY. Patient taking differently: Take 5 mg by mouth every evening.  02/05/20  Yes [name], NP   HYDROcodone-acetaminophen (NORCO/VICODIN) 5-325 MG tablet Take 1 tablet by mouth every 4 (four) hours as needed. Patient not taking: Reported on 04/17/2020 02/21/20   Dorie Rank, MD  traZODone (DESYREL) 50 MG tablet Take 50 mg by mouth at bedtime. Patient not taking: Reported on 04/17/2020 09/18/19   [provider]    Allergies    Zithromax [azithromycin], Celebrex [celecoxib], Ciprofloxacin, Metronidazole, Morphine and related, Moxifloxacin, Penicillins, Polymyxin b, Sulfa antibiotics, Vancomycin, and Vioxx [rofecoxib]  Review of Systems   Review of Systems  Unable to perform ROS: Mental status change    Physical Exam Updated Vital Signs BP (!) 143/90   Pulse 89   Temp 97.9 F (36.6 C) (Rectal)   Resp (!) 21   Ht 5\' 4"  (1.626 m)   Wt 83 kg   SpO2 97%   BMI 31.41 kg/m   Physical Exam Constitutional:      General: She is not in acute distress.    Appearance: Normal appearance. She is well-developed. She is not ill-appearing or diaphoretic.  HENT:     Head: Normocephalic and atraumatic.  Eyes:     General: Vision grossly intact. Gaze aligned appropriately.     Pupils: Pupils are equal, round, and reactive to light.  Neck:     Trachea: Trachea and phonation normal.  Cardiovascular:     Rate and Rhythm: Normal rate and regular rhythm.  Pulmonary:     Effort: Pulmonary effort is normal. No respiratory distress.  Abdominal:     General: There is no distension.     Palpations: Abdomen is soft.     Tenderness: There is no abdominal tenderness. There is no guarding or rebound.  Musculoskeletal:        General: Normal range of motion.     Cervical back: Normal range of motion.     Right lower leg: Edema present.     Left lower leg: Edema present.  Skin:    General: Skin is warm and dry.  Neurological:     Mental Status: She is alert.  GCS: GCS eye subscore is 4. GCS verbal subscore is 5. GCS motor subscore is 6.     Comments: Speech is clear, patient oriented  to self and place but not to time or event.  Follows commands Major Cranial nerves without deficit, no facial droop Mildly decreased strength of left extremities compared to right, patient's daughter reports this is baseline. Sensation normal to light and sharp touch Moves extremities without ataxia  Psychiatric:        Behavior: Behavior normal.     ED Results / Procedures / Treatments   Labs (all labs ordered are listed, but only abnormal results are displayed) Labs Reviewed  COMPREHENSIVE METABOLIC PANEL - Abnormal; Notable for the following components:      Result Value   Sodium 134 (*)    Chloride 96 (*)    Glucose, Bld 112 (*)    BUN 34 (*)    Creatinine, Ser 1.55 (*)    Calcium 12.7 (*)    GFR calc non Af Amer 29 (*)    GFR calc Af Amer 33 (*)    All other components within normal limits  CBC WITH DIFFERENTIAL/PLATELET - Abnormal; Notable for the following components:   Hemoglobin 10.5 (*)    HCT 34.5 (*)    Eosinophils Absolute 0.7 (*)    All other components within normal limits  URINALYSIS, ROUTINE W REFLEX MICROSCOPIC - Abnormal; Notable for the following components:   Leukocytes,Ua TRACE (*)    Bacteria, UA RARE (*)    All other components within normal limits  BLOOD GAS, ARTERIAL - Abnormal; Notable for the following components:   Bicarbonate 29.3 (*)    Acid-Base Excess 5.8 (*)    All other components within normal limits  CULTURE, BLOOD (ROUTINE X 2)  CULTURE, BLOOD (ROUTINE X 2)  SARS CORONAVIRUS 2 BY RT PCR (HOSPITAL ORDER, [facility])  URINE CULTURE  LACTIC ACID, PLASMA  APTT  PROTIME-INR    EKG EKG Interpretation  Date/Time:  Wednesday April 17 2020 15:42:09 EDT Ventricular Rate:  92 PR Interval:    QRS Duration: 82 QT Interval:  335 QTC Calculation: 415 R Axis:   -47 Text Interpretation: Sinus rhythm Prolonged PR interval Abnormal R-wave progression, early transition Inferior infarct, old no significant change  since May 2021 Confirmed by Sherwood Gambler [phone]) on 04/17/2020 5:05:55 PM   Radiology DG Chest 1 View  Result Date: 04/17/2020 CLINICAL DATA:  Chest pain, altered mental status EXAM: CHEST  1 VIEW COMPARISON:  02/21/2020 FINDINGS: The generalized lung apices, slightly limiting evaluation. The lungs are symmetrically expanded and are clear. No definite pneumothorax. No pleural effusion. The cardiomediastinal silhouette is unremarkable. Pulmonary vascularity is normal. Surgical clips are noted within the axilla bilaterally. No acute bone abnormality IMPRESSION: No active disease. Electronically Signed   By: Fidela Salisbury MD   On: 04/17/2020 15:11   CT Head Wo Contrast  Result Date: 04/17/2020 CLINICAL DATA:  Delirium EXAM: CT HEAD WITHOUT CONTRAST TECHNIQUE: Contiguous axial images were obtained from the base of the skull through the vertex without intravenous contrast. COMPARISON:  Feb 21, 2020 FINDINGS: Brain: No evidence of acute territorial infarction, hemorrhage, hydrocephalus,extra-axial collection or mass lesion/mass effect. There is dilatation the ventricles and sulci consistent with age-related atrophy. Low-attenuation changes in the deep white matter consistent with small vessel ischemia. Prior lacunar infarct seen within the right basal ganglia. Vascular: No hyperdense vessel or unexpected calcification. Skull: The skull is intact. No fracture or focal  lesion identified. Sinuses/Orbits: The visualized paranasal sinuses and mastoid air cells are clear. The orbits and globes intact. Other: None IMPRESSION: No acute intracranial abnormality. Findings consistent with age related atrophy and chronic small vessel ischemia Electronically Signed   By: Prudencio Pair M.D.   On: 04/17/2020 17:00    Procedures .Critical Care Performed by: Deliah Boston, PA-C Authorized by: Deliah Boston, PA-C   Critical care provider statement:    Critical care time (minutes):  35   Critical care was  necessary to treat or prevent imminent or life-threatening deterioration of the following conditions:  Respiratory failure   Critical care was time spent personally by me on the following activities:  Discussions with consultants, evaluation of patient's response to treatment, examination of patient, ordering and performing treatments and interventions, ordering and review of laboratory studies, ordering and review of radiographic studies, pulse oximetry, re-evaluation of patient's condition, obtaining history from patient or surrogate, review of old charts and development of treatment plan with patient or surrogate   (including critical care time)  Medications Ordered in ED Medications  cefTRIAXone (ROCEPHIN) 1 g in sodium chloride 0.9 % 100 mL IVPB (0 g Intravenous Stopped 04/17/20 1709)  sodium chloride 0.9 % bolus 1,000 mL (1,000 mLs Intravenous New Bag/Given 04/17/20 1551)    ED Course  I have reviewed the triage vital signs and the nursing notes.  Pertinent labs & imaging results that were available during my care of the patient were reviewed by me and considered in my medical decision making (see chart for details).  Clinical Course as of Apr 17 1849  Wed Apr 17, 2020  1825 Dr. Denton Brick   [BM]    Clinical Course User Index [BM] Gari Crown   MDM Rules/Calculators/A&P                          Additional History Obtained: 1. Nursing notes from this visit. 2. Unable to review [facility] records through EMR. 3. Family, patient's daughter/caregiver at bedside. ----- 86 year old female with history above presented for confusion unclear cause with her daughter.  Concern for possible UTI given recent admission and dark-colored urine today.  No falls or injury per daughter.  On exam patient is pleasant and in no acute distress alert and oriented x2 reports she is feeling well denies pain or shortness of breath.  She is minimally tachycardic on initial evaluation but does not  appear toxic or septic.  Will obtain sepsis labs and give empiric Rocephin for concern of UTI along with a fluid bolus.  Additionally will obtain CT head. Cranial nerves intact, no meningeal signs, heart regular rate and rhythm, lungs clear, abdomen soft nontender, neurovascular intact to all 4 extremities, 2+ bilateral lower extremity edema which patient's daughter report is unchanged.  Patient does have some weakness in the left extremities compared to right but daughter reports this is also unchanged.  No evidence of significant injury.  Sacrum shows mild erythema no skin break.  Patient was then seen and evaluated by Dr. Regenia Skeeter, patient's SPO2 89% on room air, unclear etiology and an arterial blood gas was added.  Patient placed on 2 L nasal cannula with improvement of saturation to 99%. - I ordered, reviewed and interpreted labs which include: ABG with elevated acid-base, otherwise nonacute. CMP shows kidney function near baseline, no emergent electrolyte derangement, elevation of LFTs or gap. Lactic 1.1 is reassuring PT/INR and APTT within normal limit CBC shows no  leukocytosis, mild anemia of 10.5 around one-point lower than 1 month ago, patient without bleeding symptoms. Covid test negative Urinalysis shows trace leukocytes, 6/10 WBCs and rare bacteria.  CT Head:  IMPRESSION:  No acute intracranial abnormality.    Findings consistent with age related atrophy and chronic small  vessel ischemia  I personally reviewed patient's CT head and agree with radiologist interpretation above.  DG Chest:  IMPRESSION:  No active disease.  I personally reviewed patient's chest x-ray and agree with radiologist interpretation.  EKG: Sinus rhythm Prolonged PR interval Abnormal R-wave progression, early transition Inferior infarct, old no significant change since May 2021 Confirmed by Sherwood Gambler [phone]) on 04/17/2020 5:05:55 PM -------- Patient reevaluated resting comfortably in bed no acute  distress.  I have updated patient and her daughter of findings above and of care plan.  They are agreeable for admission.  Patient will need admission for encephalopathy of unclear cause and new oxygen requirement.  Patient seen and evaluated by Dr. Regenia Skeeter during this visit who agrees with work-up and admission at this time. - Discussed case with Dr. Denton Brick at 6:25 PM, patient was accepted to hospitalist service.   Note: Portions of this report may have been transcribed using voice recognition software. Every effort was made to ensure accuracy; however, inadvertent computerized transcription errors may still be present. Final Clinical Impression(s) / ED Diagnoses Final diagnoses:  Confusion  Acute respiratory failure with hypoxia [facility])    Rx / DC Orders ED Discharge Orders    None       Gari Crown 04/17/20 1850    Sherwood Gambler, MD 04/18/20 1739

## 2020-04-17 NOTE — ED Triage Notes (Signed)
Pt comp[lains of dark urine and confusion. Pt was d/ced from the hospital in [place] for the same at the beginning of the month. Care given denies fevers but endorse n/v.

## 2020-04-17 NOTE — ED Notes (Signed)
No success with IV attempts with Ultrasound IV from SWAT nurse. Admitting physician notified.

## 2020-04-17 NOTE — ED Notes (Signed)
Patient is being discharged from the Urgent Care and sent to the Emergency Department via personal vehicle. Per [place] PA, patient is in need of higher level of care due to worsening urinary tract infection symptoms post admission, pt was recently discharged from [place]. Pt has had increased confusion and decreased intake over the last few days. Patient is aware and verbalizes understanding of plan of care.  Vitals:   04/17/20 1229  BP: 127/76  Pulse: (!) 107  Resp: 20  Temp: 98.9 F (37.2 C)  SpO2: 93%

## 2020-04-17 NOTE — Progress Notes (Signed)
Called to assist with difficult IV. Patient with right arm restriction due to s/p mastectomy. Patient with noted swelling from recent Left AC IV infiltration that extends from mid upper arm to wrist. Attempted access in left hand x 2 with small bore (24 G). Both infiltrated. Primary RN notified.

## 2020-04-17 NOTE — H&P (Addendum)
History and Physical    Kristie Morris TML:[id number] DOB: 1925-08-22 DOA: 04/17/2020  PCP: [name], NP   Patient coming from: Home  I have personally briefly reviewed patient's old medical records in [place]  Chief Complaint: Confusion  HPI: Kristie Morris is a 86 y.o. female with medical history significant for diastolic CHF, COPD, asthma, hypertension, CVA with residual left hemiparesis. Patient was brought to the ED with reports of confusion that started last night.  History is obtained from patient's granddaughter in law who is at bedside and takes care of patient, a limited history from patient herself.  On time of evaluation patient is awake alert oriented to person and place, she is able to answer simple questions but not give me a lot of detail. Patient also has been generally weak, with poor p.o. intake since yesterday.  Patient's urine was also dark, family was concerned that patient had a UTI.  On questioning patient she denies dysuria.  She has a chronic unchanged cough, she denies difficulty breathing.  No chest pain.  Patient also reports dizziness when standing over the past 2 weeks that is getting worse. At baseline patient ambulates with a wheelchair, does not have memory issues/dementia.  Patient was discharged from [facility] 5 days ago, she was admitted 7/7-7/9, for altered mental status secondary to UTI, she was treated with an antibiotic and discharged on Keflex-for additional 2-1/2 days which patient was compliant with.  ED Course: Temperature 98.5.  80s to 90s, blood pressure systolic 681E to 751Z.  WBC 9.3.  Creatinine 1.5 about baseline.  Lactic acid 1.1.  UA with trace leukocytes rare bacteria.  Portable chest x-ray without acute abnormality.  Head CT without acute normality.  In the ED O2 sat dropped to 89% patient was placed on 2 L nasal cannula.  But on my evaluation patient O2 sats greater than 94% on room air.  1 L bolus given.  Hospitalist to  admit for metabolic encephalopathy.   Review of Systems: As per HPI all other systems reviewed and negative.  Past Medical History:  Diagnosis Date  . Anemia 02/02/2014  . Anxiety   . Arthritis    knee  . Asthma   . CAD (coronary artery disease) cardiologist-  dr Bronson Ing  ([facility] in [place])   2003  Cath with nonobstructive CAD  . Chest pain 08/14/2017  . Chronic headache   . Chronic lower back pain   . COPD (chronic obstructive pulmonary disease) ([place])   . Diastolic CHF, chronic ([place])   . Duodenal diverticulum   . Edema of left lower extremity   . Fibromyalgia   . Full dentures   . Gait disorder    uses walker  . GERD (gastroesophageal reflux disease)   . Headache disorder 01/17/2015  . Heart murmur   . History of breast cancer   . History of Clostridium difficile   . History of colitis    2001  . History of CVA with residual deficit    06/ 2015  residual left hemiparesis  . History of esophageal dilatation   . History of falling    last fall 06-25-2015  . History of hiatal hernia   . History of small bowel obstruction    04-25-2015  resolved without surgical intervention  . Lesion of bladder   . Low vision, one eye    right eye  . Macular degeneration of both eyes    right eye now low vision/  left eye getting injections currently  . Malignant hypertension   . OAB (overactive bladder)   . Protein-calorie malnutrition, severe ([place]) 01/10/2015  . SBO (small bowel obstruction) ([place]) 04/22/2015  . Schatzki's ring   . Sciatica of right side 05/29/2015  . Syncope 08/14/2017  . Urge and stress incontinence   . Varicose veins   . Venous insufficiency, peripheral   . Vertigo     Past Surgical History:  Procedure Laterality Date  . [place]   from colon  . ABDOMINAL HYSTERECTOMY  1954   w/ bilateral salpingoophorectomy  . ANTERIOR CERVICAL DECOMP/DISCECTOMY FUSION  1971   "used bone off of my right hip"  . BREAST LUMPECTOMY Left  2006  . CARDIAC CATHETERIZATION  11-29-2001  dr Tressia Miners turner   Non-obstructive CAD/  20% pLAD,  30% pD1, 50% mRCA/  normal LVF  . CATARACT EXTRACTION W/ INTRAOCULAR LENS  IMPLANT, BILATERAL  right 2008//  left 2015  . CYSTOSCOPY WITH HYDRODISTENSION AND BIOPSY N/A 07/23/2015   Procedure: CYSTOSCOPY;HYDRODISTENSION;  Surgeon: Irine Seal, MD;  Location[facility];  Service: Urology;  Laterality: N/A;  . DILATION AND CURETTAGE OF UTERUS  1953    miscarriage  . [place]  . KNEE ARTHROSCOPY Right 2005  . MASTECTOMY, PARTIAL Right 1999  . OVARIAN CYST REMOVAL  1946  . PATELLA FRACTURE SURGERY Left 2008  . TRANSTHORACIC ECHOCARDIOGRAM  01-09-2015   mild LVH,  ef 44-81%, grade I diastolic dysfunction/  mild MR/  trivial pericardial effusion was identified     reports that she quit smoking about 32 years ago. Her smoking use included cigarettes. She has a 7.50 pack-year smoking history. She has never used smokeless tobacco. She reports current alcohol use of about 1.0 standard drink of alcohol per week. She reports that she does not use drugs.  Allergies  Allergen Reactions  . Zithromax [Azithromycin] Diarrhea    Pt in hosp for 2 weeks   . Celebrex [Celecoxib] Hives  . Ciprofloxacin Hives  . Metronidazole Hives  . Morphine And Related Rash  . Moxifloxacin Hives  . Penicillins Hives  . Polymyxin B Hives  . Sulfa Antibiotics Hives  . Vancomycin Hives  . Vioxx [Rofecoxib] Hives    Family History  Problem Relation Age of Onset  . Heart attack Mother   . CAD Mother   . Polycythemia Mother   . Heart attack Sister   . Cancer - Ovarian Sister   . Cancer - Colon Father   . Macular degeneration Sister   . Thyroid disease Daughter   . Rheumatic fever Daughter     Prior to Admission medications   Medication Sig Start Date End Date Taking? Authorizing Provider  acetaminophen (TYLENOL) 500 MG tablet Take 1,000 mg daily as needed by mouth for mild pain or  moderate pain.   Yes [provider]  albuterol (VENTOLIN HFA) 108 (90 Base) MCG/ACT inhaler Inhale 2 puffs into the lungs every 6 (six) hours as needed for wheezing or shortness of breath. 01/16/20  Yes [name], NP  amLODipine (NORVASC) 10 MG tablet Take 10 mg by mouth in the morning.  09/18/19  Yes [provider]  aspirin EC 81 MG tablet Take 81 mg by mouth in the morning.    Yes [provider]  Bacillus Coagulans-Inulin (PROBIOTIC FORMULA PO) Take 1 capsule by mouth in the morning and at bedtime.   Yes [provider]  clotrimazole-betamethasone (LOTRISONE) cream Apply  1 application topically 2 (two) times daily. Patient taking differently: Apply 1 application topically daily as needed (under breast/inner thigh areas).  03/18/20  Yes [name], NP  DULoxetine (CYMBALTA) 30 MG capsule Take 30 mg by mouth in the morning.  09/18/19  Yes [provider]  DULoxetine (CYMBALTA) 60 MG capsule Take 1 capsule (60 mg total) by mouth daily. Patient taking differently: Take 60 mg by mouth in the morning.  01/22/20  Yes [name], NP  gabapentin (NEURONTIN) 300 MG capsule Take 300 mg by mouth 3 (three) times daily. 03/02/20  Yes [provider]  meloxicam (MOBIC) 7.5 MG tablet Take 1 tablet (7.5 mg total) by mouth 2 (two) times daily. 02/14/20  Yes [name], NP  nitroGLYCERIN (NITROSTAT) 0.4 MG SL tablet Place 0.4 mg every 5 (five) minutes as needed under the tongue.  09/10/15  Yes [provider]  oxybutynin (DITROPAN) 5 MG tablet Take 1 tablet (5 mg total) by mouth 2 (two) times daily. Patient taking differently: Take 10 mg by mouth at bedtime.  02/14/20  Yes [name], NP  pantoprazole (PROTONIX) 40 MG tablet Take 1 tablet (40 mg total) by mouth daily. Patient taking differently: Take 40 mg by mouth daily in the afternoon.  12/11/19  Yes [name], NP  polyethylene glycol powder (GLYCOLAX/MIRALAX) powder Take 17  g daily as needed by mouth for mild constipation.   Yes [provider]  rosuvastatin (CRESTOR) 5 MG tablet TAKE 1 TABLET BY MOUTH ONCE DAILY. Patient taking differently: Take 5 mg by mouth every evening.  02/05/20  Yes [name], NP  HYDROcodone-acetaminophen (NORCO/VICODIN) 5-325 MG tablet Take 1 tablet by mouth every 4 (four) hours as needed. Patient not taking: Reported on 04/17/2020 02/21/20   Dorie Rank, MD  traZODone (DESYREL) 50 MG tablet Take 50 mg by mouth at bedtime. Patient not taking: Reported on 04/17/2020 09/18/19   [provider]    Physical Exam: Vitals:   04/17/20 1559 04/17/20 1629 04/17/20 1639 04/17/20 1659  BP: 138/63 136/68  (!) 143/90  Pulse: 87  88 89  Resp: 19 18 (!) 21 (!) 21  Temp:      TempSrc:      SpO2: 100%  99% 97%  Weight:      Height:        Constitutional: NAD, calm, comfortable Vitals:   04/17/20 1559 04/17/20 1629 04/17/20 1639 04/17/20 1659  BP: 138/63 136/68  (!) 143/90  Pulse: 87  88 89  Resp: 19 18 (!) 21 (!) 21  Temp:      TempSrc:      SpO2: 100%  99% 97%  Weight:      Height:       Eyes: PERRL, lids and conjunctivae normal ENMT: Mucous membranes are dry Neck: normal, supple, no masses, no thyromegaly Respiratory: Mild crackles bilateral bases, no wheezing appreciated, normal respiratory effort. No accessory muscle use.  Cardiovascular: Regular rate and rhythm, no murmurs / rubs / gallops.  Legs appear puffy, but without pitting  extremity edema. 2+ pedal pulses. Abdomen: no tenderness, no masses palpated. No hepatosplenomegaly. Bowel sounds positive.  Musculoskeletal: no clubbing / cyanosis. No joint deformity upper and lower extremities. Good ROM, no contractures. Normal muscle tone.  Skin: no rashes, lesions, ulcers. No induration. Left upper extremity swelling from IVF inflitration. Neurologic: No apparent cranial nerve abnormality, 4+/5 strength right upper and lower extremity, 4/5 strength left upper and  lower extremity-the latter  is chronic and unchanged from prior CVA. Psychiatric: Normal judgment and insight. Alert and oriented to person and place, she is able to tell me her name and where she is, but not exactly to situation.  Following directions.  Normal mood.   Labs on Admission: I have personally reviewed following labs and imaging studies  CBC: Recent Labs  Lab 04/17/20 1619  WBC 9.2  NEUTROABS 6.7  HGB 10.5*  HCT 34.5*  MCV 86.5  PLT 829   Basic Metabolic Panel: Recent Labs  Lab 04/17/20 1619  NA 134*  K 3.8  CL 96*  CO2 29  GLUCOSE 112*  BUN 34*  CREATININE 1.55*  CALCIUM 12.7*   Liver Function Tests: Recent Labs  Lab 04/17/20 1619  AST 20  ALT 13  ALKPHOS 100  BILITOT 0.5  PROT 6.5  ALBUMIN 3.6   Coagulation Profile: Recent Labs  Lab 04/17/20 1619  INR 1.0   Urine analysis:    Component Value Date/Time   COLORURINE YELLOW 04/17/2020 1434   APPEARANCEUR CLEAR 04/17/2020 1434   APPEARANCEUR Clear 03/20/2014 1656   LABSPEC 1.013 04/17/2020 1434   LABSPEC 1.015 03/20/2014 1656   PHURINE 6.0 04/17/2020 1434   GLUCOSEU NEGATIVE 04/17/2020 1434   GLUCOSEU Negative 03/20/2014 1656   HGBUR NEGATIVE 04/17/2020 1434   BILIRUBINUR NEGATIVE 04/17/2020 1434   BILIRUBINUR Negative 03/20/2014 1656   KETONESUR NEGATIVE 04/17/2020 1434   PROTEINUR NEGATIVE 04/17/2020 1434   UROBILINOGEN 0.2 04/22/2015 1520   NITRITE NEGATIVE 04/17/2020 1434   LEUKOCYTESUR TRACE (A) 04/17/2020 1434   LEUKOCYTESUR Negative 03/20/2014 1656    Radiological Exams on Admission: DG Chest 1 View  Result Date: 04/17/2020 CLINICAL DATA:  Chest pain, altered mental status EXAM: CHEST  1 VIEW COMPARISON:  02/21/2020 FINDINGS: The generalized lung apices, slightly limiting evaluation. The lungs are symmetrically expanded and are clear. No definite pneumothorax. No pleural effusion. The cardiomediastinal silhouette is unremarkable. Pulmonary vascularity is normal. Surgical clips are  noted within the axilla bilaterally. No acute bone abnormality IMPRESSION: No active disease. Electronically Signed   By: Fidela Salisbury MD   On: 04/17/2020 15:11   CT Head Wo Contrast  Result Date: 04/17/2020 CLINICAL DATA:  Delirium EXAM: CT HEAD WITHOUT CONTRAST TECHNIQUE: Contiguous axial images were obtained from the base of the skull through the vertex without intravenous contrast. COMPARISON:  Feb 21, 2020 FINDINGS: Brain: No evidence of acute territorial infarction, hemorrhage, hydrocephalus,extra-axial collection or mass lesion/mass effect. There is dilatation the ventricles and sulci consistent with age-related atrophy. Low-attenuation changes in the deep white matter consistent with small vessel ischemia. Prior lacunar infarct seen within the right basal ganglia. Vascular: No hyperdense vessel or unexpected calcification. Skull: The skull is intact. No fracture or focal lesion identified. Sinuses/Orbits: The visualized paranasal sinuses and mastoid air cells are clear. The orbits and globes intact. Other: None IMPRESSION: No acute intracranial abnormality. Findings consistent with age related atrophy and chronic small vessel ischemia Electronically Signed   By: Prudencio Pair M.D.   On: 04/17/2020 17:00    EKG: Independently reviewed.  Sinus rhythm, QTC 415.  No significant ST or T wave changes compared to prior EKG.  Assessment/Plan Principal Problem:   Acute metabolic encephalopathy Active Problems:   Asthma, chronic   COPD (chronic obstructive pulmonary disease) (HCC)   Essential hypertension   Personal history of stroke with current residual effects   Wheelchair bound  Acute metabolic encephalopathy-confusion/disorientation, weakness, poor p.o. intake, dizziness.  Etiology likely secondary to dehydration, but  creatinine and vitals are stable at this time.  Recent admission at [facility] completed 5-day course for UTI.  With cephalosporins.  UA today not convincing for UTI with  rare bacteria, trace leukocytes, she denies symptoms.  She is afebrile with normal WBC 9.2 and normal lactic acid of 1.1. -Hold off on antibiotics at this time -Follow-up urine cultures, blood cultures ordered in the ED -PT evaluation - 1 l bolus given., continue Normal saline 75cc/hr x 20hrs -Hold psychoactive agents gabapentin, Cymbalta.  Asthma, COPD-stable.  O2 sats my evaluation on room air greater than 94%.  Portable chest x-ray without acute abnormality. -Albuterol nebulizer as needed  Hypertension-stable. -Complaints of dizziness, will reduce Norvasc to 5 mg daily  CVA Hx with residual left-sided deficits, wheelchair-bound -Resume aspirin, Crestor,   CKD 4-stable and about baseline, creatinine 1.55, baseline 1.1-1.5.   DVT prophylaxis: Heparin Code Status: Full code, confirmed with granddaughter in law at bedside and patient's grandson on the phone. Family Communication: Talked to patient's grandson on the phone, and granddaughter in law at bedside. Disposition Plan: 1 to 2 days. Consults called: None. Admission status: Observation, telemetry.   Bethena Roys MD [facility]  04/17/2020, 7:42 PM

## 2020-04-17 NOTE — ED Notes (Signed)
Pt has edema at left [facility] IV site. IV removed by this nurse. SWAT nurse attempting IV in left hand. Pt has restricted right limb. Admitting physician notified.

## 2020-04-17 NOTE — ED Triage Notes (Signed)
Pt presents with complaints of dark urine and confusion. Family reports she was just discharged out to the hospital for a uti a week ago. Reports she had these same symptoms but worse at that time.

## 2020-04-18 ENCOUNTER — Observation Stay: Payer: Self-pay

## 2020-04-18 DIAGNOSIS — E860 Dehydration: Secondary | ICD-10-CM | POA: Diagnosis present

## 2020-04-18 DIAGNOSIS — T39395A Adverse effect of other nonsteroidal anti-inflammatory drugs [NSAID], initial encounter: Secondary | ICD-10-CM | POA: Diagnosis present

## 2020-04-18 DIAGNOSIS — Z853 Personal history of malignant neoplasm of breast: Secondary | ICD-10-CM | POA: Diagnosis not present

## 2020-04-18 DIAGNOSIS — I69354 Hemiplegia and hemiparesis following cerebral infarction affecting left non-dominant side: Secondary | ICD-10-CM | POA: Diagnosis not present

## 2020-04-18 DIAGNOSIS — R410 Disorientation, unspecified: Secondary | ICD-10-CM | POA: Diagnosis present

## 2020-04-18 DIAGNOSIS — R7881 Bacteremia: Secondary | ICD-10-CM | POA: Diagnosis present

## 2020-04-18 DIAGNOSIS — I10 Essential (primary) hypertension: Secondary | ICD-10-CM | POA: Diagnosis not present

## 2020-04-18 DIAGNOSIS — I5032 Chronic diastolic (congestive) heart failure: Secondary | ICD-10-CM | POA: Diagnosis present

## 2020-04-18 DIAGNOSIS — B965 Pseudomonas (aeruginosa) (mallei) (pseudomallei) as the cause of diseases classified elsewhere: Secondary | ICD-10-CM | POA: Diagnosis present

## 2020-04-18 DIAGNOSIS — G9341 Metabolic encephalopathy: Secondary | ICD-10-CM | POA: Diagnosis present

## 2020-04-18 DIAGNOSIS — Z7189 Other specified counseling: Secondary | ICD-10-CM | POA: Diagnosis not present

## 2020-04-18 DIAGNOSIS — I5189 Other ill-defined heart diseases: Secondary | ICD-10-CM | POA: Diagnosis not present

## 2020-04-18 DIAGNOSIS — N179 Acute kidney failure, unspecified: Secondary | ICD-10-CM | POA: Diagnosis present

## 2020-04-18 DIAGNOSIS — B370 Candidal stomatitis: Secondary | ICD-10-CM | POA: Diagnosis present

## 2020-04-18 DIAGNOSIS — N184 Chronic kidney disease, stage 4 (severe): Secondary | ICD-10-CM | POA: Diagnosis present

## 2020-04-18 DIAGNOSIS — K219 Gastro-esophageal reflux disease without esophagitis: Secondary | ICD-10-CM | POA: Diagnosis present

## 2020-04-18 DIAGNOSIS — J449 Chronic obstructive pulmonary disease, unspecified: Secondary | ICD-10-CM | POA: Diagnosis present

## 2020-04-18 DIAGNOSIS — R591 Generalized enlarged lymph nodes: Secondary | ICD-10-CM | POA: Diagnosis not present

## 2020-04-18 DIAGNOSIS — E870 Hyperosmolality and hypernatremia: Secondary | ICD-10-CM | POA: Diagnosis present

## 2020-04-18 DIAGNOSIS — F329 Major depressive disorder, single episode, unspecified: Secondary | ICD-10-CM | POA: Diagnosis present

## 2020-04-18 DIAGNOSIS — Z66 Do not resuscitate: Secondary | ICD-10-CM | POA: Diagnosis present

## 2020-04-18 DIAGNOSIS — R1312 Dysphagia, oropharyngeal phase: Secondary | ICD-10-CM | POA: Diagnosis present

## 2020-04-18 DIAGNOSIS — E876 Hypokalemia: Secondary | ICD-10-CM | POA: Diagnosis present

## 2020-04-18 DIAGNOSIS — Z515 Encounter for palliative care: Secondary | ICD-10-CM | POA: Diagnosis present

## 2020-04-18 DIAGNOSIS — Z87891 Personal history of nicotine dependence: Secondary | ICD-10-CM | POA: Diagnosis not present

## 2020-04-18 DIAGNOSIS — I6930 Unspecified sequelae of cerebral infarction: Secondary | ICD-10-CM | POA: Diagnosis not present

## 2020-04-18 DIAGNOSIS — I130 Hypertensive heart and chronic kidney disease with heart failure and stage 1 through stage 4 chronic kidney disease, or unspecified chronic kidney disease: Secondary | ICD-10-CM | POA: Diagnosis present

## 2020-04-18 DIAGNOSIS — Z20822 Contact with and (suspected) exposure to covid-19: Secondary | ICD-10-CM | POA: Diagnosis present

## 2020-04-18 DIAGNOSIS — N390 Urinary tract infection, site not specified: Secondary | ICD-10-CM | POA: Diagnosis present

## 2020-04-18 DIAGNOSIS — R54 Age-related physical debility: Secondary | ICD-10-CM | POA: Diagnosis not present

## 2020-04-18 LAB — BLOOD CULTURE ID PANEL (REFLEXED)

## 2020-04-18 MED ORDER — SODIUM CHLORIDE 0.9 % IV SOLN
2.0000 g | INTRAVENOUS | Status: DC
  Administered 2020-04-18 – 2020-04-19 (×2): 2 g via INTRAVENOUS
  Filled 2020-04-18 (×2): qty 20

## 2020-04-18 MED ORDER — TRAZODONE HCL 50 MG PO TABS
50.0000 mg | ORAL_TABLET | Freq: Every day | ORAL | Status: DC
  Administered 2020-04-18 – 2020-04-28 (×11): 50 mg via ORAL
  Filled 2020-04-18 (×11): qty 1

## 2020-04-18 MED ORDER — SODIUM CHLORIDE 0.9% FLUSH
10.0000 mL | Freq: Two times a day (BID) | INTRAVENOUS | Status: DC
  Administered 2020-04-18 – 2020-04-26 (×12): 10 mL
  Administered 2020-04-26: 20 mL
  Administered 2020-04-27 – 2020-04-29 (×5): 10 mL

## 2020-04-18 MED ORDER — SODIUM CHLORIDE 0.9% FLUSH: 10.0000 mL | INTRAVENOUS | Status: DC | PRN

## 2020-04-18 NOTE — Plan of Care (Signed)
  Problem: Acute Rehab PT Goals(only PT should resolve) Goal: Pt Will Go Supine/Side To Sit Outcome: Progressing Flowsheets (Taken 04/18/2020 0903) Pt will go Supine/Side to Sit:  with minimal assist  with moderate assist Goal: Patient Will Transfer Sit To/From Stand Outcome: Progressing Flowsheets (Taken 04/18/2020 0903) Patient will transfer sit to/from stand: with moderate assist Goal: Pt Will Transfer Bed To Chair/Chair To Bed Outcome: Progressing Flowsheets (Taken 04/18/2020 0903) Pt will Transfer Bed to Chair/Chair to Bed: with mod assist Goal: Pt Will Ambulate Outcome: Progressing Flowsheets (Taken 04/18/2020 0903) Pt will Ambulate:  15 feet  with moderate assist  with rolling walker   9:04 AM, 04/18/20 Lonell Grandchild, MPT Physical Therapist with [facility] [phone] office [phone] mobile phone

## 2020-04-18 NOTE — Progress Notes (Signed)
Lab called with aerobic and anaerobic cultures gram + cocci.  Dr. Maurene Capes notified.   PICC team on their way to insert IV or midline.  Granddaughter has been at bedside today.

## 2020-04-18 NOTE — Evaluation (Signed)
Physical Therapy Evaluation Patient Details Name: Kristie Morris MRN: [id number] DOB: [birth year] Today's Date: 04/18/2020   History of Present Illness  Kristie Morris is a 86 y.o. female with medical history significant for diastolic CHF, COPD, asthma, hypertension, CVA with residual left hemiparesis.Patient was brought to the ED with reports of confusion that started last night.  History is obtained from patient's granddaughter in law who is at bedside and takes care of patient, a limited history from patient herself.  On time of evaluation patient is awake alert oriented to person and place, she is able to answer simple questions but not give me a lot of detail.Patient also has been generally weak, with poor p.o. intake since yesterday.  Patient's urine was also dark, family was concerned that patient had a UTI.  On questioning patient she denies dysuria.  She has a chronic unchanged cough, she denies difficulty breathing.  No chest pain.  Patient also reports dizziness when standing over the past 2 weeks that is getting worse.At baseline patient ambulates with a wheelchair, does not have memory issues/dementia.    Clinical Impression  Patient requires Max assistance to sit up at bedside, unable to maintain sitting balance with frequent falling over to the right and forward, limited to partial standing using RW and unable to lock knees due to weakness.  Patient put back to bed with Max/total assist to reposition.  Patient will benefit from continued physical therapy in hospital and recommended venue below to increase strength, balance, endurance for safe ADLs and gait.    Follow Up Recommendations SNF    Equipment Recommendations  None recommended by PT    Recommendations for Other Services       Precautions / Restrictions Precautions Precautions: Fall Restrictions Weight Bearing Restrictions: No      Mobility  Bed Mobility Overal bed mobility: Needs Assistance Bed Mobility: Supine to  Sit;Sit to Supine     Supine to sit: Max assist Sit to supine: Max assist   General bed mobility comments: slow labored movement, unable to maintain sitting balance due to weakness  Transfers Overall transfer level: Needs assistance Equipment used: Rolling walker (2 wheeled) Transfers: Sit to/from Stand Sit to Stand: Max assist         General transfer comment: limited to partial standing only using RW due to weakness  Ambulation/Gait                Stairs            Wheelchair Mobility    Modified Rankin (Stroke Patients Only)       Balance Overall balance assessment: Needs assistance Sitting-balance support: Feet supported;Bilateral upper extremity supported Sitting balance-Leahy Scale: Poor Sitting balance - Comments: seated at EOB Postural control: Right lateral lean Standing balance support: During functional activity;Bilateral upper extremity supported Standing balance-Leahy Scale: Poor Standing balance comment: partial standing using RW                             Pertinent Vitals/Pain Pain Assessment: No/denies pain    Home Living Family/patient expects to be discharged to:: Private residence Living Arrangements: Other relatives Available Help at Discharge: Family;Available 24 hours/day Type of Home: House Home Access: Level entry     Home Layout: One level Home Equipment: Walker - 2 wheels;Cane - single point;Transport chair;Bedside commode;Shower seat;Grab bars - tub/shower Additional Comments: Patient is poor historian, information taken from previous admission    Prior Function  Level of Independence: Independent with assistive device(s)         Comments: household ambulator pushing wheelchair per chart     Hand Dominance        Extremity/Trunk Assessment   Upper Extremity Assessment Upper Extremity Assessment: Generalized weakness    Lower Extremity Assessment Lower Extremity Assessment: Generalized  weakness    Cervical / Trunk Assessment Cervical / Trunk Assessment: Kyphotic  Communication   Communication: No difficulties  Cognition Arousal/Alertness: Awake/alert Behavior During Therapy: WFL for tasks assessed/performed Overall Cognitive Status: No family/caregiver present to determine baseline cognitive functioning                                        General Comments      Exercises     Assessment/Plan    PT Assessment Patient needs continued PT services  PT Problem List Decreased strength;Decreased activity tolerance;Decreased balance;Decreased mobility       PT Treatment Interventions Gait training;Functional mobility training;Therapeutic activities;Therapeutic exercise;Patient/family education;Balance training    PT Goals (Current goals can be found in the Care Plan section)  Acute Rehab PT Goals Patient Stated Goal: return home after rehab PT Goal Formulation: With patient Time For Goal Achievement: 05/02/20 Potential to Achieve Goals: Good    Frequency Min 3X/week   Barriers to discharge        Co-evaluation               AM-PAC PT "6 Clicks" Mobility  Outcome Measure Help needed turning from your back to your side while in a flat bed without using bedrails?: A Lot Help needed moving from lying on your back to sitting on the side of a flat bed without using bedrails?: A Lot Help needed moving to and from a bed to a chair (including a wheelchair)?: Total Help needed standing up from a chair using your arms (e.g., wheelchair or bedside chair)?: A Lot Help needed to walk in hospital room?: Total Help needed climbing 3-5 steps with a railing? : Total 6 Click Score: 9    End of Session Equipment Utilized During Treatment: Oxygen Activity Tolerance: Patient tolerated treatment well;Patient limited by fatigue Patient left: in bed;with call bell/phone within reach Nurse Communication: Mobility status PT Visit Diagnosis:  Unsteadiness on feet (R26.81);Other abnormalities of gait and mobility (R26.89);Muscle weakness (generalized) (M62.81)    Time: 1324-4010 PT Time Calculation (min) (ACUTE ONLY): 22 min   Charges:   PT Evaluation $PT Eval Moderate Complexity: 1 Mod PT Treatments $Therapeutic Activity: 8-22 mins        9:01 AM, 04/18/20 Lonell Grandchild, MPT Physical Therapist with [facility] [phone] office [phone] mobile phone

## 2020-04-18 NOTE — Care Management Obs Status (Signed)
[facility] NOTIFICATION   Patient Details  Name: Kristie Morris MRN: [id number] Date of Birth: August 10, 1925   Medicare Observation Status Notification Given:  Yes    GREY SCHLAUCH 04/18/2020, 3:37 PM

## 2020-04-18 NOTE — TOC Initial Note (Signed)
Transition of Care [facility]) - Initial/Assessment Note    Patient Details  Name: Kristie Morris MRN: [id number] Date of Birth: May 25, 1925  Transition of [facility]) CM/SW Contact:    Ihor Gully, LCSW Phone Number: 04/18/2020, 5:19 PM  Clinical Narrative:                 Patient from home with grandson and granddaughter in law. Admitted for acute metabolic encephalopathy. At baseline patient is wheelchair bound, uses a lift and sleeps in a hospital bed. She has a bsc and . Patient's bathing, dressing, and grooming is performed by granddaughter in Sports coach. Patient feeds herself most of the time.  Family is unsure if they desire SNF or HH. Grandson to contact TOC to identify desired d/c plan.   Expected Discharge Plan: [place] Barriers to Discharge: Continued Medical Work up   Patient Goals and CMS Choice        Expected Discharge Plan and Services Expected Discharge Plan: [place]       Living arrangements for the past 2 months: Single Family Home                                      Prior Living Arrangements/Services Living arrangements for the past 2 months: Single Family Home Lives with:: Relatives Patient language and need for interpreter reviewed:: Yes        Need for Family Participation in Patient Care: Yes (Comment) Care giver support system in place?: Yes (comment) Current home services: DME Criminal Activity/Legal Involvement Pertinent to Current Situation/Hospitalization: No - Comment as needed  Activities of Daily Living Home Assistive Devices/Equipment: Wheelchair, Hospital bed, Hoyer Lift ADL Screening (condition at time of admission) Patient's cognitive ability adequate to safely complete daily activities?: No Is the patient deaf or have difficulty hearing?: No Does the patient have difficulty seeing, even when wearing glasses/contacts?: Yes Does the patient have difficulty concentrating, remembering, or  making decisions?: Yes Patient able to express need for assistance with ADLs?: Yes Does the patient have difficulty dressing or bathing?: Yes Independently performs ADLs?: No Communication: Independent Dressing (OT): Needs assistance Is this a change from baseline?: Pre-admission baseline Grooming: Needs assistance Is this a change from baseline?: Pre-admission baseline Feeding: Independent Bathing: Dependent Is this a change from baseline?: Pre-admission baseline Toileting: Needs assistance Is this a change from baseline?: Pre-admission baseline In/Out Bed: Needs assistance Is this a change from baseline?: Pre-admission baseline Walks in Home: Dependent Is this a change from baseline?: Pre-admission baseline Does the patient have difficulty walking or climbing stairs?: Yes Weakness of Legs: Both Weakness of Arms/Hands: Both  Permission Sought/Granted Permission sought to share information with : Family Supports    Share Information with NAME: Kalman Shan and Kandyce Rud     Permission granted to share info w Relationship: grandson and granddaughter in Sports coach     Emotional Assessment Appearance:: Appears stated age   Affect (typically observed): Unable to Assess   Alcohol / Substance Use: Not Applicable    Admission diagnosis:  Confusion [R41.0] Acute respiratory failure with hypoxia (HCC) [J96.01] Chest pain [M08.6] Acute metabolic encephalopathy [P61.95] Bacteremia [R78.81] Patient Active Problem List   Diagnosis Date Noted  . Bacteremia due to Gram-positive bacteria 04/18/2020  . Bacteremia 04/18/2020  . Acute metabolic encephalopathy 09/32/6712  . Fall 02/22/2020  . Primary osteoarthritis 01/17/2020  . Post-menopausal 10/24/2019  .  Wheelchair bound 10/24/2019  . Overactive bladder 10/17/2019  . Sciatica of right side 05/29/2015  . Essential hypertension 04/27/2015  . Personal history of stroke with current residual effects 04/27/2015  . Abnormality of gait  01/08/2015  . Recurrent falls 01/08/2015  . Pre-syncope 01/08/2015  . Left-sided weakness 04/02/2014  . Diastolic dysfunction 53/74/8270  . Asthma, chronic 02/02/2014  . COPD (chronic obstructive pulmonary disease) ([place]) 02/02/2014  . Hyperlipidemia 02/02/2014   PCP:  [name], NP Pharmacy:   [facility] [address], [place] - [place] [address] [place] [address] Phone: [phone] Fax: [place], [place] - [address] [place] [address] Phone: [phone] Fax: [phone]     Social Determinants of Health (SDOH) Interventions    Readmission Risk Interventions No flowsheet data found.

## 2020-04-18 NOTE — Progress Notes (Addendum)
Patient Demographics:    Madelon Welsch, is a 86 y.o. female, DOB - 1925-03-25, JKQ:[id number]  Admit date - 04/17/2020   Admitting Physician Ejiroghene Arlyce Dice, MD  Outpatient Primary MD for the patient is [name], NP  LOS - 0   Chief Complaint  Patient presents with  . possible UTI        Subjective:    [name] today has no fevers, no emesis,  No chest pain,   -- remains confused, disoriented  Grand- Daughter  Apolonio Schneiders at bedside -Also called and discussed case with patient's POA Ysidro Evert -Both Ysidro Evert and Apolonio Schneiders tell me that at baseline patient is usually coherent and alert, patient's current mental status is a drastic change from her usual baseline  Assessment  & Plan :    Principal Problem:   Acute metabolic encephalopathy Active Problems:   Bacteremia due to Gram-positive bacteria   COPD (chronic obstructive pulmonary disease) ([place])   Personal history of stroke with current residual effects   Wheelchair bound   Asthma, chronic   Essential hypertension   Bacteremia  Brief Summary:- 86 y.o. female with medical history significant for diastolic CHF, COPD, asthma, hypertension, CVA with residual left hemiparesis admitted on 04/17/20 with altered mentation/disorientation in the setting of presumed UTI and GPC bacteremia altered mentation and disorientation in the setting of presumed UTI and GPC bacteremia  A/p 1) acute metabolic encephalopathy--due to dehydration, AKI and presumed UTI and GPC bacteremia --Anticipate improvement with improvement of listed conditions -Grand- Daughter  Apolonio Schneiders at bedside -Also called and discussed case with patient's POA Ysidro Evert -Both Ysidro Evert and Apolonio Schneiders tell me that at baseline patient is usually coherent and alert, patient's current mental status is a drastic change from her usual baseline  2) GPC bacteremia--- IV Rocephin pending further culture data -No  fevers, no tachypnea, no tachycardia, no leukocytosis, lactic acid is not elevated--- patient does not meet sepsis criteria  3) presumed UTI--- IV Rocephin pending additional culture data  4)AKI----acute kidney injury due to dehydration and concomitant meloxicam use -- creatinine on admission= 1.55  , baseline creatinine = 1.1 (10/27/19)   ---, renally adjust medications, avoid nephrotoxic agents / dehydration  / hypotension  -.hydrate  IV and p.o.  5) poor venous access--limiting ability to give IV fluids and IV antibiotics, request for ultrasound assisted peripheral line or midline placement  6) ambulatory dysfunction--- at baseline patient was able to stand and pivot but she gets around with a wheelchair she does not walk -PT eval appreciated, SNF recommended however patient may be able to go home with home health PT and OT  7) prior history of stroke more than 5 years ago with residual left-sided hemiparesis--- please see #6 above -Continue aspirin and crestor for secondary stroke prophylaxis - 8)Depression--- stable continue Cymbalta, and trazodone  Disposition/Need for in-Hospital Stay- patient unable to be discharged at this time due to -acute metabolic encephalopathy secondary to [place] bacteremia and presumed UTI , as well as acute kidney injury and dehydration requiring IV fluids and IV antibiotics  Status is: Inpatient  Remains inpatient appropriate because:acute metabolic encephalopathy secondary to GPC bacteremia and presumed UTI , as well as acute kidney injury and dehydration requiring IV fluids and IV antibiotics  Disposition:  The patient is from: Home              Anticipated d/c is to: Home              Anticipated d/c date is: 2 days              Patient currently is not medically stable to d/c. Barriers: Not Clinically Stable- -acute metabolic encephalopathy secondary to GPC bacteremia and presumed UTI , as well as acute kidney injury and dehydration requiring IV fluids  and IV antibiotics  Code Status : Full  Family Communication:     (patient is alert, awake and coherent)   Consults  :  na  DVT Prophylaxis  :   - Heparin - SCDs   Lab Results  Component Value Date   PLT 316 04/17/2020    Inpatient Medications  Scheduled Meds: . amLODipine  5 mg Oral Daily  . aspirin EC  81 mg Oral Daily  . heparin  5,000 Units Subcutaneous Q8H  . oxybutynin  10 mg Oral QHS  . pantoprazole  40 mg Oral Q1500  . rosuvastatin  5 mg Oral QPM   Continuous Infusions: . sodium chloride    . cefTRIAXone (ROCEPHIN)  IV     PRN Meds:.acetaminophen **OR** acetaminophen, albuterol, ondansetron **OR** ondansetron (ZOFRAN) IV, polyethylene glycol    Anti-infectives (From admission, onward)   Start     Dose/Rate Route Frequency Ordered Stop   04/18/20 1645  cefTRIAXone (ROCEPHIN) 2 g in sodium chloride 0.9 % 100 mL IVPB     Discontinue     2 g 200 mL/hr over 30 Minutes Intravenous Every 24 hours 04/18/20 1625     04/17/20 1530  cefTRIAXone (ROCEPHIN) 1 g in sodium chloride 0.9 % 100 mL IVPB  Status:  Discontinued        1 g 200 mL/hr over 30 Minutes Intravenous Every 24 hours 04/17/20 1517 04/17/20 2212        Objective:   Vitals:   04/17/20 2214 04/18/20 0206 04/18/20 0543 04/18/20 1346  BP: (!) 145/93 (!) 128/59 110/66 (!) 141/77  Pulse: 86 87 83 90  Resp: 20 20 15 16   Temp: 98.2 F (36.8 C) 97.6 F (36.4 C) 98.4 F (36.9 C) 98.4 F (36.9 C)  TempSrc: Oral Oral Axillary   SpO2: 99% 95% 99% 93%  Weight:      Height:        Wt Readings from Last 3 Encounters:  04/17/20 83 kg  02/21/20 83.5 kg  02/21/20 83.5 kg     Intake/Output Summary (Last 24 hours) at 04/18/2020 1634 Last data filed at 04/18/2020 0900 Gross per 24 hour  Intake 120 ml  Output --  Net 120 ml     Physical Exam  Gen:- Awake , confused and disoriented HEENT:- [place].AT, No sclera icterus Neck-Supple Neck,No JVD,.  Lungs-  CTAB , fair symmetrical air movement CV- S1, S2  normal, regular  Abd-  +ve B.Sounds, Abd Soft, No tenderness, no CVA area tenderness    Extremity/Skin:- No  edema, pedal pulses present  Psych-affect is disoriented and confused  neuro-residual left-sided hemiparesis, no new focal deficits, no tremors   Data Review:   Micro Results Recent Results (from the past 240 hour(s))  SARS Coronavirus 2 by RT PCR (hospital order, performed in [facility] hospital lab) Nasopharyngeal Nasopharyngeal Swab     Status: None   Collection Time: 04/17/20  3:43 PM   Specimen: Nasopharyngeal Swab  Result Value  Ref Range Status   SARS Coronavirus 2 NEGATIVE NEGATIVE Final    Comment: (NOTE) SARS-CoV-2 target nucleic acids are NOT DETECTED.  The SARS-CoV-2 RNA is generally detectable in upper and lower respiratory specimens during the acute phase of infection. The lowest concentration of SARS-CoV-2 viral copies this assay can detect is 250 copies / mL. A negative result does not preclude SARS-CoV-2 infection and should not be used as the sole basis for treatment or other patient management decisions.  A negative result may occur with improper specimen collection / handling, submission of specimen other than nasopharyngeal swab, presence of viral mutation(s) within the areas targeted by this assay, and inadequate number of viral copies (<250 copies / mL). A negative result must be combined with clinical observations, patient history, and epidemiological information.  Fact Sheet for Patients:   StrictlyIdeas.no  Fact Sheet for Healthcare Providers: BankingDealers.co.za  This test is not yet approved or  cleared by the [place] FDA and has been authorized for detection and/or diagnosis of SARS-CoV-2 by FDA under an Emergency Use Authorization (EUA).  This EUA will remain in effect (meaning this test can be used) for the duration of the COVID-19 declaration under Section 564(b)(1) of the Act, 21  U.S.C. section 360bbb-3(b)(1), unless the authorization is terminated or revoked sooner.  Performed at [facility], [address], [place], [place] [address]   Blood Culture (routine x 2)     Status: None (Preliminary result)   Collection Time: 04/17/20  4:19 PM   Specimen: Right Antecubital; Blood  Result Value Ref Range Status   Specimen Description RIGHT ANTECUBITAL  Final   Special Requests   Final    BOTTLES DRAWN AEROBIC AND ANAEROBIC Blood Culture adequate volume   Culture   Final    NO GROWTH < 24 HOURS Performed at [facility], [address], [place], [place] [address]    Report Status PENDING  Incomplete  Blood Culture (routine x 2)     Status: None (Preliminary result)   Collection Time: 04/17/20  4:24 PM   Specimen: Left Antecubital; Blood  Result Value Ref Range Status   Specimen Description LEFT ANTECUBITAL  Final   Special Requests   Final    BOTTLES DRAWN AEROBIC AND ANAEROBIC Blood Culture adequate volume   Culture  Setup Time   Final    BOTH BOTTLES GRAM POSITIVE COCCI Gram Stain Report Called to,Read Back By and Verified With: Lowry Ram 04/18/20 @1619  BY JONEST     Culture   Final    NO GROWTH < 24 HOURS Performed at [facility], [address] [place],  [address]    Report Status PENDING  Incomplete    Radiology Reports DG Chest 1 View  Result Date: 04/17/2020 CLINICAL DATA:  Chest pain, altered mental status EXAM: CHEST  1 VIEW COMPARISON:  02/21/2020 FINDINGS: The generalized lung apices, slightly limiting evaluation. The lungs are symmetrically expanded and are clear. No definite pneumothorax. No pleural effusion. The cardiomediastinal silhouette is unremarkable. Pulmonary vascularity is normal. Surgical clips are noted within the axilla bilaterally. No acute bone abnormality IMPRESSION: No active disease. Electronically Signed   By: Fidela Salisbury MD   On: 04/17/2020 15:11   CT Head Wo Contrast  Result Date: 04/17/2020 CLINICAL DATA:   Delirium EXAM: CT HEAD WITHOUT CONTRAST TECHNIQUE: Contiguous axial images were obtained from the base of the skull through the vertex without intravenous contrast. COMPARISON:  Feb 21, 2020 FINDINGS: Brain: No evidence of acute territorial infarction,  hemorrhage, hydrocephalus,extra-axial collection or mass lesion/mass effect. There is dilatation the ventricles and sulci consistent with age-related atrophy. Low-attenuation changes in the deep white matter consistent with small vessel ischemia. Prior lacunar infarct seen within the right basal ganglia. Vascular: No hyperdense vessel or unexpected calcification. Skull: The skull is intact. No fracture or focal lesion identified. Sinuses/Orbits: The visualized paranasal sinuses and mastoid air cells are clear. The orbits and globes intact. Other: None IMPRESSION: No acute intracranial abnormality. Findings consistent with age related atrophy and chronic small vessel ischemia Electronically Signed   By: Prudencio Pair M.D.   On: 04/17/2020 17:00   [place] EKG SITE RITE  Result Date: 04/18/2020 If Site Rite image not attached, placement could not be confirmed due to current cardiac rhythm.    CBC Recent Labs  Lab 04/17/20 1619  WBC 9.2  HGB 10.5*  HCT 34.5*  PLT 316  MCV 86.5  MCH 26.3  MCHC 30.4  RDW 15.1  LYMPHSABS 0.9  MONOABS 0.7  EOSABS 0.7*  BASOSABS 0.1    Chemistries  Recent Labs  Lab 04/17/20 1619  NA 134*  K 3.8  CL 96*  CO2 29  GLUCOSE 112*  BUN 34*  CREATININE 1.55*  CALCIUM 12.7*  MG 1.9  AST 20  ALT 13  ALKPHOS 100  BILITOT 0.5   ------------------------------------------------------------------------------------------------------------------ No results for input(s): CHOL, HDL, LDLCALC, TRIG, CHOLHDL, LDLDIRECT in the last 72 hours.  No results found for: HGBA1C ------------------------------------------------------------------------------------------------------------------ No results for input(s): TSH, T4TOTAL,  T3FREE, THYROIDAB in the last 72 hours.  Invalid input(s): FREET3 ------------------------------------------------------------------------------------------------------------------ No results for input(s): VITAMINB12, FOLATE, FERRITIN, TIBC, IRON, RETICCTPCT in the last 72 hours.  Coagulation profile Recent Labs  Lab 04/17/20 1619  INR 1.0    No results for input(s): DDIMER in the last 72 hours.  Cardiac Enzymes No results for input(s): CKMB, TROPONINI, MYOGLOBIN in the last 168 hours.  Invalid input(s): CK ------------------------------------------------------------------------------------------------------------------    Component Value Date/Time   BNP 21.0 02/21/2020 1614     Karne Ozga M.D on 04/18/2020 at 4:34 PM  Go to [email or web address] - for contact info  [facility] - Office  [phone]

## 2020-04-19 MED ORDER — SODIUM CHLORIDE 0.9 % IV SOLN
1.0000 g | INTRAVENOUS | Status: AC
  Administered 2020-04-19 – 2020-04-24 (×6): 1 g via INTRAVENOUS
  Filled 2020-04-19 (×7): qty 1

## 2020-04-19 MED ORDER — SODIUM CHLORIDE 0.9 % IV SOLN: INTRAVENOUS | Status: DC

## 2020-04-19 MED ORDER — ORAL CARE MOUTH RINSE
15.0000 mL | Freq: Two times a day (BID) | OROMUCOSAL | Status: DC
  Administered 2020-04-20 – 2020-04-28 (×11): 15 mL via OROMUCOSAL

## 2020-04-19 MED ORDER — CHLORHEXIDINE GLUCONATE 0.12 % MT SOLN
15.0000 mL | Freq: Two times a day (BID) | OROMUCOSAL | Status: DC
  Administered 2020-04-19 – 2020-04-29 (×18): 15 mL via OROMUCOSAL
  Filled 2020-04-19 (×15): qty 15

## 2020-04-19 NOTE — Progress Notes (Signed)
Pharmacy Antibiotic Note  Kristie Morris is a 86 y.o. female admitted on 04/17/2020 with confusion and later diagnosed with pseudomonal UTI.  Pharmacy has been consulted for cefepime dosing.  Patient is currently afebrile, last WBC WNL, last lactate 1.1. Patient has been diagnosed with AKI (last SCr 1.55; baseline ~1.1). Will adjust dosing of cefepime for renal dysfunction and reassess as renal function improves.  Plan: Start cefepime 1g IV q24h Monitor clinical picture, renal function F/U C&S, abx de-escalation, LOT   Height: 5\' 4"  (162.6 cm) Weight: 83 kg (182 lb 15.7 oz) IBW/kg (Calculated) : 54.7  Temp (24hrs), Avg:98.9 F (37.2 C), Min:98.6 F (37 C), Max:99.3 F (37.4 C)  Recent Labs  Lab 04/17/20 1619  WBC 9.2  CREATININE 1.55*  LATICACIDVEN 1.1    Estimated Creatinine Clearance: 23.6 mL/min (A) (by C-G formula based on SCr of 1.55 mg/dL (H)).    Allergies  Allergen Reactions  . Zithromax [Azithromycin] Diarrhea    Pt in hosp for 2 weeks   . Celebrex [Celecoxib] Hives  . Ciprofloxacin Hives  . Metronidazole Hives  . Morphine And Related Rash  . Moxifloxacin Hives  . Penicillins Hives  . Polymyxin B Hives  . Sulfa Antibiotics Hives  . Vancomycin Hives  . Vioxx [Rofecoxib] Hives    Antimicrobials this admission: Cefepime 7/16> CTX 7/14>7/16  Microbiology results: 7/14 BCx GPCs- pending    - BCID w MR CONS (possible contaminant) 7/14 UCx 30k Pseudomonas   Brendolyn Patty, PharmD Clinical Pharmacist  04/19/2020   8:00 PM   Please check AMION for all [place] phone numbers After 10:00 PM, call the [place] [phone]

## 2020-04-19 NOTE — Progress Notes (Signed)
Physical Therapy Treatment Patient Details Name: Kristie Morris MRN: [id number] DOB: [birth year] Today's Date: 04/19/2020    History of Present Illness Kristie Morris is a 86 y.o. female with medical history significant for diastolic CHF, COPD, asthma, hypertension, CVA with residual left hemiparesis.Patient was brought to the ED with reports of confusion that started last night.  History is obtained from patient's granddaughter in law who is at bedside and takes care of patient, a limited history from patient herself.  On time of evaluation patient is awake alert oriented to person and place, she is able to answer simple questions but not give me a lot of detail.Patient also has been generally weak, with poor p.o. intake since yesterday.  Patient's urine was also dark, family was concerned that patient had a UTI.  On questioning patient she denies dysuria.  She has a chronic unchanged cough, she denies difficulty breathing.  No chest pain.  Patient also reports dizziness when standing over the past 2 weeks that is getting worse.At baseline patient ambulates with a wheelchair, does not have memory issues/dementia.    PT Comments    Patient demonstrates slightly increased strength for sitting up at bedside with improvement for using BUE, had difficulty moving legs due to weakness, frequent leaning forward and to the right while seated at bedside, after verbal/tactile cueing patient able to support self with RUE maintaining sitting balance for up to 2-3 minutes before falling forward.  Patient limited due to c/o low back pain resulting in falling backwards and put back to bed with Max assist to reposition.  Patient will benefit from continued physical therapy in hospital and recommended venue below to increase strength, balance, endurance for safe ADLs and gait.    Follow Up Recommendations  SNF     Equipment Recommendations  None recommended by PT    Recommendations for Other Services        Precautions / Restrictions Precautions Precautions: Fall Restrictions Weight Bearing Restrictions: No    Mobility  Bed Mobility Overal bed mobility: Needs Assistance Bed Mobility: Supine to Sit;Sit to Supine     Supine to sit: Max assist;Mod assist Sit to supine: Max assist   General bed mobility comments: demonstrates slightly increased strength for supine to sitting  Transfers                    Ambulation/Gait                 Stairs             Wheelchair Mobility    Modified Rankin (Stroke Patients Only)       Balance Overall balance assessment: Needs assistance Sitting-balance support: Feet supported;Bilateral upper extremity supported Sitting balance-Leahy Scale: Fair Sitting balance - Comments: fair/poor seated at EOB Postural control: Right lateral lean                                  Cognition Arousal/Alertness: Awake/alert Behavior During Therapy: WFL for tasks assessed/performed Overall Cognitive Status: Within Functional Limits for tasks assessed                                        Exercises General Exercises - Lower Extremity Ankle Circles/Pumps: Seated;AROM;Strengthening;Both;10 reps Long Arc Quad: Seated;AROM;Strengthening;Both;10 reps Hip ABduction/ADduction: Seated;AROM;Strengthening;Both;10 reps    General Comments  Pertinent Vitals/Pain Pain Assessment: Faces Faces Pain Scale: Hurts little more Pain Location: low back while seated at bedside Pain Descriptors / Indicators: Aching;Grimacing;Sore Pain Intervention(s): Limited activity within patient's tolerance;Monitored during session;Repositioned    Home Living                      Prior Function            PT Goals (current goals can now be found in the care plan section) Acute Rehab PT Goals Patient Stated Goal: return home after rehab PT Goal Formulation: With patient Time For Goal Achievement:  05/02/20 Potential to Achieve Goals: Good Progress towards PT goals: Progressing toward goals    Frequency    Min 3X/week      PT Plan Current plan remains appropriate    Co-evaluation              AM-PAC PT "6 Clicks" Mobility   Outcome Measure  Help needed turning from your back to your side while in a flat bed without using bedrails?: A Lot Help needed moving from lying on your back to sitting on the side of a flat bed without using bedrails?: A Lot Help needed moving to and from a bed to a chair (including a wheelchair)?: Total Help needed standing up from a chair using your arms (e.g., wheelchair or bedside chair)?: Total Help needed to walk in hospital room?: Total Help needed climbing 3-5 steps with a railing? : Total 6 Click Score: 8    End of Session Equipment Utilized During Treatment: Oxygen Activity Tolerance: Patient tolerated treatment well;Patient limited by fatigue Patient left: in bed;with call bell/phone within reach;with family/visitor present Nurse Communication: Mobility status PT Visit Diagnosis: Unsteadiness on feet (R26.81);Other abnormalities of gait and mobility (R26.89);Muscle weakness (generalized) (M62.81)     Time: 1610-9604 PT Time Calculation (min) (ACUTE ONLY): 30 min  Charges:  $Therapeutic Exercise: 8-22 mins $Therapeutic Activity: 8-22 mins                     12:32 PM, 04/19/20 Lonell Grandchild, MPT Physical Therapist with [facility] 336 [phone] office [phone] mobile phone

## 2020-04-19 NOTE — Progress Notes (Signed)
Patient Demographics:    Kristie Morris, is a 86 y.o. female, DOB - 02/24/1925, FIE:[id number]  Admit date - 04/17/2020   Admitting Physician Rashika Bettes Denton Brick, MD  Outpatient Primary MD for the patient is [name], NP  LOS - 1   Chief Complaint  Patient presents with  . possible UTI        Subjective:    Kristie Morris today has no fevers, no emesis,  No chest pain,   -- remains confused, disoriented  -As per family patient is still not back to baseline from a cognitive standpoint -Having some difficulties with swallowing  Assessment  & Plan :    Principal Problem:   Acute metabolic encephalopathy Active Problems:   Bacteremia due to Gram-positive bacteria   COPD (chronic obstructive pulmonary disease) ([place])   Personal history of stroke with current residual effects   Wheelchair bound   Asthma, chronic   Essential hypertension   Bacteremia  Brief Summary:- 86 y.o. female with medical history significant for diastolic CHF, COPD, asthma, hypertension, CVA with residual left hemiparesis admitted on 04/17/20 with altered mentation/disorientation in the setting of presumed UTI and GPC bacteremia altered mentation and disorientation in the setting of presumed UTI and GPC bacteremia  A/p 1)Acute metabolic encephalopathy--due to dehydration, AKI and presumed UTI and GPC bacteremia --Anticipate improvement with improvement of listed conditions -As per family patient is still not back to baseline from a cognitive standpoint  2)CONS bacteremia---??? Contaminants--- treated initially with IV Rocephin-no further antibiotics required for this particular indication -No fevers, no tachypnea, no tachycardia, no leukocytosis, lactic acid is not elevated--- patient does not meet sepsis criteria  3) Pseudomonas-UTI--- start IV cefepime on 04/19/2020 pending further culture data  4)AKI----acute kidney injury  due to dehydration and concomitant meloxicam use -- creatinine on admission= 1.55  , baseline creatinine = 1.1 (10/27/19)   ---, renally adjust medications, avoid nephrotoxic agents / dehydration  / hypotension  -.hydrate  IV and p.o.  5) poor venous access--limiting ability to give IV fluids and IV antibiotics,-had  ultrasound assisted midline placement  6) ambulatory dysfunction--- at baseline patient was able to stand and pivot but she gets around with a wheelchair she does not walk -PT eval appreciated, SNF recommended however patient may be able to go home with home health PT and OT  7) prior history of stroke more than 5 years ago with residual left-sided hemiparesis--- please see #6 above -Continue aspirin and crestor for secondary stroke prophylaxis - 8)Depression--- stable continue Cymbalta, and trazodone  9) dysphagia--speech and swallow evaluation requested  Disposition/Need for in-Hospital Stay- patient unable to be discharged at this time due to -acute metabolic encephalopathy secondary to Pseudomonas UTI, as well as acute kidney injury and dehydration requiring IV fluids and IV antibiotics  Status is: Inpatient  Remains inpatient appropriate because:acute metabolic encephalopathy secondary to Pseudomonas UTI, as well as acute kidney injury and dehydration requiring IV fluids and IV antibiotics  Disposition: The patient is from: Home              Anticipated d/c is to: Home              Anticipated d/c date is: 2 days  Patient currently is not medically stable to d/c. Barriers: Not Clinically Stable- -acute metabolic encephalopathy secondary to Pseudomonas UTI, as well as acute kidney injury and dehydration requiring IV fluids and IV antibiotics  Code Status : Full  Family Communication: discussed with grandson and granddaughter  Consults  :  na  DVT Prophylaxis  :   - Heparin - SCDs   Lab Results  Component Value Date   PLT 316 04/17/2020     Inpatient Medications  Scheduled Meds: . amLODipine  5 mg Oral Daily  . aspirin EC  81 mg Oral Daily  . chlorhexidine  15 mL Mouth Rinse BID  . heparin  5,000 Units Subcutaneous Q8H  . [START ON 04/20/2020] mouth rinse  15 mL Mouth Rinse q12n4p  . oxybutynin  10 mg Oral QHS  . pantoprazole  40 mg Oral Q1500  . rosuvastatin  5 mg Oral QPM  . sodium chloride flush  10-40 mL Intracatheter Q12H  . traZODone  50 mg Oral QHS   Continuous Infusions: . sodium chloride 75 mL/hr at 04/19/20 1601  . cefTRIAXone (ROCEPHIN)  IV Stopped (04/19/20 1630)   PRN Meds:.acetaminophen **OR** acetaminophen, albuterol, ondansetron **OR** ondansetron (ZOFRAN) IV, polyethylene glycol, sodium chloride flush    Anti-infectives (From admission, onward)   Start     Dose/Rate Route Frequency Ordered Stop   04/18/20 1645  cefTRIAXone (ROCEPHIN) 2 g in sodium chloride 0.9 % 100 mL IVPB     Discontinue     2 g 200 mL/hr over 30 Minutes Intravenous Every 24 hours 04/18/20 1625     04/17/20 1530  cefTRIAXone (ROCEPHIN) 1 g in sodium chloride 0.9 % 100 mL IVPB  Status:  Discontinued        1 g 200 mL/hr over 30 Minutes Intravenous Every 24 hours 04/17/20 1517 04/17/20 2212        Objective:   Vitals:   04/18/20 2031 04/18/20 2033 04/19/20 0428 04/19/20 1600  BP: (!) 151/61  131/87 116/64  Pulse: (!) 103  (!) 107 95  Resp: 16  16 12   Temp: 98.6 F (37 C)  98.9 F (37.2 C) 99.3 F (37.4 C)  TempSrc: Oral  Oral Oral  SpO2: 95% 95% 92% 95%  Weight:      Height:        Wt Readings from Last 3 Encounters:  04/17/20 83 kg  02/21/20 83.5 kg  02/21/20 83.5 kg     Intake/Output Summary (Last 24 hours) at 04/19/2020 1926 Last data filed at 04/19/2020 1805 Gross per 24 hour  Intake 355 ml  Output 1100 ml  Net -745 ml     Physical Exam  Gen:- Awake , confused and disoriented HEENT:- [place].AT, No sclera icterus Neck-Supple Neck,No JVD,.  Lungs-  CTAB , fair symmetrical air movement CV- S1, S2  normal, regular  Abd-  +ve B.Sounds, Abd Soft, No tenderness, no CVA area tenderness    Extremity/Skin:- No  edema, pedal pulses present  Psych-affect is disoriented and confused  neuro-residual left-sided hemiparesis, no new focal deficits, no tremors   Data Review:   Micro Results Recent Results (from the past 240 hour(s))  Urine culture     Status: Abnormal (Preliminary result)   Collection Time: 04/17/20  2:34 PM   Specimen: In/Out Cath Urine  Result Value Ref Range Status   Specimen Description   Final    IN/OUT CATH URINE Performed at [facility], [address], [place], [place] [address]    Special Requests  Final    NONE Performed at [facility], [address] [place], [place] [address]    Culture (A)  Final    30,000 COLONIES/mL PSEUDOMONAS AERUGINOSA SUSCEPTIBILITIES TO FOLLOW Performed at [facility], [place] [address] [place], [place] [address]    Report Status PENDING  Incomplete  SARS Coronavirus 2 by RT PCR (hospital order, performed in [facility] hospital lab) Nasopharyngeal Nasopharyngeal Swab     Status: None   Collection Time: 04/17/20  3:43 PM   Specimen: Nasopharyngeal Swab  Result Value Ref Range Status   SARS Coronavirus 2 NEGATIVE NEGATIVE Final    Comment: (NOTE) SARS-CoV-2 target nucleic acids are NOT DETECTED.  The SARS-CoV-2 RNA is generally detectable in upper and lower respiratory specimens during the acute phase of infection. The lowest concentration of SARS-CoV-2 viral copies this assay can detect is 250 copies / mL. A negative result does not preclude SARS-CoV-2 infection and should not be used as the sole basis for treatment or other patient management decisions.  A negative result may occur with improper specimen collection / handling, submission of specimen other than nasopharyngeal swab, presence of viral mutation(s) within the areas targeted by this assay, and inadequate number of viral copies (<250 copies / mL). A negative  result must be combined with clinical observations, patient history, and epidemiological information.  Fact Sheet for Patients:   StrictlyIdeas.no  Fact Sheet for Healthcare Providers: BankingDealers.co.za  This test is not yet approved or  cleared by the [place] FDA and has been authorized for detection and/or diagnosis of SARS-CoV-2 by FDA under an Emergency Use Authorization (EUA).  This EUA will remain in effect (meaning this test can be used) for the duration of the COVID-19 declaration under Section 564(b)(1) of the Act, 21 U.S.C. section 360bbb-3(b)(1), unless the authorization is terminated or revoked sooner.  Performed at [facility], [address], [place], [place] [address]   Blood Culture (routine x 2)     Status: None (Preliminary result)   Collection Time: 04/17/20  4:19 PM   Specimen: Right Antecubital; Blood  Result Value Ref Range Status   Specimen Description RIGHT ANTECUBITAL  Final   Special Requests   Final    BOTTLES DRAWN AEROBIC AND ANAEROBIC Blood Culture adequate volume   Culture   Final    NO GROWTH 2 DAYS Performed at [facility], [address], [place], [place] [address]    Report Status PENDING  Incomplete  Blood Culture (routine x 2)     Status: None (Preliminary result)   Collection Time: 04/17/20  4:24 PM   Specimen: Left Antecubital; Blood  Result Value Ref Range Status   Specimen Description   Final    LEFT ANTECUBITAL Performed at [facility], [address] [place], [place] [address]    Special Requests   Final    BOTTLES DRAWN AEROBIC AND ANAEROBIC Blood Culture adequate volume Performed at [facility], [address] [place], [place] [address]    Culture  Setup Time   Final    BOTH BOTTLES GRAM POSITIVE COCCI Gram Stain Report Called to,Read Back By and Verified With: Lowry Ram 04/18/20 @1619  BY JONEST  CRITICAL RESULT CALLED TO, READ BACK BY AND VERIFIED WITH: D,JONES RN  @2257  04/18/20 EB Performed at [facility], [place] [address] [place], [place] [address]    Culture [facility] POSITIVE COCCI  Final   Report Status PENDING  Incomplete  Blood Culture ID Panel (Reflexed)  Status: Abnormal   Collection Time: 04/17/20  4:24 PM  Result Value Ref Range Status   Enterococcus species NOT DETECTED NOT DETECTED Final   Listeria monocytogenes NOT DETECTED NOT DETECTED Final   Staphylococcus species DETECTED (A) NOT DETECTED Final    Comment: Methicillin (oxacillin) resistant coagulase negative staphylococcus. Possible blood culture contaminant (unless isolated from more than one blood culture draw or clinical case suggests pathogenicity). No antibiotic treatment is indicated for blood  culture contaminants. CRITICAL RESULT CALLED TO, READ BACK BY AND VERIFIED WITH: D,JONES RN @2257  04/18/20 EB    Staphylococcus aureus (BCID) NOT DETECTED NOT DETECTED Final   Methicillin resistance DETECTED (A) NOT DETECTED Final    Comment: CRITICAL RESULT CALLED TO, READ BACK BY AND VERIFIED WITH: D,JONES RN @2257  04/18/20 EB    Streptococcus species NOT DETECTED NOT DETECTED Final   Streptococcus agalactiae NOT DETECTED NOT DETECTED Final   Streptococcus pneumoniae NOT DETECTED NOT DETECTED Final   Streptococcus pyogenes NOT DETECTED NOT DETECTED Final   Acinetobacter baumannii NOT DETECTED NOT DETECTED Final   Enterobacteriaceae species NOT DETECTED NOT DETECTED Final   Enterobacter cloacae complex NOT DETECTED NOT DETECTED Final   Escherichia coli NOT DETECTED NOT DETECTED Final   Klebsiella oxytoca NOT DETECTED NOT DETECTED Final   Klebsiella pneumoniae NOT DETECTED NOT DETECTED Final   Proteus species NOT DETECTED NOT DETECTED Final   Serratia marcescens NOT DETECTED NOT DETECTED Final   Haemophilus influenzae NOT DETECTED NOT DETECTED Final   Neisseria meningitidis NOT DETECTED NOT DETECTED Final   Pseudomonas aeruginosa NOT DETECTED NOT DETECTED Final   Candida  albicans NOT DETECTED NOT DETECTED Final   Candida glabrata NOT DETECTED NOT DETECTED Final   Candida krusei NOT DETECTED NOT DETECTED Final   Candida parapsilosis NOT DETECTED NOT DETECTED Final   Candida tropicalis NOT DETECTED NOT DETECTED Final    Comment: Performed at [facility], [place]. [address] [place], [place] [address]    Radiology Reports DG Chest 1 View  Result Date: 04/17/2020 CLINICAL DATA:  Chest pain, altered mental status EXAM: CHEST  1 VIEW COMPARISON:  02/21/2020 FINDINGS: The generalized lung apices, slightly limiting evaluation. The lungs are symmetrically expanded and are clear. No definite pneumothorax. No pleural effusion. The cardiomediastinal silhouette is unremarkable. Pulmonary vascularity is normal. Surgical clips are noted within the axilla bilaterally. No acute bone abnormality IMPRESSION: No active disease. Electronically Signed   By: Fidela Salisbury MD   On: 04/17/2020 15:11   CT Head Wo Contrast  Result Date: 04/17/2020 CLINICAL DATA:  Delirium EXAM: CT HEAD WITHOUT CONTRAST TECHNIQUE: Contiguous axial images were obtained from the base of the skull through the vertex without intravenous contrast. COMPARISON:  Feb 21, 2020 FINDINGS: Brain: No evidence of acute territorial infarction, hemorrhage, hydrocephalus,extra-axial collection or mass lesion/mass effect. There is dilatation the ventricles and sulci consistent with age-related atrophy. Low-attenuation changes in the deep white matter consistent with small vessel ischemia. Prior lacunar infarct seen within the right basal ganglia. Vascular: No hyperdense vessel or unexpected calcification. Skull: The skull is intact. No fracture or focal lesion identified. Sinuses/Orbits: The visualized paranasal sinuses and mastoid air cells are clear. The orbits and globes intact. Other: None IMPRESSION: No acute intracranial abnormality. Findings consistent with age related atrophy and chronic small vessel ischemia  Electronically Signed   By: Prudencio Pair M.D.   On: 04/17/2020 17:00   [place] EKG SITE RITE  Result Date: 04/18/2020 If Site Rite image not attached, placement could not  be confirmed due to current cardiac rhythm.    CBC Recent Labs  Lab 04/17/20 1619  WBC 9.2  HGB 10.5*  HCT 34.5*  PLT 316  MCV 86.5  MCH 26.3  MCHC 30.4  RDW 15.1  LYMPHSABS 0.9  MONOABS 0.7  EOSABS 0.7*  BASOSABS 0.1    Chemistries  Recent Labs  Lab 04/17/20 1619  NA 134*  K 3.8  CL 96*  CO2 29  GLUCOSE 112*  BUN 34*  CREATININE 1.55*  CALCIUM 12.7*  MG 1.9  AST 20  ALT 13  ALKPHOS 100  BILITOT 0.5   ------------------------------------------------------------------------------------------------------------------ No results for input(s): CHOL, HDL, LDLCALC, TRIG, CHOLHDL, LDLDIRECT in the last 72 hours.  No results found for: HGBA1C ------------------------------------------------------------------------------------------------------------------ No results for input(s): TSH, T4TOTAL, T3FREE, THYROIDAB in the last 72 hours.  Invalid input(s): FREET3 ------------------------------------------------------------------------------------------------------------------ No results for input(s): VITAMINB12, FOLATE, FERRITIN, TIBC, IRON, RETICCTPCT in the last 72 hours.  Coagulation profile Recent Labs  Lab 04/17/20 1619  INR 1.0    No results for input(s): DDIMER in the last 72 hours.  Cardiac Enzymes No results for input(s): CKMB, TROPONINI, MYOGLOBIN in the last 168 hours.  Invalid input(s): CK ------------------------------------------------------------------------------------------------------------------    Component Value Date/Time   BNP 21.0 02/21/2020 1614   Daizy Outen M.D on 04/19/2020 at 7:26 PM  Go to [email or web address] - for contact info  [facility] - Office  [phone]

## 2020-04-19 NOTE — Care Management Important Message (Signed)
Important Message  Patient Details  Name: Kristie Morris MRN: [id number] Date of Birth: 08/26/1925   Medicare Important Message Given:  Yes     Tommy Medal 04/19/2020, 3:58 PM

## 2020-04-20 LAB — CBC
HCT: 31.3 % — ABNORMAL LOW (ref 36.0–46.0)
Hemoglobin: 9.6 g/dL — ABNORMAL LOW (ref 12.0–15.0)
MCH: 26.3 pg (ref 26.0–34.0)
MCHC: 30.7 g/dL (ref 30.0–36.0)
MCV: 85.8 fL (ref 80.0–100.0)
Platelets: 291 10*3/uL (ref 150–400)
RBC: 3.65 MIL/uL — ABNORMAL LOW (ref 3.87–5.11)
RDW: 14.9 % (ref 11.5–15.5)
WBC: 6.3 10*3/uL (ref 4.0–10.5)
nRBC: 0 % (ref 0.0–0.2)

## 2020-04-20 LAB — URINE CULTURE: Culture: 30000 — AB

## 2020-04-20 LAB — BASIC METABOLIC PANEL
Anion gap: 8 (ref 5–15)
BUN: 19 mg/dL (ref 8–23)
CO2: 28 mmol/L (ref 22–32)
Calcium: 12.3 mg/dL — ABNORMAL HIGH (ref 8.9–10.3)
Chloride: 104 mmol/L (ref 98–111)
Creatinine, Ser: 1.28 mg/dL — ABNORMAL HIGH (ref 0.44–1.00)
GFR calc Af Amer: 42 mL/min — ABNORMAL LOW (ref >60)
GFR calc non Af Amer: 36 mL/min — ABNORMAL LOW (ref >60)
Glucose, Bld: 102 mg/dL — ABNORMAL HIGH (ref 70–99)
Potassium: 3 mmol/L — ABNORMAL LOW (ref 3.5–5.1)
Sodium: 140 mmol/L (ref 135–145)

## 2020-04-20 LAB — CULTURE, BLOOD (ROUTINE X 2): Special Requests: ADEQUATE

## 2020-04-20 MED ORDER — POTASSIUM CHLORIDE 20 MEQ PO PACK
40.0000 meq | PACK | Freq: Once | ORAL | Status: AC
  Administered 2020-04-20: 40 meq via ORAL
  Filled 2020-04-20: qty 2

## 2020-04-20 NOTE — Plan of Care (Signed)
  Problem: SLP Dysphagia Goals Goal: Patient will utilize recommended strategies Description: Patient will utilize recommended strategies during swallow to increase swallowing safety with Flowsheets (Taken 04/20/2020 1324) Patient will utilize recommended strategies during swallow to increase swallowing safety with: max assist

## 2020-04-20 NOTE — [place] FL2 (Signed)
[place] MEDICAID FL2 LEVEL OF CARE SCREENING TOOL     IDENTIFICATION  Patient Name: Kristie Morris Birthdate: 1924-10-24 Sex: female Admission Date (Current Location): 04/17/2020  [facility] and [place] Number:  [employer] and Address:  [facility] [address], [place]      Provider Number: [id number]  Attending Physician Name and Address:  Roxan Hockey, MD  Relative Name and Phone Number:  Kandyce Rud [phone]    Current Level of Care: Hospital Recommended Level of Care: [place] Prior Approval Number: [id number]  Date Approved/Denied:   PASRR Number:    Discharge Plan: SNF    Current Diagnoses: Patient Active Problem List   Diagnosis Date Noted  . Bacteremia due to Gram-positive bacteria 04/18/2020  . Bacteremia 04/18/2020  . Acute metabolic encephalopathy 87/86/7672  . Fall 02/22/2020  . Primary osteoarthritis 01/17/2020  . Post-menopausal 10/24/2019  . Wheelchair bound 10/24/2019  . Overactive bladder 10/17/2019  . Sciatica of right side 05/29/2015  . Essential hypertension 04/27/2015  . Personal history of stroke with current residual effects 04/27/2015  . Abnormality of gait 01/08/2015  . Recurrent falls 01/08/2015  . Pre-syncope 01/08/2015  . Left-sided weakness 04/02/2014  . Diastolic dysfunction 09/47/0962  . Asthma, chronic 02/02/2014  . COPD (chronic obstructive pulmonary disease) ([place]) 02/02/2014  . Hyperlipidemia 02/02/2014    Orientation RESPIRATION BLADDER Height & Weight     Self  Normal External catheter Weight: 182 lb 15.7 oz (83 kg) Height:  5\' 4"  (162.6 cm)  BEHAVIORAL SYMPTOMS/MOOD NEUROLOGICAL BOWEL NUTRITION STATUS      Continent Diet (Normal)  AMBULATORY STATUS COMMUNICATION OF NEEDS Skin   Extensive Assist Verbally Normal                       Personal Care Assistance Level of Assistance  Bathing, Dressing, Feeding Bathing Assistance: Maximum  assistance Feeding assistance: Limited assistance Dressing Assistance: Maximum assistance     Functional Limitations Info  Sight, Hearing, Speech Sight Info: Impaired Hearing Info: Adequate Speech Info: Adequate    SPECIAL CARE FACTORS FREQUENCY  PT (By licensed PT)     PT Frequency: 5x              Contractures Contractures Info: Not present    Additional Factors Info  Code Status, Allergies Code Status Info: Full Allergies Info: Zithromax, Celebrex, Ciprofloxacin, Metronidazole, Morphine And Related, Moxifloxacin, Penicillins,Polymyxin B, Sulfa Antibiotics, Vancomycin, Vioxx           Current Medications (04/20/2020):  This is the current hospital active medication list Current Facility-Administered Medications  Medication Dose Route Frequency Provider Last Rate Last Admin  . 0.9 %  sodium chloride infusion   Intravenous Continuous Roxan Hockey, MD 75 mL/hr at 04/19/20 1601 New Bag at 04/19/20 1601  . acetaminophen (TYLENOL) tablet 650 mg  650 mg Oral Q6H PRN Emokpae, Ejiroghene E, MD   650 mg at 04/18/20 1357   Or  . acetaminophen (TYLENOL) suppository 650 mg  650 mg Rectal Q6H PRN Emokpae, Ejiroghene E, MD      . albuterol (PROVENTIL) (2.5 MG/3ML) 0.083% nebulizer solution 2.5 mg  2.5 mg Nebulization Q4H PRN Emokpae, Ejiroghene E, MD      . amLODipine (NORVASC) tablet 5 mg  5 mg Oral Daily Emokpae, Ejiroghene E, MD   5 mg at 04/20/20 0909  . aspirin EC tablet 81 mg  81 mg Oral Daily Emokpae, Ejiroghene E, MD   81 mg  at 04/20/20 0909  . ceFEPIme (MAXIPIME) 1 g in sodium chloride 0.9 % 100 mL IVPB  1 g Intravenous Q24H Emokpae, Courage, MD 200 mL/hr at 04/19/20 2246 1 g at 04/19/20 2246  . chlorhexidine (PERIDEX) 0.12 % solution 15 mL  15 mL Mouth Rinse BID Emokpae, Courage, MD   15 mL at 04/20/20 0910  . heparin injection 5,000 Units  5,000 Units Subcutaneous Q8H Emokpae, Ejiroghene E, MD   5,000 Units at 04/20/20 1327  . MEDLINE mouth rinse  15 mL Mouth Rinse  q12n4p Emokpae, Courage, MD   15 mL at 04/20/20 1543  . ondansetron (ZOFRAN) tablet 4 mg  4 mg Oral Q6H PRN Emokpae, Ejiroghene E, MD       Or  . ondansetron (ZOFRAN) injection 4 mg  4 mg Intravenous Q6H PRN Emokpae, Ejiroghene E, MD      . oxybutynin (DITROPAN) tablet 10 mg  10 mg Oral QHS Emokpae, Ejiroghene E, MD   10 mg at 04/19/20 2249  . pantoprazole (PROTONIX) EC tablet 40 mg  40 mg Oral Q1500 Emokpae, Ejiroghene E, MD   40 mg at 04/20/20 1543  . polyethylene glycol (MIRALAX / GLYCOLAX) packet 17 g  17 g Oral Daily PRN Emokpae, Ejiroghene E, MD      . rosuvastatin (CRESTOR) tablet 5 mg  5 mg Oral QPM Emokpae, Ejiroghene E, MD   5 mg at 04/19/20 1739  . sodium chloride flush (NS) 0.9 % injection 10-40 mL  10-40 mL Intracatheter Q12H Emokpae, Courage, MD   10 mL at 04/20/20 0910  . sodium chloride flush (NS) 0.9 % injection 10-40 mL  10-40 mL Intracatheter PRN Emokpae, Courage, MD      . traZODone (DESYREL) tablet 50 mg  50 mg Oral QHS Emokpae, Courage, MD   50 mg at 04/19/20 2249     Discharge Medications: Please see discharge summary for a list of discharge medications.  Relevant Imaging Results:  Relevant Lab Results:   Additional Information    Natasha Bence, LCSW

## 2020-04-20 NOTE — Progress Notes (Signed)
Patient Demographics:    Kristie Morris, is a 86 y.o. female, DOB - [birth year], VQX:[id number]  Admit date - 04/17/2020   Admitting Physician Bevin Mayall Denton Brick, MD  Outpatient Primary MD for the patient is [name], NP  LOS - 2   Chief Complaint  Patient presents with  . possible UTI        Subjective:    Kristie Morris today has no fevers, no emesis,  No chest pain,   -- remains confused, disoriented  -As per family patient is still not back to baseline from a cognitive standpoint -Having some difficulties with swallowing--- oral intake remains somewhat challenging  Assessment  & Plan :    Principal Problem:   Acute metabolic encephalopathy Active Problems:   Bacteremia due to Gram-positive bacteria   COPD (chronic obstructive pulmonary disease) ([place])   Personal history of stroke with current residual effects   Wheelchair bound   Asthma, chronic   Essential hypertension   Bacteremia  Brief Summary:- 86 y.o. female with medical history significant for diastolic CHF, COPD, asthma, hypertension, CVA with residual left hemiparesis admitted on 04/17/20 with altered mentation/disorientation in the setting of presumed UTI and GPC bacteremia altered mentation and disorientation in the setting of presumed UTI and GPC bacteremia  A/p 1)Acute metabolic encephalopathy--due to dehydration, AKI and presumed UTI and GPC bacteremia --Anticipate improvement with improvement of listed conditions -As per family patient is still not back to baseline from a cognitive standpoint  2)CONS bacteremia---??? Contaminants--- treated initially with IV Rocephin-no further antibiotics required for this particular indication -No fevers, no tachypnea, no tachycardia, no leukocytosis, lactic acid is not elevated--- patient does not meet sepsis criteria  3) Pseudomonas-UTI--- continue IV cefepime started on 04/19/2020 for  additional 3 to 4 days  4)AKI----acute kidney injury due to dehydration and concomitant meloxicam use -- creatinine on admission= 1.55  , baseline creatinine = 1.1 (10/27/19)   -Creatinine is down to 1.28 ---, renally adjust medications, avoid nephrotoxic agents / dehydration  / hypotension  -.hydrate  IV and p.o.  5) poor venous access--limiting ability to give IV fluids and IV antibiotics,-had  ultrasound assisted midline placement  6) ambulatory dysfunction--- at baseline patient was able to stand and pivot but she gets around with a wheelchair she does not walk -PT eval appreciated, SNF recommended however patient may be able to go home with home health PT and OT  7) prior history of stroke more than 5 years ago with residual left-sided hemiparesis--- please see #6 above -Continue aspirin and crestor for secondary stroke prophylaxis - 8)Depression--- stable continue Cymbalta, and trazodone  9) dysphagia--speech and swallow evaluation appreciated, recommends dysphagia 3 (Mech soft);Nectar-thick liquid   Disposition/Need for in-Hospital Stay- patient unable to be discharged at this time due to -acute metabolic encephalopathy secondary to Pseudomonas UTI, as well as acute kidney injury and dehydration requiring IV fluids and IV antibiotics  Status is: Inpatient  Remains inpatient appropriate because:acute metabolic encephalopathy secondary to Pseudomonas UTI, as well as acute kidney injury and dehydration requiring IV fluids and IV antibiotics  Disposition: The patient is from: Home              Anticipated d/c is to: Home  Anticipated d/c date is: 2 days              Patient currently is not medically stable to d/c. Barriers: Not Clinically Stable- -acute metabolic encephalopathy secondary to Pseudomonas UTI, as well as acute kidney injury and dehydration requiring IV fluids and IV antibiotics  Code Status : Full  Family Communication: discussed with grandson and  granddaughter  Consults  :  na  DVT Prophylaxis  :   - Heparin - SCDs   Lab Results  Component Value Date   PLT 291 04/20/2020    Inpatient Medications  Scheduled Meds: . amLODipine  5 mg Oral Daily  . aspirin EC  81 mg Oral Daily  . chlorhexidine  15 mL Mouth Rinse BID  . heparin  5,000 Units Subcutaneous Q8H  . mouth rinse  15 mL Mouth Rinse q12n4p  . oxybutynin  10 mg Oral QHS  . pantoprazole  40 mg Oral Q1500  . potassium chloride  40 mEq Oral Once  . rosuvastatin  5 mg Oral QPM  . sodium chloride flush  10-40 mL Intracatheter Q12H  . traZODone  50 mg Oral QHS   Continuous Infusions: . sodium chloride 75 mL/hr at 04/19/20 1601  . ceFEPime (MAXIPIME) IV 1 g (04/19/20 2246)   PRN Meds:.acetaminophen **OR** acetaminophen, albuterol, ondansetron **OR** ondansetron (ZOFRAN) IV, polyethylene glycol, sodium chloride flush    Anti-infectives (From admission, onward)   Start     Dose/Rate Route Frequency Ordered Stop   04/19/20 2000  ceFEPIme (MAXIPIME) 1 g in sodium chloride 0.9 % 100 mL IVPB     Discontinue     1 g 200 mL/hr over 30 Minutes Intravenous Every 24 hours 04/19/20 1949     04/18/20 1645  cefTRIAXone (ROCEPHIN) 2 g in sodium chloride 0.9 % 100 mL IVPB  Status:  Discontinued        2 g 200 mL/hr over 30 Minutes Intravenous Every 24 hours 04/18/20 1625 04/19/20 1928   04/17/20 1530  cefTRIAXone (ROCEPHIN) 1 g in sodium chloride 0.9 % 100 mL IVPB  Status:  Discontinued        1 g 200 mL/hr over 30 Minutes Intravenous Every 24 hours 04/17/20 1517 04/17/20 2212        Objective:   Vitals:   04/20/20 0514 04/20/20 0700 04/20/20 1100 04/20/20 1300  BP: (!) 156/62 117/83 138/69 (!) 131/92  Pulse: (!) 113 (!) 103 (!) 108 (!) 103  Resp: 18 18 18 18   Temp: 99 F (37.2 C)  98.3 F (36.8 C) 98.6 F (37 C)  TempSrc: Oral   Oral  SpO2: 94% 96% 96% 96%  Weight:      Height:        Wt Readings from Last 3 Encounters:  04/17/20 83 kg  02/21/20 83.5 kg   02/21/20 83.5 kg     Intake/Output Summary (Last 24 hours) at 04/20/2020 1845 Last data filed at 04/20/2020 1807 Gross per 24 hour  Intake 1113.75 ml  Output 2350 ml  Net -1236.25 ml     Physical Exam  Gen:- Awake , confused and disoriented HEENT:- .AT, No sclera icterus Neck-Supple Neck,No JVD,.  Lungs-  CTAB , fair symmetrical air movement CV- S1, S2 normal, regular  Abd-  +ve B.Sounds, Abd Soft, No tenderness, no CVA area tenderness    Extremity/Skin:- No  edema, pedal pulses present  Psych-affect is disoriented and confused  neuro-residual left-sided hemiparesis, no new focal deficits, no tremors   Data Review:  Micro Results Recent Results (from the past 240 hour(s))  Urine culture     Status: Abnormal   Collection Time: 04/17/20  2:34 PM   Specimen: In/Out Cath Urine  Result Value Ref Range Status   Specimen Description   Final    IN/OUT CATH URINE Performed at [facility], [address], [place], [place] [address]    Special Requests   Final    NONE Performed at [facility], [address] [place], [place] [address]    Culture 30,000 COLONIES/mL PSEUDOMONAS AERUGINOSA (A)  Final   Report Status 04/20/2020 FINAL  Final   Organism ID, Bacteria PSEUDOMONAS AERUGINOSA (A)  Final      Susceptibility   Pseudomonas aeruginosa - MIC*    CEFTAZIDIME 2 SENSITIVE Sensitive     CIPROFLOXACIN <=0.25 SENSITIVE Sensitive     GENTAMICIN <=1 SENSITIVE Sensitive     IMIPENEM 1 SENSITIVE Sensitive     PIP/TAZO 8 SENSITIVE Sensitive     CEFEPIME 2 SENSITIVE Sensitive     * 30,000 COLONIES/mL PSEUDOMONAS AERUGINOSA  SARS Coronavirus 2 by RT PCR (hospital order, performed in [facility] hospital lab) Nasopharyngeal Nasopharyngeal Swab     Status: None   Collection Time: 04/17/20  3:43 PM   Specimen: Nasopharyngeal Swab  Result Value Ref Range Status   SARS Coronavirus 2 NEGATIVE NEGATIVE Final    Comment: (NOTE) SARS-CoV-2 target nucleic acids are NOT DETECTED.  The  SARS-CoV-2 RNA is generally detectable in upper and lower respiratory specimens during the acute phase of infection. The lowest concentration of SARS-CoV-2 viral copies this assay can detect is 250 copies / mL. A negative result does not preclude SARS-CoV-2 infection and should not be used as the sole basis for treatment or other patient management decisions.  A negative result may occur with improper specimen collection / handling, submission of specimen other than nasopharyngeal swab, presence of viral mutation(s) within the areas targeted by this assay, and inadequate number of viral copies (<250 copies / mL). A negative result must be combined with clinical observations, patient history, and epidemiological information.  Fact Sheet for Patients:   StrictlyIdeas.no  Fact Sheet for Healthcare Providers: BankingDealers.co.za  This test is not yet approved or  cleared by the [place] FDA and has been authorized for detection and/or diagnosis of SARS-CoV-2 by FDA under an Emergency Use Authorization (EUA).  This EUA will remain in effect (meaning this test can be used) for the duration of the COVID-19 declaration under Section 564(b)(1) of the Act, 21 U.S.C. section 360bbb-3(b)(1), unless the authorization is terminated or revoked sooner.  Performed at [facility], [address], [place], [place] [address]   Blood Culture (routine x 2)     Status: None (Preliminary result)   Collection Time: 04/17/20  4:19 PM   Specimen: Right Antecubital; Blood  Result Value Ref Range Status   Specimen Description RIGHT ANTECUBITAL  Final   Special Requests   Final    BOTTLES DRAWN AEROBIC AND ANAEROBIC Blood Culture adequate volume   Culture   Final    NO GROWTH 3 DAYS Performed at [facility], [address], [place], [place] [address]    Report Status PENDING  Incomplete  Blood Culture (routine x 2)     Status: Abnormal   Collection Time:  04/17/20  4:24 PM   Specimen: Left Antecubital; Blood  Result Value Ref Range Status   Specimen Description   Final    LEFT ANTECUBITAL Performed at [facility], [address], [place], [place] [address]    Special Requests   Final    BOTTLES DRAWN AEROBIC AND ANAEROBIC Blood Culture adequate volume Performed at [facility], [address] [place], [place] [address]    Culture  Setup Time   Final    BOTH BOTTLES GRAM POSITIVE COCCI Gram Stain Report Called to,Read Back By and Verified With: Lowry Ram 04/18/20 @1619  BY JONEST  CRITICAL RESULT CALLED TO, READ BACK BY AND VERIFIED WITH: D,JONES RN @2257  04/18/20 EB    Culture (A)  Final    STAPHYLOCOCCUS EPIDERMIDIS THE SIGNIFICANCE OF ISOLATING THIS ORGANISM FROM A SINGLE SET OF BLOOD CULTURES WHEN MULTIPLE SETS ARE DRAWN IS UNCERTAIN. PLEASE NOTIFY THE MICROBIOLOGY DEPARTMENT WITHIN ONE WEEK IF SPECIATION AND SENSITIVITIES ARE REQUIRED. Performed at [facility], [place] [address] [place], [place] [address]    Report Status 04/20/2020 FINAL  Final  Blood Culture ID Panel (Reflexed)     Status: Abnormal   Collection Time: 04/17/20  4:24 PM  Result Value Ref Range Status   Enterococcus species NOT DETECTED NOT DETECTED Final   Listeria monocytogenes NOT DETECTED NOT DETECTED Final   Staphylococcus species DETECTED (A) NOT DETECTED Final    Comment: Methicillin (oxacillin) resistant coagulase negative staphylococcus. Possible blood culture contaminant (unless isolated from more than one blood culture draw or clinical case suggests pathogenicity). No antibiotic treatment is indicated for blood  culture contaminants. CRITICAL RESULT CALLED TO, READ BACK BY AND VERIFIED WITH: D,JONES RN @2257  04/18/20 EB    Staphylococcus aureus (BCID) NOT DETECTED NOT DETECTED Final   Methicillin resistance DETECTED (A) NOT DETECTED Final    Comment: CRITICAL RESULT CALLED TO, READ BACK BY AND VERIFIED WITH: D,JONES RN @2257  04/18/20 EB     Streptococcus species NOT DETECTED NOT DETECTED Final   Streptococcus agalactiae NOT DETECTED NOT DETECTED Final   Streptococcus pneumoniae NOT DETECTED NOT DETECTED Final   Streptococcus pyogenes NOT DETECTED NOT DETECTED Final   Acinetobacter baumannii NOT DETECTED NOT DETECTED Final   Enterobacteriaceae species NOT DETECTED NOT DETECTED Final   Enterobacter cloacae complex NOT DETECTED NOT DETECTED Final   Escherichia coli NOT DETECTED NOT DETECTED Final   Klebsiella oxytoca NOT DETECTED NOT DETECTED Final   Klebsiella pneumoniae NOT DETECTED NOT DETECTED Final   Proteus species NOT DETECTED NOT DETECTED Final   Serratia marcescens NOT DETECTED NOT DETECTED Final   Haemophilus influenzae NOT DETECTED NOT DETECTED Final   Neisseria meningitidis NOT DETECTED NOT DETECTED Final   Pseudomonas aeruginosa NOT DETECTED NOT DETECTED Final   Candida albicans NOT DETECTED NOT DETECTED Final   Candida glabrata NOT DETECTED NOT DETECTED Final   Candida krusei NOT DETECTED NOT DETECTED Final   Candida parapsilosis NOT DETECTED NOT DETECTED Final   Candida tropicalis NOT DETECTED NOT DETECTED Final    Comment: Performed at [facility], [place]. [address] [place], [place] [address]    Radiology Reports DG Chest 1 View  Result Date: 04/17/2020 CLINICAL DATA:  Chest pain, altered mental status EXAM: CHEST  1 VIEW COMPARISON:  02/21/2020 FINDINGS: The generalized lung apices, slightly limiting evaluation. The lungs are symmetrically expanded and are clear. No definite pneumothorax. No pleural effusion. The cardiomediastinal silhouette is unremarkable. Pulmonary vascularity is normal. Surgical clips are noted within the axilla bilaterally. No acute bone abnormality IMPRESSION: No active disease. Electronically Signed   By: Fidela Salisbury MD   On: 04/17/2020 15:11   CT Head Wo Contrast  Result Date:  04/17/2020 CLINICAL DATA:  Delirium EXAM: CT HEAD WITHOUT CONTRAST TECHNIQUE: Contiguous axial images  were obtained from the base of the skull through the vertex without intravenous contrast. COMPARISON:  Feb 21, 2020 FINDINGS: Brain: No evidence of acute territorial infarction, hemorrhage, hydrocephalus,extra-axial collection or mass lesion/mass effect. There is dilatation the ventricles and sulci consistent with age-related atrophy. Low-attenuation changes in the deep white matter consistent with small vessel ischemia. Prior lacunar infarct seen within the right basal ganglia. Vascular: No hyperdense vessel or unexpected calcification. Skull: The skull is intact. No fracture or focal lesion identified. Sinuses/Orbits: The visualized paranasal sinuses and mastoid air cells are clear. The orbits and globes intact. Other: None IMPRESSION: No acute intracranial abnormality. Findings consistent with age related atrophy and chronic small vessel ischemia Electronically Signed   By: Prudencio Pair M.D.   On: 04/17/2020 17:00   [place] EKG SITE RITE  Result Date: 04/18/2020 If Site Rite image not attached, placement could not be confirmed due to current cardiac rhythm.    CBC Recent Labs  Lab 04/17/20 1619 04/20/20 0821  WBC 9.2 6.3  HGB 10.5* 9.6*  HCT 34.5* 31.3*  PLT 316 291  MCV 86.5 85.8  MCH 26.3 26.3  MCHC 30.4 30.7  RDW 15.1 14.9  LYMPHSABS 0.9  --   MONOABS 0.7  --   EOSABS 0.7*  --   BASOSABS 0.1  --     Chemistries  Recent Labs  Lab 04/17/20 1619 04/20/20 0821  NA 134* 140  K 3.8 3.0*  CL 96* 104  CO2 29 28  GLUCOSE 112* 102*  BUN 34* 19  CREATININE 1.55* 1.28*  CALCIUM 12.7* 12.3*  MG 1.9  --   AST 20  --   ALT 13  --   ALKPHOS 100  --   BILITOT 0.5  --    ------------------------------------------------------------------------------------------------------------------ No results for input(s): CHOL, HDL, LDLCALC, TRIG, CHOLHDL, LDLDIRECT in the last 72 hours.  No results found for:  HGBA1C ------------------------------------------------------------------------------------------------------------------ No results for input(s): TSH, T4TOTAL, T3FREE, THYROIDAB in the last 72 hours.  Invalid input(s): FREET3 ------------------------------------------------------------------------------------------------------------------ No results for input(s): VITAMINB12, FOLATE, FERRITIN, TIBC, IRON, RETICCTPCT in the last 72 hours.  Coagulation profile Recent Labs  Lab 04/17/20 1619  INR 1.0    No results for input(s): DDIMER in the last 72 hours.  Cardiac Enzymes No results for input(s): CKMB, TROPONINI, MYOGLOBIN in the last 168 hours.  Invalid input(s): CK ------------------------------------------------------------------------------------------------------------------    Component Value Date/Time   BNP 21.0 02/21/2020 1614   Soha Thorup M.D on 04/20/2020 at 6:45 PM  Go to [email or web address] - for contact info  [facility] - Office  [phone]

## 2020-04-20 NOTE — Evaluation (Signed)
Clinical/Bedside Swallow Evaluation Patient Details  Name: Kristie Morris MRN: [id number] Date of Birth: 1925/08/17  Today's Date: 04/20/2020 Time: SLP Start Time (ACUTE ONLY): 77 SLP Stop Time (ACUTE ONLY): 1259 SLP Time Calculation (min) (ACUTE ONLY): 29 min  Past Medical History:  Past Medical History:  Diagnosis Date  . Anemia 02/02/2014  . Anxiety   . Arthritis    knee  . Asthma   . CAD (coronary artery disease) cardiologist-  dr Bronson Ing  ([facility] in [place])   2003  Cath with nonobstructive CAD  . Chest pain 08/14/2017  . Chronic headache   . Chronic lower back pain   . COPD (chronic obstructive pulmonary disease) ([place])   . Diastolic CHF, chronic ([place])   . Duodenal diverticulum   . Edema of left lower extremity   . Fibromyalgia   . Full dentures   . Gait disorder    uses walker  . GERD (gastroesophageal reflux disease)   . Headache disorder 01/17/2015  . Heart murmur   . History of breast cancer   . History of Clostridium difficile   . History of colitis    2001  . History of CVA with residual deficit    06/ 2015  residual left hemiparesis  . History of esophageal dilatation   . History of falling    last fall 06-25-2015  . History of hiatal hernia   . History of small bowel obstruction    04-25-2015  resolved without surgical intervention  . Lesion of bladder   . Low vision, one eye    right eye  . Macular degeneration of both eyes    right eye now low vision/  left eye getting injections currently  . Malignant hypertension   . OAB (overactive bladder)   . Protein-calorie malnutrition, severe ([place]) 01/10/2015  . SBO (small bowel obstruction) ([place]) 04/22/2015  . Schatzki's ring   . Sciatica of right side 05/29/2015  . Syncope 08/14/2017  . Urge and stress incontinence   . Varicose veins   . Venous insufficiency, peripheral   . Vertigo    Past Surgical History:  Past Surgical History:  Procedure Laterality Date  . [place]   from colon  . ABDOMINAL HYSTERECTOMY  1954   w/ bilateral salpingoophorectomy  . ANTERIOR CERVICAL DECOMP/DISCECTOMY FUSION  1971   "used bone off of my right hip"  . BREAST LUMPECTOMY Left 2006  . CARDIAC CATHETERIZATION  11-29-2001  dr Tressia Miners turner   Non-obstructive CAD/  20% pLAD,  30% pD1, 50% mRCA/  normal LVF  . CATARACT EXTRACTION W/ INTRAOCULAR LENS  IMPLANT, BILATERAL  right 2008//  left 2015  . CYSTOSCOPY WITH HYDRODISTENSION AND BIOPSY N/A 07/23/2015   Procedure: CYSTOSCOPY;HYDRODISTENSION;  Surgeon: Irine Seal, MD;  Location: [facility];  Service: Urology;  Laterality: N/A;  . DILATION AND CURETTAGE OF UTERUS  1953    miscarriage  . [place]  . KNEE ARTHROSCOPY Right 2005  . MASTECTOMY, PARTIAL Right 1999  . OVARIAN CYST REMOVAL  1946  . PATELLA FRACTURE SURGERY Left 2008  . TRANSTHORACIC ECHOCARDIOGRAM  01-09-2015   mild LVH,  ef 16-07%, grade I diastolic dysfunction/  mild MR/  trivial pericardial effusion was identified   HPI:  Kristie Morris is a 86 y.o. female with medical history significant for diastolic CHF, COPD, asthma, hypertension, CVA with residual left hemiparesis admitted on 04/17/20 with altered mentation/disorientation in the setting of presumed UTI and GPC  bacteremia altered mentation and disorientation in the setting of presumed UTI and GPC bacteremia. Pt reportedly had a "choking episode" with coffee yesterday, 04/19/2020, BSE ordered. Granddaughter reports occasional "choking" at home   Assessment / Plan / Recommendation Clinical Impression  Clinical swallowing evaluation completed while Pt was sitting upright in bed with granddaughter present for evaluation. Pt presents with s/sx of oropharyngeal dysphagia. Upon SLP entering the room Pt was being given water via straw while reclined at ~45degrees in the bed; Pt demonstrated a delayed congested sounding cough. SLP provided education that Pt should ALWAYS be seated at 90  degrees for PO. Note significant lean to the Left which is reportedly baseline since old CVA; Pt consumed ice chips, thin via cup and straw and demonstrated wet vocal quality that was sometimes cleared by a cued dry repeat swallow (Pt required max verbal cues to produce this swallow); she also demonstrated a delayed congested sounding cough X2 after thin via straw. With all trials, thin, regular and milkshake trials she demonstrated poor awareness of bolus, talking with PO and anterior spillage on the Left side. Decreased wet vocal quality and no coughing was noted with NTL. Recommend downgrade Pt's diet to D3/soft and NECTAR thick liquids; recommend meds to be given crushed in puree or pudding. Question oral awareness of bolus and anterior spill to be related to altered mentation. ST will f/u for a diet check on Monday to determine possible need for instrumental testing. Thank you, SLP Visit Diagnosis: Dysphagia, unspecified (R13.10)    Aspiration Risk  Mild aspiration risk;Moderate aspiration risk    Diet Recommendation Dysphagia 3 (Mech soft);Nectar-thick liquid   Liquid Administration via: Cup;No straw Medication Administration: Crushed with puree Supervision: Full supervision/cueing for compensatory strategies;Staff to assist with self feeding Compensations: Slow rate;Small sips/bites;Monitor for anterior loss;Multiple dry swallows after each bite/sip;Follow solids with liquid;Clear throat intermittently Postural Changes: Seated upright at 90 degrees;Remain upright for at least 30 minutes after po intake    Other  Recommendations Oral Care Recommendations: Oral care prior to ice chip/H20   Follow up Recommendations 24 hour supervision/assistance;Skilled Nursing facility      Frequency and Duration min 1 x/week  1 week       Prognosis Prognosis for Safe Diet Advancement: Fair Barriers to Reach Goals: Cognitive deficits      Swallow Study   General Date of Onset: 04/20/20 HPI: Kristie Morris is a 86 y.o. female with medical history significant for diastolic CHF, COPD, asthma, hypertension, CVA with residual left hemiparesis admitted on 04/17/20 with altered mentation/disorientation in the setting of presumed UTI and GPC bacteremia altered mentation and disorientation in the setting of presumed UTI and GPC bacteremia. Pt reportedly had a "choking episode" with coffee yesterday, 04/19/2020, BSE ordered. Granddaughter reports occasional "choking" at home Type of Study: Bedside Swallow Evaluation Previous Swallow Assessment: none in chart Diet Prior to this Study: Regular;Thin liquids Temperature Spikes Noted: No Respiratory Status: Nasal cannula History of Recent Intubation: No Behavior/Cognition: Alert;Cooperative;Confused Oral Cavity Assessment: Within Functional Limits Oral Care Completed by SLP: Yes Oral Cavity - Dentition: Dentures, top;Dentures, bottom Vision: Functional for self-feeding Self-Feeding Abilities: Able to feed self Patient Positioning: Upright in bed Baseline Vocal Quality: Normal Volitional Cough: Strong Volitional Swallow: Able to elicit    Oral/Motor/Sensory Function Overall Oral Motor/Sensory Function: Mild impairment Facial ROM: Reduced left Facial Symmetry: Abnormal symmetry left   Ice Chips Ice chips: Impaired Presentation: Spoon Pharyngeal Phase Impairments: Wet Vocal Quality   Thin Liquid Thin Liquid: Impaired  Presentation: Cup Oral Phase Impairments: Poor awareness of bolus;Reduced lingual movement/coordination;Reduced labial seal Oral Phase Functional Implications: Left anterior spillage Pharyngeal  Phase Impairments: Multiple swallows;Wet Vocal Quality;Throat Clearing - Delayed;Cough - Delayed    Nectar Thick Nectar Thick Liquid: Within functional limits   Honey Thick Honey Thick Liquid: Not tested   Puree Puree: Not tested   Solid     Solid: Impaired Presentation: Self Fed Oral Phase Impairments: Poor awareness of bolus;Reduced  lingual movement/coordination;Reduced labial seal Oral Phase Functional Implications: Left anterior spillage;Prolonged oral transit;Oral residue Pharyngeal Phase Impairments: Multiple swallows;Throat Clearing - Delayed     Neilson Oehlert H. Roddie Mc, CCC-SLP Speech Language Pathologist  Wende Bushy 04/20/2020,1:22 PM

## 2020-04-21 LAB — BASIC METABOLIC PANEL
Anion gap: 8 (ref 5–15)
BUN: 18 mg/dL (ref 8–23)
CO2: 28 mmol/L (ref 22–32)
Calcium: 12.3 mg/dL — ABNORMAL HIGH (ref 8.9–10.3)
Chloride: 104 mmol/L (ref 98–111)
Creatinine, Ser: 1.21 mg/dL — ABNORMAL HIGH (ref 0.44–1.00)
GFR calc Af Amer: 45 mL/min — ABNORMAL LOW (ref >60)
GFR calc non Af Amer: 39 mL/min — ABNORMAL LOW (ref >60)
Glucose, Bld: 100 mg/dL — ABNORMAL HIGH (ref 70–99)
Potassium: 2.6 mmol/L — CL (ref 3.5–5.1)
Sodium: 140 mmol/L (ref 135–145)

## 2020-04-21 LAB — MAGNESIUM: Magnesium: 1.5 mg/dL — ABNORMAL LOW (ref 1.7–2.4)

## 2020-04-21 MED ORDER — POTASSIUM CHLORIDE CRYS ER 20 MEQ PO TBCR
40.0000 meq | EXTENDED_RELEASE_TABLET | Freq: Once | ORAL | Status: AC
  Administered 2020-04-21: 40 meq via ORAL
  Filled 2020-04-21: qty 2

## 2020-04-21 MED ORDER — POTASSIUM CHLORIDE CRYS ER 20 MEQ PO TBCR
40.0000 meq | EXTENDED_RELEASE_TABLET | ORAL | Status: AC
  Administered 2020-04-21 (×2): 40 meq via ORAL
  Filled 2020-04-21 (×2): qty 2

## 2020-04-21 MED ORDER — MAGNESIUM SULFATE 4 GM/100ML IV SOLN
4.0000 g | Freq: Once | INTRAVENOUS | Status: AC
  Administered 2020-04-21: 4 g via INTRAVENOUS
  Filled 2020-04-21: qty 100

## 2020-04-21 MED ORDER — LABETALOL HCL 5 MG/ML IV SOLN: 10.0000 mg | INTRAVENOUS | Status: DC | PRN

## 2020-04-21 NOTE — Progress Notes (Signed)
Patient Demographics:    Kristie Morris, is a 86 y.o. female, DOB - [birth year], QJJ:[id number]  Admit date - 04/17/2020   Admitting Physician Delayla Hoffmaster Denton Brick, MD  Outpatient Primary MD for the patient is [name], NP  LOS - 3   Chief Complaint  Patient presents with  . possible UTI        Subjective:    Kristie Morris today has no fevers, no emesis,  No chest pain,   - -RN at bedside trying to administer medications Nursing Tech at bedside trying to feed patient---  --patient states that she thinks she is pregnant, remains confused, disoriented and oral intake remains challenging  Assessment  & Plan :    Principal Problem:   Acute metabolic encephalopathy Active Problems:   Bacteremia due to Gram-positive bacteria   COPD (chronic obstructive pulmonary disease) ([place])   Personal history of stroke with current residual effects   Wheelchair bound   Asthma, chronic   Essential hypertension   Bacteremia  Brief Summary:- 86 y.o. female with medical history significant for diastolic CHF, COPD, asthma, hypertension, CVA with residual left hemiparesis admitted on 04/17/20 with altered mentation/disorientation in the setting of presumed UTI and GPC bacteremia altered mentation and disorientation in the setting of presumed UTI and GPC bacteremia  A/p 1)Acute metabolic encephalopathy--due to dehydration, AKI and presumed UTI and GPC bacteremia --Anticipate improvement with improvement of listed conditions -As per family patient is still not back to baseline from a cognitive standpoint  2)CONS bacteremia---??? Contaminants--- treated initially with IV Rocephin-no further antibiotics required for this particular indication -No fevers, no tachypnea, no tachycardia, no leukocytosis, lactic acid is not elevated--- patient does not meet sepsis criteria  3) Pseudomonas-UTI--- continue IV cefepime started on  04/19/2020 for additional 3 to 4 days  4)AKI----acute kidney injury due to dehydration and concomitant meloxicam use -- creatinine on admission= 1.55  , baseline creatinine = 1.1 (10/27/19)   -Creatinine is down to 1.21 ---, renally adjust medications, avoid nephrotoxic agents / dehydration  / hypotension  -.hydrate  IV and p.o.  5) poor venous access--limiting ability to give IV fluids and IV antibiotics,-had  ultrasound assisted midline placement to the left arm  6) ambulatory dysfunction--- at baseline patient was able to stand and pivot but she gets around with a wheelchair she does not walk -PT eval appreciated, SNF recommended however patient may be able to go home with home health PT and OT  7) prior history of stroke more than 5 years ago with residual left-sided hemiparesis--- please see #6 above -Continue aspirin and crestor for secondary stroke prophylaxis - 8)Depression--- stable continue Cymbalta, and trazodone  9) dysphagia--speech and swallow evaluation appreciated, recommends dysphagia 3 (Mech soft);Nectar-thick liquid   10) hypokalemia/Hypomagnesiemia--potassium is 2.6 replace and recheck -Magnesium is 1.5--replace and recheck  Disposition/Need for in-Hospital Stay- patient unable to be discharged at this time due to -acute metabolic encephalopathy secondary to Pseudomonas UTI, as well as acute kidney injury and dehydration requiring IV fluids and IV antibiotics  Status is: Inpatient  Remains inpatient appropriate because:acute metabolic encephalopathy secondary to Pseudomonas UTI, as well as acute kidney injury and dehydration requiring IV fluids and IV antibiotics  Disposition: The patient is from: Home  Anticipated d/c is to: Home              Anticipated d/c date is: 2 days              Patient currently is not medically stable to d/c. Barriers: Not Clinically Stable- -acute metabolic encephalopathy secondary to Pseudomonas UTI, as well as acute kidney  injury and dehydration requiring IV fluids and IV antibiotics  Code Status : Full  Family Communication: discussed with grandson and granddaughter  Consults  :  na  DVT Prophylaxis  :   - Heparin - SCDs   Lab Results  Component Value Date   PLT 291 04/20/2020    Inpatient Medications  Scheduled Meds: . amLODipine  5 mg Oral Daily  . aspirin EC  81 mg Oral Daily  . chlorhexidine  15 mL Mouth Rinse BID  . heparin  5,000 Units Subcutaneous Q8H  . mouth rinse  15 mL Mouth Rinse q12n4p  . oxybutynin  10 mg Oral QHS  . pantoprazole  40 mg Oral Q1500  . potassium chloride  40 mEq Oral Once  . rosuvastatin  5 mg Oral QPM  . sodium chloride flush  10-40 mL Intracatheter Q12H  . traZODone  50 mg Oral QHS   Continuous Infusions: . sodium chloride 75 mL/hr at 04/21/20 1558  . ceFEPime (MAXIPIME) IV Stopped (04/20/20 2027)   PRN Meds:.acetaminophen **OR** acetaminophen, albuterol, labetalol, ondansetron **OR** ondansetron (ZOFRAN) IV, polyethylene glycol, sodium chloride flush    Anti-infectives (From admission, onward)   Start     Dose/Rate Route Frequency Ordered Stop   04/19/20 2000  ceFEPIme (MAXIPIME) 1 g in sodium chloride 0.9 % 100 mL IVPB     Discontinue     1 g 200 mL/hr over 30 Minutes Intravenous Every 24 hours 04/19/20 1949     04/18/20 1645  cefTRIAXone (ROCEPHIN) 2 g in sodium chloride 0.9 % 100 mL IVPB  Status:  Discontinued        2 g 200 mL/hr over 30 Minutes Intravenous Every 24 hours 04/18/20 1625 04/19/20 1928   04/17/20 1530  cefTRIAXone (ROCEPHIN) 1 g in sodium chloride 0.9 % 100 mL IVPB  Status:  Discontinued        1 g 200 mL/hr over 30 Minutes Intravenous Every 24 hours 04/17/20 1517 04/17/20 2212        Objective:   Vitals:   04/21/20 0345 04/21/20 0536 04/21/20 0916 04/21/20 1325  BP:  (!) 175/77 (!) 129/100 (!) 139/97  Pulse:  (!) 105  (!) 110  Resp:  16  16  Temp:  98.7 F (37.1 C)  98.5 F (36.9 C)  TempSrc:  Oral  Oral  SpO2:  94% 96%  95%  Weight: 76.7 kg     Height:        Wt Readings from Last 3 Encounters:  04/21/20 76.7 kg  02/21/20 83.5 kg  02/21/20 83.5 kg     Intake/Output Summary (Last 24 hours) at 04/21/2020 1643 Last data filed at 04/21/2020 1558 Gross per 24 hour  Intake 1351.25 ml  Output 2350 ml  Net -998.75 ml     Physical Exam  Gen:- Awake , confused and disoriented HEENT:- [place].AT, No sclera icterus Neck-Supple Neck,No JVD,.  Lungs-  CTAB , fair symmetrical air movement CV- S1, S2 normal, regular  Abd-  +ve B.Sounds, Abd Soft, No tenderness, no CVA area tenderness    Extremity/Skin:- No  edema, pedal pulses present  Psych-affect is disoriented and  confused  neuro-residual left-sided hemiparesis, no new focal deficits, no tremors   Data Review:   Micro Results Recent Results (from the past 240 hour(s))  Urine culture     Status: Abnormal   Collection Time: 04/17/20  2:34 PM   Specimen: In/Out Cath Urine  Result Value Ref Range Status   Specimen Description   Final    IN/OUT CATH URINE Performed at [facility], [address], [place], [place] [address]    Special Requests   Final    NONE Performed at [facility], [address] [place], [place] [address]    Culture 30,000 COLONIES/mL PSEUDOMONAS AERUGINOSA (A)  Final   Report Status 04/20/2020 FINAL  Final   Organism ID, Bacteria PSEUDOMONAS AERUGINOSA (A)  Final      Susceptibility   Pseudomonas aeruginosa - MIC*    CEFTAZIDIME 2 SENSITIVE Sensitive     CIPROFLOXACIN <=0.25 SENSITIVE Sensitive     GENTAMICIN <=1 SENSITIVE Sensitive     IMIPENEM 1 SENSITIVE Sensitive     PIP/TAZO 8 SENSITIVE Sensitive     CEFEPIME 2 SENSITIVE Sensitive     * 30,000 COLONIES/mL PSEUDOMONAS AERUGINOSA  SARS Coronavirus 2 by RT PCR (hospital order, performed in [place] hospital lab) Nasopharyngeal Nasopharyngeal Swab     Status: None   Collection Time: 04/17/20  3:43 PM   Specimen: Nasopharyngeal Swab  Result Value Ref Range Status    SARS Coronavirus 2 NEGATIVE NEGATIVE Final    Comment: (NOTE) SARS-CoV-2 target nucleic acids are NOT DETECTED.  The SARS-CoV-2 RNA is generally detectable in upper and lower respiratory specimens during the acute phase of infection. The lowest concentration of SARS-CoV-2 viral copies this assay can detect is 250 copies / mL. A negative result does not preclude SARS-CoV-2 infection and should not be used as the sole basis for treatment or other patient management decisions.  A negative result may occur with improper specimen collection / handling, submission of specimen other than nasopharyngeal swab, presence of viral mutation(s) within the areas targeted by this assay, and inadequate number of viral copies (<250 copies / mL). A negative result must be combined with clinical observations, patient history, and epidemiological information.  Fact Sheet for Patients:   StrictlyIdeas.no  Fact Sheet for Healthcare Providers: BankingDealers.co.za  This test is not yet approved or  cleared by the [place] FDA and has been authorized for detection and/or diagnosis of SARS-CoV-2 by FDA under an Emergency Use Authorization (EUA).  This EUA will remain in effect (meaning this test can be used) for the duration of the COVID-19 declaration under Section 564(b)(1) of the Act, 21 U.S.C. section 360bbb-3(b)(1), unless the authorization is terminated or revoked sooner.  Performed at [facility], [address] [place], [place] [address]   Blood Culture (routine x 2)     Status: None (Preliminary result)   Collection Time: 04/17/20  4:19 PM   Specimen: Right Antecubital; Blood  Result Value Ref Range Status   Specimen Description RIGHT ANTECUBITAL  Final   Special Requests   Final    BOTTLES DRAWN AEROBIC AND ANAEROBIC Blood Culture adequate volume   Culture   Final    NO GROWTH 4 DAYS Performed at [facility], [address]  [place], [place] [address]    Report Status PENDING  Incomplete  Blood Culture (routine x 2)     Status: Abnormal   Collection Time: 04/17/20  4:24 PM   Specimen: Left Antecubital; Blood  Result Value Ref Range  Status   Specimen Description   Final    LEFT ANTECUBITAL Performed at [facility], [address] [place], [place] [address]    Special Requests   Final    BOTTLES DRAWN AEROBIC AND ANAEROBIC Blood Culture adequate volume Performed at [facility], [address] [place], [place] [address]    Culture  Setup Time   Final    BOTH BOTTLES GRAM POSITIVE COCCI Gram Stain Report Called to,Read Back By and Verified With: Lowry Ram 04/18/20 @1619  BY JONEST  CRITICAL RESULT CALLED TO, READ BACK BY AND VERIFIED WITH: D,JONES RN @2257  04/18/20 EB    Culture (A)  Final    STAPHYLOCOCCUS EPIDERMIDIS THE SIGNIFICANCE OF ISOLATING THIS ORGANISM FROM A SINGLE SET OF BLOOD CULTURES WHEN MULTIPLE SETS ARE DRAWN IS UNCERTAIN. PLEASE NOTIFY THE MICROBIOLOGY DEPARTMENT WITHIN ONE WEEK IF SPECIATION AND SENSITIVITIES ARE REQUIRED. Performed at [facility], [place] [address], [place], [place] [address]    Report Status 04/20/2020 FINAL  Final  Blood Culture ID Panel (Reflexed)     Status: Abnormal   Collection Time: 04/17/20  4:24 PM  Result Value Ref Range Status   Enterococcus species NOT DETECTED NOT DETECTED Final   Listeria monocytogenes NOT DETECTED NOT DETECTED Final   Staphylococcus species DETECTED (A) NOT DETECTED Final    Comment: Methicillin (oxacillin) resistant coagulase negative staphylococcus. Possible blood culture contaminant (unless isolated from more than one blood culture draw or clinical case suggests pathogenicity). No antibiotic treatment is indicated for blood  culture contaminants. CRITICAL RESULT CALLED TO, READ BACK BY AND VERIFIED WITH: D,JONES RN @2257  04/18/20 EB    Staphylococcus aureus (BCID) NOT DETECTED NOT DETECTED Final   Methicillin resistance DETECTED  (A) NOT DETECTED Final    Comment: CRITICAL RESULT CALLED TO, READ BACK BY AND VERIFIED WITH: D,JONES RN @2257  04/18/20 EB    Streptococcus species NOT DETECTED NOT DETECTED Final   Streptococcus agalactiae NOT DETECTED NOT DETECTED Final   Streptococcus pneumoniae NOT DETECTED NOT DETECTED Final   Streptococcus pyogenes NOT DETECTED NOT DETECTED Final   Acinetobacter baumannii NOT DETECTED NOT DETECTED Final   Enterobacteriaceae species NOT DETECTED NOT DETECTED Final   Enterobacter cloacae complex NOT DETECTED NOT DETECTED Final   Escherichia coli NOT DETECTED NOT DETECTED Final   Klebsiella oxytoca NOT DETECTED NOT DETECTED Final   Klebsiella pneumoniae NOT DETECTED NOT DETECTED Final   Proteus species NOT DETECTED NOT DETECTED Final   Serratia marcescens NOT DETECTED NOT DETECTED Final   Haemophilus influenzae NOT DETECTED NOT DETECTED Final   Neisseria meningitidis NOT DETECTED NOT DETECTED Final   Pseudomonas aeruginosa NOT DETECTED NOT DETECTED Final   Candida albicans NOT DETECTED NOT DETECTED Final   Candida glabrata NOT DETECTED NOT DETECTED Final   Candida krusei NOT DETECTED NOT DETECTED Final   Candida parapsilosis NOT DETECTED NOT DETECTED Final   Candida tropicalis NOT DETECTED NOT DETECTED Final    Comment: Performed at [facility], [place]. [address], [address] [place], [place] [address]    Radiology Reports DG Chest 1 View  Result Date: 04/17/2020 CLINICAL DATA:  Chest pain, altered mental status EXAM: CHEST  1 VIEW COMPARISON:  02/21/2020 FINDINGS: The generalized lung apices, slightly limiting evaluation. The lungs are symmetrically expanded and are clear. No definite pneumothorax. No pleural effusion. The cardiomediastinal silhouette is unremarkable. Pulmonary vascularity is normal. Surgical clips are noted within the axilla bilaterally. No acute bone abnormality IMPRESSION: No active disease. Electronically Signed   By: Cassandria Anger  Christa See MD   On: 04/17/2020 15:11   CT Head  Wo Contrast  Result Date: 04/17/2020 CLINICAL DATA:  Delirium EXAM: CT HEAD WITHOUT CONTRAST TECHNIQUE: Contiguous axial images were obtained from the base of the skull through the vertex without intravenous contrast. COMPARISON:  Feb 21, 2020 FINDINGS: Brain: No evidence of acute territorial infarction, hemorrhage, hydrocephalus,extra-axial collection or mass lesion/mass effect. There is dilatation the ventricles and sulci consistent with age-related atrophy. Low-attenuation changes in the deep white matter consistent with small vessel ischemia. Prior lacunar infarct seen within the right basal ganglia. Vascular: No hyperdense vessel or unexpected calcification. Skull: The skull is intact. No fracture or focal lesion identified. Sinuses/Orbits: The visualized paranasal sinuses and mastoid air cells are clear. The orbits and globes intact. Other: None IMPRESSION: No acute intracranial abnormality. Findings consistent with age related atrophy and chronic small vessel ischemia Electronically Signed   By: Prudencio Pair M.D.   On: 04/17/2020 17:00   [place] EKG SITE RITE  Result Date: 04/18/2020 If Site Rite image not attached, placement could not be confirmed due to current cardiac rhythm.    CBC Recent Labs  Lab 04/17/20 1619 04/20/20 0821  WBC 9.2 6.3  HGB 10.5* 9.6*  HCT 34.5* 31.3*  PLT 316 291  MCV 86.5 85.8  MCH 26.3 26.3  MCHC 30.4 30.7  RDW 15.1 14.9  LYMPHSABS 0.9  --   MONOABS 0.7  --   EOSABS 0.7*  --   BASOSABS 0.1  --     Chemistries  Recent Labs  Lab 04/17/20 1619 04/20/20 0821 04/21/20 0625  NA 134* 140 140  K 3.8 3.0* 2.6*  CL 96* 104 104  CO2 29 28 28   GLUCOSE 112* 102* 100*  BUN 34* 19 18  CREATININE 1.55* 1.28* 1.21*  CALCIUM 12.7* 12.3* 12.3*  MG 1.9  --  1.5*  AST 20  --   --   ALT 13  --   --   ALKPHOS 100  --   --   BILITOT 0.5  --   --     ------------------------------------------------------------------------------------------------------------------ No results for input(s): CHOL, HDL, LDLCALC, TRIG, CHOLHDL, LDLDIRECT in the last 72 hours.  No results found for: HGBA1C ------------------------------------------------------------------------------------------------------------------ No results for input(s): TSH, T4TOTAL, T3FREE, THYROIDAB in the last 72 hours.  Invalid input(s): FREET3 ------------------------------------------------------------------------------------------------------------------ No results for input(s): VITAMINB12, FOLATE, FERRITIN, TIBC, IRON, RETICCTPCT in the last 72 hours.  Coagulation profile Recent Labs  Lab 04/17/20 1619  INR 1.0    No results for input(s): DDIMER in the last 72 hours.  Cardiac Enzymes No results for input(s): CKMB, TROPONINI, MYOGLOBIN in the last 168 hours.  Invalid input(s): CK ------------------------------------------------------------------------------------------------------------------    Component Value Date/Time   BNP 21.0 02/21/2020 1614   Lorrane Mccay M.D on 04/21/2020 at 4:43 PM  Go to [email or web address] - for contact info  [facility] - Office  [phone]

## 2020-04-22 LAB — CULTURE, BLOOD (ROUTINE X 2)
Culture: NO GROWTH
Special Requests: ADEQUATE

## 2020-04-22 LAB — BASIC METABOLIC PANEL
Anion gap: 8 (ref 5–15)
BUN: 17 mg/dL (ref 8–23)
CO2: 28 mmol/L (ref 22–32)
Calcium: 12.5 mg/dL — ABNORMAL HIGH (ref 8.9–10.3)
Chloride: 108 mmol/L (ref 98–111)
Creatinine, Ser: 1.18 mg/dL — ABNORMAL HIGH (ref 0.44–1.00)
GFR calc Af Amer: 46 mL/min — ABNORMAL LOW (ref >60)
GFR calc non Af Amer: 40 mL/min — ABNORMAL LOW (ref >60)
Glucose, Bld: 106 mg/dL — ABNORMAL HIGH (ref 70–99)
Potassium: 3.5 mmol/L (ref 3.5–5.1)
Sodium: 144 mmol/L (ref 135–145)

## 2020-04-22 MED ORDER — POTASSIUM CHLORIDE CRYS ER 20 MEQ PO TBCR
40.0000 meq | EXTENDED_RELEASE_TABLET | Freq: Once | ORAL | Status: AC
  Administered 2020-04-22: 40 meq via ORAL
  Filled 2020-04-22: qty 2

## 2020-04-22 NOTE — Progress Notes (Signed)
  Speech Language Pathology Treatment: Dysphagia  Patient Details Name: Kristie Morris MRN: [id number] DOB: 1925-09-06 Today's Date: 04/22/2020 Time: 4975-3005 SLP Time Calculation (min) (ACUTE ONLY): 20 min  Assessment / Plan / Recommendation Clinical Impression  Pt seen at bedside for liquid trials (nectar and thin). She required verbal and tactile cues to close her lips around the straw to drink. Pt presented with immediate strong coughing after thin liquids, consumed NTL without incident. Recommend continuing diet as ordered D3 and NTL with consideration of MBSS tomorrow to see if Pt can tolerate small sips/adjustments for thin liquids if Pt still here.    HPI HPI: Chakita Mcgraw. Shuffield is a 86 y.o. female with medical history significant for diastolic CHF, COPD, asthma, hypertension, CVA with residual left hemiparesis admitted on 04/17/20 with altered mentation/disorientation in the setting of presumed UTI and GPC bacteremia altered mentation and disorientation in the setting of presumed UTI and GPC bacteremia. Pt reportedly had a "choking episode" with coffee yesterday, 04/19/2020, BSE ordered. Granddaughter reports occasional "choking" at home      SLP Plan  Continue with current plan of care;MBS (can complete MBSS tomorrow if Pt agreeable)       Recommendations  Diet recommendations: Nectar-thick liquid;Dysphagia 3 (mechanical soft) Liquids provided via: Cup;Straw Medication Administration: Crushed with puree Supervision: Staff to assist with self feeding Compensations: Slow rate;Small sips/bites;Monitor for anterior loss;Multiple dry swallows after each bite/sip;Follow solids with liquid;Clear throat intermittently Postural Changes and/or Swallow Maneuvers: Seated upright 90 degrees;Upright 30-60 min after meal                Oral Care Recommendations: Oral care prior to ice chip/H20 Follow up Recommendations: 24 hour supervision/assistance;Skilled Nursing facility SLP Visit  Diagnosis: Dysphagia, unspecified (R13.10) Plan: Continue with current plan of care;MBS (can complete MBSS tomorrow if Pt agreeable)       Thank you,  Genene Churn, [place]                 [place] 04/22/2020, 3:31 PM

## 2020-04-22 NOTE — Care Management Important Message (Signed)
Important Message  Patient Details  Name: Kristie Morris MRN: [id number] Date of Birth: 01-02-1925   Medicare Important Message Given:  Yes     Tommy Medal 04/22/2020, 2:41 PM

## 2020-04-22 NOTE — Progress Notes (Signed)
Pharmacy Antibiotic Note  Today is day #4 of cefepime therapy for this 86 yo female admitted on 04/17/20 for  pseudomonal UTI.   Plan:  Continue cefepime 1g IV q24h Monitor clinical picture, renal function F/U C&S, abx de-escalation, LOT   Height: 5\' 4"  (162.6 cm) Weight: 74.9 kg (165 lb 2 oz) IBW/kg (Calculated) : 54.7  Temp (24hrs), Avg:99 F (37.2 C), Min:97.8 F (36.6 C), Max:99.6 F (37.6 C)  Recent Labs  Lab 04/17/20 1619 04/20/20 0821 04/21/20 0625 04/22/20 0701  WBC 9.2 6.3  --   --   CREATININE 1.55* 1.28* 1.21* 1.18*  LATICACIDVEN 1.1  --   --   --     Estimated Creatinine Clearance: 29.5 mL/min (A) (by C-G formula based on SCr of 1.18 mg/dL (H)).    Antimicrobials this admission: Cefepime 7/16> CTX 7/14>7/16  Microbiology: 7/14 BCx GPCs- ->S. epidermidis    - BCID w MR CONS (contaminant?) 7/14 UCx 30k Pseudomonas   Despina Pole, Pharm. D. Clinical Pharmacist 04/22/2020 3:22 PM

## 2020-04-22 NOTE — Progress Notes (Signed)
Patient Demographics:    Kristie Morris, is a 86 y.o. female, DOB - Jul 13, 1925, IDP:[id number]  Admit date - 04/17/2020   Admitting Physician Teiara Baria Denton Brick, MD  Outpatient Primary MD for the patient is [name], NP  LOS - 4  Chief Complaint  Patient presents with  . possible UTI        Subjective:    Kristie Morris today has no fevers, no emesis,  No chest pain,   - Confusion and disorientation persist Discussed with POA--Grandson Mahtomedi approval to go to SNF rehab   Assessment  & Plan :    Principal Problem:   Acute metabolic encephalopathy Active Problems:   Bacteremia due to Gram-positive bacteria   COPD (chronic obstructive pulmonary disease) ([place])   Personal history of stroke with current residual effects   Wheelchair bound   Asthma, chronic   Essential hypertension   Bacteremia  Brief Summary:- 86 y.o. female with medical history significant for diastolic CHF, COPD, asthma, hypertension, CVA with residual left hemiparesis admitted on 04/17/20 with altered mentation/disorientation in the setting of presumed UTI and GPC bacteremia altered mentation and disorientation in the setting of presumed UTI and GPC bacteremia  A/p 1)Acute Metabolic Encephalopathy-- due to dehydration, AKI and presumed UTI and GPC bacteremia --Anticipate improvement with improvement of listed conditions -As per family patient is still not back to baseline from a cognitive standpoint  2)CONS bacteremia---??? Contaminants--- treated initially with IV Rocephin-no further antibiotics required for this particular indication -No fevers, no tachypnea, no tachycardia, no leukocytosis, lactic acid is not elevated--- patient does not meet sepsis criteria  3) Pseudomonas-UTI--- continue IV cefepime started on 04/19/2020 for additional 3 to 4 days  4)AKI----acute kidney injury due to dehydration and  concomitant meloxicam use -- creatinine on admission= 1.55  , baseline creatinine = 1.1 (10/27/19)   -Creatinine is down to 1.1 ---, renally adjust medications, avoid nephrotoxic agents / dehydration  / hypotension  -.hydrate  IV and p.o.  5)Poor Venous Access--limiting ability to give IV fluids and IV antibiotics,-had  ultrasound assisted midline placement to the left arm  6) ambulatory dysfunction--- at baseline patient was able to stand and pivot but she gets around with a wheelchair she does not walk -PT eval appreciated, SNF recommended  -PT/OT evaluations performed. SNF Rehab recommended. SNF appropriate as the patient has received 3 days of hospital care (or the 3 day period has been waived by the patient's insurance company) and is felt to need rehab services to restore this patient to their prior level of function to achieve safe transition back to home care. This patient needs rehab services for at least 5 days per week and skilled nursing services daily to facilitate this transition. Rehab is being requested as the most appropriate d/c option for this patient and is NOT felt to be for custodial care as evidenced by previously able to stand, pivot and help with transfers-- prior to admission (comment on the patient's prior level of function). -She was requiring assist of 1 with ADLs prior to admission   7)Prior history of stroke more than 5 years ago with residual left-sided hemiparesis--- please see #6 above -Continue aspirin and crestor for secondary stroke prophylaxis - 8)Depression--- stable continue Cymbalta, and  trazodone  9)Dysphagia--speech and swallow evaluation appreciated, recommends dysphagia 3 (Mech soft);Nectar-thick liquid   10) hypokalemia/Hypomagnesiemia--potassium is 2.6 replace and recheck -Magnesium is 1.5--replace and recheck  Disposition/Need for in-Hospital Stay- patient unable to be discharged at this time due to --Awaiting Insurance approval to go to SNF  rehab   Status is: Inpatient  Remains inpatient appropriate because:Awaiting Insurance approval to go to SNF rehab   Disposition: The patient is from: Home              Anticipated d/c is to: Home              Anticipated d/c date is: 2 days              Patient currently is medically stable to d/c. Barriers: Not Clinically Stable- - -Awaiting Insurance approval to go to SNF rehab  Code Status : Full  Family Communication: discussed with grandson and granddaughter  Consults  :  na  DVT Prophylaxis  :   - Heparin - SCDs   Lab Results  Component Value Date   PLT 291 04/20/2020   Inpatient Medications  Scheduled Meds: . amLODipine  5 mg Oral Daily  . aspirin EC  81 mg Oral Daily  . chlorhexidine  15 mL Mouth Rinse BID  . heparin  5,000 Units Subcutaneous Q8H  . mouth rinse  15 mL Mouth Rinse q12n4p  . oxybutynin  10 mg Oral QHS  . pantoprazole  40 mg Oral Q1500  . rosuvastatin  5 mg Oral QPM  . sodium chloride flush  10-40 mL Intracatheter Q12H  . traZODone  50 mg Oral QHS   Continuous Infusions: . sodium chloride 40 mL/hr at 04/22/20 0908  . ceFEPime (MAXIPIME) IV 1 g (04/21/20 2015)   PRN Meds:.acetaminophen **OR** acetaminophen, albuterol, labetalol, ondansetron **OR** ondansetron (ZOFRAN) IV, polyethylene glycol, sodium chloride flush    Anti-infectives (From admission, onward)   Start     Dose/Rate Route Frequency Ordered Stop   04/19/20 2000  ceFEPIme (MAXIPIME) 1 g in sodium chloride 0.9 % 100 mL IVPB     Discontinue     1 g 200 mL/hr over 30 Minutes Intravenous Every 24 hours 04/19/20 1949     04/18/20 1645  cefTRIAXone (ROCEPHIN) 2 g in sodium chloride 0.9 % 100 mL IVPB  Status:  Discontinued        2 g 200 mL/hr over 30 Minutes Intravenous Every 24 hours 04/18/20 1625 04/19/20 1928   04/17/20 1530  cefTRIAXone (ROCEPHIN) 1 g in sodium chloride 0.9 % 100 mL IVPB  Status:  Discontinued        1 g 200 mL/hr over 30 Minutes Intravenous Every 24 hours  04/17/20 1517 04/17/20 2212        Objective:   Vitals:   04/21/20 2150 04/22/20 0434 04/22/20 0503 04/22/20 1510  BP: (!) 158/58 (!) 156/56  138/79  Pulse: (!) 102 (!) 108  (!) 104  Resp: 18 15  17   Temp: 99.6 F (37.6 C) 99.6 F (37.6 C)  97.8 F (36.6 C)  TempSrc: Axillary Axillary  Axillary  SpO2: 90% 97%  92%  Weight:   74.9 kg   Height:        Wt Readings from Last 3 Encounters:  04/22/20 74.9 kg  02/21/20 83.5 kg  02/21/20 83.5 kg     Intake/Output Summary (Last 24 hours) at 04/22/2020 1944 Last data filed at 04/22/2020 1900 Gross per 24 hour  Intake 0 ml  Output  2200 ml  Net -2200 ml    Physical Exam Gen:- Awake , less confused, less disoriented HEENT:- [place].AT, No sclera icterus Neck-Supple Neck,No JVD,.  Lungs-  CTAB , fair symmetrical air movement CV- S1, S2 normal, regular  Abd-  +ve B.Sounds, Abd Soft, No tenderness, no CVA area tenderness    Extremity/Skin:- No  edema, pedal pulses present  Psych-affect is intermittently confused with intermittent disorientation neuro-residual left-sided hemiparesis, no new focal deficits, no tremors   Data Review:   Micro Results Recent Results (from the past 240 hour(s))  Urine culture     Status: Abnormal   Collection Time: 04/17/20  2:34 PM   Specimen: In/Out Cath Urine  Result Value Ref Range Status   Specimen Description   Final    IN/OUT CATH URINE Performed at [facility], [address], [place], [place] [address]    Special Requests   Final    NONE Performed at [facility], [address] [place], [place] [address]    Culture 30,000 COLONIES/mL PSEUDOMONAS AERUGINOSA (A)  Final   Report Status 04/20/2020 FINAL  Final   Organism ID, Bacteria PSEUDOMONAS AERUGINOSA (A)  Final      Susceptibility   Pseudomonas aeruginosa - MIC*    CEFTAZIDIME 2 SENSITIVE Sensitive     CIPROFLOXACIN <=0.25 SENSITIVE Sensitive     GENTAMICIN <=1 SENSITIVE Sensitive     IMIPENEM 1 SENSITIVE Sensitive     PIP/TAZO 8  SENSITIVE Sensitive     CEFEPIME 2 SENSITIVE Sensitive     * 30,000 COLONIES/mL PSEUDOMONAS AERUGINOSA  SARS Coronavirus 2 by RT PCR (hospital order, performed in [place] hospital lab) Nasopharyngeal Nasopharyngeal Swab     Status: None   Collection Time: 04/17/20  3:43 PM   Specimen: Nasopharyngeal Swab  Result Value Ref Range Status   SARS Coronavirus 2 NEGATIVE NEGATIVE Final    Comment: (NOTE) SARS-CoV-2 target nucleic acids are NOT DETECTED.  The SARS-CoV-2 RNA is generally detectable in upper and lower respiratory specimens during the acute phase of infection. The lowest concentration of SARS-CoV-2 viral copies this assay can detect is 250 copies / mL. A negative result does not preclude SARS-CoV-2 infection and should not be used as the sole basis for treatment or other patient management decisions.  A negative result may occur with improper specimen collection / handling, submission of specimen other than nasopharyngeal swab, presence of viral mutation(s) within the areas targeted by this assay, and inadequate number of viral copies (<250 copies / mL). A negative result must be combined with clinical observations, patient history, and epidemiological information.  Fact Sheet for Patients:   StrictlyIdeas.no  Fact Sheet for Healthcare Providers: BankingDealers.co.za  This test is not yet approved or  cleared by the [place] FDA and has been authorized for detection and/or diagnosis of SARS-CoV-2 by FDA under an Emergency Use Authorization (EUA).  This EUA will remain in effect (meaning this test can be used) for the duration of the COVID-19 declaration under Section 564(b)(1) of the Act, 21 U.S.C. section 360bbb-3(b)(1), unless the authorization is terminated or revoked sooner.  Performed at [facility], [address], [place], [place] [address]   Blood Culture (routine x 2)     Status: None   Collection Time:  04/17/20  4:19 PM   Specimen: Right Antecubital; Blood  Result Value Ref Range Status   Specimen Description RIGHT ANTECUBITAL  Final   Special Requests   Final    BOTTLES DRAWN AEROBIC AND ANAEROBIC  Blood Culture adequate volume   Culture   Final    NO GROWTH 5 DAYS Performed at [facility], [address], [place], [place] [address]    Report Status 04/22/2020 FINAL  Final  Blood Culture (routine x 2)     Status: Abnormal   Collection Time: 04/17/20  4:24 PM   Specimen: Left Antecubital; Blood  Result Value Ref Range Status   Specimen Description   Final    LEFT ANTECUBITAL Performed at [facility], [address] [place], [place] [address]    Special Requests   Final    BOTTLES DRAWN AEROBIC AND ANAEROBIC Blood Culture adequate volume Performed at [facility], [address], [place], [place] [address]    Culture  Setup Time   Final    BOTH BOTTLES GRAM POSITIVE COCCI Gram Stain Report Called to,Read Back By and Verified With: Lowry Ram 04/18/20 @1619  BY JONEST  CRITICAL RESULT CALLED TO, READ BACK BY AND VERIFIED WITH: D,JONES RN @2257  04/18/20 EB    Culture (A)  Final    STAPHYLOCOCCUS EPIDERMIDIS THE SIGNIFICANCE OF ISOLATING THIS ORGANISM FROM A SINGLE SET OF BLOOD CULTURES WHEN MULTIPLE SETS ARE DRAWN IS UNCERTAIN. PLEASE NOTIFY THE MICROBIOLOGY DEPARTMENT WITHIN ONE WEEK IF SPECIATION AND SENSITIVITIES ARE REQUIRED. Performed at [facility], [place] [address], [place], [place] [address]    Report Status 04/20/2020 FINAL  Final  Blood Culture ID Panel (Reflexed)     Status: Abnormal   Collection Time: 04/17/20  4:24 PM  Result Value Ref Range Status   Enterococcus species NOT DETECTED NOT DETECTED Final   Listeria monocytogenes NOT DETECTED NOT DETECTED Final   Staphylococcus species DETECTED (A) NOT DETECTED Final    Comment: Methicillin (oxacillin) resistant coagulase negative staphylococcus. Possible blood culture contaminant (unless isolated from more than  one blood culture draw or clinical case suggests pathogenicity). No antibiotic treatment is indicated for blood  culture contaminants. CRITICAL RESULT CALLED TO, READ BACK BY AND VERIFIED WITH: D,JONES RN @2257  04/18/20 EB    Staphylococcus aureus (BCID) NOT DETECTED NOT DETECTED Final   Methicillin resistance DETECTED (A) NOT DETECTED Final    Comment: CRITICAL RESULT CALLED TO, READ BACK BY AND VERIFIED WITH: D,JONES RN @2257  04/18/20 EB    Streptococcus species NOT DETECTED NOT DETECTED Final   Streptococcus agalactiae NOT DETECTED NOT DETECTED Final   Streptococcus pneumoniae NOT DETECTED NOT DETECTED Final   Streptococcus pyogenes NOT DETECTED NOT DETECTED Final   Acinetobacter baumannii NOT DETECTED NOT DETECTED Final   Enterobacteriaceae species NOT DETECTED NOT DETECTED Final   Enterobacter cloacae complex NOT DETECTED NOT DETECTED Final   Escherichia coli NOT DETECTED NOT DETECTED Final   Klebsiella oxytoca NOT DETECTED NOT DETECTED Final   Klebsiella pneumoniae NOT DETECTED NOT DETECTED Final   Proteus species NOT DETECTED NOT DETECTED Final   Serratia marcescens NOT DETECTED NOT DETECTED Final   Haemophilus influenzae NOT DETECTED NOT DETECTED Final   Neisseria meningitidis NOT DETECTED NOT DETECTED Final   Pseudomonas aeruginosa NOT DETECTED NOT DETECTED Final   Candida albicans NOT DETECTED NOT DETECTED Final   Candida glabrata NOT DETECTED NOT DETECTED Final   Candida krusei NOT DETECTED NOT DETECTED Final   Candida parapsilosis NOT DETECTED NOT DETECTED Final   Candida tropicalis NOT DETECTED NOT DETECTED Final    Comment: Performed at [facility], [place]. [address] [place], [place] [address]    Radiology Reports DG Chest 1 View  Result Date: 04/17/2020 CLINICAL  DATA:  Chest pain, altered mental status EXAM: CHEST  1 VIEW COMPARISON:  02/21/2020 FINDINGS: The generalized lung apices, slightly limiting evaluation. The lungs are symmetrically expanded and are  clear. No definite pneumothorax. No pleural effusion. The cardiomediastinal silhouette is unremarkable. Pulmonary vascularity is normal. Surgical clips are noted within the axilla bilaterally. No acute bone abnormality IMPRESSION: No active disease. Electronically Signed   By: Fidela Salisbury MD   On: 04/17/2020 15:11   CT Head Wo Contrast  Result Date: 04/17/2020 CLINICAL DATA:  Delirium EXAM: CT HEAD WITHOUT CONTRAST TECHNIQUE: Contiguous axial images were obtained from the base of the skull through the vertex without intravenous contrast. COMPARISON:  Feb 21, 2020 FINDINGS: Brain: No evidence of acute territorial infarction, hemorrhage, hydrocephalus,extra-axial collection or mass lesion/mass effect. There is dilatation the ventricles and sulci consistent with age-related atrophy. Low-attenuation changes in the deep white matter consistent with small vessel ischemia. Prior lacunar infarct seen within the right basal ganglia. Vascular: No hyperdense vessel or unexpected calcification. Skull: The skull is intact. No fracture or focal lesion identified. Sinuses/Orbits: The visualized paranasal sinuses and mastoid air cells are clear. The orbits and globes intact. Other: None IMPRESSION: No acute intracranial abnormality. Findings consistent with age related atrophy and chronic small vessel ischemia Electronically Signed   By: Prudencio Pair M.D.   On: 04/17/2020 17:00   [place] EKG SITE RITE  Result Date: 04/18/2020 If Site Rite image not attached, placement could not be confirmed due to current cardiac rhythm.    CBC Recent Labs  Lab 04/17/20 1619 04/20/20 0821  WBC 9.2 6.3  HGB 10.5* 9.6*  HCT 34.5* 31.3*  PLT 316 291  MCV 86.5 85.8  MCH 26.3 26.3  MCHC 30.4 30.7  RDW 15.1 14.9  LYMPHSABS 0.9  --   MONOABS 0.7  --   EOSABS 0.7*  --   BASOSABS 0.1  --     Chemistries  Recent Labs  Lab 04/17/20 1619 04/20/20 0821 04/21/20 0625 04/22/20 0701  NA 134* 140 140 144  K 3.8 3.0* 2.6* 3.5  CL  96* 104 104 108  CO2 29 28 28 28   GLUCOSE 112* 102* 100* 106*  BUN 34* 19 18 17   CREATININE 1.55* 1.28* 1.21* 1.18*  CALCIUM 12.7* 12.3* 12.3* 12.5*  MG 1.9  --  1.5*  --   AST 20  --   --   --   ALT 13  --   --   --   ALKPHOS 100  --   --   --   BILITOT 0.5  --   --   --    ------------------------------------------------------------------------------------------------------------------ No results for input(s): CHOL, HDL, LDLCALC, TRIG, CHOLHDL, LDLDIRECT in the last 72 hours.  No results found for: HGBA1C ------------------------------------------------------------------------------------------------------------------ No results for input(s): TSH, T4TOTAL, T3FREE, THYROIDAB in the last 72 hours.  Invalid input(s): FREET3 ------------------------------------------------------------------------------------------------------------------ No results for input(s): VITAMINB12, FOLATE, FERRITIN, TIBC, IRON, RETICCTPCT in the last 72 hours.  Coagulation profile Recent Labs  Lab 04/17/20 1619  INR 1.0    No results for input(s): DDIMER in the last 72 hours.  Cardiac Enzymes No results for input(s): CKMB, TROPONINI, MYOGLOBIN in the last 168 hours.  Invalid input(s): CK ------------------------------------------------------------------------------------------------------------------    Component Value Date/Time   BNP 21.0 02/21/2020 1614   Rosemary Mossbarger M.D on 04/22/2020 at 7:44 PM  Go to [email or web address] - for contact info  [facility] - Office  [phone]

## 2020-04-22 NOTE — TOC Progression Note (Signed)
Transition of Care [facility]) - Progression Note    Patient Details  Name: Kristie Morris MRN: [id number] Date of Birth: Mar 10, 1925  Transition of [facility]) CM/SW Contact  Shade Flood, LCSW Phone Number: 04/22/2020, 2:05 PM  Clinical Narrative:     TOC following. Spoke with pt's son this AM at his request regarding SNF preferences. At this time, [facility] [place] [facility] has been selected. Insurance authorization is still pending.   MD states pt is stable for dc if/when auth is received.   TOC will follow and assist.  Expected Discharge Plan: [place] Barriers to Discharge: Insurance Authorization  Expected Discharge Plan and Services Expected Discharge Plan: [place]       Living arrangements for the past 2 months: Single Family Home                                       Social Determinants of Health (SDOH) Interventions    Readmission Risk Interventions No flowsheet data found.

## 2020-04-23 ENCOUNTER — Inpatient Hospital Stay (HOSPITAL_COMMUNITY): Payer: Medicare HMO

## 2020-04-23 ENCOUNTER — Ambulatory Visit: Payer: Medicare HMO | Admitting: Family Medicine

## 2020-04-23 LAB — MAGNESIUM: Magnesium: 2 mg/dL (ref 1.7–2.4)

## 2020-04-23 NOTE — NC FL2 (Signed)
[place] MEDICAID FL2 LEVEL OF CARE SCREENING TOOL     IDENTIFICATION  Patient Name: Kristie Morris Birthdate: September 28, 1925 Sex: female Admission Date (Current Location): 04/17/2020  [facility] and [place] Number:  [employer] and Address:  [place] [address] [place]      Provider Number: [id number]  Attending Physician Name and Address:  Roxan Hockey, MD  Relative Name and Phone Number:  Kandyce Rud [phone]    Current Level of Care: Hospital Recommended Level of Care: [place] Prior Approval Number: [id number]  Date Approved/Denied:   PASRR Number:    Discharge Plan: SNF    Current Diagnoses: Patient Active Problem List   Diagnosis Date Noted   Bacteremia due to Gram-positive bacteria 04/18/2020   Bacteremia 94/17/4081   Acute metabolic encephalopathy 44/81/8563   Fall 02/22/2020   Primary osteoarthritis 01/17/2020   Post-menopausal 10/24/2019   Wheelchair bound 10/24/2019   Overactive bladder 10/17/2019   Sciatica of right side 05/29/2015   Essential hypertension 04/27/2015   Personal history of stroke with current residual effects 04/27/2015   Abnormality of gait 01/08/2015   Recurrent falls 01/08/2015   Pre-syncope 01/08/2015   Left-sided weakness 14/97/0263   Diastolic dysfunction 78/58/8502   Asthma, chronic 02/02/2014   COPD (chronic obstructive pulmonary disease) ([place]) 02/02/2014   Hyperlipidemia 02/02/2014    Orientation RESPIRATION BLADDER Height & Weight     Self  Normal External catheter Weight: 164 lb 14.5 oz (74.8 kg) Height:  5\' 4"  (162.6 cm)  BEHAVIORAL SYMPTOMS/MOOD NEUROLOGICAL BOWEL NUTRITION STATUS      Continent Diet (Honey thick liquids by teaspoon with a chin tuck. DYS 2.)  AMBULATORY STATUS COMMUNICATION OF NEEDS Skin   Extensive Assist Verbally Normal                       Personal Care Assistance Level of Assistance  Bathing,  Dressing, Feeding Bathing Assistance: Maximum assistance Feeding assistance: Limited assistance Dressing Assistance: Maximum assistance     Functional Limitations Info  Sight, Hearing, Speech Sight Info: Impaired Hearing Info: Adequate Speech Info: Adequate    SPECIAL CARE FACTORS FREQUENCY  PT (By licensed PT)     PT Frequency: 5x              Contractures Contractures Info: Not present    Additional Factors Info  Code Status, Allergies Code Status Info: Full Allergies Info: Zithromax, Celebrex, Ciprofloxacin, Metronidazole, Morphine And Related, Moxifloxacin, Penicillins,Polymyxin B, Sulfa Antibiotics, Vancomycin, Vioxx           Current Medications (04/23/2020):  This is the current hospital active medication list Current Facility-Administered Medications  Medication Dose Route Frequency Provider Last Rate Last Admin   0.9 %  sodium chloride infusion   Intravenous Continuous Denton Brick, Courage, MD 40 mL/hr at 04/22/20 0908 Rate Change at 04/22/20 0908   acetaminophen (TYLENOL) tablet 650 mg  650 mg Oral Q6H PRN Emokpae, Ejiroghene E, MD   650 mg at 04/20/20 2110   Or   acetaminophen (TYLENOL) suppository 650 mg  650 mg Rectal Q6H PRN Emokpae, Ejiroghene E, MD       albuterol (PROVENTIL) (2.5 MG/3ML) 0.083% nebulizer solution 2.5 mg  2.5 mg Nebulization Q4H PRN Emokpae, Ejiroghene E, MD       amLODipine (NORVASC) tablet 5 mg  5 mg Oral Daily Emokpae, Ejiroghene E, MD   5 mg at 04/23/20 0947   aspirin EC tablet 81 mg  81 mg  Oral Daily Emokpae, Ejiroghene E, MD   81 mg at 04/23/20 0946   ceFEPIme (MAXIPIME) 1 g in sodium chloride 0.9 % 100 mL IVPB  1 g Intravenous Q24H Emokpae, Courage, MD 200 mL/hr at 04/22/20 1957 1 g at 04/22/20 1957   chlorhexidine (PERIDEX) 0.12 % solution 15 mL  15 mL Mouth Rinse BID Emokpae, Courage, MD   15 mL at 04/23/20 0948   heparin injection 5,000 Units  5,000 Units Subcutaneous Q8H Emokpae, Ejiroghene E, MD   5,000 Units at 04/23/20  0529   labetalol (NORMODYNE) injection 10 mg  10 mg Intravenous Q4H PRN Emokpae, Courage, MD       MEDLINE mouth rinse  15 mL Mouth Rinse q12n4p Emokpae, Courage, MD   15 mL at 04/23/20 1200   ondansetron (ZOFRAN) tablet 4 mg  4 mg Oral Q6H PRN Emokpae, Ejiroghene E, MD       Or   ondansetron (ZOFRAN) injection 4 mg  4 mg Intravenous Q6H PRN Emokpae, Ejiroghene E, MD       oxybutynin (DITROPAN) tablet 10 mg  10 mg Oral QHS Emokpae, Ejiroghene E, MD   10 mg at 04/22/20 2204   pantoprazole (PROTONIX) EC tablet 40 mg  40 mg Oral Q1500 Emokpae, Ejiroghene E, MD   40 mg at 04/22/20 1436   polyethylene glycol (MIRALAX / GLYCOLAX) packet 17 g  17 g Oral Daily PRN Emokpae, Ejiroghene E, MD       rosuvastatin (CRESTOR) tablet 5 mg  5 mg Oral QPM Emokpae, Ejiroghene E, MD   5 mg at 04/22/20 1645   sodium chloride flush (NS) 0.9 % injection 10-40 mL  10-40 mL Intracatheter Q12H Emokpae, Courage, MD   10 mL at 04/22/20 2205   sodium chloride flush (NS) 0.9 % injection 10-40 mL  10-40 mL Intracatheter PRN Emokpae, Courage, MD       traZODone (DESYREL) tablet 50 mg  50 mg Oral QHS Emokpae, Courage, MD   50 mg at 04/22/20 2204     Discharge Medications: Please see discharge summary for a list of discharge medications.  Relevant Imaging Results:  Relevant Lab Results:   Additional Information    Leovanni Bjorkman, Clydene Pugh, LCSW

## 2020-04-23 NOTE — Evaluation (Signed)
Modified Barium Swallow Progress Note  Patient Details  Name: Kristie Morris MRN: [id number] Date of Birth: 19-Apr-1925  Today's Date: 04/23/2020  Modified Barium Swallow completed.  Full report located under Chart Review in the Imaging Section.  Brief recommendations include the following:  Clinical Impression  Pt presents with severe oropharyngeal dysphagia compounded by altered mental status; swallowing is characterized by poor awareness of bolus, severe anterior spillage, poor bolus cohesion, prolonged AP transit, lingual pumping, premature spillage pooling into the pyriforms, delayed swallowing trigger, penetration of all liquids (thin, NTL & HTL), and trace to mod SILENT aspiration (with thin, NTL & HTL) (this was sensed X2 out of multiple episodes of aspiration). Note decreased pharyngeal squeeze and decreased laryngeal vestibule closure. At this time, Pt is cognitively unable to consistently participate in strategies however she naturally remains in the position of a chin tuck which was effective in decreasing/eliminating aspiration with tsp sips of HTL. No penetration/aspiration was noted with puree or solid textures however solid texture trials were limited secondary to fatigue. Pt is at VERY HIGH risk of aspiration as trace-mod aspiration was noted with all liquid textures however least restrictive diet is D2/fine chop and HTL (administered with tsp); recommend precautions: small bites/sips, Pt should be seated upright, chin tuck (natural positioning) is ok and recommended for swallowing. Also note Pt is at risk for inadequate nutrition and dehydration. Pt is a good candidate for free water protocol (oral care in between meals followed by ice chips or WATER by teaspoon). Recommend consider repeat MBS after Pt's mentation has improved, subjective upgrade will be challenging secondary to multiple episodes of silent aspiration. ST will continue to follow acutely.   Swallow Evaluation  Recommendations       SLP Diet Recommendations: Dysphagia 2 (Fine chop) solids;Honey thick liquids (HTL by tsp)   Liquid Administration via: Spoon   Medication Administration: Whole meds with puree   Supervision: Full assist for feeding;Full supervision/cueing for compensatory strategies   Compensations: Minimize environmental distractions;Slow rate;Small sips/bites;Monitor for anterior loss;Multiple dry swallows after each bite/sip;Clear throat after each swallow;Clear throat intermittently   Postural Changes: Remain semi-upright after after feeds/meals (Comment);Seated upright at 90 degrees   Oral Care Recommendations: Oral care BID   Other Recommendations: Order thickener from [place]. Roddie Mc, CCC-SLP Speech Language Pathologist  Wende Bushy 04/23/2020,3:27 PM

## 2020-04-23 NOTE — TOC Progression Note (Signed)
Transition of Care [facility]) - Progression Note    Patient Details  Name: Kristie Morris MRN: [id number] Date of Birth: [birth year]  [facility] CM/SW Contact  Ihor Gully, LCSW Phone Number: 04/23/2020, 1:04 PM  Clinical Narrative:    Damaris Schooner with [facility] representative. Auth remains pending. It has not yet been assigned to anyone. Per representative they are "slammed."   Expected Discharge Plan: Skilled Nursing Facility Barriers to Discharge: Insurance Authorization  Expected Discharge Plan and Services Expected Discharge Plan: [place] arrangements for the past 2 months: Single Family Home Expected Discharge Date: 04/23/20                                     Social Determinants of Health (SDOH) Interventions    Readmission Risk Interventions No flowsheet data found.

## 2020-04-23 NOTE — Plan of Care (Signed)

## 2020-04-23 NOTE — Progress Notes (Signed)
PT Cancellation Note  Patient Details Name: Kristie Morris MRN: [id number] DOB: 30-Dec-1924   Cancelled Treatment:    Reason Eval/Treat Not Completed: Patient declined, no reason specified. Pt sleeping upon arrival with supplemental O2 off and SpO2 87%. Therapist returned O2 nasal cannula and SpO2 returned to 93% within 1 minute. Pt declines therapy and returns to resting; RN notified. Will continue to follow acutely.   Talbot Grumbling PT, DPT 04/23/20, 12:15 PM [phone]

## 2020-04-23 NOTE — TOC Progression Note (Signed)
Transition of Care [facility]) - Progression Note    Patient Details  Name: Kristie Morris MRN: [id number] Date of Birth: 08/05/1925  Transition of Care [facility]) CM/SW Contact  Ihor Gully, LCSW Phone Number: 04/23/2020, 2:53 PM  Clinical Narrative:    TOC checked the [employer] online portal and learned that patient's authorization had been voided despite an earlier conversation with [facility] advising of pending status. TOC contacted [employer] and was advised that patient's humana was managed by the plan that the facility would have to start authorization.  Dana at [facility] advised to start authorization. Hinton Dyer agreeable to start authorization immediately.  Rich Fuchs, advised that patient has had both COVID vaccines. Ysidro Evert to send copy of card to [place] [facility].    Expected Discharge Plan: Skilled Nursing Facility Barriers to Discharge: Insurance Authorization  Expected Discharge Plan and Services Expected Discharge Plan: [place]       Living arrangements for the past 2 months: Single Family Home Expected Discharge Date: 04/23/20                                     Social Determinants of Health (SDOH) Interventions    Readmission Risk Interventions No flowsheet data found.

## 2020-04-23 NOTE — Progress Notes (Signed)
Patient Demographics:    Kristie Morris, is a 86 y.o. female, DOB - May 17, 1925, FBP:[id number]  Admit date - 04/17/2020   Admitting Physician Purva Vessell Denton Brick, MD  Outpatient Primary MD for the patient is [name], NP  LOS - 5  Chief Complaint  Patient presents with  . possible UTI        Subjective:    Kristie Morris today has difficulty swallowing, - Confusion and disorientation persist Discussed with Kristie Morris --Swallowing difficulties persist, patient did poorly poorly with swallow eval/MBS on 04/23/20  -Awaiting Insurance approval to go to SNF rehab  [place]--- severe dysphagia with aspiration with most consistencies, essentially wheelchair-bound status----- further conversations with family about realistic expectations and overall prognosis- --requesting palliative care to help with further conversations about advanced directives and kindly help further delineate goals of care and expectations    Assessment  & Plan :    Principal Problem:   Acute metabolic encephalopathy Active Problems:   Bacteremia due to Gram-positive bacteria   COPD (chronic obstructive pulmonary disease) ([place])   Personal history of stroke with current residual effects   Wheelchair bound   Asthma, chronic   Essential hypertension   Bacteremia  Brief Summary:- 86 y.o. female with medical history significant for diastolic CHF, COPD, asthma, hypertension, CVA with residual left hemiparesis admitted on 04/17/20 with altered mentation/disorientation in the setting of presumed UTI and GPC bacteremia altered mentation and disorientation in the setting of presumed UTI and GPC bacteremia -Awaiting insurance approval for transfer to SNF rehab  A/p 1)Acute Metabolic Encephalopathy-- due to dehydration, AKI and presumed UTI and GPC bacteremia -Patient does Not meet sepsis criteria --Anticipate improvement with  improvement of listed conditions -As per family patient is still not back to baseline from a cognitive standpoint  2)CONS bacteremia---??? Contaminants--- treated initially with IV Rocephin-no further antibiotics required for this particular indication -No fevers, no tachypnea, no tachycardia, no leukocytosis, lactic acid is not elevated--- patient does Not meet sepsis criteria  3) Pseudomonas-UTI--- continue IV cefepime started on 04/19/2020 last dose 04/24/2020  4)AKI----acute kidney injury due to dehydration and concomitant meloxicam use -- creatinine on admission= 1.55  , baseline creatinine = 1.1 (10/27/19)   -Creatinine is down to 1.1 ---, renally adjust medications, avoid nephrotoxic agents / dehydration  / hypotension  -.hydrate  IV and p.o.  5)Poor Venous Access--limiting ability to give IV fluids and IV antibiotics,-had  ultrasound assisted midline placement to the left arm  6) ambulatory dysfunction--- at baseline patient was able to stand and pivot but she gets around with a wheelchair she does not walk -PT eval appreciated, SNF recommended  -PT/OT evaluations performed. SNF Rehab recommended. SNF appropriate as the patient has received 3 days of hospital care (or the 3 day period has been waived by the patient's insurance company) and is felt to need rehab services to restore this patient to their prior level of function to achieve safe transition back to home care. This patient needs rehab services for at least 5 days per week and skilled nursing services daily to facilitate this transition. Rehab is being requested as the most appropriate d/c option for this patient and is NOT felt to be for custodial care as evidenced by previously able to stand, pivot  and help with transfers-- prior to admission (comment on the patient's prior level of function). -She was requiring assist of 1 with ADLs prior to admission   7)Prior history of stroke more than 5 years ago with residual left-sided  hemiparesis--- please see #6 above -Continue aspirin and crestor for secondary stroke prophylaxis - 8)Depression--- stable continue Cymbalta, and trazodone  9)Dysphagia--speech and swallow evaluation appreciated, recommends dysphagia 3 (Mech soft);Nectar-thick liquid  --Did very very poorly with swallow eval/MBS on 04/23/2020 please see full report  10) hypokalemia/Hypomagnesiemia---replace and recheck  11)Social/Ethics---  GOC--- severe dysphagia with aspiration with most consistencies, essentially wheelchair-bound status----- further conversations with family about realistic expectations and overall prognosis- --requesting palliative care to help with further conversations about advanced directives and kindly help further delineate goals of care and expectations --Remains full code at this time  Disposition/Need for in-Hospital Stay- patient unable to be discharged at this time due to --Awaiting Insurance approval to go to SNF rehab  Status is: Inpatient  Remains inpatient appropriate because:Awaiting Insurance approval to go to SNF rehab   Disposition: The patient is from: Home              Anticipated d/c is to: Home              Anticipated d/c date is: 2 days              Patient currently is medically stable to d/c. Barriers: Not Clinically Stable- - -Awaiting Insurance approval to go to SNF rehab  Procedures:- --Swallowing difficulties persist, patient did poorly poorly with swallow eval/MBS on 04/23/20  Code Status : Full  Family Communication: discussed with grandson and granddaughter  Consults  : Palliative care  DVT Prophylaxis  :   - Heparin - SCDs   Lab Results  Component Value Date   PLT 291 04/20/2020   Inpatient Medications  Scheduled Meds: . amLODipine  5 mg Oral Daily  . aspirin EC  81 mg Oral Daily  . chlorhexidine  15 mL Mouth Rinse BID  . heparin  5,000 Units Subcutaneous Q8H  . mouth rinse  15 mL Mouth Rinse q12n4p  . oxybutynin  10 mg Oral QHS   . pantoprazole  40 mg Oral Q1500  . rosuvastatin  5 mg Oral QPM  . sodium chloride flush  10-40 mL Intracatheter Q12H  . traZODone  50 mg Oral QHS   Continuous Infusions: . sodium chloride 40 mL/hr at 04/22/20 0908  . ceFEPime (MAXIPIME) IV 1 g (04/22/20 1957)   PRN Meds:.acetaminophen **OR** acetaminophen, albuterol, labetalol, ondansetron **OR** ondansetron (ZOFRAN) IV, polyethylene glycol, sodium chloride flush    Anti-infectives (From admission, onward)   Start     Dose/Rate Route Frequency Ordered Stop   04/19/20 2000  ceFEPIme (MAXIPIME) 1 g in sodium chloride 0.9 % 100 mL IVPB     Discontinue     1 g 200 mL/hr over 30 Minutes Intravenous Every 24 hours 04/19/20 1949 04/24/20 2359   04/18/20 1645  cefTRIAXone (ROCEPHIN) 2 g in sodium chloride 0.9 % 100 mL IVPB  Status:  Discontinued        2 g 200 mL/hr over 30 Minutes Intravenous Every 24 hours 04/18/20 1625 04/19/20 1928   04/17/20 1530  cefTRIAXone (ROCEPHIN) 1 g in sodium chloride 0.9 % 100 mL IVPB  Status:  Discontinued        1 g 200 mL/hr over 30 Minutes Intravenous Every 24 hours 04/17/20 1517 04/17/20 2212  Objective:   Vitals:   04/22/20 0503 04/22/20 1510 04/22/20 2101 04/23/20 0524  BP:  138/79 (!) 151/82 (!) 142/77  Pulse:  (!) 104 (!) 106 (!) 110  Resp:  17 20 20   Temp:  97.8 F (36.6 C) 98.4 F (36.9 C) 98.8 F (37.1 C)  TempSrc:  Axillary Oral Oral  SpO2:  92% 97% 97%  Weight: 74.9 kg   74.8 kg  Height:        Wt Readings from Last 3 Encounters:  04/23/20 74.8 kg  02/21/20 83.5 kg  02/21/20 83.5 kg    Intake/Output Summary (Last 24 hours) at 04/23/2020 1720 Last data filed at 04/23/2020 7425 Gross per 24 hour  Intake 660 ml  Output 1800 ml  Net -1140 ml   Physical Exam Gen:- Awake , less confused, less disoriented  HEENT:- [place].AT, No sclera icterus Neck-Supple Neck,No JVD,.  Lungs-diminished in bases, no wheezing  CV- S1, S2 normal, regular  Abd-  +ve B.Sounds, Abd Soft, No  tenderness, no CVA area tenderness    Extremity/Skin:- No  edema, pedal pulses present  Psych-affect is intermittently confused with intermittent disorientation neuro-residual left-sided hemiparesis, no new focal deficits, no tremors   Data Review:   Micro Results Recent Results (from the past 240 hour(s))  Urine culture     Status: Abnormal   Collection Time: 04/17/20  2:34 PM   Specimen: In/Out Cath Urine  Result Value Ref Range Status   Specimen Description   Final    IN/OUT CATH URINE Performed at [facility], [address], [place], [place] [address]    Special Requests   Final    NONE Performed at [facility], [address], [place], [place] [address]    Culture 30,000 COLONIES/mL PSEUDOMONAS AERUGINOSA (A)  Final   Report Status 04/20/2020 FINAL  Final   Organism ID, Bacteria PSEUDOMONAS AERUGINOSA (A)  Final      Susceptibility   Pseudomonas aeruginosa - MIC*    CEFTAZIDIME 2 SENSITIVE Sensitive     CIPROFLOXACIN <=0.25 SENSITIVE Sensitive     GENTAMICIN <=1 SENSITIVE Sensitive     IMIPENEM 1 SENSITIVE Sensitive     PIP/TAZO 8 SENSITIVE Sensitive     CEFEPIME 2 SENSITIVE Sensitive     * 30,000 COLONIES/mL PSEUDOMONAS AERUGINOSA  SARS Coronavirus 2 by RT PCR (hospital order, performed in [place] hospital lab) Nasopharyngeal Nasopharyngeal Swab     Status: None   Collection Time: 04/17/20  3:43 PM   Specimen: Nasopharyngeal Swab  Result Value Ref Range Status   SARS Coronavirus 2 NEGATIVE NEGATIVE Final    Comment: (NOTE) SARS-CoV-2 target nucleic acids are NOT DETECTED.  The SARS-CoV-2 RNA is generally detectable in upper and lower respiratory specimens during the acute phase of infection. The lowest concentration of SARS-CoV-2 viral copies this assay can detect is 250 copies / mL. A negative result does not preclude SARS-CoV-2 infection and should not be used as the sole basis for treatment or other patient management decisions.  A negative result may occur  with improper specimen collection / handling, submission of specimen other than nasopharyngeal swab, presence of viral mutation(s) within the areas targeted by this assay, and inadequate number of viral copies (<250 copies / mL). A negative result must be combined with clinical observations, patient history, and epidemiological information.  Fact Sheet for Patients:   StrictlyIdeas.no  Fact Sheet for Healthcare Providers: BankingDealers.co.za  This test is not yet approved or  cleared by the [place] FDA  and has been authorized for detection and/or diagnosis of SARS-CoV-2 by FDA under an Emergency Use Authorization (EUA).  This EUA will remain in effect (meaning this test can be used) for the duration of the COVID-19 declaration under Section 564(b)(1) of the Act, 21 U.S.C. section 360bbb-3(b)(1), unless the authorization is terminated or revoked sooner.  Performed at [facility], [address], [place], [place] [address]   Blood Culture (routine x 2)     Status: None   Collection Time: 04/17/20  4:19 PM   Specimen: Right Antecubital; Blood  Result Value Ref Range Status   Specimen Description RIGHT ANTECUBITAL  Final   Special Requests   Final    BOTTLES DRAWN AEROBIC AND ANAEROBIC Blood Culture adequate volume   Culture   Final    NO GROWTH 5 DAYS Performed at [facility], [address] [place], [place] [address]    Report Status 04/22/2020 FINAL  Final  Blood Culture (routine x 2)     Status: Abnormal   Collection Time: 04/17/20  4:24 PM   Specimen: Left Antecubital; Blood  Result Value Ref Range Status   Specimen Description   Final    LEFT ANTECUBITAL Performed at [facility], [address], [place], [place] [address]    Special Requests   Final    BOTTLES DRAWN AEROBIC AND ANAEROBIC Blood Culture adequate volume Performed at [facility], [address], [place], [place] [address]    Culture  Setup Time   Final     BOTH BOTTLES GRAM POSITIVE COCCI Gram Stain Report Called to,Read Back By and Verified With: Lowry Ram 04/18/20 @1619  BY JONEST  CRITICAL RESULT CALLED TO, READ BACK BY AND VERIFIED WITH: D,JONES RN @2257  04/18/20 EB    Culture (A)  Final    STAPHYLOCOCCUS EPIDERMIDIS THE SIGNIFICANCE OF ISOLATING THIS ORGANISM FROM A SINGLE SET OF BLOOD CULTURES WHEN MULTIPLE SETS ARE DRAWN IS UNCERTAIN. PLEASE NOTIFY THE MICROBIOLOGY DEPARTMENT WITHIN ONE WEEK IF SPECIATION AND SENSITIVITIES ARE REQUIRED. Performed at [facility], [place] [address], [place], [place] [address]    Report Status 04/20/2020 FINAL  Final  Blood Culture ID Panel (Reflexed)     Status: Abnormal   Collection Time: 04/17/20  4:24 PM  Result Value Ref Range Status   Enterococcus species NOT DETECTED NOT DETECTED Final   Listeria monocytogenes NOT DETECTED NOT DETECTED Final   Staphylococcus species DETECTED (A) NOT DETECTED Final    Comment: Methicillin (oxacillin) resistant coagulase negative staphylococcus. Possible blood culture contaminant (unless isolated from more than one blood culture draw or clinical case suggests pathogenicity). No antibiotic treatment is indicated for blood  culture contaminants. CRITICAL RESULT CALLED TO, READ BACK BY AND VERIFIED WITH: D,JONES RN @2257  04/18/20 EB    Staphylococcus aureus (BCID) NOT DETECTED NOT DETECTED Final   Methicillin resistance DETECTED (A) NOT DETECTED Final    Comment: CRITICAL RESULT CALLED TO, READ BACK BY AND VERIFIED WITH: D,JONES RN @2257  04/18/20 EB    Streptococcus species NOT DETECTED NOT DETECTED Final   Streptococcus agalactiae NOT DETECTED NOT DETECTED Final   Streptococcus pneumoniae NOT DETECTED NOT DETECTED Final   Streptococcus pyogenes NOT DETECTED NOT DETECTED Final   Acinetobacter baumannii NOT DETECTED NOT DETECTED Final   Enterobacteriaceae species NOT DETECTED NOT DETECTED Final   Enterobacter cloacae complex NOT DETECTED NOT DETECTED Final    Escherichia coli NOT DETECTED NOT DETECTED Final   Klebsiella oxytoca NOT DETECTED NOT DETECTED Final   Klebsiella pneumoniae NOT  DETECTED NOT DETECTED Final   Proteus species NOT DETECTED NOT DETECTED Final   Serratia marcescens NOT DETECTED NOT DETECTED Final   Haemophilus influenzae NOT DETECTED NOT DETECTED Final   Neisseria meningitidis NOT DETECTED NOT DETECTED Final   Pseudomonas aeruginosa NOT DETECTED NOT DETECTED Final   Candida albicans NOT DETECTED NOT DETECTED Final   Candida glabrata NOT DETECTED NOT DETECTED Final   Candida krusei NOT DETECTED NOT DETECTED Final   Candida parapsilosis NOT DETECTED NOT DETECTED Final   Candida tropicalis NOT DETECTED NOT DETECTED Final    Comment: Performed at [facility], [place] [address], [place], [place] [address]    Radiology Reports DG Chest 1 View  Result Date: 04/17/2020 CLINICAL DATA:  Chest pain, altered mental status EXAM: CHEST  1 VIEW COMPARISON:  02/21/2020 FINDINGS: The generalized lung apices, slightly limiting evaluation. The lungs are symmetrically expanded and are clear. No definite pneumothorax. No pleural effusion. The cardiomediastinal silhouette is unremarkable. Pulmonary vascularity is normal. Surgical clips are noted within the axilla bilaterally. No acute bone abnormality IMPRESSION: No active disease. Electronically Signed   By: Fidela Salisbury MD   On: 04/17/2020 15:11   CT Head Wo Contrast  Result Date: 04/17/2020 CLINICAL DATA:  Delirium EXAM: CT HEAD WITHOUT CONTRAST TECHNIQUE: Contiguous axial images were obtained from the base of the skull through the vertex without intravenous contrast. COMPARISON:  Feb 21, 2020 FINDINGS: Brain: No evidence of acute territorial infarction, hemorrhage, hydrocephalus,extra-axial collection or mass lesion/mass effect. There is dilatation the ventricles and sulci consistent with age-related atrophy. Low-attenuation changes in the deep white matter consistent with small vessel  ischemia. Prior lacunar infarct seen within the right basal ganglia. Vascular: No hyperdense vessel or unexpected calcification. Skull: The skull is intact. No fracture or focal lesion identified. Sinuses/Orbits: The visualized paranasal sinuses and mastoid air cells are clear. The orbits and globes intact. Other: None IMPRESSION: No acute intracranial abnormality. Findings consistent with age related atrophy and chronic small vessel ischemia Electronically Signed   By: Prudencio Pair M.D.   On: 04/17/2020 17:00   DG Swallowing Func-Speech Pathology  Result Date: 04/23/2020 Objective Swallowing Evaluation: Type of Study: MBS-Modified Barium Swallow Study  Patient Details Name: BERLINE SEMRAD MRN: [id number] Date of Birth: 1925/08/12 Today's Date: 04/23/2020 Time: SLP Start Time (ACUTE ONLY): 1353 -SLP Stop Time (ACUTE ONLY): 1418 SLP Time Calculation (min) (ACUTE ONLY): 25 min Past Medical History: Past Medical History: Diagnosis Date . Anemia 02/02/2014 . Anxiety  . Arthritis   knee . Asthma  . CAD (coronary artery disease) cardiologist-  dr Bronson Ing  ([facility] in [place])  2003  Cath with nonobstructive CAD . Chest pain 08/14/2017 . Chronic headache  . Chronic lower back pain  . COPD (chronic obstructive pulmonary disease) ([place])  . Diastolic CHF, chronic ([place])  . Duodenal diverticulum  . Edema of left lower extremity  . Fibromyalgia  . Full dentures  . Gait disorder   uses walker . GERD (gastroesophageal reflux disease)  . Headache disorder 01/17/2015 . Heart murmur  . History of breast cancer  . History of Clostridium difficile  . History of colitis   2001 . History of CVA with residual deficit   06/ 2015  residual left hemiparesis . History of esophageal dilatation  . History of falling   last fall 06-25-2015 . History of hiatal hernia  . History of small bowel obstruction   04-25-2015  resolved without surgical intervention . Lesion of bladder  . Low  vision, one eye   right eye . Macular degeneration of  both eyes   right eye now low vision/  left eye getting injections currently . Malignant hypertension  . OAB (overactive bladder)  . Protein-calorie malnutrition, severe ([place]) 01/10/2015 . SBO (small bowel obstruction) ([place]) 04/22/2015 . Schatzki's ring  . Sciatica of right side 05/29/2015 . Syncope 08/14/2017 . Urge and stress incontinence  . Varicose veins  . Venous insufficiency, peripheral  . Vertigo  Past Surgical History: Past Surgical History: Procedure Laterality Date . [place]  from colon . ABDOMINAL HYSTERECTOMY  1954  w/ bilateral salpingoophorectomy . ANTERIOR CERVICAL DECOMP/DISCECTOMY FUSION  1971  "used bone off of my right hip" . BREAST LUMPECTOMY Left 2006 . CARDIAC CATHETERIZATION  11-29-2001  dr Tressia Miners turner  Non-obstructive CAD/  20% pLAD,  30% pD1, 50% mRCA/  normal LVF . CATARACT EXTRACTION W/ INTRAOCULAR LENS  IMPLANT, BILATERAL  right 2008//  left 2015 . CYSTOSCOPY WITH HYDRODISTENSION AND BIOPSY N/A 07/23/2015  Procedure: CYSTOSCOPY;HYDRODISTENSION;  Surgeon: Irine Seal, MD;  Location: [facility];  Service: Urology;  Laterality: N/A; . DILATION AND CURETTAGE OF UTERUS  1953   miscarriage . [employer] . KNEE ARTHROSCOPY Right 2005 . MASTECTOMY, PARTIAL Right 1999 . OVARIAN CYST REMOVAL  1946 . PATELLA FRACTURE SURGERY Left 2008 . TRANSTHORACIC ECHOCARDIOGRAM  01-09-2015  mild LVH,  ef 58-52%, grade I diastolic dysfunction/  mild MR/  trivial pericardial effusion was identified HPI: Dustee H. Barrientes is a 86 y.o. female with medical history significant for diastolic CHF, COPD, asthma, hypertension, CVA with residual left hemiparesis admitted on 04/17/20 with altered mentation/disorientation in the setting of presumed UTI and GPC bacteremia altered mentation and disorientation in the setting of presumed UTI and GPC bacteremia.Pt was placed on D3 and NTL on 7/16; MBS ordered to objectively assess the swallowing function.  No data recorded Assessment  / Plan / Recommendation CHL IP CLINICAL IMPRESSIONS 04/23/2020 Clinical Impression Pt presents with severe oropharyngeal dysphagia compounded by altered mental status; swallowing is characterized by poor awareness of bolus, severe anterior spillage, poor bolus cohesion, prolonged AP transit, lingual pumping, premature spillage pooling into the pyriforms, delayed swallowing trigger, penetration of all liquids (thin, NTL & HTL), and trace to mod SILENT aspiration (with thin, NTL & HTL) (this was sensed X2 out of multiple episodes of aspiration). Note decreased pharyngeal squeeze and decreased laryngeal vestibule closure. At this time, Pt is cognitively unable to consistently participate in strategies however she naturally remains in the position of a chin tuck which was effective in decreasing/eliminating aspiration with tsp sips of HTL. No penetration/aspiration was noted with puree or solid textures however solid texture trials were limited secondary to fatigue. Pt is at VERY HIGH risk of aspiration as trace-mod aspiration was noted with all liquid textures however least restrictive diet is D2/fine chop and HTL (administered with tsp); recommend precautions: small bites/sips, Pt should be seated upright, chin tuck (natural positioning) is ok and recommended for swallowing. Also note Pt is at risk for inadequate nutrition and dehydration. Pt is a good candidate for free water protocol (oral care in between meals followed by ice chips or WATER by teaspoon). Recommend consider repeat MBS after Pt's mentation has improved, subjective upgrade will be challenging secondary to multiple episodes of silent aspiration. ST will continue to follow acutely SLP Visit Diagnosis Dysphagia, unspecified (R13.10) Attention and concentration deficit following -- Frontal lobe and executive function deficit following -- Impact on safety  and function Mild aspiration risk;Moderate aspiration risk   CHL IP TREATMENT RECOMMENDATION 04/23/2020  Treatment Recommendations Therapy as outlined in treatment plan below   Prognosis 04/23/2020 Prognosis for Safe Diet Advancement Fair Barriers to Reach Goals Cognitive deficits;Severity of deficits Barriers/Prognosis Comment -- CHL IP DIET RECOMMENDATION 04/23/2020 SLP Diet Recommendations Dysphagia 2 (Fine chop) solids;Honey thick liquids Liquid Administration via Spoon Medication Administration Whole meds with puree Compensations Minimize environmental distractions;Slow rate;Small sips/bites;Monitor for anterior loss;Multiple dry swallows after each bite/sip;Clear throat after each swallow;Clear throat intermittently Postural Changes Remain semi-upright after after feeds/meals (Comment);Seated upright at 90 degrees   CHL IP OTHER RECOMMENDATIONS 04/23/2020 Recommended Consults -- Oral Care Recommendations Oral care BID Other Recommendations Order thickener from pharmacy   CHL IP FOLLOW UP RECOMMENDATIONS 04/23/2020 Follow up Recommendations 24 hour supervision/assistance;Skilled Nursing facility   [facility] IP FREQUENCY AND DURATION 04/23/2020 Speech Therapy Frequency (ACUTE ONLY) min 1 x/week Treatment Duration 1 week      CHL IP ORAL PHASE 04/23/2020 Oral Phase Impaired Oral - Pudding Teaspoon -- Oral - Pudding Cup -- Oral - Honey Teaspoon Left anterior bolus loss;Right anterior bolus loss;Impaired mastication;Weak lingual manipulation;Lingual pumping;Reduced posterior propulsion;Holding of bolus;Lingual/palatal residue;Piecemeal swallowing;Delayed oral transit;Decreased bolus cohesion;Premature spillage Oral - Honey Cup Left anterior bolus loss;Right anterior bolus loss;Impaired mastication;Weak lingual manipulation;Lingual pumping;Reduced posterior propulsion;Holding of bolus;Lingual/palatal residue;Piecemeal swallowing;Delayed oral transit;Decreased bolus cohesion;Premature spillage Oral - Nectar Teaspoon Left anterior bolus loss;Right anterior bolus loss;Impaired mastication;Weak lingual manipulation;Lingual  pumping;Reduced posterior propulsion;Holding of bolus;Lingual/palatal residue;Piecemeal swallowing;Delayed oral transit;Decreased bolus cohesion;Premature spillage Oral - Nectar Cup Left anterior bolus loss;Right anterior bolus loss;Impaired mastication;Weak lingual manipulation;Lingual pumping;Reduced posterior propulsion;Holding of bolus;Lingual/palatal residue;Piecemeal swallowing;Delayed oral transit;Decreased bolus cohesion;Premature spillage Oral - Nectar Straw Left anterior bolus loss;Right anterior bolus loss;Impaired mastication;Weak lingual manipulation;Lingual pumping;Reduced posterior propulsion;Holding of bolus;Lingual/palatal residue;Piecemeal swallowing;Delayed oral transit;Decreased bolus cohesion;Premature spillage Oral - Thin Teaspoon Left anterior bolus loss;Right anterior bolus loss;Impaired mastication;Weak lingual manipulation;Lingual pumping;Reduced posterior propulsion;Holding of bolus;Lingual/palatal residue;Piecemeal swallowing;Delayed oral transit;Decreased bolus cohesion;Premature spillage Oral - Thin Cup Left anterior bolus loss;Right anterior bolus loss;Impaired mastication;Weak lingual manipulation;Lingual pumping;Reduced posterior propulsion;Holding of bolus;Lingual/palatal residue;Piecemeal swallowing;Delayed oral transit;Decreased bolus cohesion;Premature spillage Oral - Thin Straw -- Oral - Puree Left anterior bolus loss;Right anterior bolus loss;Impaired mastication;Weak lingual manipulation;Lingual pumping;Reduced posterior propulsion;Holding of bolus;Lingual/palatal residue;Piecemeal swallowing;Delayed oral transit;Decreased bolus cohesion;Premature spillage Oral - Mech Soft -- Oral - Regular Left anterior bolus loss;Right anterior bolus loss;Impaired mastication;Weak lingual manipulation;Lingual pumping;Reduced posterior propulsion;Holding of bolus;Lingual/palatal residue;Piecemeal swallowing;Delayed oral transit;Decreased bolus cohesion;Premature spillage Oral -  Multi-Consistency -- Oral - Pill -- Oral Phase - Comment --  CHL IP PHARYNGEAL PHASE 04/23/2020 Pharyngeal Phase Impaired Pharyngeal- Pudding Teaspoon -- Pharyngeal -- Pharyngeal- Pudding Cup -- Pharyngeal -- Pharyngeal- Honey Teaspoon Delayed swallow initiation-vallecula;Delayed swallow initiation-pyriform sinuses;Reduced pharyngeal peristalsis;Reduced airway/laryngeal closure;Penetration/Aspiration during swallow;Penetration/Apiration after swallow;Moderate aspiration;Pharyngeal residue - valleculae;Pharyngeal residue - pyriform;Lateral channel residue Pharyngeal Material enters airway, passes BELOW cords without attempt by patient to eject out (silent aspiration) Pharyngeal- Honey Cup Delayed swallow initiation-vallecula;Delayed swallow initiation-pyriform sinuses;Reduced pharyngeal peristalsis;Reduced airway/laryngeal closure;Penetration/Aspiration during swallow;Penetration/Apiration after swallow;Moderate aspiration;Pharyngeal residue - valleculae;Pharyngeal residue - pyriform;Lateral channel residue Pharyngeal Material enters airway, passes BELOW cords without attempt by patient to eject out (silent aspiration) Pharyngeal- Nectar Teaspoon -- Pharyngeal -- Pharyngeal- Nectar Cup Delayed swallow initiation-vallecula;Delayed swallow initiation-pyriform sinuses;Reduced pharyngeal peristalsis;Reduced airway/laryngeal closure;Penetration/Aspiration during swallow;Penetration/Apiration after swallow;Moderate aspiration;Pharyngeal residue - valleculae;Pharyngeal residue - pyriform;Lateral channel residue Pharyngeal Material enters airway, passes BELOW cords without attempt by patient to eject out (silent aspiration) Pharyngeal- Nectar Straw Delayed swallow initiation-vallecula;Delayed swallow  initiation-pyriform sinuses;Reduced pharyngeal peristalsis;Reduced airway/laryngeal closure;Penetration/Aspiration during swallow;Penetration/Apiration after swallow;Moderate aspiration;Pharyngeal residue - valleculae;Pharyngeal  residue - pyriform;Lateral channel residue Pharyngeal Material enters airway, passes BELOW cords without attempt by patient to eject out (silent aspiration) Pharyngeal- Thin Teaspoon Delayed swallow initiation-vallecula;Delayed swallow initiation-pyriform sinuses;Reduced pharyngeal peristalsis;Reduced airway/laryngeal closure;Penetration/Aspiration during swallow;Penetration/Apiration after swallow;Moderate aspiration;Pharyngeal residue - valleculae;Pharyngeal residue - pyriform;Lateral channel residue Pharyngeal Material enters airway, passes BELOW cords without attempt by patient to eject out (silent aspiration) Pharyngeal- Thin Cup Delayed swallow initiation-vallecula;Delayed swallow initiation-pyriform sinuses;Reduced pharyngeal peristalsis;Reduced airway/laryngeal closure;Penetration/Aspiration during swallow;Penetration/Apiration after swallow;Moderate aspiration;Pharyngeal residue - valleculae;Pharyngeal residue - pyriform;Lateral channel residue Pharyngeal Material enters airway, passes BELOW cords without attempt by patient to eject out (silent aspiration) Pharyngeal- Thin Straw NT Pharyngeal -- Pharyngeal- Puree Delayed swallow initiation-vallecula Pharyngeal -- Pharyngeal- Mechanical Soft -- Pharyngeal -- Pharyngeal- Regular Delayed swallow initiation-vallecula Pharyngeal -- Pharyngeal- Multi-consistency -- Pharyngeal -- Pharyngeal- Pill -- Pharyngeal -- Pharyngeal Comment pronounce cricopharyngeus  No flowsheet data found. Amelia H. Roddie Mc, CCC-SLP Speech Language Pathologist Wende Bushy 04/23/2020, 3:34 PM              [place] EKG SITE RITE  Result Date: 04/18/2020 If Site Rite image not attached, placement could not be confirmed due to current cardiac rhythm.    CBC Recent Labs  Lab 04/17/20 1619 04/20/20 0821  WBC 9.2 6.3  HGB 10.5* 9.6*  HCT 34.5* 31.3*  PLT 316 291  MCV 86.5 85.8  MCH 26.3 26.3  MCHC 30.4 30.7  RDW 15.1 14.9  LYMPHSABS 0.9  --   MONOABS 0.7  --   EOSABS  0.7*  --   BASOSABS 0.1  --     Chemistries  Recent Labs  Lab 04/17/20 1619 04/20/20 0821 04/21/20 0625 04/22/20 0701 04/23/20 0436  NA 134* 140 140 144  --   K 3.8 3.0* 2.6* 3.5  --   CL 96* 104 104 108  --   CO2 29 28 28 28   --   GLUCOSE 112* 102* 100* 106*  --   BUN 34* 19 18 17   --   CREATININE 1.55* 1.28* 1.21* 1.18*  --   CALCIUM 12.7* 12.3* 12.3* 12.5*  --   MG 1.9  --  1.5*  --  2.0  AST 20  --   --   --   --   ALT 13  --   --   --   --   ALKPHOS 100  --   --   --   --   BILITOT 0.5  --   --   --   --    ------------------------------------------------------------------------------------------------------------------ No results for input(s): CHOL, HDL, LDLCALC, TRIG, CHOLHDL, LDLDIRECT in the last 72 hours.  No results found for: HGBA1C ------------------------------------------------------------------------------------------------------------------ No results for input(s): TSH, T4TOTAL, T3FREE, THYROIDAB in the last 72 hours.  Invalid input(s): FREET3 ------------------------------------------------------------------------------------------------------------------ No results for input(s): VITAMINB12, FOLATE, FERRITIN, TIBC, IRON, RETICCTPCT in the last 72 hours.  Coagulation profile Recent Labs  Lab 04/17/20 1619  INR 1.0    No results for input(s): DDIMER in the last 72 hours.  Cardiac Enzymes No results for input(s): CKMB, TROPONINI, MYOGLOBIN in the last 168 hours.  Invalid input(s): CK ------------------------------------------------------------------------------------------------------------------    Component Value Date/Time   BNP 21.0 02/21/2020 1614   Lindie Roberson M.D on 04/23/2020 at 5:20 PM  Go to [email or web address] - for contact info  [facility] - Office  [phone]

## 2020-04-24 ENCOUNTER — Inpatient Hospital Stay (HOSPITAL_COMMUNITY): Payer: Medicare HMO

## 2020-04-24 DIAGNOSIS — R7881 Bacteremia: Secondary | ICD-10-CM

## 2020-04-24 DIAGNOSIS — I10 Essential (primary) hypertension: Secondary | ICD-10-CM

## 2020-04-24 DIAGNOSIS — I5189 Other ill-defined heart diseases: Secondary | ICD-10-CM

## 2020-04-24 DIAGNOSIS — R410 Disorientation, unspecified: Secondary | ICD-10-CM

## 2020-04-24 LAB — COMPREHENSIVE METABOLIC PANEL
ALT: 27 U/L (ref 0–44)
AST: 32 U/L (ref 15–41)
Albumin: 3.4 g/dL — ABNORMAL LOW (ref 3.5–5.0)
Alkaline Phosphatase: 104 U/L (ref 38–126)
Anion gap: 11 (ref 5–15)
BUN: 30 mg/dL — ABNORMAL HIGH (ref 8–23)
CO2: 29 mmol/L (ref 22–32)
Calcium: 12.8 mg/dL — ABNORMAL HIGH (ref 8.9–10.3)
Chloride: 106 mmol/L (ref 98–111)
Creatinine, Ser: 1.46 mg/dL — ABNORMAL HIGH (ref 0.44–1.00)
GFR calc Af Amer: 36 mL/min — ABNORMAL LOW (ref >60)
GFR calc non Af Amer: 31 mL/min — ABNORMAL LOW (ref >60)
Glucose, Bld: 113 mg/dL — ABNORMAL HIGH (ref 70–99)
Potassium: 2.8 mmol/L — ABNORMAL LOW (ref 3.5–5.1)
Sodium: 146 mmol/L — ABNORMAL HIGH (ref 135–145)
Total Bilirubin: 0.5 mg/dL (ref 0.3–1.2)
Total Protein: 6.4 g/dL — ABNORMAL LOW (ref 6.5–8.1)

## 2020-04-24 LAB — MAGNESIUM: Magnesium: 1.9 mg/dL (ref 1.7–2.4)

## 2020-04-24 MED ORDER — IOHEXOL 9 MG/ML PO SOLN
ORAL | Status: AC
  Filled 2020-04-24: qty 1000

## 2020-04-24 MED ORDER — ZOLEDRONIC ACID 4 MG/5ML IV CONC
4.0000 mg | Freq: Once | INTRAVENOUS | Status: AC
  Administered 2020-04-24: 4 mg via INTRAVENOUS
  Filled 2020-04-24: qty 5

## 2020-04-24 MED ORDER — POTASSIUM CHLORIDE 10 MEQ/100ML IV SOLN
10.0000 meq | INTRAVENOUS | Status: AC
  Administered 2020-04-24 (×3): 10 meq via INTRAVENOUS
  Filled 2020-04-24: qty 100

## 2020-04-24 MED ORDER — POTASSIUM CHLORIDE IN NACL 20-0.45 MEQ/L-% IV SOLN: INTRAVENOUS | Status: DC

## 2020-04-24 MED ORDER — CALCITONIN (SALMON) 200 UNIT/ML IJ SOLN
4.0000 [IU]/kg | Freq: Two times a day (BID) | INTRAMUSCULAR | Status: DC
  Administered 2020-04-24 – 2020-04-25 (×3): 290 [IU] via SUBCUTANEOUS
  Filled 2020-04-24 (×4): qty 1.45

## 2020-04-24 MED ORDER — IOHEXOL 300 MG/ML  SOLN
75.0000 mL | Freq: Once | INTRAMUSCULAR | Status: AC | PRN
  Administered 2020-04-24: 75 mL via INTRAVENOUS

## 2020-04-24 NOTE — Progress Notes (Addendum)
Patient Demographics:    Kristie Morris, is a 86 y.o. female, DOB - [birth year], KZL:[id number]  Admit date - 04/17/2020   Admitting Physician [name], MD  Outpatient Primary MD for the patient is [name], NP  LOS - 6  Chief Complaint  Patient presents with  . possible UTI        Subjective:    [name] remains confused and not eating or drinking well.  Her labs today reveal a new AKI and hypokalemia.  Her hypercalcemia persists.    GOC--- severe dysphagia with aspiration with most consistencies, essentially wheelchair-bound status----- further conversations with family about realistic expectations and overall prognosis- --requesting palliative care to help with further conversations about advanced directives and kindly help further delineate goals of care and expectations   Assessment  & Plan :    Principal Problem:   Acute metabolic encephalopathy Active Problems:   Diastolic dysfunction   Asthma, chronic   COPD (chronic obstructive pulmonary disease) ([place])   Essential hypertension   Personal history of stroke with current residual effects   Wheelchair bound   Bacteremia due to Gram-positive bacteria   Bacteremia   Hypercalcemia   Confusion  Brief Summary: 86 y.o. female with medical history significant for diastolic CHF, COPD, asthma, hypertension, CVA with residual left hemiparesis admitted on 04/17/20 with altered mentation/disorientation in the setting of presumed UTI and GPC bacteremia altered mentation and disorientation in the setting of presumed UTI and GPC bacteremia -Awaiting insurance approval for transfer to SNF rehab  A/p 1)Acute Metabolic Encephalopathy--likely due to hypercalcemia, ordered treatments today to bring down calcium levels. Pt also has AKI and presumed UTI.   She is not eating and drinking well and prognosis remains guarded.  --Anticipate improvement  with improvement of listed conditions --As per family patient is still not back to baseline from a cognitive standpoint  Hypercalcemia - I have ordered zoledronic acid x 1 dose, plus calcitonin and IV fluids, recheck in AM.  Unfortunately she likely has an occult malignancy.  Palliative care discussion with family today about wishes.  Check PTH.    Hypernatremia - added free water, recheck in AM.   2)CONS bacteremia--contaminants--- antibiotics discontinued-No fevers, no tachypnea, no tachycardia, no leukocytosis, lactic acid is not elevated---patient does Not meet sepsis criteria  3) Pseudomonas-UTI--- continue IV cefepime started on 04/19/2020 last dose 04/24/2020. TREATED.   4)AKI----acute kidney injury due to dehydration and concomitant meloxicam use.  Had improved with hydration but since not eating and drinking she has developed a recurrence of AKI. IV fluids reordered 7/21.   -- creatinine on admission= 1.55,  baseline creatinine = 1.1 (10/27/19)   -Creatinine is down to 1.1 ---renally adjust medications, avoid nephrotoxic agents / dehydration  / hypotension   5)Poor Venous Access--limiting ability to give IV fluids and IV antibiotics-had ultrasound assisted midline placement to the left arm  6) ambulatory dysfunction--- at baseline patient was able to stand and pivot but she gets around with a wheelchair she does not walk -PT eval appreciated, SNF recommended  -PT/OT evaluations performed. SNF Rehab recommended. SNF appropriate as the patient has received 3 days of hospital care (or the 3 day period has been waived by the patient's insurance company) and is felt to need  rehab services to restore this patient to their prior level of function to achieve safe transition back to home care. This patient needs rehab services for at least 5 days per week and skilled nursing services daily to facilitate this transition. Rehab is being requested as the most appropriate d/c option for this patient and  is NOT felt to be for custodial care as evidenced by previously able to stand, pivot and help with transfers-- prior to admission (comment on the patient's prior level of function). -She was requiring assist of 1 with ADLs prior to admission  7)Prior history of stroke more than 5 years ago with residual left-sided hemiparesis--- please see #6 above -Continue aspirin and crestor for secondary stroke prophylaxis - 8)Depression--- stable continue Cymbalta, and trazodone  9)Dysphagia--speech and swallow evaluation appreciated, recommends dysphagia 3 (Mech soft);Nectar-thick liquid  --Did very very poorly with swallow eval/MBS on 04/23/2020 please see full report  10) hypokalemia/Hypomagnesiemia---replace and recheck  11)Social/Ethics---  GOC--- severe dysphagia with aspiration with most consistencies, essentially wheelchair-bound status----- further conversations with family about realistic expectations and overall prognosis- --requesting palliative care to help with further conversations about advanced directives and kindly help further delineate goals of care and expectations --Remains full code at this time  Disposition/Need for in-Hospital Stay- patient unable to be discharged at this time due to --Awaiting Insurance approval to go to SNF rehab  Status is: Inpatient  Remains inpatient appropriate because:Awaiting Insurance approval to go to SNF rehab, treating and working up hypercalcemia with IV zaledronic acid and calcitonin.    Disposition: The patient is from: Home              Anticipated d/c is to: SNF              Anticipated d/c date is: 2 days              Patient currently is not medically stable to d/c. Barriers: Not Clinically Stable- - treating clinically significant hypercalemia with mental status changes, also treating hypokalemia and AKI   Procedures:- --Swallowing difficulties persist, patient did poorly poorly with swallow eval/MBS on 04/23/20  Code Status :  Full  Family Communication: discussed with grandson and granddaughter  Consults  : Palliative care  DVT Prophylaxis  :   - Heparin - SCDs   Lab Results  Component Value Date   PLT 291 04/20/2020   Inpatient Medications  Scheduled Meds: . amLODipine  5 mg Oral Daily  . aspirin EC  81 mg Oral Daily  . calcitonin  4 Units/kg Subcutaneous BID  . chlorhexidine  15 mL Mouth Rinse BID  . heparin  5,000 Units Subcutaneous Q8H  . mouth rinse  15 mL Mouth Rinse q12n4p  . oxybutynin  10 mg Oral QHS  . pantoprazole  40 mg Oral Q1500  . rosuvastatin  5 mg Oral QPM  . sodium chloride flush  10-40 mL Intracatheter Q12H  . traZODone  50 mg Oral QHS   Continuous Infusions: . 0.45 % NaCl with KCl 20 mEq / L 70 mL/hr at 04/24/20 1026  . ceFEPime (MAXIPIME) IV Stopped (04/23/20 2120)  . potassium chloride 10 mEq (04/24/20 1153)   PRN Meds:.acetaminophen **OR** acetaminophen, albuterol, labetalol, ondansetron **OR** ondansetron (ZOFRAN) IV, polyethylene glycol, sodium chloride flush    Anti-infectives (From admission, onward)   Start     Dose/Rate Route Frequency Ordered Stop   04/19/20 2000  ceFEPIme (MAXIPIME) 1 g in sodium chloride 0.9 % 100 mL IVPB  Discontinue     1 g 200 mL/hr over 30 Minutes Intravenous Every 24 hours 04/19/20 1949 04/24/20 2359   04/18/20 1645  cefTRIAXone (ROCEPHIN) 2 g in sodium chloride 0.9 % 100 mL IVPB  Status:  Discontinued        2 g 200 mL/hr over 30 Minutes Intravenous Every 24 hours 04/18/20 1625 04/19/20 1928   04/17/20 1530  cefTRIAXone (ROCEPHIN) 1 g in sodium chloride 0.9 % 100 mL IVPB  Status:  Discontinued        1 g 200 mL/hr over 30 Minutes Intravenous Every 24 hours 04/17/20 1517 04/17/20 2212       Objective:   Vitals:   04/23/20 0524 04/23/20 1400 04/23/20 2048 04/24/20 0514  BP: (!) 142/77 136/69 (!) 154/80 (!) 159/90  Pulse: (!) 110 100 89 88  Resp: 20 20 20 20   Temp: 98.8 F (37.1 C) 98.5 F (36.9 C) 98.2 F (36.8 C) 98.1 F  (36.7 C)  TempSrc: Oral     SpO2: 97% 98% 99% 96%  Weight: 74.8 kg   72.3 kg  Height:        Wt Readings from Last 3 Encounters:  04/24/20 72.3 kg  02/21/20 83.5 kg  02/21/20 83.5 kg    Intake/Output Summary (Last 24 hours) at 04/24/2020 1233 Last data filed at 04/24/2020 0900 Gross per 24 hour  Intake 829.82 ml  Output 1300 ml  Net -470.18 ml   Physical Exam Gen:- Awake , less confused, less disoriented  HEENT:- [place].AT, No sclera icterus Neck-Supple Neck,No JVD,.  Lungs-diminished in bases, no wheezing  CV- S1, S2 normal, regular  Abd-  +ve B.Sounds, Abd Soft, No tenderness, no CVA area tenderness    Extremity/Skin:- No  edema, pedal pulses present  Psych-affect is intermittently confused with intermittent disorientation neuro-residual left-sided hemiparesis, no new focal deficits, no tremors   Data Review:   Micro Results Recent Results (from the past 240 hour(s))  Urine culture     Status: Abnormal   Collection Time: 04/17/20  2:34 PM   Specimen: In/Out Cath Urine  Result Value Ref Range Status   Specimen Description   Final    IN/OUT CATH URINE Performed at [facility], [address], [place], [place] [address]    Special Requests   Final    NONE Performed at [facility], [address] [place], [place] [address]    Culture 30,000 COLONIES/mL PSEUDOMONAS AERUGINOSA (A)  Final   Report Status 04/20/2020 FINAL  Final   Organism ID, Bacteria PSEUDOMONAS AERUGINOSA (A)  Final      Susceptibility   Pseudomonas aeruginosa - MIC*    CEFTAZIDIME 2 SENSITIVE Sensitive     CIPROFLOXACIN <=0.25 SENSITIVE Sensitive     GENTAMICIN <=1 SENSITIVE Sensitive     IMIPENEM 1 SENSITIVE Sensitive     PIP/TAZO 8 SENSITIVE Sensitive     CEFEPIME 2 SENSITIVE Sensitive     * 30,000 COLONIES/mL PSEUDOMONAS AERUGINOSA  SARS Coronavirus 2 by RT PCR (hospital order, performed in [facility] hospital lab) Nasopharyngeal Nasopharyngeal Swab     Status: None   Collection Time:  04/17/20  3:43 PM   Specimen: Nasopharyngeal Swab  Result Value Ref Range Status   SARS Coronavirus 2 NEGATIVE NEGATIVE Final    Comment: (NOTE) SARS-CoV-2 target nucleic acids are NOT DETECTED.  The SARS-CoV-2 RNA is generally detectable in upper and lower respiratory specimens during the acute phase of infection. The lowest concentration of SARS-CoV-2 viral copies this assay can  detect is 250 copies / mL. A negative result does not preclude SARS-CoV-2 infection and should not be used as the sole basis for treatment or other patient management decisions.  A negative result may occur with improper specimen collection / handling, submission of specimen other than nasopharyngeal swab, presence of viral mutation(s) within the areas targeted by this assay, and inadequate number of viral copies (<250 copies / mL). A negative result must be combined with clinical observations, patient history, and epidemiological information.  Fact Sheet for Patients:   StrictlyIdeas.no  Fact Sheet for Healthcare Providers: BankingDealers.co.za  This test is not yet approved or  cleared by the [place] FDA and has been authorized for detection and/or diagnosis of SARS-CoV-2 by FDA under an Emergency Use Authorization (EUA).  This EUA will remain in effect (meaning this test can be used) for the duration of the COVID-19 declaration under Section 564(b)(1) of the Act, 21 U.S.C. section 360bbb-3(b)(1), unless the authorization is terminated or revoked sooner.  Performed at [facility], [address], [place], [place] [address]   Blood Culture (routine x 2)     Status: None   Collection Time: 04/17/20  4:19 PM   Specimen: Right Antecubital; Blood  Result Value Ref Range Status   Specimen Description RIGHT ANTECUBITAL  Final   Special Requests   Final    BOTTLES DRAWN AEROBIC AND ANAEROBIC Blood Culture adequate volume   Culture   Final    NO GROWTH  5 DAYS Performed at [facility], [address], [place], [place] [address]    Report Status 04/22/2020 FINAL  Final  Blood Culture (routine x 2)     Status: Abnormal   Collection Time: 04/17/20  4:24 PM   Specimen: Left Antecubital; Blood  Result Value Ref Range Status   Specimen Description   Final    LEFT ANTECUBITAL Performed at [facility], [address], [place], [place] [address]    Special Requests   Final    BOTTLES DRAWN AEROBIC AND ANAEROBIC Blood Culture adequate volume Performed at [facility], [address], [place], [place] [address]    Culture  Setup Time   Final    BOTH BOTTLES GRAM POSITIVE COCCI Gram Stain Report Called to,Read Back By and Verified With: Lowry Ram 04/18/20 @1619  BY JONEST  CRITICAL RESULT CALLED TO, READ BACK BY AND VERIFIED WITH: D,JONES RN @2257  04/18/20 EB    Culture (A)  Final    STAPHYLOCOCCUS EPIDERMIDIS THE SIGNIFICANCE OF ISOLATING THIS ORGANISM FROM A SINGLE SET OF BLOOD CULTURES WHEN MULTIPLE SETS ARE DRAWN IS UNCERTAIN. PLEASE NOTIFY THE MICROBIOLOGY DEPARTMENT WITHIN ONE WEEK IF SPECIATION AND SENSITIVITIES ARE REQUIRED. Performed at [facility], [place] [address], [place], [place] [address]    Report Status 04/20/2020 FINAL  Final  Blood Culture ID Panel (Reflexed)     Status: Abnormal   Collection Time: 04/17/20  4:24 PM  Result Value Ref Range Status   Enterococcus species NOT DETECTED NOT DETECTED Final   Listeria monocytogenes NOT DETECTED NOT DETECTED Final   Staphylococcus species DETECTED (A) NOT DETECTED Final    Comment: Methicillin (oxacillin) resistant coagulase negative staphylococcus. Possible blood culture contaminant (unless isolated from more than one blood culture draw or clinical case suggests pathogenicity). No antibiotic treatment is indicated for blood  culture contaminants. CRITICAL RESULT CALLED TO, READ BACK BY AND VERIFIED WITH: D,JONES RN @2257  04/18/20 EB    Staphylococcus aureus (BCID) NOT  DETECTED NOT DETECTED Final  Methicillin resistance DETECTED (A) NOT DETECTED Final    Comment: CRITICAL RESULT CALLED TO, READ BACK BY AND VERIFIED WITH: D,JONES RN @2257  04/18/20 EB    Streptococcus species NOT DETECTED NOT DETECTED Final   Streptococcus agalactiae NOT DETECTED NOT DETECTED Final   Streptococcus pneumoniae NOT DETECTED NOT DETECTED Final   Streptococcus pyogenes NOT DETECTED NOT DETECTED Final   Acinetobacter baumannii NOT DETECTED NOT DETECTED Final   Enterobacteriaceae species NOT DETECTED NOT DETECTED Final   Enterobacter cloacae complex NOT DETECTED NOT DETECTED Final   Escherichia coli NOT DETECTED NOT DETECTED Final   Klebsiella oxytoca NOT DETECTED NOT DETECTED Final   Klebsiella pneumoniae NOT DETECTED NOT DETECTED Final   Proteus species NOT DETECTED NOT DETECTED Final   Serratia marcescens NOT DETECTED NOT DETECTED Final   Haemophilus influenzae NOT DETECTED NOT DETECTED Final   Neisseria meningitidis NOT DETECTED NOT DETECTED Final   Pseudomonas aeruginosa NOT DETECTED NOT DETECTED Final   Candida albicans NOT DETECTED NOT DETECTED Final   Candida glabrata NOT DETECTED NOT DETECTED Final   Candida krusei NOT DETECTED NOT DETECTED Final   Candida parapsilosis NOT DETECTED NOT DETECTED Final   Candida tropicalis NOT DETECTED NOT DETECTED Final    Comment: Performed at [facility], [place] [address], [place], [place] [address]    Radiology Reports DG Chest 1 View  Result Date: 04/17/2020 CLINICAL DATA:  Chest pain, altered mental status EXAM: CHEST  1 VIEW COMPARISON:  02/21/2020 FINDINGS: The generalized lung apices, slightly limiting evaluation. The lungs are symmetrically expanded and are clear. No definite pneumothorax. No pleural effusion. The cardiomediastinal silhouette is unremarkable. Pulmonary vascularity is normal. Surgical clips are noted within the axilla bilaterally. No acute bone abnormality IMPRESSION: No active disease. Electronically  Signed   By: Fidela Salisbury MD   On: 04/17/2020 15:11   CT Head Wo Contrast  Result Date: 04/17/2020 CLINICAL DATA:  Delirium EXAM: CT HEAD WITHOUT CONTRAST TECHNIQUE: Contiguous axial images were obtained from the base of the skull through the vertex without intravenous contrast. COMPARISON:  Feb 21, 2020 FINDINGS: Brain: No evidence of acute territorial infarction, hemorrhage, hydrocephalus,extra-axial collection or mass lesion/mass effect. There is dilatation the ventricles and sulci consistent with age-related atrophy. Low-attenuation changes in the deep white matter consistent with small vessel ischemia. Prior lacunar infarct seen within the right basal ganglia. Vascular: No hyperdense vessel or unexpected calcification. Skull: The skull is intact. No fracture or focal lesion identified. Sinuses/Orbits: The visualized paranasal sinuses and mastoid air cells are clear. The orbits and globes intact. Other: None IMPRESSION: No acute intracranial abnormality. Findings consistent with age related atrophy and chronic small vessel ischemia Electronically Signed   By: Prudencio Pair M.D.   On: 04/17/2020 17:00   DG Swallowing Func-Speech Pathology  Result Date: 04/23/2020 Objective Swallowing Evaluation: Type of Study: MBS-Modified Barium Swallow Study  Patient Details Name: AVAYAH RAFFETY MRN: [id number] Date of Birth: 09-13-1925 Today's Date: 04/23/2020 Time: SLP Start Time (ACUTE ONLY): 1353 -SLP Stop Time (ACUTE ONLY): 1418 SLP Time Calculation (min) (ACUTE ONLY): 25 min Past Medical History: Past Medical History: Diagnosis Date . Anemia 02/02/2014 . Anxiety  . Arthritis   knee . Asthma  . CAD (coronary artery disease) cardiologist-  dr Bronson Ing  ([facility] in [place])  2003  Cath with nonobstructive CAD . Chest pain 08/14/2017 . Chronic headache  . Chronic lower back pain  . COPD (chronic obstructive pulmonary disease) ([place])  . Diastolic CHF, chronic ([place])  .  Duodenal diverticulum  . Edema of left lower  extremity  . Fibromyalgia  . Full dentures  . Gait disorder   uses walker . GERD (gastroesophageal reflux disease)  . Headache disorder 01/17/2015 . Heart murmur  . History of breast cancer  . History of Clostridium difficile  . History of colitis   2001 . History of CVA with residual deficit   06/ 2015  residual left hemiparesis . History of esophageal dilatation  . History of falling   last fall 06-25-2015 . History of hiatal hernia  . History of small bowel obstruction   04-25-2015  resolved without surgical intervention . Lesion of bladder  . Low vision, one eye   right eye . Macular degeneration of both eyes   right eye now low vision/  left eye getting injections currently . Malignant hypertension  . OAB (overactive bladder)  . Protein-calorie malnutrition, severe ([place]) 01/10/2015 . SBO (small bowel obstruction) ([place]) 04/22/2015 . Schatzki's ring  . Sciatica of right side 05/29/2015 . Syncope 08/14/2017 . Urge and stress incontinence  . Varicose veins  . Venous insufficiency, peripheral  . Vertigo  Past Surgical History: Past Surgical History: Procedure Laterality Date . [place]  from colon . ABDOMINAL HYSTERECTOMY  1954  w/ bilateral salpingoophorectomy . ANTERIOR CERVICAL DECOMP/DISCECTOMY FUSION  1971  "used bone off of my right hip" . BREAST LUMPECTOMY Left 2006 . CARDIAC CATHETERIZATION  11-29-2001  dr Tressia Miners turner  Non-obstructive CAD/  20% pLAD,  30% pD1, 50% mRCA/  normal LVF . CATARACT EXTRACTION W/ INTRAOCULAR LENS  IMPLANT, BILATERAL  right 2008//  left 2015 . CYSTOSCOPY WITH HYDRODISTENSION AND BIOPSY N/A 07/23/2015  Procedure: CYSTOSCOPY;HYDRODISTENSION;  Surgeon: Irine Seal, MD;  Location: [facility];  Service: Urology;  Laterality: N/A; . DILATION AND CURETTAGE OF UTERUS  1953   miscarriage . [facility] . KNEE ARTHROSCOPY Right 2005 . MASTECTOMY, PARTIAL Right 1999 . OVARIAN CYST REMOVAL  1946 . PATELLA FRACTURE SURGERY Left 2008 . TRANSTHORACIC  ECHOCARDIOGRAM  01-09-2015  mild LVH,  ef 94-17%, grade I diastolic dysfunction/  mild MR/  trivial pericardial effusion was identified HPI: Evellyn H. Doten is a 86 y.o. female with medical history significant for diastolic CHF, COPD, asthma, hypertension, CVA with residual left hemiparesis admitted on 04/17/20 with altered mentation/disorientation in the setting of presumed UTI and GPC bacteremia altered mentation and disorientation in the setting of presumed UTI and GPC bacteremia.Pt was placed on D3 and NTL on 7/16; MBS ordered to objectively assess the swallowing function.  No data recorded Assessment / Plan / Recommendation CHL IP CLINICAL IMPRESSIONS 04/23/2020 Clinical Impression Pt presents with severe oropharyngeal dysphagia compounded by altered mental status; swallowing is characterized by poor awareness of bolus, severe anterior spillage, poor bolus cohesion, prolonged AP transit, lingual pumping, premature spillage pooling into the pyriforms, delayed swallowing trigger, penetration of all liquids (thin, NTL & HTL), and trace to mod SILENT aspiration (with thin, NTL & HTL) (this was sensed X2 out of multiple episodes of aspiration). Note decreased pharyngeal squeeze and decreased laryngeal vestibule closure. At this time, Pt is cognitively unable to consistently participate in strategies however she naturally remains in the position of a chin tuck which was effective in decreasing/eliminating aspiration with tsp sips of HTL. No penetration/aspiration was noted with puree or solid textures however solid texture trials were limited secondary to fatigue. Pt is at VERY HIGH risk of aspiration as trace-mod aspiration was noted with all liquid textures  however least restrictive diet is D2/fine chop and HTL (administered with tsp); recommend precautions: small bites/sips, Pt should be seated upright, chin tuck (natural positioning) is ok and recommended for swallowing. Also note Pt is at risk for inadequate  nutrition and dehydration. Pt is a good candidate for free water protocol (oral care in between meals followed by ice chips or WATER by teaspoon). Recommend consider repeat MBS after Pt's mentation has improved, subjective upgrade will be challenging secondary to multiple episodes of silent aspiration. ST will continue to follow acutely SLP Visit Diagnosis Dysphagia, unspecified (R13.10) Attention and concentration deficit following -- Frontal lobe and executive function deficit following -- Impact on safety and function Mild aspiration risk;Moderate aspiration risk   CHL IP TREATMENT RECOMMENDATION 04/23/2020 Treatment Recommendations Therapy as outlined in treatment plan below   Prognosis 04/23/2020 Prognosis for Safe Diet Advancement Fair Barriers to Reach Goals Cognitive deficits;Severity of deficits Barriers/Prognosis Comment -- CHL IP DIET RECOMMENDATION 04/23/2020 SLP Diet Recommendations Dysphagia 2 (Fine chop) solids;Honey thick liquids Liquid Administration via Spoon Medication Administration Whole meds with puree Compensations Minimize environmental distractions;Slow rate;Small sips/bites;Monitor for anterior loss;Multiple dry swallows after each bite/sip;Clear throat after each swallow;Clear throat intermittently Postural Changes Remain semi-upright after after feeds/meals (Comment);Seated upright at 90 degrees   CHL IP OTHER RECOMMENDATIONS 04/23/2020 Recommended Consults -- Oral Care Recommendations Oral care BID Other Recommendations Order thickener from pharmacy   CHL IP FOLLOW UP RECOMMENDATIONS 04/23/2020 Follow up Recommendations 24 hour supervision/assistance;Skilled Nursing facility   [facility] IP FREQUENCY AND DURATION 04/23/2020 Speech Therapy Frequency (ACUTE ONLY) min 1 x/week Treatment Duration 1 week      CHL IP ORAL PHASE 04/23/2020 Oral Phase Impaired Oral - Pudding Teaspoon -- Oral - Pudding Cup -- Oral - Honey Teaspoon Left anterior bolus loss;Right anterior bolus loss;Impaired mastication;Weak  lingual manipulation;Lingual pumping;Reduced posterior propulsion;Holding of bolus;Lingual/palatal residue;Piecemeal swallowing;Delayed oral transit;Decreased bolus cohesion;Premature spillage Oral - Honey Cup Left anterior bolus loss;Right anterior bolus loss;Impaired mastication;Weak lingual manipulation;Lingual pumping;Reduced posterior propulsion;Holding of bolus;Lingual/palatal residue;Piecemeal swallowing;Delayed oral transit;Decreased bolus cohesion;Premature spillage Oral - Nectar Teaspoon Left anterior bolus loss;Right anterior bolus loss;Impaired mastication;Weak lingual manipulation;Lingual pumping;Reduced posterior propulsion;Holding of bolus;Lingual/palatal residue;Piecemeal swallowing;Delayed oral transit;Decreased bolus cohesion;Premature spillage Oral - Nectar Cup Left anterior bolus loss;Right anterior bolus loss;Impaired mastication;Weak lingual manipulation;Lingual pumping;Reduced posterior propulsion;Holding of bolus;Lingual/palatal residue;Piecemeal swallowing;Delayed oral transit;Decreased bolus cohesion;Premature spillage Oral - Nectar Straw Left anterior bolus loss;Right anterior bolus loss;Impaired mastication;Weak lingual manipulation;Lingual pumping;Reduced posterior propulsion;Holding of bolus;Lingual/palatal residue;Piecemeal swallowing;Delayed oral transit;Decreased bolus cohesion;Premature spillage Oral - Thin Teaspoon Left anterior bolus loss;Right anterior bolus loss;Impaired mastication;Weak lingual manipulation;Lingual pumping;Reduced posterior propulsion;Holding of bolus;Lingual/palatal residue;Piecemeal swallowing;Delayed oral transit;Decreased bolus cohesion;Premature spillage Oral - Thin Cup Left anterior bolus loss;Right anterior bolus loss;Impaired mastication;Weak lingual manipulation;Lingual pumping;Reduced posterior propulsion;Holding of bolus;Lingual/palatal residue;Piecemeal swallowing;Delayed oral transit;Decreased bolus cohesion;Premature spillage Oral - Thin Straw --  Oral - Puree Left anterior bolus loss;Right anterior bolus loss;Impaired mastication;Weak lingual manipulation;Lingual pumping;Reduced posterior propulsion;Holding of bolus;Lingual/palatal residue;Piecemeal swallowing;Delayed oral transit;Decreased bolus cohesion;Premature spillage Oral - Mech Soft -- Oral - Regular Left anterior bolus loss;Right anterior bolus loss;Impaired mastication;Weak lingual manipulation;Lingual pumping;Reduced posterior propulsion;Holding of bolus;Lingual/palatal residue;Piecemeal swallowing;Delayed oral transit;Decreased bolus cohesion;Premature spillage Oral - Multi-Consistency -- Oral - Pill -- Oral Phase - Comment --  CHL IP PHARYNGEAL PHASE 04/23/2020 Pharyngeal Phase Impaired Pharyngeal- Pudding Teaspoon -- Pharyngeal -- Pharyngeal- Pudding Cup -- Pharyngeal -- Pharyngeal- Honey Teaspoon Delayed swallow initiation-vallecula;Delayed swallow initiation-pyriform sinuses;Reduced pharyngeal peristalsis;Reduced airway/laryngeal closure;Penetration/Aspiration during swallow;Penetration/Apiration after swallow;Moderate aspiration;Pharyngeal residue - valleculae;Pharyngeal residue - pyriform;Lateral channel  residue Pharyngeal Material enters airway, passes BELOW cords without attempt by patient to eject out (silent aspiration) Pharyngeal- Honey Cup Delayed swallow initiation-vallecula;Delayed swallow initiation-pyriform sinuses;Reduced pharyngeal peristalsis;Reduced airway/laryngeal closure;Penetration/Aspiration during swallow;Penetration/Apiration after swallow;Moderate aspiration;Pharyngeal residue - valleculae;Pharyngeal residue - pyriform;Lateral channel residue Pharyngeal Material enters airway, passes BELOW cords without attempt by patient to eject out (silent aspiration) Pharyngeal- Nectar Teaspoon -- Pharyngeal -- Pharyngeal- Nectar Cup Delayed swallow initiation-vallecula;Delayed swallow initiation-pyriform sinuses;Reduced pharyngeal peristalsis;Reduced airway/laryngeal  closure;Penetration/Aspiration during swallow;Penetration/Apiration after swallow;Moderate aspiration;Pharyngeal residue - valleculae;Pharyngeal residue - pyriform;Lateral channel residue Pharyngeal Material enters airway, passes BELOW cords without attempt by patient to eject out (silent aspiration) Pharyngeal- Nectar Straw Delayed swallow initiation-vallecula;Delayed swallow initiation-pyriform sinuses;Reduced pharyngeal peristalsis;Reduced airway/laryngeal closure;Penetration/Aspiration during swallow;Penetration/Apiration after swallow;Moderate aspiration;Pharyngeal residue - valleculae;Pharyngeal residue - pyriform;Lateral channel residue Pharyngeal Material enters airway, passes BELOW cords without attempt by patient to eject out (silent aspiration) Pharyngeal- Thin Teaspoon Delayed swallow initiation-vallecula;Delayed swallow initiation-pyriform sinuses;Reduced pharyngeal peristalsis;Reduced airway/laryngeal closure;Penetration/Aspiration during swallow;Penetration/Apiration after swallow;Moderate aspiration;Pharyngeal residue - valleculae;Pharyngeal residue - pyriform;Lateral channel residue Pharyngeal Material enters airway, passes BELOW cords without attempt by patient to eject out (silent aspiration) Pharyngeal- Thin Cup Delayed swallow initiation-vallecula;Delayed swallow initiation-pyriform sinuses;Reduced pharyngeal peristalsis;Reduced airway/laryngeal closure;Penetration/Aspiration during swallow;Penetration/Apiration after swallow;Moderate aspiration;Pharyngeal residue - valleculae;Pharyngeal residue - pyriform;Lateral channel residue Pharyngeal Material enters airway, passes BELOW cords without attempt by patient to eject out (silent aspiration) Pharyngeal- Thin Straw NT Pharyngeal -- Pharyngeal- Puree Delayed swallow initiation-vallecula Pharyngeal -- Pharyngeal- Mechanical Soft -- Pharyngeal -- Pharyngeal- Regular Delayed swallow initiation-vallecula Pharyngeal -- Pharyngeal- Multi-consistency --  Pharyngeal -- Pharyngeal- Pill -- Pharyngeal -- Pharyngeal Comment pronounce cricopharyngeus  No flowsheet data found. Amelia H. Roddie Mc, CCC-SLP Speech Language Pathologist Wende Bushy 04/23/2020, 3:34 PM              [place] EKG SITE RITE  Result Date: 04/18/2020 If Site Rite image not attached, placement could not be confirmed due to current cardiac rhythm.    CBC Recent Labs  Lab 04/17/20 1619 04/20/20 0821  WBC 9.2 6.3  HGB 10.5* 9.6*  HCT 34.5* 31.3*  PLT 316 291  MCV 86.5 85.8  MCH 26.3 26.3  MCHC 30.4 30.7  RDW 15.1 14.9  LYMPHSABS 0.9  --   MONOABS 0.7  --   EOSABS 0.7*  --   BASOSABS 0.1  --     Chemistries  Recent Labs  Lab 04/17/20 1619 04/20/20 0821 04/21/20 0625 04/22/20 0701 04/23/20 0436 04/24/20 0900  NA 134* 140 140 144  --  146*  K 3.8 3.0* 2.6* 3.5  --  2.8*  CL 96* 104 104 108  --  106  CO2 29 28 28 28   --  29  GLUCOSE 112* 102* 100* 106*  --  113*  BUN 34* 19 18 17   --  30*  CREATININE 1.55* 1.28* 1.21* 1.18*  --  1.46*  CALCIUM 12.7* 12.3* 12.3* 12.5*  --  12.8*  MG 1.9  --  1.5*  --  2.0 1.9  AST 20  --   --   --   --  32  ALT 13  --   --   --   --  27  ALKPHOS 100  --   --   --   --  104  BILITOT 0.5  --   --   --   --  0.5   ------------------------------------------------------------------------------------------------------------------ No results for input(s): CHOL, HDL, LDLCALC, TRIG, CHOLHDL, LDLDIRECT in the last 72 hours.  No results found for: HGBA1C ------------------------------------------------------------------------------------------------------------------  No results for input(s): TSH, T4TOTAL, T3FREE, THYROIDAB in the last 72 hours.  Invalid input(s): FREET3 ------------------------------------------------------------------------------------------------------------------ No results for input(s): VITAMINB12, FOLATE, FERRITIN, TIBC, IRON, RETICCTPCT in the last 72 hours.  Coagulation profile Recent Labs  Lab  04/17/20 1619  INR 1.0    No results for input(s): DDIMER in the last 72 hours.  Cardiac Enzymes No results for input(s): CKMB, TROPONINI, MYOGLOBIN in the last 168 hours.  Invalid input(s): CK ------------------------------------------------------------------------------------------------------------------    Component Value Date/Time   BNP 21.0 02/21/2020 1614   Mairead Schwarzkopf M.D on 04/24/2020 at 12:33 PM  Go to [email or web address] - for contact info  [facility] - Office  [phone]

## 2020-04-24 NOTE — Care Management Important Message (Signed)
Important Message  Patient Details  Name: Kristie Morris MRN: [id number] Date of Birth: March 21, 1925   Medicare Important Message Given:  Yes     Tommy Medal 04/24/2020, 11:54 AM

## 2020-04-24 NOTE — Progress Notes (Signed)
Physical Therapy Treatment Patient Details Name: Kristie Morris MRN: [id number] DOB: 1925/05/16 Today's Date: 04/24/2020    History of Present Illness Kristie Morris is a 85 y.o. female with medical history significant for diastolic CHF, COPD, asthma, hypertension, CVA with residual left hemiparesis.Patient was brought to the ED with reports of confusion that started last night.  History is obtained from patient's granddaughter in law who is at bedside and takes care of patient, a limited history from patient herself.  On time of evaluation patient is awake alert oriented to person and place, she is able to answer simple questions but not give me a lot of detail.Patient also has been generally weak, with poor p.o. intake since yesterday.  Patient's urine was also dark, family was concerned that patient had a UTI.  On questioning patient she denies dysuria.  She has a chronic unchanged cough, she denies difficulty breathing.  No chest pain.  Patient also reports dizziness when standing over the past 2 weeks that is getting worse.At baseline patient ambulates with a wheelchair, does not have memory issues/dementia.    PT Comments    Patient tolerated sitting up at bedside demonstrating fair/poor sitting balance with frequent leaning to the right, able keep trunk in midline up to 1-2 minutes without support, slightly anxious with frequent reaching for nearby objects for support, and required active assistance to complete most BLE exercises due to weakness.  Patient required bed raised to complete full sit to stand and tolerated standing for up to 1-2 minutes before having to sit due to weakness.  Patient put back to bed after therapy.  Patient will benefit from continued physical therapy in hospital and recommended venue below to increase strength, balance, endurance for safe ADLs and gait.   Follow Up Recommendations  SNF     Equipment Recommendations  None recommended by PT    Recommendations for  Other Services       Precautions / Restrictions Precautions Precautions: Fall Restrictions Weight Bearing Restrictions: No    Mobility  Bed Mobility Overal bed mobility: Needs Assistance Bed Mobility: Supine to Sit;Sit to Supine     Supine to sit: Mod assist;Max assist Sit to supine: Mod assist;Max assist   General bed mobility comments: slow labored movement with frequent verbal/tactile cueing for proper use of BUE  Transfers Overall transfer level: Needs assistance Equipment used: Rolling walker (2 wheeled) Transfers: Sit to/from Stand Sit to Stand: Max assist;From elevated surface         General transfer comment: limited to standing for up to 1-2 minutes before having to sit due to weakness  Ambulation/Gait                 Stairs             Wheelchair Mobility    Modified Rankin (Stroke Patients Only)       Balance Overall balance assessment: Needs assistance Sitting-balance support: Feet supported;No upper extremity supported Sitting balance-Leahy Scale: Poor Sitting balance - Comments: fair/poor seated at EOB Postural control: Right lateral lean Standing balance support: During functional activity;Bilateral upper extremity supported Standing balance-Leahy Scale: Poor Standing balance comment: using RW                            Cognition Arousal/Alertness: Awake/alert Behavior During Therapy: WFL for tasks assessed/performed Overall Cognitive Status: Within Functional Limits for tasks assessed  Exercises General Exercises - Lower Extremity Ankle Circles/Pumps: Seated;AROM;Strengthening;Both;10 reps Long Arc Quad: Seated;AROM;Strengthening;Both;10 reps Hip Flexion/Marching: Seated;AROM;AAROM;Both;10 reps    General Comments        Pertinent Vitals/Pain Pain Assessment: Faces Faces Pain Scale: Hurts little more Pain Location: low back while seated at  bedside Pain Descriptors / Indicators: Aching;Grimacing;Sore    Home Living                      Prior Function            PT Goals (current goals can now be found in the care plan section) Acute Rehab PT Goals Patient Stated Goal: return home after rehab PT Goal Formulation: With patient Time For Goal Achievement: 05/02/20 Potential to Achieve Goals: Good Progress towards PT goals: Progressing toward goals    Frequency    Min 3X/week      PT Plan Current plan remains appropriate    Co-evaluation              AM-PAC PT "6 Clicks" Mobility   Outcome Measure  Help needed turning from your back to your side while in a flat bed without using bedrails?: A Lot Help needed moving from lying on your back to sitting on the side of a flat bed without using bedrails?: A Lot Help needed moving to and from a bed to a chair (including a wheelchair)?: Total Help needed standing up from a chair using your arms (e.g., wheelchair or bedside chair)?: A Lot Help needed to walk in hospital room?: Total Help needed climbing 3-5 steps with a railing? : Total 6 Click Score: 9    End of Session Equipment Utilized During Treatment: Oxygen Activity Tolerance: Patient tolerated treatment well;Patient limited by fatigue Patient left: in bed;with call bell/phone within reach;with family/visitor present Nurse Communication: Mobility status PT Visit Diagnosis: Unsteadiness on feet (R26.81);Other abnormalities of gait and mobility (R26.89);Muscle weakness (generalized) (M62.81)     Time: 2035-5974 PT Time Calculation (min) (ACUTE ONLY): 27 min  Charges:  $Therapeutic Exercise: 8-22 mins $Therapeutic Activity: 8-22 mins                     11:53 AM, 04/24/20 Lonell Grandchild, MPT Physical Therapist with [facility] [phone] office [phone] mobile phone

## 2020-04-24 NOTE — Progress Notes (Signed)
Palliative-  Patient's HCPOA was unable to attend planned 4pm meeting today. We briefly discussed Maish Vaya regarding patient's possible malignancy. Noted there were concerns for malignancy on previous chest CT scans in May and they had missed appointments for follow-up due to hospitalizations.  For now Ysidro Evert would like to continue efforts at information finding regarding cause of hypercalcemia. He is agreeable to scans, labs.  Plan to discuss further tomorrow with his sister involved in discussion as well. He will call with a good time.   Mariana Kaufman, AGNP-C Palliative Medicine  Please call Palliative Medicine team phone with any questions [phone]. For individual providers please see AMION.  No charge note

## 2020-04-24 NOTE — Plan of Care (Signed)

## 2020-04-24 NOTE — Progress Notes (Signed)
Palliative-  Thank you for this consult-   Plan made to meet with patient's Kristie Morris at 4pm today.   Mariana Kaufman, AGNP-C Palliative Medicine  Please call Palliative Medicine team phone with any questions [phone]. For individual providers please see AMION.  No charge note

## 2020-04-25 DIAGNOSIS — Z515 Encounter for palliative care: Secondary | ICD-10-CM

## 2020-04-25 DIAGNOSIS — R591 Generalized enlarged lymph nodes: Secondary | ICD-10-CM

## 2020-04-25 DIAGNOSIS — B370 Candidal stomatitis: Secondary | ICD-10-CM

## 2020-04-25 DIAGNOSIS — R54 Age-related physical debility: Secondary | ICD-10-CM

## 2020-04-25 DIAGNOSIS — I6930 Unspecified sequelae of cerebral infarction: Secondary | ICD-10-CM

## 2020-04-25 DIAGNOSIS — G9341 Metabolic encephalopathy: Secondary | ICD-10-CM

## 2020-04-25 DIAGNOSIS — Z7189 Other specified counseling: Secondary | ICD-10-CM

## 2020-04-25 DIAGNOSIS — R410 Disorientation, unspecified: Secondary | ICD-10-CM

## 2020-04-25 LAB — BASIC METABOLIC PANEL
Anion gap: 12 (ref 5–15)
BUN: 32 mg/dL — ABNORMAL HIGH (ref 8–23)
CO2: 26 mmol/L (ref 22–32)
Calcium: 11 mg/dL — ABNORMAL HIGH (ref 8.9–10.3)
Chloride: 106 mmol/L (ref 98–111)
Creatinine, Ser: 1.35 mg/dL — ABNORMAL HIGH (ref 0.44–1.00)
GFR calc Af Amer: 39 mL/min — ABNORMAL LOW (ref >60)
GFR calc non Af Amer: 34 mL/min — ABNORMAL LOW (ref >60)
Glucose, Bld: 112 mg/dL — ABNORMAL HIGH (ref 70–99)
Potassium: 3.3 mmol/L — ABNORMAL LOW (ref 3.5–5.1)
Sodium: 144 mmol/L (ref 135–145)

## 2020-04-25 LAB — PARATHYROID HORMONE, INTACT (NO CA): PTH: 14 pg/mL — ABNORMAL LOW (ref 15–65)

## 2020-04-25 MED ORDER — MAGIC MOUTHWASH
10.0000 mL | Freq: Three times a day (TID) | ORAL | Status: DC
  Administered 2020-04-25 – 2020-04-29 (×9): 10 mL via ORAL
  Filled 2020-04-25 (×7): qty 10

## 2020-04-25 MED ORDER — POTASSIUM CHLORIDE 10 MEQ/100ML IV SOLN
10.0000 meq | INTRAVENOUS | Status: AC
  Administered 2020-04-25 (×4): 10 meq via INTRAVENOUS
  Filled 2020-04-25 (×4): qty 100

## 2020-04-25 MED ORDER — DULOXETINE HCL 60 MG PO CPEP
60.0000 mg | ORAL_CAPSULE | Freq: Every day | ORAL | Status: DC
  Administered 2020-04-25 – 2020-04-29 (×5): 60 mg via ORAL
  Filled 2020-04-25 (×6): qty 1

## 2020-04-25 NOTE — Consult Note (Signed)
[facility] Consultation Oncology  Name: Kristie Morris      MRN: [id number]    Location: D664/Q034-74  Date: 04/25/2020 Time:6:26 PM   REFERRING PHYSICIAN: Dr. Wynetta Emery  REASON FOR CONSULT: Hypercalcemia   DIAGNOSIS: Hypercalcemia most likely due to underlying lymphoma/inflammatory process.  HISTORY OF PRESENT ILLNESS: This is a 86 year old white female seen in consultation today at the request of Dr. Wynetta Emery for further management of hypercalcemia.  She was admitted to the hospital on 04/17/2020 with confusion.  Limited history provided by the patient and history obtained from the chart.  She was also very weak and had decreased p.o. intake prior to presentation.  Reportedly hospitalized in [place] 5 days prior to presentation to [facility].  She was reportedly treated for urinary tract infection.  Calcium was 12.7 on presentation.  Albumin was 3.6.  Yesterday her calcium was 12.8.  She was started on Zometa 4 mg and calcitonin twice daily.  Today calcium improved 11.  CT scan of the chest, abdomen and pelvis showed pathologic left supraclavicular, mediastinal, retroperitoneal, right inguinal and right axillary adenopathy.  There was interval improvement in adenopathy involving the left hilum and subcarinal region.  Patient is also receiving IV antibiotics and IV fluids at 50 cc/h.  She uses wheelchair for ambulation while at home.  Granddaughter in law takes care of her at home.  PAST MEDICAL HISTORY:   Past Medical History:  Diagnosis Date  . Anemia 02/02/2014  . Anxiety   . Arthritis    knee  . Asthma   . CAD (coronary artery disease) cardiologist-  dr Bronson Ing  ([facility] in [place])   2003  Cath with nonobstructive CAD  . Chest pain 08/14/2017  . Chronic headache   . Chronic lower back pain   . COPD (chronic obstructive pulmonary disease) ([place])   . Diastolic CHF, chronic ([place])   . Duodenal diverticulum   . Edema of left lower extremity   . Fibromyalgia   . Full  dentures   . Gait disorder    uses walker  . GERD (gastroesophageal reflux disease)   . Headache disorder 01/17/2015  . Heart murmur   . History of breast cancer   . History of Clostridium difficile   . History of colitis    2001  . History of CVA with residual deficit    06/ 2015  residual left hemiparesis  . History of esophageal dilatation   . History of falling    last fall 06-25-2015  . History of hiatal hernia   . History of small bowel obstruction    04-25-2015  resolved without surgical intervention  . Lesion of bladder   . Low vision, one eye    right eye  . Macular degeneration of both eyes    right eye now low vision/  left eye getting injections currently  . Malignant hypertension   . OAB (overactive bladder)   . Protein-calorie malnutrition, severe ([place]) 01/10/2015  . SBO (small bowel obstruction) ([place]) 04/22/2015  . Schatzki's ring   . Sciatica of right side 05/29/2015  . Syncope 08/14/2017  . Urge and stress incontinence   . Varicose veins   . Venous insufficiency, peripheral   . Vertigo     ALLERGIES: Allergies  Allergen Reactions  . Zithromax [Azithromycin] Diarrhea    Pt in hosp for 2 weeks   . Celebrex [Celecoxib] Hives  . Ciprofloxacin Hives  . Metronidazole Hives  . Morphine And Related Rash  . Moxifloxacin Hives  .  Penicillins Hives  . Polymyxin B Hives  . Sulfa Antibiotics Hives  . Vancomycin Hives  . Vioxx [Rofecoxib] Hives      MEDICATIONS: I have reviewed the patient's current medications.     PAST SURGICAL HISTORY Past Surgical History:  Procedure Laterality Date  . [place]   from colon  . ABDOMINAL HYSTERECTOMY  1954   w/ bilateral salpingoophorectomy  . ANTERIOR CERVICAL DECOMP/DISCECTOMY FUSION  1971   "used bone off of my right hip"  . BREAST LUMPECTOMY Left 2006  . CARDIAC CATHETERIZATION  11-29-2001  dr Tressia Miners turner   Non-obstructive CAD/  20% pLAD,  30% pD1, 50% mRCA/  normal LVF  . CATARACT  EXTRACTION W/ INTRAOCULAR LENS  IMPLANT, BILATERAL  right 2008//  left 2015  . CYSTOSCOPY WITH HYDRODISTENSION AND BIOPSY N/A 07/23/2015   Procedure: CYSTOSCOPY;HYDRODISTENSION;  Surgeon: Irine Seal, MD;  Location: [facility];  Service: Urology;  Laterality: N/A;  . DILATION AND CURETTAGE OF UTERUS  1953    miscarriage  . [place]  . KNEE ARTHROSCOPY Right 2005  . MASTECTOMY, PARTIAL Right 1999  . OVARIAN CYST REMOVAL  1946  . PATELLA FRACTURE SURGERY Left 2008  . TRANSTHORACIC ECHOCARDIOGRAM  01-09-2015   mild LVH,  ef 10-93%, grade I diastolic dysfunction/  mild MR/  trivial pericardial effusion was identified    FAMILY HISTORY: Family History  Problem Relation Age of Onset  . Heart attack Mother   . CAD Mother   . Polycythemia Mother   . Heart attack Sister   . Cancer - Ovarian Sister   . Cancer - Colon Father   . Macular degeneration Sister   . Thyroid disease Daughter   . Rheumatic fever Daughter     SOCIAL HISTORY:  reports that she quit smoking about 32 years ago. Her smoking use included cigarettes. She has a 7.50 pack-year smoking history. She has never used smokeless tobacco. She reports current alcohol use of about 1.0 standard drink of alcohol per week. She reports that she does not use drugs.  PERFORMANCE STATUS: The patient's performance status is 3 - Symptomatic, >50% confined to bed  PHYSICAL EXAM: Most Recent Vital Signs: Blood pressure (!) 154/81, pulse 89, temperature 98.7 F (37.1 C), temperature source Oral, resp. rate 18, height 5\' 4"  (1.626 m), weight 159 lb 6.3 oz (72.3 kg), SpO2 100 %. BP (!) 154/81 (BP Location: Right Leg)   Pulse 89   Temp 98.7 F (37.1 C) (Oral)   Resp 18   Ht 5\' 4"  (1.626 m)   Wt 159 lb 6.3 oz (72.3 kg)   SpO2 100%   BMI 27.36 kg/m  General appearance: alert and delirious Lungs: Bilateral air entry present. Heart: regular rate and rhythm Abdomen: soft, non-tender; bowel sounds normal; no  masses,  no organomegaly Extremities: 2+ edema bilaterally.  No cyanosis. Skin: Skin color, texture, turgor normal. No rashes or lesions Lymph nodes: Palpable supraclavicular adenopathy.  No clinically palpable axillary adenopathy.  Palpable inguinal adenopathy.  LABORATORY DATA:  Results for orders placed or performed during the hospital encounter of 04/17/20 (from the past 48 hour(s))  Comprehensive metabolic panel     Status: Abnormal   Collection Time: 04/24/20  9:00 AM  Result Value Ref Range   Sodium 146 (H) 135 - 145 mmol/L   Potassium 2.8 (L) 3.5 - 5.1 mmol/L   Chloride 106 98 - 111 mmol/L   CO2 29 22 - 32 mmol/L  Glucose, Bld 113 (H) 70 - 99 mg/dL    Comment: Glucose reference range applies only to samples taken after fasting for at least 8 hours.   BUN 30 (H) 8 - 23 mg/dL   Creatinine, Ser 1.46 (H) 0.44 - 1.00 mg/dL   Calcium 12.8 (H) 8.9 - 10.3 mg/dL   Total Protein 6.4 (L) 6.5 - 8.1 g/dL   Albumin 3.4 (L) 3.5 - 5.0 g/dL   AST 32 15 - 41 U/L   ALT 27 0 - 44 U/L   Alkaline Phosphatase 104 38 - 126 U/L   Total Bilirubin 0.5 0.3 - 1.2 mg/dL   GFR calc non Af Amer 31 (L) >60 mL/min   GFR calc Af Amer 36 (L) >60 mL/min   Anion gap 11 5 - 15    Comment: Performed at [facility], [address], [place], [place] [address]  Magnesium     Status: None   Collection Time: 04/24/20  9:00 AM  Result Value Ref Range   Magnesium 1.9 1.7 - 2.4 mg/dL    Comment: Performed at [facility], [address] [place], [place] [address]  Parathyroid hormone, intact (no Ca)     Status: Abnormal   Collection Time: 04/24/20 12:54 PM  Result Value Ref Range   PTH 14 (L) 15 - 65 pg/mL    Comment: (NOTE) Performed At: [facility] [place], [place] [id number] Rush Farmer MD [id number]   Basic metabolic panel     Status: Abnormal   Collection Time: 04/25/20  6:25 AM  Result Value Ref Range   Sodium 144 135 - 145 mmol/L   Potassium 3.3 (L) 3.5 - 5.1 mmol/L    Chloride 106 98 - 111 mmol/L   CO2 26 22 - 32 mmol/L   Glucose, Bld 112 (H) 70 - 99 mg/dL    Comment: Glucose reference range applies only to samples taken after fasting for at least 8 hours.   BUN 32 (H) 8 - 23 mg/dL   Creatinine, Ser 1.35 (H) 0.44 - 1.00 mg/dL   Calcium 11.0 (H) 8.9 - 10.3 mg/dL   GFR calc non Af Amer 34 (L) >60 mL/min   GFR calc Af Amer 39 (L) >60 mL/min   Anion gap 12 5 - 15    Comment: Performed at [facility], [address] [place], [place] [address]      RADIOGRAPHY: CT CHEST W CONTRAST  Result Date: 04/24/2020 CLINICAL DATA:  Pathologic lymphadenopathy EXAM: CT CHEST, ABDOMEN, AND PELVIS WITH CONTRAST TECHNIQUE: Multidetector CT imaging of the chest, abdomen and pelvis was performed following the standard protocol during bolus administration of intravenous contrast. CONTRAST:  69mL OMNIPAQUE IOHEXOL 300 MG/ML  SOLN COMPARISON:  CT chest 02/21/2020, CT abdomen pelvis 04/22/2015 FINDINGS: CT CHEST FINDINGS Cardiovascular: Extensive multi-vessel coronary artery calcification again noted. Global cardiac size within normal limits. Small pericardial effusion is again noted without CT evidence of cardiac tamponade. The pericardial effusion demonstrates relatively bland appearing fluid in demonstrates no pericardial enhancement suggesting a transudative effusion. The central pulmonary arteries are enlarged in keeping with changes of pulmonary arterial hypertension. Extensive atherosclerotic calcification is seen within the thoracic aorta without aneurysm. Mediastinum/Nodes: Multiple thyroid nodules are again identified. There is pathologic supraclavicular mediastinal adenopathy identified, though left hilar and subcarinal adenopathy appears to have improved in the interval since prior examination. By example, left supraclavicular lymph node measures 13 mm x 16 mm at axial image # 5/2. Precarinal lymph node measures 19 mm  x 22 mm at axial image # 18/2. Additional pathologic  adenopathy is noted within the prevascular, aortopulmonary, lymph node groups. Shotty adenopathy is seen within the left axillary lymph node group, though this does not appear pathologically enlarged. Bilateral axillary lymph node dissection has been performed. Right mastectomy has been performed. Lungs/Pleura: Left basilar atelectasis again noted. Focal infiltrate within the lingula noted on prior examination has resolved. 4 mm pulmonary nodule within the right apex is new from prior examination and is nonspecific, but likely infectious or inflammatory given its rapid development since prior examination. No new focal pulmonary nodules or infiltrates are identified. Linear atelectasis within the right middle lobe noted. No pneumothorax or pleural effusion. The central airways are patent. Musculoskeletal: Subacute distal right clavicle fracture again noted. CT ABDOMEN PELVIS FINDINGS Hepatobiliary: Liver is unremarkable. Mild extrahepatic biliary ductal dilation is within acceptable limits for age. Cholelithiasis without CT evidence of acute cholecystitis. Pancreas: Unremarkable Spleen: Unremarkable Adrenals/Urinary Tract: The adrenal glands are unremarkable. There is marked, chronic atrophy the left kidney. Compensatory hypertrophy of the right kidney which is unremarkable. The bladder is unremarkable. Stomach/Bowel: Large volume stool within the rectal vault. Severe sigmoid diverticulosis. Stomach and small bowel are unremarkable. No free intraperitoneal gas or fluid. Vascular/Lymphatic: There is pathologic left periaortic and right common iliac lymphadenopathy. Borderline enlarged lymph node is noted within the right inguinal region. By example, 19 mm right common iliac lymph node is seen at axial image # 78/2. Borderline right inguinal node measures 13 mm x 26 mm at axial image # 99/2. There is extensive aortoiliac atherosclerotic calcification present. Particularly prominent calcification noted at the right  renal artery ostium. A saccular aneurysm of the infrarenal abdominal aorta is identified measuring 19 mm x 30 mm in axial image # 66 enlarged since prior examination where this measured 19 mm by 24 mm. Reproductive: Uterus absent.  No adnexal masses. Other: Moderate presacral edema is nonspecific, but may represent inflammatory changes related to stercoral proctitis. Musculoskeletal: Degenerative changes are seen within the lumbar spine. No focal lytic or blastic bone lesions are seen. IMPRESSION: Pathologic left supraclavicular, mediastinal, retroperitoneal, and possible right inguinal and right axillary adenopathy. Given the interval improvement in adenopathy involving the left hilum and subcarinal region, a waxing and waning inflammatory process should also be considered, such as sarcoidosis. Lymphoproliferative disorders, such as lymphoma or leukemia, are not excluded. PET CT examination may be helpful to assess metabolic activity of these abnormal lymph nodes. If hypermetabolic, left supraclavicular and possible right inguinal adenopathy would provide easy targets for ultrasound-guided biopsy for tissue diagnosis. Electronically Signed   By: Fidela Salisbury MD   On: 04/24/2020 19:48   CT ABDOMEN PELVIS W CONTRAST  Result Date: 04/24/2020 CLINICAL DATA:  Pathologic lymphadenopathy EXAM: CT CHEST, ABDOMEN, AND PELVIS WITH CONTRAST TECHNIQUE: Multidetector CT imaging of the chest, abdomen and pelvis was performed following the standard protocol during bolus administration of intravenous contrast. CONTRAST:  22mL OMNIPAQUE IOHEXOL 300 MG/ML  SOLN COMPARISON:  CT chest 02/21/2020, CT abdomen pelvis 04/22/2015 FINDINGS: CT CHEST FINDINGS Cardiovascular: Extensive multi-vessel coronary artery calcification again noted. Global cardiac size within normal limits. Small pericardial effusion is again noted without CT evidence of cardiac tamponade. The pericardial effusion demonstrates relatively bland appearing fluid in  demonstrates no pericardial enhancement suggesting a transudative effusion. The central pulmonary arteries are enlarged in keeping with changes of pulmonary arterial hypertension. Extensive atherosclerotic calcification is seen within the thoracic aorta without aneurysm. Mediastinum/Nodes: Multiple thyroid nodules are again identified. There  is pathologic supraclavicular mediastinal adenopathy identified, though left hilar and subcarinal adenopathy appears to have improved in the interval since prior examination. By example, left supraclavicular lymph node measures 13 mm x 16 mm at axial image # 5/2. Precarinal lymph node measures 19 mm x 22 mm at axial image # 18/2. Additional pathologic adenopathy is noted within the prevascular, aortopulmonary, lymph node groups. Shotty adenopathy is seen within the left axillary lymph node group, though this does not appear pathologically enlarged. Bilateral axillary lymph node dissection has been performed. Right mastectomy has been performed. Lungs/Pleura: Left basilar atelectasis again noted. Focal infiltrate within the lingula noted on prior examination has resolved. 4 mm pulmonary nodule within the right apex is new from prior examination and is nonspecific, but likely infectious or inflammatory given its rapid development since prior examination. No new focal pulmonary nodules or infiltrates are identified. Linear atelectasis within the right middle lobe noted. No pneumothorax or pleural effusion. The central airways are patent. Musculoskeletal: Subacute distal right clavicle fracture again noted. CT ABDOMEN PELVIS FINDINGS Hepatobiliary: Liver is unremarkable. Mild extrahepatic biliary ductal dilation is within acceptable limits for age. Cholelithiasis without CT evidence of acute cholecystitis. Pancreas: Unremarkable Spleen: Unremarkable Adrenals/Urinary Tract: The adrenal glands are unremarkable. There is marked, chronic atrophy the left kidney. Compensatory hypertrophy  of the right kidney which is unremarkable. The bladder is unremarkable. Stomach/Bowel: Large volume stool within the rectal vault. Severe sigmoid diverticulosis. Stomach and small bowel are unremarkable. No free intraperitoneal gas or fluid. Vascular/Lymphatic: There is pathologic left periaortic and right common iliac lymphadenopathy. Borderline enlarged lymph node is noted within the right inguinal region. By example, 19 mm right common iliac lymph node is seen at axial image # 78/2. Borderline right inguinal node measures 13 mm x 26 mm at axial image # 99/2. There is extensive aortoiliac atherosclerotic calcification present. Particularly prominent calcification noted at the right renal artery ostium. A saccular aneurysm of the infrarenal abdominal aorta is identified measuring 19 mm x 30 mm in axial image # 66 enlarged since prior examination where this measured 19 mm by 24 mm. Reproductive: Uterus absent.  No adnexal masses. Other: Moderate presacral edema is nonspecific, but may represent inflammatory changes related to stercoral proctitis. Musculoskeletal: Degenerative changes are seen within the lumbar spine. No focal lytic or blastic bone lesions are seen. IMPRESSION: Pathologic left supraclavicular, mediastinal, retroperitoneal, and possible right inguinal and right axillary adenopathy. Given the interval improvement in adenopathy involving the left hilum and subcarinal region, a waxing and waning inflammatory process should also be considered, such as sarcoidosis. Lymphoproliferative disorders, such as lymphoma or leukemia, are not excluded. PET CT examination may be helpful to assess metabolic activity of these abnormal lymph nodes. If hypermetabolic, left supraclavicular and possible right inguinal adenopathy would provide easy targets for ultrasound-guided biopsy for tissue diagnosis. Electronically Signed   By: Fidela Salisbury MD   On: 04/24/2020 19:48       ASSESSMENT and PLAN:  1.   Hypercalcemia: -Presentation with calcium 12.7 on 04/17/2020 with confusion. -Calcium increased to 12.8 yesterday. -Zometa 4 mg x 1 on 04/24/2020 with calcitonin twice daily started yesterday. -Calcium improved to 11 today.  PTH is low at 14. -Continue calcitonin twice daily.  Zometa usually takes 24 to 48 hours to bring down the calcium. -Continue gentle IV hydration.  2.  Lymphadenopathy: -CT CAP on 04/24/2020 shows pathologic left supraclavicular, mediastinal, retroperitoneal and right inguinal and right axillary adenopathy.  There was slight improvement in the left  hilar and subcarinal lymphadenopathy compared to CT scan from May 2021. -This is consistent with a lymphoproliferative process. -Given the patient's advanced age and poor performance status, I do not recommend biopsy for diagnosis at this time. -Agree with palliative care.  Recommend DNR.  3.  Metabolic encephalopathy: -Likely contributed by hypercalcemia.  4.  Pseudomonas UTI: -Cefepime was given from 04/19/2020 through 04/24/2020.  5.  Acute kidney injury: -Creatinine today is 1.35, improved from 1.5 on admission.  Continue hydration.  All questions were answered. The patient knows to call the clinic with any problems, questions or concerns. We can certainly see the patient much sooner if necessary.    Derek Jack

## 2020-04-25 NOTE — Progress Notes (Addendum)
  Speech Language Pathology Treatment: Dysphagia  Patient Details Name: Kristie Morris MRN: [id number] DOB: [birth year] Today's Date: 04/25/2020 Time: [id number] SLP Time Calculation (min) (ACUTE ONLY): 17 min  Assessment / Plan / Recommendation Clinical Impression  Pt seen at bedside for diet tolerance and education. No family present. RN, Larene Beach, reports that Pt has been coughing some during medication administration crushed in puree. Oral cavity is dry. Pt consumed HTL with occasional delayed cough. She leans sharply to the right and required frequent repositioning. Pt will need 100% feeder assist and will need assist with oral care (significant xerostomia). Please offer po frequently and ice chips after oral care for comfort. SLP will follow.    HPI HPI: Kristie Morris is a 86 y.o. female with medical history significant for diastolic CHF, COPD, asthma, hypertension, CVA with residual left hemiparesis admitted on 04/17/20 with altered mentation/disorientation in the setting of presumed UTI and GPC bacteremia altered mentation and disorientation in the setting of presumed UTI and GPC bacteremia.Pt was placed on D3 and NTL on 7/16; MBS ordered to objectively assess the swallowing function.      SLP Plan  Continue with current plan of care (consider liberalizing liquids if comfort care pursued)       Recommendations  Diet recommendations: Dysphagia 2;Honey-thick liquid Liquids provided via: Teaspoon Medication Administration: Crushed with puree Supervision: Staff to assist with self feeding Compensations: Minimize environmental distractions;Slow rate;Small sips/bites;Monitor for anterior loss;Multiple dry swallows after each bite/sip;Clear throat after each swallow;Clear throat intermittently Postural Changes and/or Swallow Maneuvers: Seated upright 90 degrees;Upright 30-60 min after meal                Oral Care Recommendations: Oral care prior to ice chip/H20 Follow up  Recommendations: 24 hour supervision/assistance;Skilled Nursing facility SLP Visit Diagnosis: Dysphagia, unspecified (R13.10) Plan: Continue with current plan of care (consider liberalizing liquids if comfort care pursued)       Thank you,  Genene Churn, [place]                  [place] 04/25/2020, 2:57 PM

## 2020-04-25 NOTE — Plan of Care (Signed)

## 2020-04-25 NOTE — Progress Notes (Signed)
Patient Demographics:    Kristie Morris, is a 86 y.o. female, DOB - 07-13-1925, VPX:[id number]  Admit date - 04/17/2020   Admitting Physician [name], MD  Outpatient Primary MD for the patient is [name], NP  LOS - 7  Chief Complaint  Patient presents with  . possible UTI        Subjective:    Kristie Morris remains confused and not eating or drinking well.  Her labs today reveal a new AKI and hypokalemia.  Her hypercalcemia persists.    GOC--- severe dysphagia with aspiration with most consistencies, essentially wheelchair-bound status----- further conversations with family about realistic expectations and overall prognosis- --requesting palliative care to help with further conversations about advanced directives and kindly help further delineate goals of care and expectations   Assessment  & Plan :    Principal Problem:   Acute metabolic encephalopathy Active Problems:   Diastolic dysfunction   Asthma, chronic   COPD (chronic obstructive pulmonary disease) ([place])   Essential hypertension   Personal history of stroke with current residual effects   Wheelchair bound   Bacteremia due to Gram-positive bacteria   Bacteremia   Hypercalcemia   Confusion  Brief Summary: 86 y.o. female with medical history significant for diastolic CHF, COPD, asthma, hypertension, CVA with residual left hemiparesis admitted on 04/17/20 with altered mentation/disorientation in the setting of presumed UTI and GPC bacteremia altered mentation and disorientation in the setting of presumed UTI and GPC bacteremia -Awaiting insurance approval for transfer to SNF rehab  A/p 1)Acute Metabolic Encephalopathy--likely due to hypercalcemia, ordered treatments today to bring down calcium levels. Pt also has AKI and presumed UTI.   She is not eating and drinking well and prognosis remains guarded.  --Anticipate improvement  with improvement of listed conditions --As per family patient is still not back to baseline from a cognitive standpoint  Hypercalcemia (likely from malignancy)  - s/p zoledronic acid x 1 dose, plus calcitonin and IV fluids, Calcium down to 11 today, recheck in AM.  Unfortunately she likely has an occult malignancy.  Palliative care discussion with family today about wishes.  PTH is pending.  I have asked for an oncology consult for recommendations regarding suspicious hilar adenopathy likely lymphoma vs leukemia.    Hypernatremia - added free water, recheck in AM.   2)CONS bacteremia--contaminants--- antibiotics discontinued-No fevers, no tachypnea, no tachycardia, no leukocytosis, lactic acid is not elevated---patient does Not meet sepsis criteria  3) Pseudomonas-UTI--- continue IV cefepime started on 04/19/2020 last dose 04/24/2020. TREATED.   4)AKI----acute kidney injury due to dehydration and concomitant meloxicam use.  Had improved with hydration but since not eating and drinking she has developed a recurrence of AKI. IV fluids reordered 7/21.  Some improvement noted 7/22.  Continue IV fluids as she is not eating or drinking well.  -- creatinine on admission= 1.55,  baseline creatinine = 1.1 (10/27/19)   -Creatinine is down to 1.1 ---renally adjust medications, avoid nephrotoxic agents / dehydration  / hypotension   5)Poor Venous Access--limiting ability to give IV fluids and IV antibiotics-had ultrasound assisted midline placement to the left arm  6) ambulatory dysfunction--- at baseline patient was able to stand and pivot but she gets around with a wheelchair she does not walk -  PT eval appreciated, SNF recommended  -PT/OT evaluations performed. SNF Rehab recommended. SNF appropriate as the patient has received 3 days of hospital care (or the 3 day period has been waived by the patient's insurance company) and is felt to need rehab services to restore this patient to their prior level of  function to achieve safe transition back to home care. This patient needs rehab services for at least 5 days per week and skilled nursing services daily to facilitate this transition. Rehab is being requested as the most appropriate d/c option for this patient and is NOT felt to be for custodial care as evidenced by previously able to stand, pivot and help with transfers-- prior to admission (comment on the patient's prior level of function). -She was requiring assist of 1 with ADLs prior to admission  7)Prior history of stroke more than 5 years ago with residual left-sided hemiparesis--- please see #6 above -Continue aspirin and crestor for secondary stroke prophylaxis - 8)Depression--- stable continue Cymbalta, and trazodone  9)Dysphagia--speech and swallow evaluation appreciated, recommends dysphagia 3 (Mech soft);Nectar-thick liquid  --Did very very poorly with swallow eval/MBS on 04/23/2020 please see full report  10) hypokalemia/Hypomagnesiemia---replace and recheck  11)Social/Ethics---  GOC--- severe dysphagia with aspiration with most consistencies, essentially wheelchair-bound status----- further conversations with family about realistic expectations and overall prognosis- --requesting palliative care to help with further conversations about advanced directives and kindly help further delineate goals of care and expectations --Remains full code at this time  Disposition/Need for in-Hospital Stay- patient unable to be discharged at this time due to --Awaiting Insurance approval to go to SNF rehab  Status is: Inpatient  Remains inpatient appropriate because:Awaiting Insurance approval to go to SNF rehab, treating and working up hypercalcemia with IV zaledronic acid and calcitonin.  Ongoing goals of care discussions and oncology consult regarding hilar adenopathy.   Disposition: The patient is from: Home              Anticipated d/c is to: SNF              Anticipated d/c date is: 2  days              Patient currently is not medically stable to d/c. Barriers: Not Clinically Stable- - treating clinically significant hypercalemia with mental status changes, also treating hypokalemia and AKI   Procedures:- --Swallowing difficulties persist, patient did poorly poorly with swallow eval/MBS on 04/23/20  Code Status : Full  Family Communication: discussed with grandson on telephone 7/22  Consults  : Palliative care  DVT Prophylaxis  :   - Heparin - SCDs   Lab Results  Component Value Date   PLT 291 04/20/2020   Inpatient Medications  Scheduled Meds: . amLODipine  5 mg Oral Daily  . aspirin EC  81 mg Oral Daily  . chlorhexidine  15 mL Mouth Rinse BID  . DULoxetine  60 mg Oral Daily  . heparin  5,000 Units Subcutaneous Q8H  . magic mouthwash  10 mL Oral TID  . mouth rinse  15 mL Mouth Rinse q12n4p  . oxybutynin  10 mg Oral QHS  . pantoprazole  40 mg Oral Q1500  . rosuvastatin  5 mg Oral QPM  . sodium chloride flush  10-40 mL Intracatheter Q12H  . traZODone  50 mg Oral QHS   Continuous Infusions: . 0.45 % NaCl with KCl 20 mEq / L 70 mL/hr at 04/25/20 0532   PRN Meds:.acetaminophen **OR** acetaminophen, albuterol, labetalol, ondansetron **OR** ondansetron (  ZOFRAN) IV, polyethylene glycol, sodium chloride flush   Anti-infectives (From admission, onward)   Start     Dose/Rate Route Frequency Ordered Stop   04/19/20 2000  ceFEPIme (MAXIPIME) 1 g in sodium chloride 0.9 % 100 mL IVPB        1 g 200 mL/hr over 30 Minutes Intravenous Every 24 hours 04/19/20 1949 04/24/20 2121   04/18/20 1645  cefTRIAXone (ROCEPHIN) 2 g in sodium chloride 0.9 % 100 mL IVPB  Status:  Discontinued        2 g 200 mL/hr over 30 Minutes Intravenous Every 24 hours 04/18/20 1625 04/19/20 1928   04/17/20 1530  cefTRIAXone (ROCEPHIN) 1 g in sodium chloride 0.9 % 100 mL IVPB  Status:  Discontinued        1 g 200 mL/hr over 30 Minutes Intravenous Every 24 hours 04/17/20 1517 04/17/20 2212        Objective:   Vitals:   04/24/20 1927 04/24/20 2229 04/25/20 0535 04/25/20 1336  BP:  (!) 161/70 (!) 141/88 (!) 154/81  Pulse:  (!) 103 (!) 103 89  Resp:  20 17 18   Temp:  98.9 F (37.2 C) 99.2 F (37.3 C) 98.7 F (37.1 C)  TempSrc:  Oral  Oral  SpO2: 98% 96% 100% 100%  Weight:      Height:        Wt Readings from Last 3 Encounters:  04/24/20 72.3 kg  02/21/20 83.5 kg  02/21/20 83.5 kg    Intake/Output Summary (Last 24 hours) at 04/25/2020 1434 Last data filed at 04/25/2020 1300 Gross per 24 hour  Intake 528.66 ml  Output 1650 ml  Net -1121.34 ml   Physical Exam Gen:- Awake , somnolent, not eating or drinking well  HEENT:- [place]/AT, No sclera icterus Neck-Supple Neck,No JVD,.  Lungs-diminished in bases, no wheezing  CV- S1, S2 normal, regular  Abd-  +ve B.Sounds, Abd Soft, No tenderness, no CVA area tenderness    Extremity/Skin:- No  edema, pedal pulses present  Psych-affect is intermittently confused with intermittent disorientation neuro-residual left-sided hemiparesis, no new focal deficits, no tremors   Data Review:   Micro Results Recent Results (from the past 240 hour(s))  Urine culture     Status: Abnormal   Collection Time: 04/17/20  2:34 PM   Specimen: In/Out Cath Urine  Result Value Ref Range Status   Specimen Description   Final    IN/OUT CATH URINE Performed at [facility], [address], [place], [place] [address]    Special Requests   Final    NONE Performed at [facility], [address], [place], [place] [address]    Culture 30,000 COLONIES/mL PSEUDOMONAS AERUGINOSA (A)  Final   Report Status 04/20/2020 FINAL  Final   Organism ID, Bacteria PSEUDOMONAS AERUGINOSA (A)  Final      Susceptibility   Pseudomonas aeruginosa - MIC*    CEFTAZIDIME 2 SENSITIVE Sensitive     CIPROFLOXACIN <=0.25 SENSITIVE Sensitive     GENTAMICIN <=1 SENSITIVE Sensitive     IMIPENEM 1 SENSITIVE Sensitive     PIP/TAZO 8 SENSITIVE Sensitive     CEFEPIME 2  SENSITIVE Sensitive     * 30,000 COLONIES/mL PSEUDOMONAS AERUGINOSA  SARS Coronavirus 2 by RT PCR (hospital order, performed in [place] hospital lab) Nasopharyngeal Nasopharyngeal Swab     Status: None   Collection Time: 04/17/20  3:43 PM   Specimen: Nasopharyngeal Swab  Result Value Ref Range Status   SARS Coronavirus 2 NEGATIVE NEGATIVE  Final    Comment: (NOTE) SARS-CoV-2 target nucleic acids are NOT DETECTED.  The SARS-CoV-2 RNA is generally detectable in upper and lower respiratory specimens during the acute phase of infection. The lowest concentration of SARS-CoV-2 viral copies this assay can detect is 250 copies / mL. A negative result does not preclude SARS-CoV-2 infection and should not be used as the sole basis for treatment or other patient management decisions.  A negative result may occur with improper specimen collection / handling, submission of specimen other than nasopharyngeal swab, presence of viral mutation(s) within the areas targeted by this assay, and inadequate number of viral copies (<250 copies / mL). A negative result must be combined with clinical observations, patient history, and epidemiological information.  Fact Sheet for Patients:   StrictlyIdeas.no  Fact Sheet for Healthcare Providers: BankingDealers.co.za  This test is not yet approved or  cleared by the [place] FDA and has been authorized for detection and/or diagnosis of SARS-CoV-2 by FDA under an Emergency Use Authorization (EUA).  This EUA will remain in effect (meaning this test can be used) for the duration of the COVID-19 declaration under Section 564(b)(1) of the Act, 21 U.S.C. section 360bbb-3(b)(1), unless the authorization is terminated or revoked sooner.  Performed at [facility], [address], [place], [place] [address]   Blood Culture (routine x 2)     Status: None   Collection Time: 04/17/20  4:19 PM   Specimen: Right  Antecubital; Blood  Result Value Ref Range Status   Specimen Description RIGHT ANTECUBITAL  Final   Special Requests   Final    BOTTLES DRAWN AEROBIC AND ANAEROBIC Blood Culture adequate volume   Culture   Final    NO GROWTH 5 DAYS Performed at [facility], [address] [place], [place] [address]    Report Status 04/22/2020 FINAL  Final  Blood Culture (routine x 2)     Status: Abnormal   Collection Time: 04/17/20  4:24 PM   Specimen: Left Antecubital; Blood  Result Value Ref Range Status   Specimen Description   Final    LEFT ANTECUBITAL Performed at [facility], [address], [place], [place] [address]    Special Requests   Final    BOTTLES DRAWN AEROBIC AND ANAEROBIC Blood Culture adequate volume Performed at [facility], [address], [place], [place] [address]    Culture  Setup Time   Final    BOTH BOTTLES GRAM POSITIVE COCCI Gram Stain Report Called to,Read Back By and Verified With: Lowry Ram 04/18/20 @1619  BY JONEST  CRITICAL RESULT CALLED TO, READ BACK BY AND VERIFIED WITH: D,JONES RN @2257  04/18/20 EB    Culture (A)  Final    STAPHYLOCOCCUS EPIDERMIDIS THE SIGNIFICANCE OF ISOLATING THIS ORGANISM FROM A SINGLE SET OF BLOOD CULTURES WHEN MULTIPLE SETS ARE DRAWN IS UNCERTAIN. PLEASE NOTIFY THE MICROBIOLOGY DEPARTMENT WITHIN ONE WEEK IF SPECIATION AND SENSITIVITIES ARE REQUIRED. Performed at [facility], [place] [address] [place], [place] [address]    Report Status 04/20/2020 FINAL  Final  Blood Culture ID Panel (Reflexed)     Status: Abnormal   Collection Time: 04/17/20  4:24 PM  Result Value Ref Range Status   Enterococcus species NOT DETECTED NOT DETECTED Final   Listeria monocytogenes NOT DETECTED NOT DETECTED Final   Staphylococcus species DETECTED (A) NOT DETECTED Final    Comment: Methicillin (oxacillin) resistant coagulase negative staphylococcus. Possible blood culture contaminant (unless isolated from more than one blood culture draw or clinical  case suggests pathogenicity). No antibiotic treatment is indicated for blood  culture contaminants. CRITICAL RESULT CALLED TO, READ BACK BY AND VERIFIED WITH: D,JONES RN @2257  04/18/20 EB    Staphylococcus aureus (BCID) NOT DETECTED NOT DETECTED Final   Methicillin resistance DETECTED (A) NOT DETECTED Final    Comment: CRITICAL RESULT CALLED TO, READ BACK BY AND VERIFIED WITH: D,JONES RN @2257  04/18/20 EB    Streptococcus species NOT DETECTED NOT DETECTED Final   Streptococcus agalactiae NOT DETECTED NOT DETECTED Final   Streptococcus pneumoniae NOT DETECTED NOT DETECTED Final   Streptococcus pyogenes NOT DETECTED NOT DETECTED Final   Acinetobacter baumannii NOT DETECTED NOT DETECTED Final   Enterobacteriaceae species NOT DETECTED NOT DETECTED Final   Enterobacter cloacae complex NOT DETECTED NOT DETECTED Final   Escherichia coli NOT DETECTED NOT DETECTED Final   Klebsiella oxytoca NOT DETECTED NOT DETECTED Final   Klebsiella pneumoniae NOT DETECTED NOT DETECTED Final   Proteus species NOT DETECTED NOT DETECTED Final   Serratia marcescens NOT DETECTED NOT DETECTED Final   Haemophilus influenzae NOT DETECTED NOT DETECTED Final   Neisseria meningitidis NOT DETECTED NOT DETECTED Final   Pseudomonas aeruginosa NOT DETECTED NOT DETECTED Final   Candida albicans NOT DETECTED NOT DETECTED Final   Candida glabrata NOT DETECTED NOT DETECTED Final   Candida krusei NOT DETECTED NOT DETECTED Final   Candida parapsilosis NOT DETECTED NOT DETECTED Final   Candida tropicalis NOT DETECTED NOT DETECTED Final    Comment: Performed at [facility], [place]. [address], [place], [place] [address]    Radiology Reports DG Chest 1 View  Result Date: 04/17/2020 CLINICAL DATA:  Chest pain, altered mental status EXAM: CHEST  1 VIEW COMPARISON:  02/21/2020 FINDINGS: The generalized lung apices, slightly limiting evaluation. The lungs are symmetrically expanded and are clear. No definite pneumothorax. No  pleural effusion. The cardiomediastinal silhouette is unremarkable. Pulmonary vascularity is normal. Surgical clips are noted within the axilla bilaterally. No acute bone abnormality IMPRESSION: No active disease. Electronically Signed   By: Fidela Salisbury MD   On: 04/17/2020 15:11   CT Head Wo Contrast  Result Date: 04/17/2020 CLINICAL DATA:  Delirium EXAM: CT HEAD WITHOUT CONTRAST TECHNIQUE: Contiguous axial images were obtained from the base of the skull through the vertex without intravenous contrast. COMPARISON:  Feb 21, 2020 FINDINGS: Brain: No evidence of acute territorial infarction, hemorrhage, hydrocephalus,extra-axial collection or mass lesion/mass effect. There is dilatation the ventricles and sulci consistent with age-related atrophy. Low-attenuation changes in the deep white matter consistent with small vessel ischemia. Prior lacunar infarct seen within the right basal ganglia. Vascular: No hyperdense vessel or unexpected calcification. Skull: The skull is intact. No fracture or focal lesion identified. Sinuses/Orbits: The visualized paranasal sinuses and mastoid air cells are clear. The orbits and globes intact. Other: None IMPRESSION: No acute intracranial abnormality. Findings consistent with age related atrophy and chronic small vessel ischemia Electronically Signed   By: Prudencio Pair M.D.   On: 04/17/2020 17:00   CT CHEST W CONTRAST  Result Date: 04/24/2020 CLINICAL DATA:  Pathologic lymphadenopathy EXAM: CT CHEST, ABDOMEN, AND PELVIS WITH CONTRAST TECHNIQUE: Multidetector CT imaging of the chest, abdomen and pelvis was performed following the standard protocol during bolus administration of intravenous contrast. CONTRAST:  49mL OMNIPAQUE IOHEXOL 300 MG/ML  SOLN COMPARISON:  CT chest 02/21/2020, CT abdomen pelvis 04/22/2015 FINDINGS: CT CHEST FINDINGS Cardiovascular: Extensive multi-vessel coronary artery calcification again noted. Global cardiac size within normal limits. Small  pericardial effusion is again noted  without CT evidence of cardiac tamponade. The pericardial effusion demonstrates relatively bland appearing fluid in demonstrates no pericardial enhancement suggesting a transudative effusion. The central pulmonary arteries are enlarged in keeping with changes of pulmonary arterial hypertension. Extensive atherosclerotic calcification is seen within the thoracic aorta without aneurysm. Mediastinum/Nodes: Multiple thyroid nodules are again identified. There is pathologic supraclavicular mediastinal adenopathy identified, though left hilar and subcarinal adenopathy appears to have improved in the interval since prior examination. By example, left supraclavicular lymph node measures 13 mm x 16 mm at axial image # 5/2. Precarinal lymph node measures 19 mm x 22 mm at axial image # 18/2. Additional pathologic adenopathy is noted within the prevascular, aortopulmonary, lymph node groups. Shotty adenopathy is seen within the left axillary lymph node group, though this does not appear pathologically enlarged. Bilateral axillary lymph node dissection has been performed. Right mastectomy has been performed. Lungs/Pleura: Left basilar atelectasis again noted. Focal infiltrate within the lingula noted on prior examination has resolved. 4 mm pulmonary nodule within the right apex is new from prior examination and is nonspecific, but likely infectious or inflammatory given its rapid development since prior examination. No new focal pulmonary nodules or infiltrates are identified. Linear atelectasis within the right middle lobe noted. No pneumothorax or pleural effusion. The central airways are patent. Musculoskeletal: Subacute distal right clavicle fracture again noted. CT ABDOMEN PELVIS FINDINGS Hepatobiliary: Liver is unremarkable. Mild extrahepatic biliary ductal dilation is within acceptable limits for age. Cholelithiasis without CT evidence of acute cholecystitis. Pancreas: Unremarkable  Spleen: Unremarkable Adrenals/Urinary Tract: The adrenal glands are unremarkable. There is marked, chronic atrophy the left kidney. Compensatory hypertrophy of the right kidney which is unremarkable. The bladder is unremarkable. Stomach/Bowel: Large volume stool within the rectal vault. Severe sigmoid diverticulosis. Stomach and small bowel are unremarkable. No free intraperitoneal gas or fluid. Vascular/Lymphatic: There is pathologic left periaortic and right common iliac lymphadenopathy. Borderline enlarged lymph node is noted within the right inguinal region. By example, 19 mm right common iliac lymph node is seen at axial image # 78/2. Borderline right inguinal node measures 13 mm x 26 mm at axial image # 99/2. There is extensive aortoiliac atherosclerotic calcification present. Particularly prominent calcification noted at the right renal artery ostium. A saccular aneurysm of the infrarenal abdominal aorta is identified measuring 19 mm x 30 mm in axial image # 66 enlarged since prior examination where this measured 19 mm by 24 mm. Reproductive: Uterus absent.  No adnexal masses. Other: Moderate presacral edema is nonspecific, but may represent inflammatory changes related to stercoral proctitis. Musculoskeletal: Degenerative changes are seen within the lumbar spine. No focal lytic or blastic bone lesions are seen. IMPRESSION: Pathologic left supraclavicular, mediastinal, retroperitoneal, and possible right inguinal and right axillary adenopathy. Given the interval improvement in adenopathy involving the left hilum and subcarinal region, a waxing and waning inflammatory process should also be considered, such as sarcoidosis. Lymphoproliferative disorders, such as lymphoma or leukemia, are not excluded. PET CT examination may be helpful to assess metabolic activity of these abnormal lymph nodes. If hypermetabolic, left supraclavicular and possible right inguinal adenopathy would provide easy targets for  ultrasound-guided biopsy for tissue diagnosis. Electronically Signed   By: Fidela Salisbury MD   On: 04/24/2020 19:48   CT ABDOMEN PELVIS W CONTRAST  Result Date: 04/24/2020 CLINICAL DATA:  Pathologic lymphadenopathy EXAM: CT CHEST, ABDOMEN, AND PELVIS WITH CONTRAST TECHNIQUE: Multidetector CT imaging of the chest, abdomen and pelvis was performed following the standard protocol during bolus administration of  intravenous contrast. CONTRAST:  22mL OMNIPAQUE IOHEXOL 300 MG/ML  SOLN COMPARISON:  CT chest 02/21/2020, CT abdomen pelvis 04/22/2015 FINDINGS: CT CHEST FINDINGS Cardiovascular: Extensive multi-vessel coronary artery calcification again noted. Global cardiac size within normal limits. Small pericardial effusion is again noted without CT evidence of cardiac tamponade. The pericardial effusion demonstrates relatively bland appearing fluid in demonstrates no pericardial enhancement suggesting a transudative effusion. The central pulmonary arteries are enlarged in keeping with changes of pulmonary arterial hypertension. Extensive atherosclerotic calcification is seen within the thoracic aorta without aneurysm. Mediastinum/Nodes: Multiple thyroid nodules are again identified. There is pathologic supraclavicular mediastinal adenopathy identified, though left hilar and subcarinal adenopathy appears to have improved in the interval since prior examination. By example, left supraclavicular lymph node measures 13 mm x 16 mm at axial image # 5/2. Precarinal lymph node measures 19 mm x 22 mm at axial image # 18/2. Additional pathologic adenopathy is noted within the prevascular, aortopulmonary, lymph node groups. Shotty adenopathy is seen within the left axillary lymph node group, though this does not appear pathologically enlarged. Bilateral axillary lymph node dissection has been performed. Right mastectomy has been performed. Lungs/Pleura: Left basilar atelectasis again noted. Focal infiltrate within the lingula  noted on prior examination has resolved. 4 mm pulmonary nodule within the right apex is new from prior examination and is nonspecific, but likely infectious or inflammatory given its rapid development since prior examination. No new focal pulmonary nodules or infiltrates are identified. Linear atelectasis within the right middle lobe noted. No pneumothorax or pleural effusion. The central airways are patent. Musculoskeletal: Subacute distal right clavicle fracture again noted. CT ABDOMEN PELVIS FINDINGS Hepatobiliary: Liver is unremarkable. Mild extrahepatic biliary ductal dilation is within acceptable limits for age. Cholelithiasis without CT evidence of acute cholecystitis. Pancreas: Unremarkable Spleen: Unremarkable Adrenals/Urinary Tract: The adrenal glands are unremarkable. There is marked, chronic atrophy the left kidney. Compensatory hypertrophy of the right kidney which is unremarkable. The bladder is unremarkable. Stomach/Bowel: Large volume stool within the rectal vault. Severe sigmoid diverticulosis. Stomach and small bowel are unremarkable. No free intraperitoneal gas or fluid. Vascular/Lymphatic: There is pathologic left periaortic and right common iliac lymphadenopathy. Borderline enlarged lymph node is noted within the right inguinal region. By example, 19 mm right common iliac lymph node is seen at axial image # 78/2. Borderline right inguinal node measures 13 mm x 26 mm at axial image # 99/2. There is extensive aortoiliac atherosclerotic calcification present. Particularly prominent calcification noted at the right renal artery ostium. A saccular aneurysm of the infrarenal abdominal aorta is identified measuring 19 mm x 30 mm in axial image # 66 enlarged since prior examination where this measured 19 mm by 24 mm. Reproductive: Uterus absent.  No adnexal masses. Other: Moderate presacral edema is nonspecific, but may represent inflammatory changes related to stercoral proctitis. Musculoskeletal:  Degenerative changes are seen within the lumbar spine. No focal lytic or blastic bone lesions are seen. IMPRESSION: Pathologic left supraclavicular, mediastinal, retroperitoneal, and possible right inguinal and right axillary adenopathy. Given the interval improvement in adenopathy involving the left hilum and subcarinal region, a waxing and waning inflammatory process should also be considered, such as sarcoidosis. Lymphoproliferative disorders, such as lymphoma or leukemia, are not excluded. PET CT examination may be helpful to assess metabolic activity of these abnormal lymph nodes. If hypermetabolic, left supraclavicular and possible right inguinal adenopathy would provide easy targets for ultrasound-guided biopsy for tissue diagnosis. Electronically Signed   By: Fidela Salisbury MD   On: 04/24/2020 19:48  DG Swallowing Func-Speech Pathology  Result Date: 04/23/2020 Objective Swallowing Evaluation: Type of Study: MBS-Modified Barium Swallow Study  Patient Details Name: ANNICK DIMAIO MRN: [id number] Date of Birth: [birth year] Today's Date: 04/23/2020 Time: SLP Start Time (ACUTE ONLY): 1353 -SLP Stop Time (ACUTE ONLY): 1418 SLP Time Calculation (min) (ACUTE ONLY): 25 min Past Medical History: Past Medical History: Diagnosis Date . Anemia 02/02/2014 . Anxiety  . Arthritis   knee . Asthma  . CAD (coronary artery disease) cardiologist-  dr Bronson Ing  ([facility] in [place])  2003  Cath with nonobstructive CAD . Chest pain 08/14/2017 . Chronic headache  . Chronic lower back pain  . COPD (chronic obstructive pulmonary disease) ([place])  . Diastolic CHF, chronic ([place])  . Duodenal diverticulum  . Edema of left lower extremity  . Fibromyalgia  . Full dentures  . Gait disorder   uses walker . GERD (gastroesophageal reflux disease)  . Headache disorder 01/17/2015 . Heart murmur  . History of breast cancer  . History of Clostridium difficile  . History of colitis   2001 . History of CVA with residual deficit   06/ 2015   residual left hemiparesis . History of esophageal dilatation  . History of falling   last fall 06-25-2015 . History of hiatal hernia  . History of small bowel obstruction   04-25-2015  resolved without surgical intervention . Lesion of bladder  . Low vision, one eye   right eye . Macular degeneration of both eyes   right eye now low vision/  left eye getting injections currently . Malignant hypertension  . OAB (overactive bladder)  . Protein-calorie malnutrition, severe ([place]) 01/10/2015 . SBO (small bowel obstruction) ([place]) 04/22/2015 . Schatzki's ring  . Sciatica of right side 05/29/2015 . Syncope 08/14/2017 . Urge and stress incontinence  . Varicose veins  . Venous insufficiency, peripheral  . Vertigo  Past Surgical History: Past Surgical History: Procedure Laterality Date . Short  from colon . ABDOMINAL HYSTERECTOMY  1954  w/ bilateral salpingoophorectomy . ANTERIOR CERVICAL DECOMP/DISCECTOMY FUSION  1971  "used bone off of my right hip" . BREAST LUMPECTOMY Left 2006 . CARDIAC CATHETERIZATION  11-29-2001  dr Tressia Miners turner  Non-obstructive CAD/  20% pLAD,  30% pD1, 50% mRCA/  normal LVF . CATARACT EXTRACTION W/ INTRAOCULAR LENS  IMPLANT, BILATERAL  right 2008//  left 2015 . CYSTOSCOPY WITH HYDRODISTENSION AND BIOPSY N/A 07/23/2015  Procedure: CYSTOSCOPY;HYDRODISTENSION;  Surgeon: Irine Seal, MD;  Location[facility];  Service: Urology;  Laterality: N/A; . DILATION AND CURETTAGE OF UTERUS  1953   miscarriage . [place] . KNEE ARTHROSCOPY Right 2005 . MASTECTOMY, PARTIAL Right 1999 . OVARIAN CYST REMOVAL  1946 . PATELLA FRACTURE SURGERY Left 2008 . TRANSTHORACIC ECHOCARDIOGRAM  01-09-2015  mild LVH,  ef 12-45%, grade I diastolic dysfunction/  mild MR/  trivial pericardial effusion was identified HPI: Zuly H. Olejnik is a 86 y.o. female with medical history significant for diastolic CHF, COPD, asthma, hypertension, CVA with residual left hemiparesis admitted on  04/17/20 with altered mentation/disorientation in the setting of presumed UTI and GPC bacteremia altered mentation and disorientation in the setting of presumed UTI and GPC bacteremia.Pt was placed on D3 and NTL on 7/16; MBS ordered to objectively assess the swallowing function.  No data recorded Assessment / Plan / Recommendation CHL IP CLINICAL IMPRESSIONS 04/23/2020 Clinical Impression Pt presents with severe oropharyngeal dysphagia compounded by altered mental status; swallowing is characterized by poor awareness of bolus,  severe anterior spillage, poor bolus cohesion, prolonged AP transit, lingual pumping, premature spillage pooling into the pyriforms, delayed swallowing trigger, penetration of all liquids (thin, NTL & HTL), and trace to mod SILENT aspiration (with thin, NTL & HTL) (this was sensed X2 out of multiple episodes of aspiration). Note decreased pharyngeal squeeze and decreased laryngeal vestibule closure. At this time, Pt is cognitively unable to consistently participate in strategies however she naturally remains in the position of a chin tuck which was effective in decreasing/eliminating aspiration with tsp sips of HTL. No penetration/aspiration was noted with puree or solid textures however solid texture trials were limited secondary to fatigue. Pt is at VERY HIGH risk of aspiration as trace-mod aspiration was noted with all liquid textures however least restrictive diet is D2/fine chop and HTL (administered with tsp); recommend precautions: small bites/sips, Pt should be seated upright, chin tuck (natural positioning) is ok and recommended for swallowing. Also note Pt is at risk for inadequate nutrition and dehydration. Pt is a good candidate for free water protocol (oral care in between meals followed by ice chips or WATER by teaspoon). Recommend consider repeat MBS after Pt's mentation has improved, subjective upgrade will be challenging secondary to multiple episodes of silent aspiration. ST  will continue to follow acutely SLP Visit Diagnosis Dysphagia, unspecified (R13.10) Attention and concentration deficit following -- Frontal lobe and executive function deficit following -- Impact on safety and function Mild aspiration risk;Moderate aspiration risk   CHL IP TREATMENT RECOMMENDATION 04/23/2020 Treatment Recommendations Therapy as outlined in treatment plan below   Prognosis 04/23/2020 Prognosis for Safe Diet Advancement Fair Barriers to Reach Goals Cognitive deficits;Severity of deficits Barriers/Prognosis Comment -- CHL IP DIET RECOMMENDATION 04/23/2020 SLP Diet Recommendations Dysphagia 2 (Fine chop) solids;Honey thick liquids Liquid Administration via Spoon Medication Administration Whole meds with puree Compensations Minimize environmental distractions;Slow rate;Small sips/bites;Monitor for anterior loss;Multiple dry swallows after each bite/sip;Clear throat after each swallow;Clear throat intermittently Postural Changes Remain semi-upright after after feeds/meals (Comment);Seated upright at 90 degrees   CHL IP OTHER RECOMMENDATIONS 04/23/2020 Recommended Consults -- Oral Care Recommendations Oral care BID Other Recommendations Order thickener from pharmacy   CHL IP FOLLOW UP RECOMMENDATIONS 04/23/2020 Follow up Recommendations 24 hour supervision/assistance;Skilled Nursing facility   [facility] IP FREQUENCY AND DURATION 04/23/2020 Speech Therapy Frequency (ACUTE ONLY) min 1 x/week Treatment Duration 1 week      CHL IP ORAL PHASE 04/23/2020 Oral Phase Impaired Oral - Pudding Teaspoon -- Oral - Pudding Cup -- Oral - Honey Teaspoon Left anterior bolus loss;Right anterior bolus loss;Impaired mastication;Weak lingual manipulation;Lingual pumping;Reduced posterior propulsion;Holding of bolus;Lingual/palatal residue;Piecemeal swallowing;Delayed oral transit;Decreased bolus cohesion;Premature spillage Oral - Honey Cup Left anterior bolus loss;Right anterior bolus loss;Impaired mastication;Weak lingual  manipulation;Lingual pumping;Reduced posterior propulsion;Holding of bolus;Lingual/palatal residue;Piecemeal swallowing;Delayed oral transit;Decreased bolus cohesion;Premature spillage Oral - Nectar Teaspoon Left anterior bolus loss;Right anterior bolus loss;Impaired mastication;Weak lingual manipulation;Lingual pumping;Reduced posterior propulsion;Holding of bolus;Lingual/palatal residue;Piecemeal swallowing;Delayed oral transit;Decreased bolus cohesion;Premature spillage Oral - Nectar Cup Left anterior bolus loss;Right anterior bolus loss;Impaired mastication;Weak lingual manipulation;Lingual pumping;Reduced posterior propulsion;Holding of bolus;Lingual/palatal residue;Piecemeal swallowing;Delayed oral transit;Decreased bolus cohesion;Premature spillage Oral - Nectar Straw Left anterior bolus loss;Right anterior bolus loss;Impaired mastication;Weak lingual manipulation;Lingual pumping;Reduced posterior propulsion;Holding of bolus;Lingual/palatal residue;Piecemeal swallowing;Delayed oral transit;Decreased bolus cohesion;Premature spillage Oral - Thin Teaspoon Left anterior bolus loss;Right anterior bolus loss;Impaired mastication;Weak lingual manipulation;Lingual pumping;Reduced posterior propulsion;Holding of bolus;Lingual/palatal residue;Piecemeal swallowing;Delayed oral transit;Decreased bolus cohesion;Premature spillage Oral - Thin Cup Left anterior bolus loss;Right anterior bolus loss;Impaired mastication;Weak lingual manipulation;Lingual pumping;Reduced posterior propulsion;Holding of bolus;Lingual/palatal  residue;Piecemeal swallowing;Delayed oral transit;Decreased bolus cohesion;Premature spillage Oral - Thin Straw -- Oral - Puree Left anterior bolus loss;Right anterior bolus loss;Impaired mastication;Weak lingual manipulation;Lingual pumping;Reduced posterior propulsion;Holding of bolus;Lingual/palatal residue;Piecemeal swallowing;Delayed oral transit;Decreased bolus cohesion;Premature spillage Oral - Mech  Soft -- Oral - Regular Left anterior bolus loss;Right anterior bolus loss;Impaired mastication;Weak lingual manipulation;Lingual pumping;Reduced posterior propulsion;Holding of bolus;Lingual/palatal residue;Piecemeal swallowing;Delayed oral transit;Decreased bolus cohesion;Premature spillage Oral - Multi-Consistency -- Oral - Pill -- Oral Phase - Comment --  CHL IP PHARYNGEAL PHASE 04/23/2020 Pharyngeal Phase Impaired Pharyngeal- Pudding Teaspoon -- Pharyngeal -- Pharyngeal- Pudding Cup -- Pharyngeal -- Pharyngeal- Honey Teaspoon Delayed swallow initiation-vallecula;Delayed swallow initiation-pyriform sinuses;Reduced pharyngeal peristalsis;Reduced airway/laryngeal closure;Penetration/Aspiration during swallow;Penetration/Apiration after swallow;Moderate aspiration;Pharyngeal residue - valleculae;Pharyngeal residue - pyriform;Lateral channel residue Pharyngeal Material enters airway, passes BELOW cords without attempt by patient to eject out (silent aspiration) Pharyngeal- Honey Cup Delayed swallow initiation-vallecula;Delayed swallow initiation-pyriform sinuses;Reduced pharyngeal peristalsis;Reduced airway/laryngeal closure;Penetration/Aspiration during swallow;Penetration/Apiration after swallow;Moderate aspiration;Pharyngeal residue - valleculae;Pharyngeal residue - pyriform;Lateral channel residue Pharyngeal Material enters airway, passes BELOW cords without attempt by patient to eject out (silent aspiration) Pharyngeal- Nectar Teaspoon -- Pharyngeal -- Pharyngeal- Nectar Cup Delayed swallow initiation-vallecula;Delayed swallow initiation-pyriform sinuses;Reduced pharyngeal peristalsis;Reduced airway/laryngeal closure;Penetration/Aspiration during swallow;Penetration/Apiration after swallow;Moderate aspiration;Pharyngeal residue - valleculae;Pharyngeal residue - pyriform;Lateral channel residue Pharyngeal Material enters airway, passes BELOW cords without attempt by patient to eject out (silent aspiration)  Pharyngeal- Nectar Straw Delayed swallow initiation-vallecula;Delayed swallow initiation-pyriform sinuses;Reduced pharyngeal peristalsis;Reduced airway/laryngeal closure;Penetration/Aspiration during swallow;Penetration/Apiration after swallow;Moderate aspiration;Pharyngeal residue - valleculae;Pharyngeal residue - pyriform;Lateral channel residue Pharyngeal Material enters airway, passes BELOW cords without attempt by patient to eject out (silent aspiration) Pharyngeal- Thin Teaspoon Delayed swallow initiation-vallecula;Delayed swallow initiation-pyriform sinuses;Reduced pharyngeal peristalsis;Reduced airway/laryngeal closure;Penetration/Aspiration during swallow;Penetration/Apiration after swallow;Moderate aspiration;Pharyngeal residue - valleculae;Pharyngeal residue - pyriform;Lateral channel residue Pharyngeal Material enters airway, passes BELOW cords without attempt by patient to eject out (silent aspiration) Pharyngeal- Thin Cup Delayed swallow initiation-vallecula;Delayed swallow initiation-pyriform sinuses;Reduced pharyngeal peristalsis;Reduced airway/laryngeal closure;Penetration/Aspiration during swallow;Penetration/Apiration after swallow;Moderate aspiration;Pharyngeal residue - valleculae;Pharyngeal residue - pyriform;Lateral channel residue Pharyngeal Material enters airway, passes BELOW cords without attempt by patient to eject out (silent aspiration) Pharyngeal- Thin Straw NT Pharyngeal -- Pharyngeal- Puree Delayed swallow initiation-vallecula Pharyngeal -- Pharyngeal- Mechanical Soft -- Pharyngeal -- Pharyngeal- Regular Delayed swallow initiation-vallecula Pharyngeal -- Pharyngeal- Multi-consistency -- Pharyngeal -- Pharyngeal- Pill -- Pharyngeal -- Pharyngeal Comment pronounce cricopharyngeus  No flowsheet data found. Amelia H. Roddie Mc, CCC-SLP Speech Language Pathologist Wende Bushy 04/23/2020, 3:34 PM              [place] EKG SITE RITE  Result Date: 04/18/2020 If Site Rite image not  attached, placement could not be confirmed due to current cardiac rhythm.    CBC Recent Labs  Lab 04/20/20 0821  WBC 6.3  HGB 9.6*  HCT 31.3*  PLT 291  MCV 85.8  MCH 26.3  MCHC 30.7  RDW 14.9    Chemistries  Recent Labs  Lab 04/20/20 0821 04/21/20 0625 04/22/20 0701 04/23/20 0436 04/24/20 0900 04/25/20 0625  NA 140 140 144  --  146* 144  K 3.0* 2.6* 3.5  --  2.8* 3.3*  CL 104 104 108  --  106 106  CO2 28 28 28   --  29 26  GLUCOSE 102* 100* 106*  --  113* 112*  BUN 19 18 17   --  30* 32*  CREATININE 1.28* 1.21* 1.18*  --  1.46* 1.35*  CALCIUM 12.3* 12.3* 12.5*  --  12.8* 11.0*  MG  --  1.5*  --  2.0 1.9  --   AST  --   --   --   --  32  --   ALT  --   --   --   --  27  --   ALKPHOS  --   --   --   --  104  --   BILITOT  --   --   --   --  0.5  --    ------------------------------------------------------------------------------------------------------------------ No results for input(s): CHOL, HDL, LDLCALC, TRIG, CHOLHDL, LDLDIRECT in the last 72 hours.  No results found for: HGBA1C ------------------------------------------------------------------------------------------------------------------ No results for input(s): TSH, T4TOTAL, T3FREE, THYROIDAB in the last 72 hours.  Invalid input(s): FREET3 ------------------------------------------------------------------------------------------------------------------ No results for input(s): VITAMINB12, FOLATE, FERRITIN, TIBC, IRON, RETICCTPCT in the last 72 hours.  Coagulation profile No results for input(s): INR, PROTIME in the last 168 hours.  No results for input(s): DDIMER in the last 72 hours.  Cardiac Enzymes No results for input(s): CKMB, TROPONINI, MYOGLOBIN in the last 168 hours.  Invalid input(s): CK ------------------------------------------------------------------------------------------------------------------    Component Value Date/Time   BNP 21.0 02/21/2020 1614   Haylee Mcanany M.D on  04/25/2020 at 2:34 PM  Go to [email or web address] - for contact info How to contact the [facility] Attending or Consulting provider [place] or covering provider during after hours [place], for this patient?  1. Check the care team in [facility] and look for a) attending/consulting TR[facility] provider listed and b) the [facility] team listed 2. Log into [email or web address] and use [facility]'s universal password to access. If you do not have the password, please contact the hospital operator. 3. Locate the [facility] provider you are looking for under [facility] and page to a number that you can be directly reached. 4. If you still have difficulty reaching the provider, please page the [facility] (Director on Call) for the Hospitalists listed on amion for assistance.

## 2020-04-25 NOTE — Consult Note (Signed)
Consultation Note Date: 04/25/2020   Patient Name: Kristie Morris  DOB: 1925/09/21  MRN: [id number]  Age / Sex: 86 y.o., female  PCP: Kristie Mayo, NP Referring Physician: Murlean Iba, MD  Reason for Consultation: Establishing goals of care  HPI/Patient Profile: 86 y.o. female  with past medical history of CHF, COPD, CVA with residual L hemiparesis, breast cancer s/p mastectomy, recent hospitalization in [place] for U[place] admitted on 04/17/2020 with altered mental status. Workup reveals bacteremia presumed to be related to UTI. Further workup shows hypercalcemia, diffuse lymphadenopathy concerning for malignancy. Mental status not clearing. Dysphagia, poor po intake. Palliative medicine consulted for goals of care.    Clinical Assessment and Goals of Care:  I have reviewed medical records including EPIC notes, labs and imaging, received report from Dr. Wynetta Emery  examined the patient and spoke with patient's HCPOA- Kristie Morris and his sister- Kristie Morris to discuss diagnosis prognosis, [place], EOL wishes, disposition and options.  We discussed a brief life review of the patient. Kristie Morris is from [place], went to a boarding school in [place]. Met her late husband Kristie Morris who was in the [place] and then relocated to [place]. 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 She was known to be hard working, intelligent, "the [place] woman in the room". Very independent. Sadly, she had one daughter, Kristie Morris who died young from a brain tumor. She has a living son, Kristie Morris, who is uninvolved in her care. Her primary caretakers are her Kristie Morris (also HCPOA) and his spouse, Kristie Morris.  As far as functional and nutritional status- prior to admission she was wheelchair bound. Living bed to chair. She had decreased po intake. Required assistance with all ADL's. There had been noticeable decline since her fall in May  and even more decline since her hospitalization in [place] in late June.   We discussed her current illness and what it means in the larger context of her on-going co-morbidities.  Natural disease trajectory and expectations at EOL were discussed. She has stated she would want to die at home.   The difference between aggressive medical intervention and comfort care was considered in light of the patient's goals of care.   Advanced directives, concepts specific to code status, artifical feeding and hydration, and rehospitalization were considered and discussed. Strong recommendation for DNR status was made as CPR would be traumatic and would not achieve any outcome that would result in patient being in more functional state than she is in currently.  Hospice and Palliative Care services outpatient were explained and offered.  Questions and concerns were addressed.  The family was encouraged to call with questions or concerns.   Primary Decision Maker HCPOA- Kristie Morris, with support of his sister- Kristie Morris    SUMMARY OF RECOMMENDATIONS -Kristie Morris and Kristie Morris to discuss option of transition to full comfort and taking Kristie Morris home with Hospice- they would like to continue attempts to correct calcium through the weekend in efforts to clear mental status even if only temporarily -They  are considering transition to DNR code status and will notify medical team tomorrow of their decision -PMT will follow up with Kristie Morris and Kristie Morris on Monday- planned conference call for 10am- have also discussed with them that if they choose to change path of care to more comfort focused prior to then they can alert attending team -Kristie Morris inquired re: restarting patient's home duloxetine- I have restarted home dose- 75m duloxetine daily po -Oral candidiasis- tongue red with white patches, has had multiple rounds of antibiotics- magic mouthwash PO TID    Code Status/Advance Care Planning:  Full  code  Prognosis:    Unable to determine  Discharge Planning: To Be Determined  Primary Diagnoses: Present on Admission:  Acute metabolic encephalopathy  COPD (chronic obstructive pulmonary disease) (HCC)  Asthma, chronic  Essential hypertension  Bacteremia due to Gram-positive bacteria  Bacteremia  Hypercalcemia   I have reviewed the medical record, interviewed the patient and family, and examined the patient. The following aspects are pertinent.  Past Medical History:  Diagnosis Date   Anemia 02/02/2014   Anxiety    Arthritis    knee   Asthma    CAD (coronary artery disease) cardiologist-  dr kBronson Ing ([facility] [place]   [place]  Cath with nonobstructive CAD   Chest pain 08/14/2017   Chronic headache    Chronic lower back pain    COPD (chronic obstructive pulmonary disease) (HCC)    Diastolic CHF, chronic (HCC)    Duodenal diverticulum    Edema of left lower extremity    Fibromyalgia    Full dentures    Gait disorder    uses walker   GERD (gastroesophageal reflux disease)    Headache disorder 01/17/2015   Heart murmur    History of breast cancer    History of Clostridium difficile    History of colitis    2001   History of CVA with residual deficit    06/ 2015  residual left hemiparesis   History of esophageal dilatation    History of falling    last fall 06-25-2015   History of hiatal hernia    History of small bowel obstruction    04-25-2015  resolved without surgical intervention   Lesion of bladder    Low vision, one eye    right eye   Macular degeneration of both eyes    right eye now low vision/  left eye getting injections currently   Malignant hypertension    OAB (overactive bladder)    Protein-calorie malnutrition, severe ([place] 01/10/2015   SBO (small bowel obstruction) ([place] 04/22/2015   Schatzki's ring    Sciatica of right side 05/29/2015   Syncope 08/14/2017   Urge and stress  incontinence    Varicose veins    Venous insufficiency, peripheral    Vertigo    Social History   Socioeconomic History   Marital status: Widowed    Spouse name: Not on file   Number of children: 2   Years of education: 181  Highest education level: Not on file  Occupational History   Occupation: RETIRED   Occupation: ACCOUNTANT  Tobacco Use   Smoking status: Former Smoker    Packs/day: 0.50    Years: 15.00    Pack years: 7.50    Types: Cigarettes    Quit date: 07/17/1987    Years since quitting: 32.7   Smokeless tobacco: Never Used  Vaping Use   Vaping Use: Never used  Substance and Sexual Activity  Alcohol use: Yes    Alcohol/week: 1.0 standard drink    Types: 1 Glasses of wine per week    Comment: rarely   Drug use: No   Sexual activity: Not on file  Other Topics Concern   Not on file  Social History Narrative   Grandson and Granddaughter In Law lives with her   Right handed      3 cats       Enjoy: mysteries- Ms Risk manager,  And spending time with cats      Diet: at least 2 meals a day: eats all food groups   Caffeine: 1/2 caf coffee   Water: 4-6 cups daily (does add flavors in)      Wears seat belt    Smoke detectors    Social Determinants of Health   Financial Resource Strain:    Difficulty of Paying Living Expenses:   Food Insecurity:    Worried About Charity fundraiser in the Last Year:    Arboriculturist in the Last Year:   Transportation Needs: No Transportation Needs   Lack of Transportation (Medical): No   Lack of Transportation (Non-Medical): No  Physical Activity:    Days of Exercise per Week:    Minutes of Exercise per Session:   Stress:    Feeling of Stress :   Social Connections: Moderately Isolated   Frequency of Communication with Friends and Family: Twice a week   Frequency of Social Gatherings with Friends and Family: Once a week   Attends Religious Services: Never   Marine scientist or  Organizations: No   Attends Music therapist: 1 to 4 times per year   Marital Status: Widowed   Family History  Problem Relation Age of Onset   Heart attack Mother    CAD Mother    Polycythemia Mother    Heart attack Sister    Cancer - Ovarian Sister    Cancer - Colon Father    Macular degeneration Sister    Thyroid disease Daughter    Rheumatic fever Daughter    Scheduled Meds:  amLODipine  5 mg Oral Daily   aspirin EC  81 mg Oral Daily   chlorhexidine  15 mL Mouth Rinse BID   DULoxetine  60 mg Oral Daily   heparin  5,000 Units Subcutaneous Q8H   magic mouthwash  10 mL Oral TID   mouth rinse  15 mL Mouth Rinse q12n4p   oxybutynin  10 mg Oral QHS   pantoprazole  40 mg Oral Q1500   rosuvastatin  5 mg Oral QPM   sodium chloride flush  10-40 mL Intracatheter Q12H   traZODone  50 mg Oral QHS   Continuous Infusions:  0.45 % NaCl with KCl 20 mEq / L 70 mL/hr at 04/25/20 0532   PRN Meds:.acetaminophen **OR** acetaminophen, albuterol, labetalol, ondansetron **OR** ondansetron (ZOFRAN) IV, polyethylene glycol, sodium chloride flush Medications Prior to Admission:  Prior to Admission medications   Medication Sig Start Date End Date Taking? Authorizing Provider  acetaminophen (TYLENOL) 500 MG tablet Take 1,000 mg daily as needed by mouth for mild pain or moderate pain.   Yes [provider]  albuterol (VENTOLIN HFA) 108 (90 Base) MCG/ACT inhaler Inhale 2 puffs into the lungs every 6 (six) hours as needed for wheezing or shortness of breath. 01/16/20  Yes Kristie Mayo, NP  amLODipine (NORVASC) 10 MG tablet Take 10 mg by mouth in the morning.  09/18/19  Yes  [provider]  aspirin EC 81 MG tablet Take 81 mg by mouth in the morning.    Yes [provider]  Bacillus Coagulans-Inulin (PROBIOTIC FORMULA PO) Take 1 capsule by mouth in the morning and at bedtime.   Yes [provider]  clotrimazole-betamethasone  (LOTRISONE) cream Apply 1 application topically 2 (two) times daily. Patient taking differently: Apply 1 application topically daily as needed (under breast/inner thigh areas).  03/18/20  Yes Kristie Mayo, NP  DULoxetine (CYMBALTA) 30 MG capsule Take 30 mg by mouth in the morning.  09/18/19  Yes [provider]  DULoxetine (CYMBALTA) 60 MG capsule Take 1 capsule (60 mg total) by mouth daily. Patient taking differently: Take 60 mg by mouth in the morning.  01/22/20  Yes Kristie Mayo, NP  gabapentin (NEURONTIN) 300 MG capsule Take 300 mg by mouth 3 (three) times daily. 03/02/20  Yes [provider]  meloxicam (MOBIC) 7.5 MG tablet Take 1 tablet (7.5 mg total) by mouth 2 (two) times daily. 02/14/20  Yes Kristie Mayo, NP  nitroGLYCERIN (NITROSTAT) 0.4 MG SL tablet Place 0.4 mg every 5 (five) minutes as needed under the tongue.  09/10/15  Yes [provider]  oxybutynin (DITROPAN) 5 MG tablet Take 1 tablet (5 mg total) by mouth 2 (two) times daily. Patient taking differently: Take 10 mg by mouth at bedtime.  02/14/20  Yes Kristie Mayo, NP  pantoprazole (PROTONIX) 40 MG tablet Take 1 tablet (40 mg total) by mouth daily. Patient taking differently: Take 40 mg by mouth daily in the afternoon.  12/11/19  Yes Kristie Mayo, NP  polyethylene glycol powder (GLYCOLAX/MIRALAX) powder Take 17 g daily as needed by mouth for mild constipation.   Yes [provider]  rosuvastatin (CRESTOR) 5 MG tablet TAKE 1 TABLET BY MOUTH ONCE DAILY. Patient taking differently: Take 5 mg by mouth every evening.  02/05/20  Yes Kristie Mayo, NP  HYDROcodone-acetaminophen (NORCO/VICODIN) 5-325 MG tablet Take 1 tablet by mouth every 4 (four) hours as needed. Patient not taking: Reported on 04/17/2020 02/21/20   Dorie Rank, MD  traZODone (DESYREL) 50 MG tablet Take 50 mg by mouth at bedtime. Patient not taking: Reported on 04/17/2020 09/18/19   [provider]   Allergies  Allergen  Reactions   Zithromax [Azithromycin] Diarrhea    Pt in hosp for 2 weeks    Celebrex [Celecoxib] Hives   Ciprofloxacin Hives   Metronidazole Hives   Morphine And Related Rash   Moxifloxacin Hives   Penicillins Hives   Polymyxin B Hives   Sulfa Antibiotics Hives   Vancomycin Hives   Vioxx [Rofecoxib] Hives   Review of Systems  Unable to perform ROS: Mental status change    Physical Exam Vitals and nursing note reviewed.  Constitutional:      Comments: lethargic  HENT:     Mouth/Throat:     Mouth: Mucous membranes are dry.     Pharynx: Posterior oropharyngeal erythema present.     Comments: White patches on tongue, tongue red Pulmonary:     Effort: Pulmonary effort is normal.  Neurological:     Mental Status: She is disoriented.     Comments: Follows limited commands     Vital Signs: BP (!) 154/81 (BP Location: Right Leg)    Pulse 89    Temp 98.7 F (37.1 C) (Oral)    Resp 18    Ht _0  (1.626 m)    Wt 72.3 kg  SpO2 100%    BMI 27.36 kg/m  Pain Scale: 0-10   Pain Score: 0-No pain   SpO2: SpO2: 100 % O2 Device:SpO2: 100 % O2 Flow Rate: .O2 Flow Rate (L/min): 2.5 L/min  IO: Intake/output summary:   Intake/Output Summary (Last 24 hours) at 04/25/2020 1634 Last data filed at 04/25/2020 1300 Gross per 24 hour  Intake 528.66 ml  Output 650 ml  Net -121.34 ml    LBM: Last BM Date: 04/19/20 Baseline Weight: Weight: 83 kg Most recent weight: Weight: 72.3 kg     Palliative Assessment/Data: PPS: 20%   Flowsheet Rows     Most Recent Value  Intake Tab  Referral Department Hospitalist  Unit at Time of Referral Med/Surg Unit  Date Notified 04/18/20  Palliative Care Type New Palliative care  Reason for referral Clarify Goals of Care  Date of Admission 04/17/20  # of days IP prior to Palliative referral 1  Clinical Assessment  Psychosocial & Spiritual Assessment  Palliative Care Outcomes      Thank you for this consult. Palliative medicine will  continue to follow and assist as needed.   Time In: 1500 Time Out: 1630 Time Total: 90 minutes Greater than 50%  of this time was spent counseling and coordinating care related to the above assessment and plan.  Signed by: Mariana Kaufman, AGNP-C Palliative Medicine    Please contact Palliative Medicine Team phone at [phone] for questions and concerns.  For individual provider: See Shea Evans

## 2020-04-26 LAB — BASIC METABOLIC PANEL
Anion gap: 9 (ref 5–15)
BUN: 31 mg/dL — ABNORMAL HIGH (ref 8–23)
CO2: 25 mmol/L (ref 22–32)
Calcium: 9.8 mg/dL (ref 8.9–10.3)
Chloride: 109 mmol/L (ref 98–111)
Creatinine, Ser: 1.1 mg/dL — ABNORMAL HIGH (ref 0.44–1.00)
GFR calc Af Amer: 50 mL/min — ABNORMAL LOW (ref >60)
GFR calc non Af Amer: 43 mL/min — ABNORMAL LOW (ref >60)
Glucose, Bld: 102 mg/dL — ABNORMAL HIGH (ref 70–99)
Potassium: 3.3 mmol/L — ABNORMAL LOW (ref 3.5–5.1)
Sodium: 143 mmol/L (ref 135–145)

## 2020-04-26 LAB — MAGNESIUM: Magnesium: 1.6 mg/dL — ABNORMAL LOW (ref 1.7–2.4)

## 2020-04-26 MED ORDER — MAGNESIUM SULFATE 2 GM/50ML IV SOLN
2.0000 g | Freq: Once | INTRAVENOUS | Status: AC
  Administered 2020-04-26: 2 g via INTRAVENOUS
  Filled 2020-04-26: qty 50

## 2020-04-26 MED ORDER — POTASSIUM CHLORIDE 10 MEQ/100ML IV SOLN
10.0000 meq | INTRAVENOUS | Status: AC
  Administered 2020-04-26 (×4): 10 meq via INTRAVENOUS
  Filled 2020-04-26 (×4): qty 100

## 2020-04-26 NOTE — NC FL2 (Signed)
[place] MEDICAID FL2 LEVEL OF CARE SCREENING TOOL     IDENTIFICATION  Patient Name: Kristie Morris Birthdate: 18-Mar-1925 Sex: female Admission Date (Current Location): 04/17/2020  [facility] and [place] Number:  [employer] and Address:  [place] [address], [place]      Provider Number: [id number]  Attending Physician Name and Address:  Murlean Iba, MD  Relative Name and Phone Number:  Kandyce Rud [phone]    Current Level of Care: Hospital Recommended Level of Care: [place] Prior Approval Number: [id number]  Date Approved/Denied:   PASRR Number:    Discharge Plan: SNF    Current Diagnoses: Patient Active Problem List   Diagnosis Date Noted   Frailty    Advanced care planning/counseling discussion    Goals of care, counseling/discussion    Lymphadenopathy    Palliative care by specialist    Oral candida    Hypercalcemia 04/24/2020   Confusion 04/24/2020   Bacteremia due to Gram-positive bacteria 04/18/2020   Bacteremia 10/62/6948   Acute metabolic encephalopathy 54/62/7035   Fall 02/22/2020   Primary osteoarthritis 01/17/2020   Post-menopausal 10/24/2019   Wheelchair bound 10/24/2019   Overactive bladder 10/17/2019   Sciatica of right side 05/29/2015   Essential hypertension 04/27/2015   Personal history of stroke with current residual effects 04/27/2015   Abnormality of gait 01/08/2015   Recurrent falls 01/08/2015   Pre-syncope 01/08/2015   Left-sided weakness 00/93/8182   Diastolic dysfunction 99/37/1696   Asthma, chronic 02/02/2014   COPD (chronic obstructive pulmonary disease) ([place]) 02/02/2014   Hyperlipidemia 02/02/2014    Orientation RESPIRATION BLADDER Height & Weight     Self  Normal External catheter Weight: 159 lb 6.3 oz (72.3 kg) Height:  5\' 4"  (162.6 cm)  BEHAVIORAL SYMPTOMS/MOOD NEUROLOGICAL BOWEL NUTRITION STATUS      Continent Diet  (Honey thick liquids by teaspoon with a chin tuck. DYS 2.)  AMBULATORY STATUS COMMUNICATION OF NEEDS Skin   Extensive Assist Verbally Normal                       Personal Care Assistance Level of Assistance  Bathing, Dressing, Feeding Bathing Assistance: Maximum assistance Feeding assistance: Limited assistance Dressing Assistance: Maximum assistance     Functional Limitations Info  Sight, Hearing, Speech Sight Info: Impaired Hearing Info: Adequate Speech Info: Adequate    SPECIAL CARE FACTORS FREQUENCY  PT (By licensed PT)     PT Frequency: 5x              Contractures Contractures Info: Not present    Additional Factors Info  Code Status, Allergies Code Status Info: Full Allergies Info: Zithromax, Celebrex, Ciprofloxacin, Metronidazole, Morphine And Related, Moxifloxacin, Penicillins,Polymyxin B, Sulfa Antibiotics, Vancomycin, Vioxx           Current Medications (04/26/2020):  This is the current hospital active medication list Current Facility-Administered Medications  Medication Dose Route Frequency Provider Last Rate Last Admin   0.45 % NaCl with KCl 20 mEq / L infusion   Intravenous Continuous Wynetta Emery, Clanford L, MD 50 mL/hr at 04/25/20 2059 New Bag at 04/25/20 2059   acetaminophen (TYLENOL) tablet 650 mg  650 mg Oral Q6H PRN Emokpae, Ejiroghene E, MD   650 mg at 04/25/20 1040   Or   acetaminophen (TYLENOL) suppository 650 mg  650 mg Rectal Q6H PRN Emokpae, Ejiroghene E, MD       albuterol (PROVENTIL) (2.5 MG/3ML) 0.083% nebulizer solution 2.5  mg  2.5 mg Nebulization Q4H PRN Emokpae, Ejiroghene E, MD       amLODipine (NORVASC) tablet 5 mg  5 mg Oral Daily Emokpae, Ejiroghene E, MD   5 mg at 04/26/20 9417   aspirin EC tablet 81 mg  81 mg Oral Daily Emokpae, Ejiroghene E, MD   81 mg at 04/26/20 0927   chlorhexidine (PERIDEX) 0.12 % solution 15 mL  15 mL Mouth Rinse BID Emokpae, Courage, MD   15 mL at 04/26/20 0927   DULoxetine (CYMBALTA) DR capsule  60 mg  60 mg Oral Daily Earlie Counts, NP   60 mg at 04/26/20 0931   heparin injection 5,000 Units  5,000 Units Subcutaneous Q8H Emokpae, Ejiroghene E, MD   5,000 Units at 04/26/20 1246   labetalol (NORMODYNE) injection 10 mg  10 mg Intravenous Q4H PRN Emokpae, Courage, MD       magic mouthwash  10 mL Oral TID Earlie Counts, NP   10 mL at 04/26/20 0927   MEDLINE mouth rinse  15 mL Mouth Rinse q12n4p Emokpae, Courage, MD   15 mL at 04/25/20 1656   ondansetron (ZOFRAN) tablet 4 mg  4 mg Oral Q6H PRN Emokpae, Ejiroghene E, MD       Or   ondansetron (ZOFRAN) injection 4 mg  4 mg Intravenous Q6H PRN Emokpae, Ejiroghene E, MD       oxybutynin (DITROPAN) tablet 10 mg  10 mg Oral QHS Emokpae, Ejiroghene E, MD   10 mg at 04/25/20 2104   pantoprazole (PROTONIX) EC tablet 40 mg  40 mg Oral Q1500 Emokpae, Ejiroghene E, MD   40 mg at 04/25/20 1650   polyethylene glycol (MIRALAX / GLYCOLAX) packet 17 g  17 g Oral Daily PRN Emokpae, Ejiroghene E, MD       rosuvastatin (CRESTOR) tablet 5 mg  5 mg Oral QPM Emokpae, Ejiroghene E, MD   5 mg at 04/25/20 1657   sodium chloride flush (NS) 0.9 % injection 10-40 mL  10-40 mL Intracatheter Q12H Emokpae, Courage, MD   10 mL at 04/26/20 0929   sodium chloride flush (NS) 0.9 % injection 10-40 mL  10-40 mL Intracatheter PRN Emokpae, Courage, MD       traZODone (DESYREL) tablet 50 mg  50 mg Oral QHS Roxan Hockey, MD   50 mg at 04/25/20 2104     Discharge Medications: Please see discharge summary for a list of discharge medications.  Relevant Imaging Results:  Relevant Lab Results:   Additional Information SSN [id number] Clydene Pugh, LCSW

## 2020-04-26 NOTE — Plan of Care (Signed)
  Problem: Education: Goal: Knowledge of General Education information will improve Description Including pain rating scale, medication(s)/side effects and non-pharmacologic comfort measures Outcome: Progressing   Problem: Health Behavior/Discharge Planning: Goal: Ability to manage health-related needs will improve Outcome: Progressing   

## 2020-04-26 NOTE — Progress Notes (Signed)
  Speech Language Pathology Treatment: Dysphagia  Patient Details Name: Kristie Morris MRN: [id number] DOB: [birth year] Today's Date: 04/26/2020 Time: 2035-5974 SLP Time Calculation (min) (ACUTE ONLY): 17 min  Assessment / Plan / Recommendation Clinical Impression  Ongoing diagnostic dysphagia therapy targeting trials of HTL; Pt was pleasantly confused and participated in [place] treatment. Inconsistent and occasional wet vocal quality was noted with trials of HTL by tsp provided. Pt with difficulty following cues to clear her throat or cough to clear questionable residuals. Echo recommendations from [place] treatment yesterday: Pt will need 100% feeder assist and will need assist with oral care (significant xerostomia). Please offer po frequently and ice chips after oral care for comfort. SLP will follow.    HPI HPI: Kristie Morris. [name] is a 86 y.o. female with medical history significant for diastolic CHF, COPD, asthma, hypertension, CVA with residual left hemiparesis admitted on 04/17/20 with altered mentation/disorientation in the setting of presumed UTI and GPC bacteremia altered mentation and disorientation in the setting of presumed UTI and GPC bacteremia.Pt was placed on D3 and NTL on 7/16; MBS ordered to objectively assess the swallowing function.      SLP Plan  Continue with current plan of care       Recommendations  Diet recommendations: Dysphagia 1 (puree);Honey-thick liquid Liquids provided via: Teaspoon Medication Administration: Crushed with puree Supervision: Staff to assist with self feeding Compensations: Minimize environmental distractions;Slow rate;Small sips/bites;Monitor for anterior loss;Multiple dry swallows after each bite/sip;Clear throat after each swallow;Clear throat intermittently Postural Changes and/or Swallow Maneuvers: Seated upright 90 degrees;Upright 30-60 min after meal                Oral Care Recommendations: Oral care prior to ice chip/H20 Follow up  Recommendations: 24 hour supervision/assistance;Skilled Nursing facility SLP Visit Diagnosis: Dysphagia, unspecified (R13.10) Plan: Continue with current plan of care       Freada Twersky H. Roddie Mc, CCC-SLP Speech Language Pathologist    Wende Bushy 04/26/2020, 8:08 AM

## 2020-04-26 NOTE — Progress Notes (Signed)
Physical Therapy Treatment Patient Details Name: Kristie Morris MRN: [id number] DOB: Sep 12, 1925 Today's Date: 04/26/2020    History of Present Illness Kristie Morris is a 86 y.o. female with medical history significant for diastolic CHF, COPD, asthma, hypertension, CVA with residual left hemiparesis.Patient was brought to the ED with reports of confusion that started last night.  History is obtained from patient's granddaughter in law who is at bedside and takes care of patient, a limited history from patient herself.  On time of evaluation patient is awake alert oriented to person and place, she is able to answer simple questions but not give me a lot of detail.Patient also has been generally weak, with poor p.o. intake since yesterday.  Patient's urine was also dark, family was concerned that patient had a UTI.  On questioning patient she denies dysuria.  She has a chronic unchanged cough, she denies difficulty breathing.  No chest pain.  Patient also reports dizziness when standing over the past 2 weeks that is getting worse.At baseline patient ambulates with a wheelchair, does not have memory issues/dementia.    PT Comments    Patient had difficulty maintaining sitting balance with frequent leaning falling to the right, able to prop self up on left elbow for up to 1-2 minutes before leaning to the right, unable to attempt sit to stands due generalized weakness and c/o fatigue.  Patient put back to bed after therapy.  Plan:  Patient will benefit from continued physical therapy in hospital and recommended venue below to increase strength, balance, endurance for safe ADLs and gait.    Follow Up Recommendations  SNF     Equipment Recommendations  None recommended by PT    Recommendations for Other Services       Precautions / Restrictions Precautions Precautions: Fall    Mobility  Bed Mobility Overal bed mobility: Needs Assistance Bed Mobility: Supine to Sit;Sit to Supine     Supine to  sit: Mod assist;Max assist Sit to supine: Max assist   General bed mobility comments: increased time, labored movement, frequent falling over to the right  Transfers                    Ambulation/Gait                 Stairs             Wheelchair Mobility    Modified Rankin (Stroke Patients Only)       Balance Overall balance assessment: Needs assistance Sitting-balance support: Feet supported;Bilateral upper extremity supported Sitting balance-Leahy Scale: Poor Sitting balance - Comments: poor seated at EOB Postural control: Right lateral lean                                  Cognition Arousal/Alertness: Awake/alert Behavior During Therapy: WFL for tasks assessed/performed Overall Cognitive Status: Within Functional Limits for tasks assessed                                        Exercises General Exercises - Lower Extremity Long Arc Quad: Seated;Strengthening;Both;10 reps;AAROM Hip Flexion/Marching: Seated;AAROM;Both;10 reps    General Comments        Pertinent Vitals/Pain Pain Assessment: Faces Faces Pain Scale: Hurts a little bit Pain Location: low back while seated at bedside Pain Descriptors / Indicators: Aching;Sore;Discomfort  Pain Intervention(s): Limited activity within patient's tolerance;Monitored during session;Repositioned    Home Living                      Prior Function            PT Goals (current goals can now be found in the care plan section) Acute Rehab PT Goals Patient Stated Goal: return home after rehab PT Goal Formulation: With patient Time For Goal Achievement: 05/02/20 Potential to Achieve Goals: Good Progress towards PT goals: Progressing toward goals    Frequency    Min 3X/week      PT Plan Current plan remains appropriate    Co-evaluation              AM-PAC PT "6 Clicks" Mobility   Outcome Measure  Help needed turning from your back to your  side while in a flat bed without using bedrails?: A Lot Help needed moving from lying on your back to sitting on the side of a flat bed without using bedrails?: A Lot Help needed moving to and from a bed to a chair (including a wheelchair)?: Total Help needed standing up from a chair using your arms (e.g., wheelchair or bedside chair)?: A Lot Help needed to walk in hospital room?: Total Help needed climbing 3-5 steps with a railing? : Total 6 Click Score: 9    End of Session Equipment Utilized During Treatment: Oxygen Activity Tolerance: Patient tolerated treatment well;Patient limited by fatigue Patient left: in bed;with call bell/phone within reach;with bed alarm set Nurse Communication: Mobility status PT Visit Diagnosis: Unsteadiness on feet (R26.81);Other abnormalities of gait and mobility (R26.89);Muscle weakness (generalized) (M62.81)     Time: 1093-2355 PT Time Calculation (min) (ACUTE ONLY): 30 min  Charges:  $Therapeutic Exercise: 8-22 mins $Therapeutic Activity: 8-22 mins                     2:48 PM, 04/26/20 Lonell Grandchild, MPT Physical Therapist with [facility] [place] [phone] office [phone] mobile phone

## 2020-04-26 NOTE — Progress Notes (Signed)
Patient Demographics:    Kristie Morris, is a 86 y.o. female, DOB - 1925-05-14, VEH:[id number]  Admit date - 04/17/2020   Admitting Physician [name], MD  Outpatient Primary MD for the patient is [name], NP  LOS - 8  Chief Complaint  Patient presents with  . possible UTI        Subjective:    Kristie Morris has improved alertness and mentation today and hypercalcemia has been corrected.   GOC--- severe dysphagia with aspiration with most consistencies, essentially wheelchair-bound status----- further conversations with family about realistic expectations and overall prognosis- --requesting palliative care to help with further conversations about advanced directives and kindly help further delineate goals of care and expectations   Assessment  & Plan :    Principal Problem:   Acute metabolic encephalopathy Active Problems:   Diastolic dysfunction   Asthma, chronic   COPD (chronic obstructive pulmonary disease) ([place])   Essential hypertension   Personal history of stroke with current residual effects   Wheelchair bound   Bacteremia due to Gram-positive bacteria   Bacteremia   Hypercalcemia   Confusion   Frailty   Advanced care planning/counseling discussion   Goals of care, counseling/discussion   Lymphadenopathy   Palliative care by specialist   Oral candida  Brief Summary: 86 y.o. female with medical history significant for diastolic CHF, COPD, asthma, hypertension, CVA with residual left hemiparesis admitted on 04/17/20 with altered mentation/disorientation in the setting of presumed UTI and GPC bacteremia altered mentation and disorientation likely secondary to hypercalcemia of malignancy.   A/p 1)Acute Metabolic Encephalopathy--Much improved to RESOLVED now.  Pt is alert and answering questions and more talkative today.  This was likely due to hypercalcemia that has now been  successfully treated.   Pt also has AKI and presumed UTI that has been treated.    She is not eating and drinking well and prognosis remains guarded.  --Anticipate improvement with improvement of listed conditions --As per family patient is still not back to baseline from a cognitive standpoint  Hypercalcemia (likely from malignancy)  - TREATED s/p zoledronic acid x 1 dose, plus calcitonin and IV fluids, Calcium down to 9.8 today, recheck in AM.  Unfortunately she likely has an occult malignancy a lymphoproliferative disorder.  I reviewed oncology recommendations with family.  Biopsy is not recommended given advanced age and comorbidities.  Palliative care was recommended and family verbalized understanding and is agreeable.  PTH is low at 14.    Hypernatremia - TREATED with free water.   2)CONS bacteremia--contaminants--- antibiotics discontinued-No fevers, no tachypnea, no tachycardia, no leukocytosis, lactic acid is not elevated---patient does Not meet sepsis criteria  3) Pseudomonas-UTI--- continue IV cefepime started on 04/19/2020 last dose 04/24/2020. TREATED.   4)AKI----IMPROVING.  Acute kidney injury due to dehydration and concomitant meloxicam use.  Had improved with hydration but since not eating and drinking she has developed a recurrence of AKI. IV fluids reordered 7/21.  Some improvement noted 7/22.  Continue IV fluids as she is not eating or drinking well.  -- creatinine on admission= 1.55,  baseline creatinine = 1.1 (10/27/19)   -Creatinine is down to 1.1 ---renally adjust medications, avoid nephrotoxic agents / dehydration  / hypotension   5)Poor Venous Access--limiting ability to  give IV fluids and IV antibiotics-had ultrasound assisted midline placement to the left arm  6) ambulatory dysfunction--- at baseline patient was able to stand and pivot but she gets around with a wheelchair she does not walk -PT eval appreciated, SNF recommended  -PT/OT evaluations performed. SNF Rehab  recommended. SNF appropriate as the patient has received 3 days of hospital care (or the 3 day period has been waived by the patient's insurance company) and is felt to need rehab services to restore this patient to their prior level of function to achieve safe transition back to home care. This patient needs rehab services for at least 5 days per week and skilled nursing services daily to facilitate this transition. Rehab is being requested as the most appropriate d/c option for this patient and is NOT felt to be for custodial care as evidenced by previously able to stand, pivot and help with transfers-- prior to admission (comment on the patient's prior level of function). -She was requiring assist of 1 with ADLs prior to admission  7)Prior history of stroke more than 5 years ago with residual left-sided hemiparesis--- please see #6 above -Continue aspirin and crestor for secondary stroke prophylaxis - 8)Depression--- stable continue Cymbalta, and trazodone  9)Dysphagia--speech and swallow evaluation appreciated, recommends dysphagia 3 (Mech soft);Nectar-thick liquid  --Did very very poorly with swallow eval/MBS on 04/23/2020 please see full report  10) hypokalemia/Hypomagnesiemia---replace and recheck  11)Social/Ethics---  GOC--- severe dysphagia with aspiration with most consistencies, essentially wheelchair-bound status----- further conversations with family about realistic expectations and overall prognosis- --requesting palliative care to help with further conversations about advanced directives and kindly help further delineate goals of care and expectations Pt is DNR and family considering home with hospice versus [facility] with hospice support.   Disposition/Need for in-Hospital Stay- patient unable to be discharged at this time due to [employer] approval received to go to SNF rehab, family undecided. Considering home with hospice.   Status is: Inpatient  Remains inpatient  appropriate because:Family deciding disposition, treate and working up hypercalcemia with IV zaledronic acid and calcitonin.  Ongoing goals of care discussions and oncology consult regarding hilar adenopathy.   Disposition: The patient is from: Home              Anticipated d/c is to: SNF vs home with hospice (family remains undecided)              Anticipated d/c date is: 1-2 days              Patient currently is not medically stable to d/c. Barriers: - - working with family to decide disposition, they promise to get back with me in next 1-2 days regarding home with hospice vs SNF with hospice.   Procedures:- --Swallowing difficulties persist, patient did poorly poorly with swallow eval/MBS on 04/23/20  Code Status : after discussion with grandson today and reviewing oncology recommendations, he is agreeable to DNR status. I am changing order today.  He is going to get back at me this weekend about his decision about going to [facility] with hosp[facility] versus home with hospice.    Family Communication: discussed with grandson on telephone 7/22, 7/23  Consults  : Palliative care  DVT Prophylaxis  :   - Heparin - SCDs   Lab Results  Component Value Date   PLT 291 04/20/2020   Inpatient Medications  Scheduled Meds: . amLODipine  5 mg Oral Daily  . aspirin EC  81 mg Oral Daily  .  chlorhexidine  15 mL Mouth Rinse BID  . DULoxetine  60 mg Oral Daily  . heparin  5,000 Units Subcutaneous Q8H  . magic mouthwash  10 mL Oral TID  . mouth rinse  15 mL Mouth Rinse q12n4p  . oxybutynin  10 mg Oral QHS  . pantoprazole  40 mg Oral Q1500  . rosuvastatin  5 mg Oral QPM  . sodium chloride flush  10-40 mL Intracatheter Q12H  . traZODone  50 mg Oral QHS   Continuous Infusions: . 0.45 % NaCl with KCl 20 mEq / L 50 mL/hr at 04/25/20 2059  . potassium chloride     PRN Meds:.acetaminophen **OR** acetaminophen, albuterol, labetalol, ondansetron **OR** ondansetron (ZOFRAN) IV, polyethylene glycol,  sodium chloride flush  Anti-infectives (From admission, onward)   Start     Dose/Rate Route Frequency Ordered Stop   04/19/20 2000  ceFEPIme (MAXIPIME) 1 g in sodium chloride 0.9 % 100 mL IVPB        1 g 200 mL/hr over 30 Minutes Intravenous Every 24 hours 04/19/20 1949 04/24/20 2121   04/18/20 1645  cefTRIAXone (ROCEPHIN) 2 g in sodium chloride 0.9 % 100 mL IVPB  Status:  Discontinued        2 g 200 mL/hr over 30 Minutes Intravenous Every 24 hours 04/18/20 1625 04/19/20 1928   04/17/20 1530  cefTRIAXone (ROCEPHIN) 1 g in sodium chloride 0.9 % 100 mL IVPB  Status:  Discontinued        1 g 200 mL/hr over 30 Minutes Intravenous Every 24 hours 04/17/20 1517 04/17/20 2212       Objective:   Vitals:   04/25/20 0535 04/25/20 1336 04/25/20 2327 04/26/20 0522  BP: (!) 141/88 (!) 154/81 (!) 154/99 (!) 142/101  Pulse: (!) 103 89 91 99  Resp: 17 18 18 18   Temp: 99.2 F (37.3 C) 98.7 F (37.1 C) 97.9 F (36.6 C) 98.8 F (37.1 C)  TempSrc:  Oral  Oral  SpO2: 100% 100% 99% 100%  Weight:      Height:        Wt Readings from Last 3 Encounters:  04/24/20 72.3 kg  02/21/20 83.5 kg  02/21/20 83.5 kg    Intake/Output Summary (Last 24 hours) at 04/26/2020 1734 Last data filed at 04/26/2020 8338 Gross per 24 hour  Intake --  Output 500 ml  Net -500 ml   Physical Exam Gen:- Awake , more alert today,  not eating or drinking well  HEENT:- [place]/AT, No sclera icterus Neck-Supple Neck,No JVD.  Lungs-diminished in bases, no wheezing  CV- S1, S2 normal, regular  Abd-  +ve B.Sounds, Abd Soft, No tenderness, no CVA area tenderness    Extremity/Skin:- No  edema, pedal pulses present  Psych-affect is intermittently confused with intermittent disorientation neuro-residual left-sided hemiparesis, no new focal deficits, no tremors   Data Review:   Micro Results Recent Results (from the past 240 hour(s))  Urine culture     Status: Abnormal   Collection Time: 04/17/20  2:34 PM   Specimen: In/Out  Cath Urine  Result Value Ref Range Status   Specimen Description   Final    IN/OUT CATH URINE Performed at [facility], [address], [place], [place] [address]    Special Requests   Final    NONE Performed at [facility], [address], [place],  [address]    Culture 30,000 COLONIES/mL PSEUDOMONAS AERUGINOSA (A)  Final   Report Status 04/20/2020 FINAL  Final   Organism ID,  Bacteria PSEUDOMONAS AERUGINOSA (A)  Final      Susceptibility   Pseudomonas aeruginosa - MIC*    CEFTAZIDIME 2 SENSITIVE Sensitive     CIPROFLOXACIN <=0.25 SENSITIVE Sensitive     GENTAMICIN <=1 SENSITIVE Sensitive     IMIPENEM 1 SENSITIVE Sensitive     PIP/TAZO 8 SENSITIVE Sensitive     CEFEPIME 2 SENSITIVE Sensitive     * 30,000 COLONIES/mL PSEUDOMONAS AERUGINOSA  SARS Coronavirus 2 by RT PCR (hospital order, performed in [place] hospital lab) Nasopharyngeal Nasopharyngeal Swab     Status: None   Collection Time: 04/17/20  3:43 PM   Specimen: Nasopharyngeal Swab  Result Value Ref Range Status   SARS Coronavirus 2 NEGATIVE NEGATIVE Final    Comment: (NOTE) SARS-CoV-2 target nucleic acids are NOT DETECTED.  The SARS-CoV-2 RNA is generally detectable in upper and lower respiratory specimens during the acute phase of infection. The lowest concentration of SARS-CoV-2 viral copies this assay can detect is 250 copies / mL. A negative result does not preclude SARS-CoV-2 infection and should not be used as the sole basis for treatment or other patient management decisions.  A negative result may occur with improper specimen collection / handling, submission of specimen other than nasopharyngeal swab, presence of viral mutation(s) within the areas targeted by this assay, and inadequate number of viral copies (<250 copies / mL). A negative result must be combined with clinical observations, patient history, and epidemiological information.  Fact Sheet for Patients:    StrictlyIdeas.no  Fact Sheet for Healthcare Providers: BankingDealers.co.za  This test is not yet approved or  cleared by the [place] FDA and has been authorized for detection and/or diagnosis of SARS-CoV-2 by FDA under an Emergency Use Authorization (EUA).  This EUA will remain in effect (meaning this test can be used) for the duration of the COVID-19 declaration under Section 564(b)(1) of the Act, 21 U.S.C. section 360bbb-3(b)(1), unless the authorization is terminated or revoked sooner.  Performed at [facility], [address], [place], [place] [address]   Blood Culture (routine x 2)     Status: None   Collection Time: 04/17/20  4:19 PM   Specimen: Right Antecubital; Blood  Result Value Ref Range Status   Specimen Description RIGHT ANTECUBITAL  Final   Special Requests   Final    BOTTLES DRAWN AEROBIC AND ANAEROBIC Blood Culture adequate volume   Culture   Final    NO GROWTH 5 DAYS Performed at [facility], [address] [place], [place] [address]    Report Status 04/22/2020 FINAL  Final  Blood Culture (routine x 2)     Status: Abnormal   Collection Time: 04/17/20  4:24 PM   Specimen: Left Antecubital; Blood  Result Value Ref Range Status   Specimen Description   Final    LEFT ANTECUBITAL Performed at [facility], [address] [place], [place] [address]    Special Requests   Final    BOTTLES DRAWN AEROBIC AND ANAEROBIC Blood Culture adequate volume Performed at [facility], [address], [place], [place] [address]    Culture  Setup Time   Final    BOTH BOTTLES GRAM POSITIVE COCCI Gram Stain Report Called to,Read Back By and Verified With: Lowry Ram 04/18/20 @1619  BY JONEST  CRITICAL RESULT CALLED TO, READ BACK BY AND VERIFIED WITH: D,JONES RN @2257  04/18/20 EB    Culture (A)  Final    STAPHYLOCOCCUS EPIDERMIDIS THE SIGNIFICANCE OF ISOLATING THIS ORGANISM FROM A SINGLE SET  OF BLOOD CULTURES WHEN MULTIPLE  SETS ARE DRAWN IS UNCERTAIN. PLEASE NOTIFY THE MICROBIOLOGY DEPARTMENT WITHIN ONE WEEK IF SPECIATION AND SENSITIVITIES ARE REQUIRED. Performed at [facility], [place] [address], [place], [place] [address]    Report Status 04/20/2020 FINAL  Final  Blood Culture ID Panel (Reflexed)     Status: Abnormal   Collection Time: 04/17/20  4:24 PM  Result Value Ref Range Status   Enterococcus species NOT DETECTED NOT DETECTED Final   Listeria monocytogenes NOT DETECTED NOT DETECTED Final   Staphylococcus species DETECTED (A) NOT DETECTED Final    Comment: Methicillin (oxacillin) resistant coagulase negative staphylococcus. Possible blood culture contaminant (unless isolated from more than one blood culture draw or clinical case suggests pathogenicity). No antibiotic treatment is indicated for blood  culture contaminants. CRITICAL RESULT CALLED TO, READ BACK BY AND VERIFIED WITH: D,JONES RN @2257  04/18/20 EB    Staphylococcus aureus (BCID) NOT DETECTED NOT DETECTED Final   Methicillin resistance DETECTED (A) NOT DETECTED Final    Comment: CRITICAL RESULT CALLED TO, READ BACK BY AND VERIFIED WITH: D,JONES RN @2257  04/18/20 EB    Streptococcus species NOT DETECTED NOT DETECTED Final   Streptococcus agalactiae NOT DETECTED NOT DETECTED Final   Streptococcus pneumoniae NOT DETECTED NOT DETECTED Final   Streptococcus pyogenes NOT DETECTED NOT DETECTED Final   Acinetobacter baumannii NOT DETECTED NOT DETECTED Final   Enterobacteriaceae species NOT DETECTED NOT DETECTED Final   Enterobacter cloacae complex NOT DETECTED NOT DETECTED Final   Escherichia coli NOT DETECTED NOT DETECTED Final   Klebsiella oxytoca NOT DETECTED NOT DETECTED Final   Klebsiella pneumoniae NOT DETECTED NOT DETECTED Final   Proteus species NOT DETECTED NOT DETECTED Final   Serratia marcescens NOT DETECTED NOT DETECTED Final   Haemophilus influenzae NOT DETECTED NOT DETECTED Final   Neisseria meningitidis NOT DETECTED NOT  DETECTED Final   Pseudomonas aeruginosa NOT DETECTED NOT DETECTED Final   Candida albicans NOT DETECTED NOT DETECTED Final   Candida glabrata NOT DETECTED NOT DETECTED Final   Candida krusei NOT DETECTED NOT DETECTED Final   Candida parapsilosis NOT DETECTED NOT DETECTED Final   Candida tropicalis NOT DETECTED NOT DETECTED Final    Comment: Performed at [facility], [place]. [address], [place], [place] [address]    Radiology Reports DG Chest 1 View  Result Date: 04/17/2020 CLINICAL DATA:  Chest pain, altered mental status EXAM: CHEST  1 VIEW COMPARISON:  02/21/2020 FINDINGS: The generalized lung apices, slightly limiting evaluation. The lungs are symmetrically expanded and are clear. No definite pneumothorax. No pleural effusion. The cardiomediastinal silhouette is unremarkable. Pulmonary vascularity is normal. Surgical clips are noted within the axilla bilaterally. No acute bone abnormality IMPRESSION: No active disease. Electronically Signed   By: Fidela Salisbury MD   On: 04/17/2020 15:11   CT Head Wo Contrast  Result Date: 04/17/2020 CLINICAL DATA:  Delirium EXAM: CT HEAD WITHOUT CONTRAST TECHNIQUE: Contiguous axial images were obtained from the base of the skull through the vertex without intravenous contrast. COMPARISON:  Feb 21, 2020 FINDINGS: Brain: No evidence of acute territorial infarction, hemorrhage, hydrocephalus,extra-axial collection or mass lesion/mass effect. There is dilatation the ventricles and sulci consistent with age-related atrophy. Low-attenuation changes in the deep white matter consistent with small vessel ischemia. Prior lacunar infarct seen within the right basal ganglia. Vascular: No hyperdense vessel or unexpected calcification. Skull: The skull is intact. No fracture or focal lesion identified. Sinuses/Orbits: The visualized paranasal sinuses and mastoid air cells are clear.  The orbits and globes intact. Other: None IMPRESSION: No acute intracranial abnormality.  Findings consistent with age related atrophy and chronic small vessel ischemia Electronically Signed   By: Prudencio Pair M.D.   On: 04/17/2020 17:00   CT CHEST W CONTRAST  Result Date: 04/24/2020 CLINICAL DATA:  Pathologic lymphadenopathy EXAM: CT CHEST, ABDOMEN, AND PELVIS WITH CONTRAST TECHNIQUE: Multidetector CT imaging of the chest, abdomen and pelvis was performed following the standard protocol during bolus administration of intravenous contrast. CONTRAST:  40mL OMNIPAQUE IOHEXOL 300 MG/ML  SOLN COMPARISON:  CT chest 02/21/2020, CT abdomen pelvis 04/22/2015 FINDINGS: CT CHEST FINDINGS Cardiovascular: Extensive multi-vessel coronary artery calcification again noted. Global cardiac size within normal limits. Small pericardial effusion is again noted without CT evidence of cardiac tamponade. The pericardial effusion demonstrates relatively bland appearing fluid in demonstrates no pericardial enhancement suggesting a transudative effusion. The central pulmonary arteries are enlarged in keeping with changes of pulmonary arterial hypertension. Extensive atherosclerotic calcification is seen within the thoracic aorta without aneurysm. Mediastinum/Nodes: Multiple thyroid nodules are again identified. There is pathologic supraclavicular mediastinal adenopathy identified, though left hilar and subcarinal adenopathy appears to have improved in the interval since prior examination. By example, left supraclavicular lymph node measures 13 mm x 16 mm at axial image # 5/2. Precarinal lymph node measures 19 mm x 22 mm at axial image # 18/2. Additional pathologic adenopathy is noted within the prevascular, aortopulmonary, lymph node groups. Shotty adenopathy is seen within the left axillary lymph node group, though this does not appear pathologically enlarged. Bilateral axillary lymph node dissection has been performed. Right mastectomy has been performed. Lungs/Pleura: Left basilar atelectasis again noted. Focal infiltrate  within the lingula noted on prior examination has resolved. 4 mm pulmonary nodule within the right apex is new from prior examination and is nonspecific, but likely infectious or inflammatory given its rapid development since prior examination. No new focal pulmonary nodules or infiltrates are identified. Linear atelectasis within the right middle lobe noted. No pneumothorax or pleural effusion. The central airways are patent. Musculoskeletal: Subacute distal right clavicle fracture again noted. CT ABDOMEN PELVIS FINDINGS Hepatobiliary: Liver is unremarkable. Mild extrahepatic biliary ductal dilation is within acceptable limits for age. Cholelithiasis without CT evidence of acute cholecystitis. Pancreas: Unremarkable Spleen: Unremarkable Adrenals/Urinary Tract: The adrenal glands are unremarkable. There is marked, chronic atrophy the left kidney. Compensatory hypertrophy of the right kidney which is unremarkable. The bladder is unremarkable. Stomach/Bowel: Large volume stool within the rectal vault. Severe sigmoid diverticulosis. Stomach and small bowel are unremarkable. No free intraperitoneal gas or fluid. Vascular/Lymphatic: There is pathologic left periaortic and right common iliac lymphadenopathy. Borderline enlarged lymph node is noted within the right inguinal region. By example, 19 mm right common iliac lymph node is seen at axial image # 78/2. Borderline right inguinal node measures 13 mm x 26 mm at axial image # 99/2. There is extensive aortoiliac atherosclerotic calcification present. Particularly prominent calcification noted at the right renal artery ostium. A saccular aneurysm of the infrarenal abdominal aorta is identified measuring 19 mm x 30 mm in axial image # 66 enlarged since prior examination where this measured 19 mm by 24 mm. Reproductive: Uterus absent.  No adnexal masses. Other: Moderate presacral edema is nonspecific, but may represent inflammatory changes related to stercoral proctitis.  Musculoskeletal: Degenerative changes are seen within the lumbar spine. No focal lytic or blastic bone lesions are seen. IMPRESSION: Pathologic left supraclavicular, mediastinal, retroperitoneal, and possible right inguinal and right axillary adenopathy. Given the interval  improvement in adenopathy involving the left hilum and subcarinal region, a waxing and waning inflammatory process should also be considered, such as sarcoidosis. Lymphoproliferative disorders, such as lymphoma or leukemia, are not excluded. PET CT examination may be helpful to assess metabolic activity of these abnormal lymph nodes. If hypermetabolic, left supraclavicular and possible right inguinal adenopathy would provide easy targets for ultrasound-guided biopsy for tissue diagnosis. Electronically Signed   By: Fidela Salisbury MD   On: 04/24/2020 19:48   CT ABDOMEN PELVIS W CONTRAST  Result Date: 04/24/2020 CLINICAL DATA:  Pathologic lymphadenopathy EXAM: CT CHEST, ABDOMEN, AND PELVIS WITH CONTRAST TECHNIQUE: Multidetector CT imaging of the chest, abdomen and pelvis was performed following the standard protocol during bolus administration of intravenous contrast. CONTRAST:  40mL OMNIPAQUE IOHEXOL 300 MG/ML  SOLN COMPARISON:  CT chest 02/21/2020, CT abdomen pelvis 04/22/2015 FINDINGS: CT CHEST FINDINGS Cardiovascular: Extensive multi-vessel coronary artery calcification again noted. Global cardiac size within normal limits. Small pericardial effusion is again noted without CT evidence of cardiac tamponade. The pericardial effusion demonstrates relatively bland appearing fluid in demonstrates no pericardial enhancement suggesting a transudative effusion. The central pulmonary arteries are enlarged in keeping with changes of pulmonary arterial hypertension. Extensive atherosclerotic calcification is seen within the thoracic aorta without aneurysm. Mediastinum/Nodes: Multiple thyroid nodules are again identified. There is pathologic  supraclavicular mediastinal adenopathy identified, though left hilar and subcarinal adenopathy appears to have improved in the interval since prior examination. By example, left supraclavicular lymph node measures 13 mm x 16 mm at axial image # 5/2. Precarinal lymph node measures 19 mm x 22 mm at axial image # 18/2. Additional pathologic adenopathy is noted within the prevascular, aortopulmonary, lymph node groups. Shotty adenopathy is seen within the left axillary lymph node group, though this does not appear pathologically enlarged. Bilateral axillary lymph node dissection has been performed. Right mastectomy has been performed. Lungs/Pleura: Left basilar atelectasis again noted. Focal infiltrate within the lingula noted on prior examination has resolved. 4 mm pulmonary nodule within the right apex is new from prior examination and is nonspecific, but likely infectious or inflammatory given its rapid development since prior examination. No new focal pulmonary nodules or infiltrates are identified. Linear atelectasis within the right middle lobe noted. No pneumothorax or pleural effusion. The central airways are patent. Musculoskeletal: Subacute distal right clavicle fracture again noted. CT ABDOMEN PELVIS FINDINGS Hepatobiliary: Liver is unremarkable. Mild extrahepatic biliary ductal dilation is within acceptable limits for age. Cholelithiasis without CT evidence of acute cholecystitis. Pancreas: Unremarkable Spleen: Unremarkable Adrenals/Urinary Tract: The adrenal glands are unremarkable. There is marked, chronic atrophy the left kidney. Compensatory hypertrophy of the right kidney which is unremarkable. The bladder is unremarkable. Stomach/Bowel: Large volume stool within the rectal vault. Severe sigmoid diverticulosis. Stomach and small bowel are unremarkable. No free intraperitoneal gas or fluid. Vascular/Lymphatic: There is pathologic left periaortic and right common iliac lymphadenopathy. Borderline enlarged  lymph node is noted within the right inguinal region. By example, 19 mm right common iliac lymph node is seen at axial image # 78/2. Borderline right inguinal node measures 13 mm x 26 mm at axial image # 99/2. There is extensive aortoiliac atherosclerotic calcification present. Particularly prominent calcification noted at the right renal artery ostium. A saccular aneurysm of the infrarenal abdominal aorta is identified measuring 19 mm x 30 mm in axial image # 66 enlarged since prior examination where this measured 19 mm by 24 mm. Reproductive: Uterus absent.  No adnexal masses. Other: Moderate presacral edema is nonspecific,  but may represent inflammatory changes related to stercoral proctitis. Musculoskeletal: Degenerative changes are seen within the lumbar spine. No focal lytic or blastic bone lesions are seen. IMPRESSION: Pathologic left supraclavicular, mediastinal, retroperitoneal, and possible right inguinal and right axillary adenopathy. Given the interval improvement in adenopathy involving the left hilum and subcarinal region, a waxing and waning inflammatory process should also be considered, such as sarcoidosis. Lymphoproliferative disorders, such as lymphoma or leukemia, are not excluded. PET CT examination may be helpful to assess metabolic activity of these abnormal lymph nodes. If hypermetabolic, left supraclavicular and possible right inguinal adenopathy would provide easy targets for ultrasound-guided biopsy for tissue diagnosis. Electronically Signed   By: Fidela Salisbury MD   On: 04/24/2020 19:48   DG Swallowing Func-Speech Pathology  Result Date: 04/23/2020 Objective Swallowing Evaluation: Type of Study: MBS-Modified Barium Swallow Study  Patient Details Name: FATMA RUTTEN MRN: [id number] Date of Birth: Jan 17, 1925 Today's Date: 04/23/2020 Time: SLP Start Time (ACUTE ONLY): 1353 -SLP Stop Time (ACUTE ONLY): 1418 SLP Time Calculation (min) (ACUTE ONLY): 25 min Past Medical History: Past Medical  History: Diagnosis Date . Anemia 02/02/2014 . Anxiety  . Arthritis   knee . Asthma  . CAD (coronary artery disease) cardiologist-  dr Bronson Ing  ([facility] in [place])  2003  Cath with nonobstructive CAD . Chest pain 08/14/2017 . Chronic headache  . Chronic lower back pain  . COPD (chronic obstructive pulmonary disease) ([place])  . Diastolic CHF, chronic ([place])  . Duodenal diverticulum  . Edema of left lower extremity  . Fibromyalgia  . Full dentures  . Gait disorder   uses walker . GERD (gastroesophageal reflux disease)  . Headache disorder 01/17/2015 . Heart murmur  . History of breast cancer  . History of Clostridium difficile  . History of colitis   2001 . History of CVA with residual deficit   06/ 2015  residual left hemiparesis . History of esophageal dilatation  . History of falling   last fall 06-25-2015 . History of hiatal hernia  . History of small bowel obstruction   04-25-2015  resolved without surgical intervention . Lesion of bladder  . Low vision, one eye   right eye . Macular degeneration of both eyes   right eye now low vision/  left eye getting injections currently . Malignant hypertension  . OAB (overactive bladder)  . Protein-calorie malnutrition, severe ([place]) 01/10/2015 . SBO (small bowel obstruction) ([place]) 04/22/2015 . Schatzki's ring  . Sciatica of right side 05/29/2015 . Syncope 08/14/2017 . Urge and stress incontinence  . Varicose veins  . Venous insufficiency, peripheral  . Vertigo  Past Surgical History: Past Surgical History: Procedure Laterality Date . Swisher  from colon . ABDOMINAL HYSTERECTOMY  1954  w/ bilateral salpingoophorectomy . ANTERIOR CERVICAL DECOMP/DISCECTOMY FUSION  1971  "used bone off of my right hip" . BREAST LUMPECTOMY Left 2006 . CARDIAC CATHETERIZATION  11-29-2001  dr Tressia Miners turner  Non-obstructive CAD/  20% pLAD,  30% pD1, 50% mRCA/  normal LVF . CATARACT EXTRACTION W/ INTRAOCULAR LENS  IMPLANT, BILATERAL  right 2008//  left 2015 . CYSTOSCOPY  WITH HYDRODISTENSION AND BIOPSY N/A 07/23/2015  Procedure: CYSTOSCOPY;HYDRODISTENSION;  Surgeon: Irine Seal, MD;  Location: [facility];  Service: Urology;  Laterality: N/A; . DILATION AND CURETTAGE OF UTERUS  1953   miscarriage . [place] . KNEE ARTHROSCOPY Right 2005 . MASTECTOMY, PARTIAL Right 1999 . OVARIAN CYST REMOVAL  1946 . PATELLA FRACTURE SURGERY Left 2008 .  TRANSTHORACIC ECHOCARDIOGRAM  01-09-2015  mild LVH,  ef 76-72%, grade I diastolic dysfunction/  mild MR/  trivial pericardial effusion was identified HPI: Leigh H. Jaso is a 86 y.o. female with medical history significant for diastolic CHF, COPD, asthma, hypertension, CVA with residual left hemiparesis admitted on 04/17/20 with altered mentation/disorientation in the setting of presumed UTI and GPC bacteremia altered mentation and disorientation in the setting of presumed UTI and GPC bacteremia.Pt was placed on D3 and NTL on 7/16; MBS ordered to objectively assess the swallowing function.  No data recorded Assessment / Plan / Recommendation CHL IP CLINICAL IMPRESSIONS 04/23/2020 Clinical Impression Pt presents with severe oropharyngeal dysphagia compounded by altered mental status; swallowing is characterized by poor awareness of bolus, severe anterior spillage, poor bolus cohesion, prolonged AP transit, lingual pumping, premature spillage pooling into the pyriforms, delayed swallowing trigger, penetration of all liquids (thin, NTL & HTL), and trace to mod SILENT aspiration (with thin, NTL & HTL) (this was sensed X2 out of multiple episodes of aspiration). Note decreased pharyngeal squeeze and decreased laryngeal vestibule closure. At this time, Pt is cognitively unable to consistently participate in strategies however she naturally remains in the position of a chin tuck which was effective in decreasing/eliminating aspiration with tsp sips of HTL. No penetration/aspiration was noted with puree or solid textures however  solid texture trials were limited secondary to fatigue. Pt is at VERY HIGH risk of aspiration as trace-mod aspiration was noted with all liquid textures however least restrictive diet is D2/fine chop and HTL (administered with tsp); recommend precautions: small bites/sips, Pt should be seated upright, chin tuck (natural positioning) is ok and recommended for swallowing. Also note Pt is at risk for inadequate nutrition and dehydration. Pt is a good candidate for free water protocol (oral care in between meals followed by ice chips or WATER by teaspoon). Recommend consider repeat MBS after Pt's mentation has improved, subjective upgrade will be challenging secondary to multiple episodes of silent aspiration. ST will continue to follow acutely SLP Visit Diagnosis Dysphagia, unspecified (R13.10) Attention and concentration deficit following -- Frontal lobe and executive function deficit following -- Impact on safety and function Mild aspiration risk;Moderate aspiration risk   CHL IP TREATMENT RECOMMENDATION 04/23/2020 Treatment Recommendations Therapy as outlined in treatment plan below   Prognosis 04/23/2020 Prognosis for Safe Diet Advancement Fair Barriers to Reach Goals Cognitive deficits;Severity of deficits Barriers/Prognosis Comment -- CHL IP DIET RECOMMENDATION 04/23/2020 SLP Diet Recommendations Dysphagia 2 (Fine chop) solids;Honey thick liquids Liquid Administration via Spoon Medication Administration Whole meds with puree Compensations Minimize environmental distractions;Slow rate;Small sips/bites;Monitor for anterior loss;Multiple dry swallows after each bite/sip;Clear throat after each swallow;Clear throat intermittently Postural Changes Remain semi-upright after after feeds/meals (Comment);Seated upright at 90 degrees   CHL IP OTHER RECOMMENDATIONS 04/23/2020 Recommended Consults -- Oral Care Recommendations Oral care BID Other Recommendations Order thickener from pharmacy   CHL IP FOLLOW UP RECOMMENDATIONS  04/23/2020 Follow up Recommendations 24 hour supervision/assistance;Skilled Nursing facility   [facility] IP FREQUENCY AND DURATION 04/23/2020 Speech Therapy Frequency (ACUTE ONLY) min 1 x/week Treatment Duration 1 week      CHL IP ORAL PHASE 04/23/2020 Oral Phase Impaired Oral - Pudding Teaspoon -- Oral - Pudding Cup -- Oral - Honey Teaspoon Left anterior bolus loss;Right anterior bolus loss;Impaired mastication;Weak lingual manipulation;Lingual pumping;Reduced posterior propulsion;Holding of bolus;Lingual/palatal residue;Piecemeal swallowing;Delayed oral transit;Decreased bolus cohesion;Premature spillage Oral - Honey Cup Left anterior bolus loss;Right anterior bolus loss;Impaired mastication;Weak lingual manipulation;Lingual pumping;Reduced posterior propulsion;Holding of bolus;Lingual/palatal residue;Piecemeal swallowing;Delayed oral transit;Decreased  bolus cohesion;Premature spillage Oral - Nectar Teaspoon Left anterior bolus loss;Right anterior bolus loss;Impaired mastication;Weak lingual manipulation;Lingual pumping;Reduced posterior propulsion;Holding of bolus;Lingual/palatal residue;Piecemeal swallowing;Delayed oral transit;Decreased bolus cohesion;Premature spillage Oral - Nectar Cup Left anterior bolus loss;Right anterior bolus loss;Impaired mastication;Weak lingual manipulation;Lingual pumping;Reduced posterior propulsion;Holding of bolus;Lingual/palatal residue;Piecemeal swallowing;Delayed oral transit;Decreased bolus cohesion;Premature spillage Oral - Nectar Straw Left anterior bolus loss;Right anterior bolus loss;Impaired mastication;Weak lingual manipulation;Lingual pumping;Reduced posterior propulsion;Holding of bolus;Lingual/palatal residue;Piecemeal swallowing;Delayed oral transit;Decreased bolus cohesion;Premature spillage Oral - Thin Teaspoon Left anterior bolus loss;Right anterior bolus loss;Impaired mastication;Weak lingual manipulation;Lingual pumping;Reduced posterior propulsion;Holding of  bolus;Lingual/palatal residue;Piecemeal swallowing;Delayed oral transit;Decreased bolus cohesion;Premature spillage Oral - Thin Cup Left anterior bolus loss;Right anterior bolus loss;Impaired mastication;Weak lingual manipulation;Lingual pumping;Reduced posterior propulsion;Holding of bolus;Lingual/palatal residue;Piecemeal swallowing;Delayed oral transit;Decreased bolus cohesion;Premature spillage Oral - Thin Straw -- Oral - Puree Left anterior bolus loss;Right anterior bolus loss;Impaired mastication;Weak lingual manipulation;Lingual pumping;Reduced posterior propulsion;Holding of bolus;Lingual/palatal residue;Piecemeal swallowing;Delayed oral transit;Decreased bolus cohesion;Premature spillage Oral - Mech Soft -- Oral - Regular Left anterior bolus loss;Right anterior bolus loss;Impaired mastication;Weak lingual manipulation;Lingual pumping;Reduced posterior propulsion;Holding of bolus;Lingual/palatal residue;Piecemeal swallowing;Delayed oral transit;Decreased bolus cohesion;Premature spillage Oral - Multi-Consistency -- Oral - Pill -- Oral Phase - Comment --  CHL IP PHARYNGEAL PHASE 04/23/2020 Pharyngeal Phase Impaired Pharyngeal- Pudding Teaspoon -- Pharyngeal -- Pharyngeal- Pudding Cup -- Pharyngeal -- Pharyngeal- Honey Teaspoon Delayed swallow initiation-vallecula;Delayed swallow initiation-pyriform sinuses;Reduced pharyngeal peristalsis;Reduced airway/laryngeal closure;Penetration/Aspiration during swallow;Penetration/Apiration after swallow;Moderate aspiration;Pharyngeal residue - valleculae;Pharyngeal residue - pyriform;Lateral channel residue Pharyngeal Material enters airway, passes BELOW cords without attempt by patient to eject out (silent aspiration) Pharyngeal- Honey Cup Delayed swallow initiation-vallecula;Delayed swallow initiation-pyriform sinuses;Reduced pharyngeal peristalsis;Reduced airway/laryngeal closure;Penetration/Aspiration during swallow;Penetration/Apiration after swallow;Moderate  aspiration;Pharyngeal residue - valleculae;Pharyngeal residue - pyriform;Lateral channel residue Pharyngeal Material enters airway, passes BELOW cords without attempt by patient to eject out (silent aspiration) Pharyngeal- Nectar Teaspoon -- Pharyngeal -- Pharyngeal- Nectar Cup Delayed swallow initiation-vallecula;Delayed swallow initiation-pyriform sinuses;Reduced pharyngeal peristalsis;Reduced airway/laryngeal closure;Penetration/Aspiration during swallow;Penetration/Apiration after swallow;Moderate aspiration;Pharyngeal residue - valleculae;Pharyngeal residue - pyriform;Lateral channel residue Pharyngeal Material enters airway, passes BELOW cords without attempt by patient to eject out (silent aspiration) Pharyngeal- Nectar Straw Delayed swallow initiation-vallecula;Delayed swallow initiation-pyriform sinuses;Reduced pharyngeal peristalsis;Reduced airway/laryngeal closure;Penetration/Aspiration during swallow;Penetration/Apiration after swallow;Moderate aspiration;Pharyngeal residue - valleculae;Pharyngeal residue - pyriform;Lateral channel residue Pharyngeal Material enters airway, passes BELOW cords without attempt by patient to eject out (silent aspiration) Pharyngeal- Thin Teaspoon Delayed swallow initiation-vallecula;Delayed swallow initiation-pyriform sinuses;Reduced pharyngeal peristalsis;Reduced airway/laryngeal closure;Penetration/Aspiration during swallow;Penetration/Apiration after swallow;Moderate aspiration;Pharyngeal residue - valleculae;Pharyngeal residue - pyriform;Lateral channel residue Pharyngeal Material enters airway, passes BELOW cords without attempt by patient to eject out (silent aspiration) Pharyngeal- Thin Cup Delayed swallow initiation-vallecula;Delayed swallow initiation-pyriform sinuses;Reduced pharyngeal peristalsis;Reduced airway/laryngeal closure;Penetration/Aspiration during swallow;Penetration/Apiration after swallow;Moderate aspiration;Pharyngeal residue - valleculae;Pharyngeal  residue - pyriform;Lateral channel residue Pharyngeal Material enters airway, passes BELOW cords without attempt by patient to eject out (silent aspiration) Pharyngeal- Thin Straw NT Pharyngeal -- Pharyngeal- Puree Delayed swallow initiation-vallecula Pharyngeal -- Pharyngeal- Mechanical Soft -- Pharyngeal -- Pharyngeal- Regular Delayed swallow initiation-vallecula Pharyngeal -- Pharyngeal- Multi-consistency -- Pharyngeal -- Pharyngeal- Pill -- Pharyngeal -- Pharyngeal Comment pronounce cricopharyngeus  No flowsheet data found. Amelia H. Roddie Mc, CCC-SLP Speech Language Pathologist Wende Bushy 04/23/2020, 3:34 PM              [place] EKG SITE RITE  Result Date: 04/18/2020 If Site Rite image not attached, placement could not be confirmed due to current cardiac rhythm.    CBC Recent Labs  Lab 04/20/20 0821  WBC 6.3  HGB 9.6*  HCT 31.3*  PLT 291  MCV 85.8  MCH 26.3  MCHC 30.7  RDW 14.9    Chemistries  Recent Labs  Lab 04/21/20 0625 04/22/20 0701 04/23/20 0436 04/24/20 0900 04/25/20 0625 04/26/20 0915  NA 140 144  --  146* 144 143  K 2.6* 3.5  --  2.8* 3.3* 3.3*  CL 104 108  --  106 106 109  CO2 28 28  --  29 26 25   GLUCOSE 100* 106*  --  113* 112* 102*  BUN 18 17  --  30* 32* 31*  CREATININE 1.21* 1.18*  --  1.46* 1.35* 1.10*  CALCIUM 12.3* 12.5*  --  12.8* 11.0* 9.8  MG 1.5*  --  2.0 1.9  --  1.6*  AST  --   --   --  32  --   --   ALT  --   --   --  27  --   --   ALKPHOS  --   --   --  104  --   --   BILITOT  --   --   --  0.5  --   --    ------------------------------------------------------------------------------------------------------------------ No results for input(s): CHOL, HDL, LDLCALC, TRIG, CHOLHDL, LDLDIRECT in the last 72 hours.  No results found for: HGBA1C ------------------------------------------------------------------------------------------------------------------ No results for input(s): TSH, T4TOTAL, T3FREE, THYROIDAB in the last 72  hours.  Invalid input(s): FREET3 ------------------------------------------------------------------------------------------------------------------ No results for input(s): VITAMINB12, FOLATE, FERRITIN, TIBC, IRON, RETICCTPCT in the last 72 hours.  Coagulation profile No results for input(s): INR, PROTIME in the last 168 hours.  No results for input(s): DDIMER in the last 72 hours.  Cardiac Enzymes No results for input(s): CKMB, TROPONINI, MYOGLOBIN in the last 168 hours.  Invalid input(s): CK ------------------------------------------------------------------------------------------------------------------    Component Value Date/Time   BNP 21.0 02/21/2020 1614   Ireene Ballowe M.D on 04/26/2020 at 5:34 PM  Go to [email or web address] - for contact info How to contact the [facility] Attending or Consulting provider [place] or covering provider during after hours [place], for this patient?  1. Check the care team in [facility] and look for a) attending/consulting [facility] provider listed and b) the [facility] team listed 2. Log into [email or web address] and use [place]'s universal password to access. If you do not have the password, please contact the hospital operator. 3. Locate the [facility] provider you are looking for under [facility] and page to a number that you can be directly reached. 4. If you still have difficulty reaching the provider, please page the [facility] (Director on Call) for the Hospitalists listed on amion for assistance.

## 2020-04-26 NOTE — Care Management Important Message (Signed)
Important Message  Patient Details  Name: Kristie Morris MRN: [id number] Date of Birth: August 09, 1925   Medicare Important Message Given:  Yes     Tommy Medal 04/26/2020, 12:28 PM

## 2020-04-26 NOTE — Progress Notes (Signed)
Palliative Medicine RN Note: Our team rec'd a call from patient's grandson Kristie Morris ([phone]), who was asking for an update on how Kristie Morris is doing, from a medical standpoint. He also got a call from the SNF that she has a bed assignment.  PMT does not have a provider at [facility] today, but we do have a scheduled follow up with Kristie Morris's family on Monday. I have messaged Dr Wynetta Emery, Kristie Morris's attending for today, and asked that he call Kristie Morris with an update, or go by the bedside later today when family will be at [facility].  Kristie Skiff Halen Antenucci, RN, BSN, [facility] Palliative Medicine Team 04/26/2020 1:23 PM Office [phone]

## 2020-04-27 DIAGNOSIS — J449 Chronic obstructive pulmonary disease, unspecified: Secondary | ICD-10-CM

## 2020-04-27 DIAGNOSIS — R54 Age-related physical debility: Secondary | ICD-10-CM

## 2020-04-27 LAB — BASIC METABOLIC PANEL
Anion gap: 8 (ref 5–15)
BUN: 30 mg/dL — ABNORMAL HIGH (ref 8–23)
CO2: 25 mmol/L (ref 22–32)
Calcium: 9.5 mg/dL (ref 8.9–10.3)
Chloride: 109 mmol/L (ref 98–111)
Creatinine, Ser: 1.04 mg/dL — ABNORMAL HIGH (ref 0.44–1.00)
GFR calc Af Amer: 54 mL/min — ABNORMAL LOW (ref >60)
GFR calc non Af Amer: 46 mL/min — ABNORMAL LOW (ref >60)
Glucose, Bld: 99 mg/dL (ref 70–99)
Potassium: 3.5 mmol/L (ref 3.5–5.1)
Sodium: 142 mmol/L (ref 135–145)

## 2020-04-27 LAB — MAGNESIUM: Magnesium: 2 mg/dL (ref 1.7–2.4)

## 2020-04-27 MED ORDER — POTASSIUM CHLORIDE 10 MEQ/100ML IV SOLN
10.0000 meq | INTRAVENOUS | Status: AC
  Administered 2020-04-27 (×2): 10 meq via INTRAVENOUS
  Filled 2020-04-27 (×2): qty 100

## 2020-04-27 MED ORDER — KCL IN DEXTROSE-NACL 40-5-0.45 MEQ/L-%-% IV SOLN: INTRAVENOUS | Status: DC

## 2020-04-27 MED ORDER — SODIUM CHLORIDE 0.45 % IV SOLN: INTRAVENOUS | Status: DC

## 2020-04-27 NOTE — Progress Notes (Addendum)
Patient Demographics:    Kristie Morris, is a 86 y.o. female, DOB - 1925-03-03, AQT:[id number]  Admit date - 04/17/2020   Admitting Physician [name], MD  Outpatient Primary MD for the patient is [name], NP  LOS - 9  Chief Complaint  Patient presents with  . possible UTI        Subjective:    Kristie Morris without complaints, more alert today.    GOC--- severe dysphagia with aspiration with most consistencies, essentially wheelchair-bound status----- further conversations with family about realistic expectations and overall prognosis- --requesting palliative care to help with further conversations about advanced directives and kindly help further delineate goals of care and expectations   Assessment  & Plan :    Principal Problem:   Acute metabolic encephalopathy Active Problems:   Diastolic dysfunction   Asthma, chronic   COPD (chronic obstructive pulmonary disease) ([place])   Essential hypertension   Personal history of stroke with current residual effects   Wheelchair bound   Bacteremia due to Gram-positive bacteria   Bacteremia   Hypercalcemia   Confusion   Frailty   Advanced care planning/counseling discussion   Goals of care, counseling/discussion   Lymphadenopathy   Palliative care by specialist   Oral candida  Brief Summary: 86 y.o. female with medical history significant for diastolic CHF, COPD, asthma, hypertension, CVA with residual left hemiparesis admitted on 04/17/20 with altered mentation/disorientation in the setting of presumed UTI and GPC bacteremia altered mentation and disorientation likely secondary to hypercalcemia of malignancy.   A/p 1)Acute Metabolic Encephalopathy--RESOLVED now.  Pt is alert and answering questions and more talkative today.  This was likely due to hypercalcemia that has now been successfully treated.   Pt also has AKI and presumed UTI that  has been treated.    She is not eating and drinking well and prognosis remains guarded.  --Anticipate improvement with improvement of listed conditions --As per family patient is still not back to baseline from a cognitive standpoint  Hypercalcemia (likely from malignancy)  - TREATED s/p zoledronic acid x 1 dose, plus calcitonin and IV fluids, Calcium down to 9.5.   Unfortunately she likely has an occult malignancy a lymphoproliferative disorder.  I reviewed oncology recommendations with family.  Biopsy is not recommended given advanced age and comorbidities.  Palliative care was recommended and family verbalized understanding and is agreeable.  PTH is low at 14.    Hypernatremia - TREATED with free water.   2)CONS bacteremia--contaminants--- antibiotics discontinued-No fevers, no tachypnea, no tachycardia, no leukocytosis, lactic acid is not elevated---patient does Not meet sepsis criteria  3) Pseudomonas-UTI--- continue IV cefepime started on 04/19/2020 last dose 04/24/2020. TREATED.   4)AKI----IMPROVING.  Acute kidney injury due to dehydration and concomitant meloxicam use.  Had improved with hydration but since not eating and drinking she has developed a recurrence of AKI. IV fluids reordered 7/21.  Some improvement noted 7/22.  Continue IV fluids as she is not eating or drinking well.  -- creatinine on admission= 1.55,  baseline creatinine = 1.1 (10/27/19)   -Creatinine is down to 1.1 ---renally adjust medications, avoid nephrotoxic agents / dehydration  / hypotension   5)Poor Venous Access--limiting ability to give IV fluids and IV antibiotics-had ultrasound assisted midline placement to  the left arm  6) ambulatory dysfunction--- at baseline patient was able to stand and pivot but she gets around with a wheelchair she does not walk -PT eval appreciated, SNF recommended  -PT/OT evaluations performed. SNF Rehab recommended. SNF appropriate as the patient has received 3 days of hospital care (or  the 3 day period has been waived by the patient's insurance company) and is felt to need rehab services to restore this patient to their prior level of function to achieve safe transition back to home care. This patient needs rehab services for at least 5 days per week and skilled nursing services daily to facilitate this transition. Rehab is being requested as the most appropriate d/c option for this patient and is NOT felt to be for custodial care as evidenced by previously able to stand, pivot and help with transfers-- prior to admission (comment on the patient's prior level of function). -She was requiring assist of 1 with ADLs prior to admission  7)Prior history of stroke more than 5 years ago with residual left-sided hemiparesis--- please see #6 above -Continue aspirin and crestor for secondary stroke prophylaxis - 8)Depression--- stable continue Cymbalta, and trazodone  9)Dysphagia--speech and swallow evaluation appreciated, recommends dysphagia 3 (Mech soft);Nectar-thick liquid  --Did very very poorly with swallow eval/MBS on 04/23/2020 please see full report  10) hypokalemia/Hypomagnesiemia---replace and recheck  11)Social/Ethics---  GOC--- severe dysphagia with aspiration with most consistencies, essentially wheelchair-bound status----- further conversations with family about realistic expectations and overall prognosis- --requesting palliative care to help with further conversations about advanced directives and kindly help further delineate goals of care and expectations Pt is DNR and family considering home with hospice versus [facility] with hospice support.   Disposition/Need for in-Hospital Stay- patient unable to be discharged at this time due to [employer] approval received to go to SNF rehab, family undecided. Considering home with hospice.   Status is: Inpatient  Remains inpatient appropriate because:Family deciding disposition, treate and working up hypercalcemia with IV  zaledronic acid and calcitonin.  Ongoing goals of care discussions and oncology consult regarding hilar adenopathy.   Disposition: The patient is from: Home              Anticipated d/c is to: SNF vs home with hospice (family remains undecided)              Anticipated d/c date is: 1-2 days              Patient currently is not medically stable to d/c. Barriers: - - working with family to decide disposition, they promise to get back with me in next 1-2 days regarding home with hospice vs SNF with hospice.   Procedures:- --Swallowing difficulties persist, patient did poorly poorly with swallow eval/MBS on 04/23/20  Code Status : after discussion with grandson today and reviewing oncology recommendations, he is agreeable to DNR status. I am changing order today.  He is going to get back at me this weekend about his decision about going to [facility] with hospice versus home with hospice.    Family Communication: discussed with grandson on telephone 7/22, 7/23  Consults  : Palliative care  DVT Prophylaxis  :   - Heparin - SCDs   Lab Results  Component Value Date   PLT 291 04/20/2020   Inpatient Medications  Scheduled Meds: . amLODipine  5 mg Oral Daily  . aspirin EC  81 mg Oral Daily  . chlorhexidine  15 mL Mouth Rinse BID  . DULoxetine  60 mg Oral Daily  . heparin  5,000 Units Subcutaneous Q8H  . magic mouthwash  10 mL Oral TID  . mouth rinse  15 mL Mouth Rinse q12n4p  . oxybutynin  10 mg Oral QHS  . pantoprazole  40 mg Oral Q1500  . rosuvastatin  5 mg Oral QPM  . sodium chloride flush  10-40 mL Intracatheter Q12H  . traZODone  50 mg Oral QHS   Continuous Infusions: . 0.45 % NaCl with KCl 20 mEq / L 40 mL/hr at 04/26/20 1821  . potassium chloride     PRN Meds:.acetaminophen **OR** acetaminophen, albuterol, labetalol, ondansetron **OR** ondansetron (ZOFRAN) IV, polyethylene glycol, sodium chloride flush  Anti-infectives (From admission, onward)   Start     Dose/Rate Route  Frequency Ordered Stop   04/19/20 2000  ceFEPIme (MAXIPIME) 1 g in sodium chloride 0.9 % 100 mL IVPB        1 g 200 mL/hr over 30 Minutes Intravenous Every 24 hours 04/19/20 1949 04/24/20 2121   04/18/20 1645  cefTRIAXone (ROCEPHIN) 2 g in sodium chloride 0.9 % 100 mL IVPB  Status:  Discontinued        2 g 200 mL/hr over 30 Minutes Intravenous Every 24 hours 04/18/20 1625 04/19/20 1928   04/17/20 1530  cefTRIAXone (ROCEPHIN) 1 g in sodium chloride 0.9 % 100 mL IVPB  Status:  Discontinued        1 g 200 mL/hr over 30 Minutes Intravenous Every 24 hours 04/17/20 1517 04/17/20 2212       Objective:   Vitals:   04/26/20 0522 04/26/20 1817 04/26/20 2159 04/27/20 0626  BP: (!) 142/101 (!) 130/72 (!) 109/59 91/69  Pulse: 99 89 79 76  Resp: 18 18 18 16   Temp: 98.8 F (37.1 C) 98.5 F (36.9 C) 97.8 F (36.6 C) 97.7 F (36.5 C)  TempSrc: Oral Oral Axillary Axillary  SpO2: 100% 98% 99% 94%  Weight:      Height:        Wt Readings from Last 3 Encounters:  04/24/20 72.3 kg  02/21/20 83.5 kg  02/21/20 83.5 kg   No intake or output data in the 24 hours ending 04/27/20 1403 Physical Exam Gen:- Awake , more alert today,  not eating or drinking well  HEENT:- [place]/AT, No sclera icterus Neck-Supple Neck,No JVD.  Lungs-diminished in bases, no wheezing  CV- S1, S2 normal, regular  Abd-  +ve B.Sounds, Abd Soft, No tenderness, no CVA area tenderness    Extremity/Skin:- No  edema, pedal pulses present  Psych-affect is intermittently confused with intermittent disorientation neuro-residual left-sided hemiparesis, no new focal deficits, no tremors   Data Review:   Micro Results Recent Results (from the past 240 hour(s))  Urine culture     Status: Abnormal   Collection Time: 04/17/20  2:34 PM   Specimen: In/Out Cath Urine  Result Value Ref Range Status   Specimen Description   Final    IN/OUT CATH URINE Performed at [facility], [address] [place], [place] [address]    Special  Requests   Final    NONE Performed at [facility], [address], [place], [place] [address]    Culture 30,000 COLONIES/mL PSEUDOMONAS AERUGINOSA (A)  Final   Report Status 04/20/2020 FINAL  Final   Organism ID, Bacteria PSEUDOMONAS AERUGINOSA (A)  Final      Susceptibility   Pseudomonas aeruginosa - MIC*    CEFTAZIDIME 2 SENSITIVE Sensitive     CIPROFLOXACIN <=0.25 SENSITIVE Sensitive  GENTAMICIN <=1 SENSITIVE Sensitive     IMIPENEM 1 SENSITIVE Sensitive     PIP/TAZO 8 SENSITIVE Sensitive     CEFEPIME 2 SENSITIVE Sensitive     * 30,000 COLONIES/mL PSEUDOMONAS AERUGINOSA  SARS Coronavirus 2 by RT PCR (hospital order, performed in [place] hospital lab) Nasopharyngeal Nasopharyngeal Swab     Status: None   Collection Time: 04/17/20  3:43 PM   Specimen: Nasopharyngeal Swab  Result Value Ref Range Status   SARS Coronavirus 2 NEGATIVE NEGATIVE Final    Comment: (NOTE) SARS-CoV-2 target nucleic acids are NOT DETECTED.  The SARS-CoV-2 RNA is generally detectable in upper and lower respiratory specimens during the acute phase of infection. The lowest concentration of SARS-CoV-2 viral copies this assay can detect is 250 copies / mL. A negative result does not preclude SARS-CoV-2 infection and should not be used as the sole basis for treatment or other patient management decisions.  A negative result may occur with improper specimen collection / handling, submission of specimen other than nasopharyngeal swab, presence of viral mutation(s) within the areas targeted by this assay, and inadequate number of viral copies (<250 copies / mL). A negative result must be combined with clinical observations, patient history, and epidemiological information.  Fact Sheet for Patients:   StrictlyIdeas.no  Fact Sheet for Healthcare Providers: BankingDealers.co.za  This test is not yet approved or  cleared by the [place] FDA and has been  authorized for detection and/or diagnosis of SARS-CoV-2 by FDA under an Emergency Use Authorization (EUA).  This EUA will remain in effect (meaning this test can be used) for the duration of the COVID-19 declaration under Section 564(b)(1) of the Act, 21 U.S.C. section 360bbb-3(b)(1), unless the authorization is terminated or revoked sooner.  Performed at [facility], [address], [place], [place] [address]   Blood Culture (routine x 2)     Status: None   Collection Time: 04/17/20  4:19 PM   Specimen: Right Antecubital; Blood  Result Value Ref Range Status   Specimen Description RIGHT ANTECUBITAL  Final   Special Requests   Final    BOTTLES DRAWN AEROBIC AND ANAEROBIC Blood Culture adequate volume   Culture   Final    NO GROWTH 5 DAYS Performed at [facility], [address], [place], [place] [address]    Report Status 04/22/2020 FINAL  Final  Blood Culture (routine x 2)     Status: Abnormal   Collection Time: 04/17/20  4:24 PM   Specimen: Left Antecubital; Blood  Result Value Ref Range Status   Specimen Description   Final    LEFT ANTECUBITAL Performed at [facility], [address], [place], [place] [address]    Special Requests   Final    BOTTLES DRAWN AEROBIC AND ANAEROBIC Blood Culture adequate volume Performed at [facility], [address] [place], [place] [address]    Culture  Setup Time   Final    BOTH BOTTLES GRAM POSITIVE COCCI Gram Stain Report Called to,Read Back By and Verified With: Lowry Ram 04/18/20 @1619  BY JONEST  CRITICAL RESULT CALLED TO, READ BACK BY AND VERIFIED WITH: D,JONES RN @2257  04/18/20 EB    Culture (A)  Final    STAPHYLOCOCCUS EPIDERMIDIS THE SIGNIFICANCE OF ISOLATING THIS ORGANISM FROM A SINGLE SET OF BLOOD CULTURES WHEN MULTIPLE SETS ARE DRAWN IS UNCERTAIN. PLEASE NOTIFY THE MICROBIOLOGY DEPARTMENT WITHIN ONE WEEK IF SPECIATION AND SENSITIVITIES ARE REQUIRED. Performed at [facility], [place] [address] [place], [place]  [address]    Report Status 04/20/2020 FINAL  Final  Blood Culture ID Panel (Reflexed)     Status: Abnormal   Collection Time: 04/17/20  4:24 PM  Result Value Ref Range Status   Enterococcus species NOT DETECTED NOT DETECTED Final   Listeria monocytogenes NOT DETECTED NOT DETECTED Final   Staphylococcus species DETECTED (A) NOT DETECTED Final    Comment: Methicillin (oxacillin) resistant coagulase negative staphylococcus. Possible blood culture contaminant (unless isolated from more than one blood culture draw or clinical case suggests pathogenicity). No antibiotic treatment is indicated for blood  culture contaminants. CRITICAL RESULT CALLED TO, READ BACK BY AND VERIFIED WITH: D,JONES RN @2257  04/18/20 EB    Staphylococcus aureus (BCID) NOT DETECTED NOT DETECTED Final   Methicillin resistance DETECTED (A) NOT DETECTED Final    Comment: CRITICAL RESULT CALLED TO, READ BACK BY AND VERIFIED WITH: D,JONES RN @2257  04/18/20 EB    Streptococcus species NOT DETECTED NOT DETECTED Final   Streptococcus agalactiae NOT DETECTED NOT DETECTED Final   Streptococcus pneumoniae NOT DETECTED NOT DETECTED Final   Streptococcus pyogenes NOT DETECTED NOT DETECTED Final   Acinetobacter baumannii NOT DETECTED NOT DETECTED Final   Enterobacteriaceae species NOT DETECTED NOT DETECTED Final   Enterobacter cloacae complex NOT DETECTED NOT DETECTED Final   Escherichia coli NOT DETECTED NOT DETECTED Final   Klebsiella oxytoca NOT DETECTED NOT DETECTED Final   Klebsiella pneumoniae NOT DETECTED NOT DETECTED Final   Proteus species NOT DETECTED NOT DETECTED Final   Serratia marcescens NOT DETECTED NOT DETECTED Final   Haemophilus influenzae NOT DETECTED NOT DETECTED Final   Neisseria meningitidis NOT DETECTED NOT DETECTED Final   Pseudomonas aeruginosa NOT DETECTED NOT DETECTED Final   Candida albicans NOT DETECTED NOT DETECTED Final   Candida glabrata NOT DETECTED NOT DETECTED Final   Candida krusei NOT DETECTED  NOT DETECTED Final   Candida parapsilosis NOT DETECTED NOT DETECTED Final   Candida tropicalis NOT DETECTED NOT DETECTED Final    Comment: Performed at [facility], [place]. [address], [place], [place] [address]    Radiology Reports DG Chest 1 View  Result Date: 04/17/2020 CLINICAL DATA:  Chest pain, altered mental status EXAM: CHEST  1 VIEW COMPARISON:  02/21/2020 FINDINGS: The generalized lung apices, slightly limiting evaluation. The lungs are symmetrically expanded and are clear. No definite pneumothorax. No pleural effusion. The cardiomediastinal silhouette is unremarkable. Pulmonary vascularity is normal. Surgical clips are noted within the axilla bilaterally. No acute bone abnormality IMPRESSION: No active disease. Electronically Signed   By: Fidela Salisbury MD   On: 04/17/2020 15:11   CT Head Wo Contrast  Result Date: 04/17/2020 CLINICAL DATA:  Delirium EXAM: CT HEAD WITHOUT CONTRAST TECHNIQUE: Contiguous axial images were obtained from the base of the skull through the vertex without intravenous contrast. COMPARISON:  Feb 21, 2020 FINDINGS: Brain: No evidence of acute territorial infarction, hemorrhage, hydrocephalus,extra-axial collection or mass lesion/mass effect. There is dilatation the ventricles and sulci consistent with age-related atrophy. Low-attenuation changes in the deep white matter consistent with small vessel ischemia. Prior lacunar infarct seen within the right basal ganglia. Vascular: No hyperdense vessel or unexpected calcification. Skull: The skull is intact. No fracture or focal lesion identified. Sinuses/Orbits: The visualized paranasal sinuses and mastoid air cells are clear. The orbits and globes intact. Other: None IMPRESSION: No acute intracranial abnormality. Findings consistent with age related atrophy and chronic small vessel ischemia Electronically Signed   By: Prudencio Pair M.D.   On: 04/17/2020 17:00  CT CHEST W CONTRAST  Result Date: 04/24/2020 CLINICAL  DATA:  Pathologic lymphadenopathy EXAM: CT CHEST, ABDOMEN, AND PELVIS WITH CONTRAST TECHNIQUE: Multidetector CT imaging of the chest, abdomen and pelvis was performed following the standard protocol during bolus administration of intravenous contrast. CONTRAST:  33mL OMNIPAQUE IOHEXOL 300 MG/ML  SOLN COMPARISON:  CT chest 02/21/2020, CT abdomen pelvis 04/22/2015 FINDINGS: CT CHEST FINDINGS Cardiovascular: Extensive multi-vessel coronary artery calcification again noted. Global cardiac size within normal limits. Small pericardial effusion is again noted without CT evidence of cardiac tamponade. The pericardial effusion demonstrates relatively bland appearing fluid in demonstrates no pericardial enhancement suggesting a transudative effusion. The central pulmonary arteries are enlarged in keeping with changes of pulmonary arterial hypertension. Extensive atherosclerotic calcification is seen within the thoracic aorta without aneurysm. Mediastinum/Nodes: Multiple thyroid nodules are again identified. There is pathologic supraclavicular mediastinal adenopathy identified, though left hilar and subcarinal adenopathy appears to have improved in the interval since prior examination. By example, left supraclavicular lymph node measures 13 mm x 16 mm at axial image # 5/2. Precarinal lymph node measures 19 mm x 22 mm at axial image # 18/2. Additional pathologic adenopathy is noted within the prevascular, aortopulmonary, lymph node groups. Shotty adenopathy is seen within the left axillary lymph node group, though this does not appear pathologically enlarged. Bilateral axillary lymph node dissection has been performed. Right mastectomy has been performed. Lungs/Pleura: Left basilar atelectasis again noted. Focal infiltrate within the lingula noted on prior examination has resolved. 4 mm pulmonary nodule within the right apex is new from prior examination and is nonspecific, but likely infectious or inflammatory given its rapid  development since prior examination. No new focal pulmonary nodules or infiltrates are identified. Linear atelectasis within the right middle lobe noted. No pneumothorax or pleural effusion. The central airways are patent. Musculoskeletal: Subacute distal right clavicle fracture again noted. CT ABDOMEN PELVIS FINDINGS Hepatobiliary: Liver is unremarkable. Mild extrahepatic biliary ductal dilation is within acceptable limits for age. Cholelithiasis without CT evidence of acute cholecystitis. Pancreas: Unremarkable Spleen: Unremarkable Adrenals/Urinary Tract: The adrenal glands are unremarkable. There is marked, chronic atrophy the left kidney. Compensatory hypertrophy of the right kidney which is unremarkable. The bladder is unremarkable. Stomach/Bowel: Large volume stool within the rectal vault. Severe sigmoid diverticulosis. Stomach and small bowel are unremarkable. No free intraperitoneal gas or fluid. Vascular/Lymphatic: There is pathologic left periaortic and right common iliac lymphadenopathy. Borderline enlarged lymph node is noted within the right inguinal region. By example, 19 mm right common iliac lymph node is seen at axial image # 78/2. Borderline right inguinal node measures 13 mm x 26 mm at axial image # 99/2. There is extensive aortoiliac atherosclerotic calcification present. Particularly prominent calcification noted at the right renal artery ostium. A saccular aneurysm of the infrarenal abdominal aorta is identified measuring 19 mm x 30 mm in axial image # 66 enlarged since prior examination where this measured 19 mm by 24 mm. Reproductive: Uterus absent.  No adnexal masses. Other: Moderate presacral edema is nonspecific, but may represent inflammatory changes related to stercoral proctitis. Musculoskeletal: Degenerative changes are seen within the lumbar spine. No focal lytic or blastic bone lesions are seen. IMPRESSION: Pathologic left supraclavicular, mediastinal, retroperitoneal, and possible  right inguinal and right axillary adenopathy. Given the interval improvement in adenopathy involving the left hilum and subcarinal region, a waxing and waning inflammatory process should also be considered, such as sarcoidosis. Lymphoproliferative disorders, such as lymphoma or leukemia, are not excluded. PET CT examination may be helpful  to assess metabolic activity of these abnormal lymph nodes. If hypermetabolic, left supraclavicular and possible right inguinal adenopathy would provide easy targets for ultrasound-guided biopsy for tissue diagnosis. Electronically Signed   By: Fidela Salisbury MD   On: 04/24/2020 19:48   CT ABDOMEN PELVIS W CONTRAST  Result Date: 04/24/2020 CLINICAL DATA:  Pathologic lymphadenopathy EXAM: CT CHEST, ABDOMEN, AND PELVIS WITH CONTRAST TECHNIQUE: Multidetector CT imaging of the chest, abdomen and pelvis was performed following the standard protocol during bolus administration of intravenous contrast. CONTRAST:  36mL OMNIPAQUE IOHEXOL 300 MG/ML  SOLN COMPARISON:  CT chest 02/21/2020, CT abdomen pelvis 04/22/2015 FINDINGS: CT CHEST FINDINGS Cardiovascular: Extensive multi-vessel coronary artery calcification again noted. Global cardiac size within normal limits. Small pericardial effusion is again noted without CT evidence of cardiac tamponade. The pericardial effusion demonstrates relatively bland appearing fluid in demonstrates no pericardial enhancement suggesting a transudative effusion. The central pulmonary arteries are enlarged in keeping with changes of pulmonary arterial hypertension. Extensive atherosclerotic calcification is seen within the thoracic aorta without aneurysm. Mediastinum/Nodes: Multiple thyroid nodules are again identified. There is pathologic supraclavicular mediastinal adenopathy identified, though left hilar and subcarinal adenopathy appears to have improved in the interval since prior examination. By example, left supraclavicular lymph node measures 13 mm  x 16 mm at axial image # 5/2. Precarinal lymph node measures 19 mm x 22 mm at axial image # 18/2. Additional pathologic adenopathy is noted within the prevascular, aortopulmonary, lymph node groups. Shotty adenopathy is seen within the left axillary lymph node group, though this does not appear pathologically enlarged. Bilateral axillary lymph node dissection has been performed. Right mastectomy has been performed. Lungs/Pleura: Left basilar atelectasis again noted. Focal infiltrate within the lingula noted on prior examination has resolved. 4 mm pulmonary nodule within the right apex is new from prior examination and is nonspecific, but likely infectious or inflammatory given its rapid development since prior examination. No new focal pulmonary nodules or infiltrates are identified. Linear atelectasis within the right middle lobe noted. No pneumothorax or pleural effusion. The central airways are patent. Musculoskeletal: Subacute distal right clavicle fracture again noted. CT ABDOMEN PELVIS FINDINGS Hepatobiliary: Liver is unremarkable. Mild extrahepatic biliary ductal dilation is within acceptable limits for age. Cholelithiasis without CT evidence of acute cholecystitis. Pancreas: Unremarkable Spleen: Unremarkable Adrenals/Urinary Tract: The adrenal glands are unremarkable. There is marked, chronic atrophy the left kidney. Compensatory hypertrophy of the right kidney which is unremarkable. The bladder is unremarkable. Stomach/Bowel: Large volume stool within the rectal vault. Severe sigmoid diverticulosis. Stomach and small bowel are unremarkable. No free intraperitoneal gas or fluid. Vascular/Lymphatic: There is pathologic left periaortic and right common iliac lymphadenopathy. Borderline enlarged lymph node is noted within the right inguinal region. By example, 19 mm right common iliac lymph node is seen at axial image # 78/2. Borderline right inguinal node measures 13 mm x 26 mm at axial image # 99/2. There is  extensive aortoiliac atherosclerotic calcification present. Particularly prominent calcification noted at the right renal artery ostium. A saccular aneurysm of the infrarenal abdominal aorta is identified measuring 19 mm x 30 mm in axial image # 66 enlarged since prior examination where this measured 19 mm by 24 mm. Reproductive: Uterus absent.  No adnexal masses. Other: Moderate presacral edema is nonspecific, but may represent inflammatory changes related to stercoral proctitis. Musculoskeletal: Degenerative changes are seen within the lumbar spine. No focal lytic or blastic bone lesions are seen. IMPRESSION: Pathologic left supraclavicular, mediastinal, retroperitoneal, and possible right inguinal and right  axillary adenopathy. Given the interval improvement in adenopathy involving the left hilum and subcarinal region, a waxing and waning inflammatory process should also be considered, such as sarcoidosis. Lymphoproliferative disorders, such as lymphoma or leukemia, are not excluded. PET CT examination may be helpful to assess metabolic activity of these abnormal lymph nodes. If hypermetabolic, left supraclavicular and possible right inguinal adenopathy would provide easy targets for ultrasound-guided biopsy for tissue diagnosis. Electronically Signed   By: Fidela Salisbury MD   On: 04/24/2020 19:48   DG Swallowing Func-Speech Pathology  Result Date: 04/23/2020 Objective Swallowing Evaluation: Type of Study: MBS-Modified Barium Swallow Study  Patient Details Name: KESLEIGH MORSON MRN: [id number] Date of Birth: 09-Jun-1925 Today's Date: 04/23/2020 Time: SLP Start Time (ACUTE ONLY): 1353 -SLP Stop Time (ACUTE ONLY): 1418 SLP Time Calculation (min) (ACUTE ONLY): 25 min Past Medical History: Past Medical History: Diagnosis Date . Anemia 02/02/2014 . Anxiety  . Arthritis   knee . Asthma  . CAD (coronary artery disease) cardiologist-  dr Bronson Ing  ([facility] in [place])  2003  Cath with nonobstructive CAD . Chest  pain 08/14/2017 . Chronic headache  . Chronic lower back pain  . COPD (chronic obstructive pulmonary disease) ([place])  . Diastolic CHF, chronic ([place])  . Duodenal diverticulum  . Edema of left lower extremity  . Fibromyalgia  . Full dentures  . Gait disorder   uses walker . GERD (gastroesophageal reflux disease)  . Headache disorder 01/17/2015 . Heart murmur  . History of breast cancer  . History of Clostridium difficile  . History of colitis   2001 . History of CVA with residual deficit   06/ 2015  residual left hemiparesis . History of esophageal dilatation  . History of falling   last fall 06-25-2015 . History of hiatal hernia  . History of small bowel obstruction   04-25-2015  resolved without surgical intervention . Lesion of bladder  . Low vision, one eye   right eye . Macular degeneration of both eyes   right eye now low vision/  left eye getting injections currently . Malignant hypertension  . OAB (overactive bladder)  . Protein-calorie malnutrition, severe ([place]) 01/10/2015 . SBO (small bowel obstruction) ([place]) 04/22/2015 . Schatzki's ring  . Sciatica of right side 05/29/2015 . Syncope 08/14/2017 . Urge and stress incontinence  . Varicose veins  . Venous insufficiency, peripheral  . Vertigo  Past Surgical History: Past Surgical History: Procedure Laterality Date . [place]  from colon . ABDOMINAL HYSTERECTOMY  1954  w/ bilateral salpingoophorectomy . ANTERIOR CERVICAL DECOMP/DISCECTOMY FUSION  1971  "used bone off of my right hip" . BREAST LUMPECTOMY Left 2006 . CARDIAC CATHETERIZATION  11-29-2001  dr Tressia Miners turner  Non-obstructive CAD/  20% pLAD,  30% pD1, 50% mRCA/  normal LVF . CATARACT EXTRACTION W/ INTRAOCULAR LENS  IMPLANT, BILATERAL  right 2008//  left 2015 . CYSTOSCOPY WITH HYDRODISTENSION AND BIOPSY N/A 07/23/2015  Procedure: CYSTOSCOPY;HYDRODISTENSION;  Surgeon: Irine Seal, MD;  [facility];  Service: Urology;  Laterality: N/A; . DILATION AND CURETTAGE OF  UTERUS  1953   miscarriage . [place] . KNEE ARTHROSCOPY Right 2005 . MASTECTOMY, PARTIAL Right 1999 . OVARIAN CYST REMOVAL  1946 . PATELLA FRACTURE SURGERY Left 2008 . TRANSTHORACIC ECHOCARDIOGRAM  01-09-2015  mild LVH,  ef 78-24%, grade I diastolic dysfunction/  mild MR/  trivial pericardial effusion was identified HPI: Gwendlyon H. Rotenberg is a 86 y.o. female with medical history significant for diastolic  CHF, COPD, asthma, hypertension, CVA with residual left hemiparesis admitted on 04/17/20 with altered mentation/disorientation in the setting of presumed UTI and GPC bacteremia altered mentation and disorientation in the setting of presumed UTI and GPC bacteremia.Pt was placed on D3 and NTL on 7/16; MBS ordered to objectively assess the swallowing function.  No data recorded Assessment / Plan / Recommendation CHL IP CLINICAL IMPRESSIONS 04/23/2020 Clinical Impression Pt presents with severe oropharyngeal dysphagia compounded by altered mental status; swallowing is characterized by poor awareness of bolus, severe anterior spillage, poor bolus cohesion, prolonged AP transit, lingual pumping, premature spillage pooling into the pyriforms, delayed swallowing trigger, penetration of all liquids (thin, NTL & HTL), and trace to mod SILENT aspiration (with thin, NTL & HTL) (this was sensed X2 out of multiple episodes of aspiration). Note decreased pharyngeal squeeze and decreased laryngeal vestibule closure. At this time, Pt is cognitively unable to consistently participate in strategies however she naturally remains in the position of a chin tuck which was effective in decreasing/eliminating aspiration with tsp sips of HTL. No penetration/aspiration was noted with puree or solid textures however solid texture trials were limited secondary to fatigue. Pt is at VERY HIGH risk of aspiration as trace-mod aspiration was noted with all liquid textures however least restrictive diet is D2/fine chop and HTL  (administered with tsp); recommend precautions: small bites/sips, Pt should be seated upright, chin tuck (natural positioning) is ok and recommended for swallowing. Also note Pt is at risk for inadequate nutrition and dehydration. Pt is a good candidate for free water protocol (oral care in between meals followed by ice chips or WATER by teaspoon). Recommend consider repeat MBS after Pt's mentation has improved, subjective upgrade will be challenging secondary to multiple episodes of silent aspiration. ST will continue to follow acutely SLP Visit Diagnosis Dysphagia, unspecified (R13.10) Attention and concentration deficit following -- Frontal lobe and executive function deficit following -- Impact on safety and function Mild aspiration risk;Moderate aspiration risk   CHL IP TREATMENT RECOMMENDATION 04/23/2020 Treatment Recommendations Therapy as outlined in treatment plan below   Prognosis 04/23/2020 Prognosis for Safe Diet Advancement Fair Barriers to Reach Goals Cognitive deficits;Severity of deficits Barriers/Prognosis Comment -- CHL IP DIET RECOMMENDATION 04/23/2020 SLP Diet Recommendations Dysphagia 2 (Fine chop) solids;Honey thick liquids Liquid Administration via Spoon Medication Administration Whole meds with puree Compensations Minimize environmental distractions;Slow rate;Small sips/bites;Monitor for anterior loss;Multiple dry swallows after each bite/sip;Clear throat after each swallow;Clear throat intermittently Postural Changes Remain semi-upright after after feeds/meals (Comment);Seated upright at 90 degrees   CHL IP OTHER RECOMMENDATIONS 04/23/2020 Recommended Consults -- Oral Care Recommendations Oral care BID Other Recommendations Order thickener from pharmacy   CHL IP FOLLOW UP RECOMMENDATIONS 04/23/2020 Follow up Recommendations 24 hour supervision/assistance;Skilled Nursing facility   [facility] IP FREQUENCY AND DURATION 04/23/2020 Speech Therapy Frequency (ACUTE ONLY) min 1 x/week Treatment Duration 1 week       CHL IP ORAL PHASE 04/23/2020 Oral Phase Impaired Oral - Pudding Teaspoon -- Oral - Pudding Cup -- Oral - Honey Teaspoon Left anterior bolus loss;Right anterior bolus loss;Impaired mastication;Weak lingual manipulation;Lingual pumping;Reduced posterior propulsion;Holding of bolus;Lingual/palatal residue;Piecemeal swallowing;Delayed oral transit;Decreased bolus cohesion;Premature spillage Oral - Honey Cup Left anterior bolus loss;Right anterior bolus loss;Impaired mastication;Weak lingual manipulation;Lingual pumping;Reduced posterior propulsion;Holding of bolus;Lingual/palatal residue;Piecemeal swallowing;Delayed oral transit;Decreased bolus cohesion;Premature spillage Oral - Nectar Teaspoon Left anterior bolus loss;Right anterior bolus loss;Impaired mastication;Weak lingual manipulation;Lingual pumping;Reduced posterior propulsion;Holding of bolus;Lingual/palatal residue;Piecemeal swallowing;Delayed oral transit;Decreased bolus cohesion;Premature spillage Oral - Nectar Cup Left anterior bolus loss;Right anterior  bolus loss;Impaired mastication;Weak lingual manipulation;Lingual pumping;Reduced posterior propulsion;Holding of bolus;Lingual/palatal residue;Piecemeal swallowing;Delayed oral transit;Decreased bolus cohesion;Premature spillage Oral - Nectar Straw Left anterior bolus loss;Right anterior bolus loss;Impaired mastication;Weak lingual manipulation;Lingual pumping;Reduced posterior propulsion;Holding of bolus;Lingual/palatal residue;Piecemeal swallowing;Delayed oral transit;Decreased bolus cohesion;Premature spillage Oral - Thin Teaspoon Left anterior bolus loss;Right anterior bolus loss;Impaired mastication;Weak lingual manipulation;Lingual pumping;Reduced posterior propulsion;Holding of bolus;Lingual/palatal residue;Piecemeal swallowing;Delayed oral transit;Decreased bolus cohesion;Premature spillage Oral - Thin Cup Left anterior bolus loss;Right anterior bolus loss;Impaired mastication;Weak lingual  manipulation;Lingual pumping;Reduced posterior propulsion;Holding of bolus;Lingual/palatal residue;Piecemeal swallowing;Delayed oral transit;Decreased bolus cohesion;Premature spillage Oral - Thin Straw -- Oral - Puree Left anterior bolus loss;Right anterior bolus loss;Impaired mastication;Weak lingual manipulation;Lingual pumping;Reduced posterior propulsion;Holding of bolus;Lingual/palatal residue;Piecemeal swallowing;Delayed oral transit;Decreased bolus cohesion;Premature spillage Oral - Mech Soft -- Oral - Regular Left anterior bolus loss;Right anterior bolus loss;Impaired mastication;Weak lingual manipulation;Lingual pumping;Reduced posterior propulsion;Holding of bolus;Lingual/palatal residue;Piecemeal swallowing;Delayed oral transit;Decreased bolus cohesion;Premature spillage Oral - Multi-Consistency -- Oral - Pill -- Oral Phase - Comment --  CHL IP PHARYNGEAL PHASE 04/23/2020 Pharyngeal Phase Impaired Pharyngeal- Pudding Teaspoon -- Pharyngeal -- Pharyngeal- Pudding Cup -- Pharyngeal -- Pharyngeal- Honey Teaspoon Delayed swallow initiation-vallecula;Delayed swallow initiation-pyriform sinuses;Reduced pharyngeal peristalsis;Reduced airway/laryngeal closure;Penetration/Aspiration during swallow;Penetration/Apiration after swallow;Moderate aspiration;Pharyngeal residue - valleculae;Pharyngeal residue - pyriform;Lateral channel residue Pharyngeal Material enters airway, passes BELOW cords without attempt by patient to eject out (silent aspiration) Pharyngeal- Honey Cup Delayed swallow initiation-vallecula;Delayed swallow initiation-pyriform sinuses;Reduced pharyngeal peristalsis;Reduced airway/laryngeal closure;Penetration/Aspiration during swallow;Penetration/Apiration after swallow;Moderate aspiration;Pharyngeal residue - valleculae;Pharyngeal residue - pyriform;Lateral channel residue Pharyngeal Material enters airway, passes BELOW cords without attempt by patient to eject out (silent aspiration) Pharyngeal-  Nectar Teaspoon -- Pharyngeal -- Pharyngeal- Nectar Cup Delayed swallow initiation-vallecula;Delayed swallow initiation-pyriform sinuses;Reduced pharyngeal peristalsis;Reduced airway/laryngeal closure;Penetration/Aspiration during swallow;Penetration/Apiration after swallow;Moderate aspiration;Pharyngeal residue - valleculae;Pharyngeal residue - pyriform;Lateral channel residue Pharyngeal Material enters airway, passes BELOW cords without attempt by patient to eject out (silent aspiration) Pharyngeal- Nectar Straw Delayed swallow initiation-vallecula;Delayed swallow initiation-pyriform sinuses;Reduced pharyngeal peristalsis;Reduced airway/laryngeal closure;Penetration/Aspiration during swallow;Penetration/Apiration after swallow;Moderate aspiration;Pharyngeal residue - valleculae;Pharyngeal residue - pyriform;Lateral channel residue Pharyngeal Material enters airway, passes BELOW cords without attempt by patient to eject out (silent aspiration) Pharyngeal- Thin Teaspoon Delayed swallow initiation-vallecula;Delayed swallow initiation-pyriform sinuses;Reduced pharyngeal peristalsis;Reduced airway/laryngeal closure;Penetration/Aspiration during swallow;Penetration/Apiration after swallow;Moderate aspiration;Pharyngeal residue - valleculae;Pharyngeal residue - pyriform;Lateral channel residue Pharyngeal Material enters airway, passes BELOW cords without attempt by patient to eject out (silent aspiration) Pharyngeal- Thin Cup Delayed swallow initiation-vallecula;Delayed swallow initiation-pyriform sinuses;Reduced pharyngeal peristalsis;Reduced airway/laryngeal closure;Penetration/Aspiration during swallow;Penetration/Apiration after swallow;Moderate aspiration;Pharyngeal residue - valleculae;Pharyngeal residue - pyriform;Lateral channel residue Pharyngeal Material enters airway, passes BELOW cords without attempt by patient to eject out (silent aspiration) Pharyngeal- Thin Straw NT Pharyngeal -- Pharyngeal- Puree Delayed  swallow initiation-vallecula Pharyngeal -- Pharyngeal- Mechanical Soft -- Pharyngeal -- Pharyngeal- Regular Delayed swallow initiation-vallecula Pharyngeal -- Pharyngeal- Multi-consistency -- Pharyngeal -- Pharyngeal- Pill -- Pharyngeal -- Pharyngeal Comment pronounce cricopharyngeus  No flowsheet data found. Amelia H. Roddie Mc, CCC-SLP Speech Language Pathologist Wende Bushy 04/23/2020, 3:34 PM              [place] EKG SITE RITE  Result Date: 04/18/2020 If Site Rite image not attached, placement could not be confirmed due to current cardiac rhythm.    CBC No results for input(s): WBC, HGB, HCT, PLT, MCV, MCH, MCHC, RDW, LYMPHSABS, MONOABS, EOSABS, BASOSABS, BANDABS in the last 168 hours.  Invalid input(s): NEUTRABS, [place]  Recent Labs  Lab 04/21/20 5885 04/21/20 0277 04/22/20 0701 04/23/20 0436 04/24/20 0900 04/25/20 4128 04/26/20 0915 04/27/20 7867  NA 140   < > 144  --  146* 144 143 142  K 2.6*   < > 3.5  --  2.8* 3.3* 3.3* 3.5  CL 104   < > 108  --  106 106 109 109  CO2 28   < > 28  --  29 26 25 25   GLUCOSE 100*   < > 106*  --  113* 112* 102* 99  BUN 18   < > 17  --  30* 32* 31* 30*  CREATININE 1.21*   < > 1.18*  --  1.46* 1.35* 1.10* 1.04*  CALCIUM 12.3*   < > 12.5*  --  12.8* 11.0* 9.8 9.5  MG 1.5*  --   --  2.0 1.9  --  1.6* 2.0  AST  --   --   --   --  32  --   --   --   ALT  --   --   --   --  27  --   --   --   ALKPHOS  --   --   --   --  104  --   --   --   BILITOT  --   --   --   --  0.5  --   --   --    < > = values in this interval not displayed.   ------------------------------------------------------------------------------------------------------------------ No results for input(s): CHOL, HDL, LDLCALC, TRIG, CHOLHDL, LDLDIRECT in the last 72 hours.  No results found for: HGBA1C ------------------------------------------------------------------------------------------------------------------ No results for input(s): TSH, T4TOTAL, T3FREE,  THYROIDAB in the last 72 hours.  Invalid input(s): FREET3 ------------------------------------------------------------------------------------------------------------------ No results for input(s): VITAMINB12, FOLATE, FERRITIN, TIBC, IRON, RETICCTPCT in the last 72 hours.  Coagulation profile No results for input(s): INR, PROTIME in the last 168 hours.  No results for input(s): DDIMER in the last 72 hours.  Cardiac Enzymes No results for input(s): CKMB, TROPONINI, MYOGLOBIN in the last 168 hours.  Invalid input(s): CK ------------------------------------------------------------------------------------------------------------------    Component Value Date/Time   BNP 21.0 02/21/2020 1614   Lavoris Canizales M.D on 04/27/2020 at 2:03 PM  Go to [email or web address] - for contact info How to contact the [facility] Attending or Consulting provider [place] or covering provider during after hours [place], for this patient?  1. Check the care team in [facility] and look for a) attending/consulting TRH provider listed and b) the [facility] team listed 2. Log into [email or web address] and use [place]'s universal password to access. If you do not have the password, please contact the hospital operator. 3. Locate the [facility] provider you are looking for under [facility] and page to a number that you can be directly reached. 4. If you still have difficulty reaching the provider, please page the [facility] (Director on Call) for the Hospitalists listed on amion for assistance.

## 2020-04-27 NOTE — Plan of Care (Signed)
  Problem: Clinical Measurements: Goal: Ability to maintain clinical measurements within normal limits will improve Outcome: Progressing   

## 2020-04-28 NOTE — Progress Notes (Signed)
04/28/2020 4:24 PM   I had discussion with patient's grandson yesterday evening and he had informed me that he had been in discussions with patient's family and they have decided to start palliative care and comfort measures.  They would like for patient to go to [facility] with hospice if possible.  Pt was placed on comfort measures 04/27/20.    Today's Vitals   04/27/20 1456 04/27/20 2109 04/27/20 2300 04/28/20 1433  BP: (!) 128/64   (!) 122/62  Pulse: 70   72  Resp: 16   17  Temp: 98.2 F (36.8 C)     TempSrc: Axillary     SpO2: 98% 97%  96%  Weight:      Height:      PainSc:   3     Body mass index is 27.36 kg/m.  Pt is comfortable and resting in bed.  She is eating small amounts at a time. She is drinking sips.  She remains relatively dysphagic with coughing spells after sips.   She denies being in pain.  She would like for family to visit wit her. I have liberalized visitation per hospital policy for comfort care patients.     I have reached out to [facility] for assistance with disposition.    Continue current management.  Palliative care team will be available to assist on 04/29/20.   Murvin Natal MD  How to contact the [facility] Attending or Consulting provider [place] or covering provider during after hours [place], for this patient?  1. Check the care team in [facility] and look for a) attending/consulting TRH provider listed and b) the [facility] team listed 2. Log into [email or web address] and use [address]'s universal password to access. If you do not have the password, please contact the hospital operator. 3. Locate the [facility] provider you are looking for under [facility] and page to a number that you can be directly reached. 4. If you still have difficulty reaching the provider, please page the [facility] (Director on Call) for the Hospitalists listed on amion for assistance.

## 2020-04-28 NOTE — Plan of Care (Signed)
  Problem: Education: Goal: Knowledge of General Education information will improve Description: Including pain rating scale, medication(s)/side effects and non-pharmacologic comfort measures Outcome: Not Progressing   Problem: Health Behavior/Discharge Planning: Goal: Ability to manage health-related needs will improve Outcome: Not Progressing   

## 2020-04-29 ENCOUNTER — Inpatient Hospital Stay (HOSPITAL_COMMUNITY)
Admission: RE | Admit: 2020-04-29 | Discharge: 2020-04-30 | DRG: 951 | Disposition: A | Discharge status: Hospice | Source: Hospice | Attending: Family Medicine | Admitting: Family Medicine

## 2020-04-29 DIAGNOSIS — Z981 Arthrodesis status: Secondary | ICD-10-CM

## 2020-04-29 DIAGNOSIS — Z515 Encounter for palliative care: Secondary | ICD-10-CM

## 2020-04-29 DIAGNOSIS — Z881 Allergy status to other antibiotic agents status: Secondary | ICD-10-CM

## 2020-04-29 DIAGNOSIS — I2510 Atherosclerotic heart disease of native coronary artery without angina pectoris: Secondary | ICD-10-CM | POA: Diagnosis present

## 2020-04-29 DIAGNOSIS — I872 Venous insufficiency (chronic) (peripheral): Secondary | ICD-10-CM | POA: Diagnosis present

## 2020-04-29 DIAGNOSIS — R7881 Bacteremia: Secondary | ICD-10-CM | POA: Diagnosis present

## 2020-04-29 DIAGNOSIS — R739 Hyperglycemia, unspecified: Secondary | ICD-10-CM | POA: Diagnosis present

## 2020-04-29 DIAGNOSIS — Z9181 History of falling: Secondary | ICD-10-CM

## 2020-04-29 DIAGNOSIS — Z7982 Long term (current) use of aspirin: Secondary | ICD-10-CM

## 2020-04-29 DIAGNOSIS — K219 Gastro-esophageal reflux disease without esophagitis: Secondary | ICD-10-CM | POA: Diagnosis present

## 2020-04-29 DIAGNOSIS — I110 Hypertensive heart disease with heart failure: Secondary | ICD-10-CM | POA: Diagnosis present

## 2020-04-29 DIAGNOSIS — B370 Candidal stomatitis: Secondary | ICD-10-CM | POA: Diagnosis present

## 2020-04-29 DIAGNOSIS — G9341 Metabolic encephalopathy: Secondary | ICD-10-CM | POA: Diagnosis present

## 2020-04-29 DIAGNOSIS — Z7189 Other specified counseling: Secondary | ICD-10-CM

## 2020-04-29 DIAGNOSIS — F419 Anxiety disorder, unspecified: Secondary | ICD-10-CM | POA: Diagnosis present

## 2020-04-29 DIAGNOSIS — A419 Sepsis, unspecified organism: Secondary | ICD-10-CM | POA: Diagnosis not present

## 2020-04-29 DIAGNOSIS — Z880 Allergy status to penicillin: Secondary | ICD-10-CM

## 2020-04-29 DIAGNOSIS — R279 Unspecified lack of coordination: Secondary | ICD-10-CM | POA: Diagnosis not present

## 2020-04-29 DIAGNOSIS — I69354 Hemiplegia and hemiparesis following cerebral infarction affecting left non-dominant side: Secondary | ICD-10-CM | POA: Diagnosis not present

## 2020-04-29 DIAGNOSIS — E785 Hyperlipidemia, unspecified: Secondary | ICD-10-CM

## 2020-04-29 DIAGNOSIS — Z853 Personal history of malignant neoplasm of breast: Secondary | ICD-10-CM

## 2020-04-29 DIAGNOSIS — Z90722 Acquired absence of ovaries, bilateral: Secondary | ICD-10-CM

## 2020-04-29 DIAGNOSIS — Z993 Dependence on wheelchair: Secondary | ICD-10-CM

## 2020-04-29 DIAGNOSIS — Z90710 Acquired absence of both cervix and uterus: Secondary | ICD-10-CM

## 2020-04-29 DIAGNOSIS — G8929 Other chronic pain: Secondary | ICD-10-CM | POA: Diagnosis present

## 2020-04-29 DIAGNOSIS — H547 Unspecified visual loss: Secondary | ICD-10-CM | POA: Diagnosis present

## 2020-04-29 DIAGNOSIS — Z79899 Other long term (current) drug therapy: Secondary | ICD-10-CM

## 2020-04-29 DIAGNOSIS — Z885 Allergy status to narcotic agent status: Secondary | ICD-10-CM

## 2020-04-29 DIAGNOSIS — Z8249 Family history of ischemic heart disease and other diseases of the circulatory system: Secondary | ICD-10-CM

## 2020-04-29 DIAGNOSIS — B9689 Other specified bacterial agents as the cause of diseases classified elsewhere: Secondary | ICD-10-CM | POA: Diagnosis present

## 2020-04-29 DIAGNOSIS — I5032 Chronic diastolic (congestive) heart failure: Secondary | ICD-10-CM | POA: Diagnosis present

## 2020-04-29 DIAGNOSIS — Z882 Allergy status to sulfonamides status: Secondary | ICD-10-CM

## 2020-04-29 DIAGNOSIS — Z79891 Long term (current) use of opiate analgesic: Secondary | ICD-10-CM

## 2020-04-29 DIAGNOSIS — Z7401 Bed confinement status: Secondary | ICD-10-CM | POA: Diagnosis not present

## 2020-04-29 DIAGNOSIS — R54 Age-related physical debility: Secondary | ICD-10-CM | POA: Diagnosis present

## 2020-04-29 DIAGNOSIS — Z9079 Acquired absence of other genital organ(s): Secondary | ICD-10-CM | POA: Diagnosis not present

## 2020-04-29 DIAGNOSIS — M797 Fibromyalgia: Secondary | ICD-10-CM | POA: Diagnosis present

## 2020-04-29 DIAGNOSIS — Z66 Do not resuscitate: Secondary | ICD-10-CM | POA: Diagnosis present

## 2020-04-29 DIAGNOSIS — R5381 Other malaise: Secondary | ICD-10-CM | POA: Diagnosis not present

## 2020-04-29 DIAGNOSIS — Z888 Allergy status to other drugs, medicaments and biological substances status: Secondary | ICD-10-CM

## 2020-04-29 DIAGNOSIS — J449 Chronic obstructive pulmonary disease, unspecified: Secondary | ICD-10-CM | POA: Diagnosis present

## 2020-04-29 DIAGNOSIS — Z87891 Personal history of nicotine dependence: Secondary | ICD-10-CM

## 2020-04-29 DIAGNOSIS — Z9011 Acquired absence of right breast and nipple: Secondary | ICD-10-CM

## 2020-04-29 MED ORDER — OXYCODONE HCL 5 MG PO TABS
5.0000 mg | ORAL_TABLET | ORAL | Status: DC | PRN
  Administered 2020-04-29: 5 mg via ORAL

## 2020-04-29 MED ORDER — BIOTENE DRY MOUTH MT LIQD: 15.0000 mL | OROMUCOSAL | Status: DC | PRN

## 2020-04-29 MED ORDER — ACETAMINOPHEN 650 MG RE SUPP: 650.0000 mg | Freq: Four times a day (QID) | RECTAL | Status: DC | PRN

## 2020-04-29 MED ORDER — TRAZODONE HCL 50 MG PO TABS
50.0000 mg | ORAL_TABLET | Freq: Every day | ORAL | Status: DC
  Administered 2020-04-29: 50 mg via ORAL
  Filled 2020-04-29: qty 1

## 2020-04-29 MED ORDER — ASPIRIN EC 81 MG PO TBEC
81.0000 mg | DELAYED_RELEASE_TABLET | Freq: Every day | ORAL | Status: DC
  Administered 2020-04-30: 81 mg via ORAL
  Filled 2020-04-29: qty 1

## 2020-04-29 MED ORDER — LORAZEPAM 1 MG PO TABS: 1.0000 mg | ORAL_TABLET | ORAL | Status: DC | PRN

## 2020-04-29 MED ORDER — BIOTENE DRY MOUTH MT LIQD: 15.0000 mL | Freq: Three times a day (TID) | OROMUCOSAL | Status: DC

## 2020-04-29 MED ORDER — BIOTENE DRY MOUTH MT LIQD
15.0000 mL | Freq: Three times a day (TID) | OROMUCOSAL | Status: DC
  Administered 2020-04-29: 15 mL via TOPICAL

## 2020-04-29 MED ORDER — OXYCODONE HCL 5 MG PO TABS
5.0000 mg | ORAL_TABLET | ORAL | Status: DC | PRN
  Administered 2020-04-30: 5 mg via ORAL
  Filled 2020-04-29 (×2): qty 1

## 2020-04-29 MED ORDER — ROSUVASTATIN CALCIUM 10 MG PO TABS: 5.0000 mg | ORAL_TABLET | Freq: Every evening | ORAL | Status: DC

## 2020-04-29 MED ORDER — LORAZEPAM 2 MG/ML IJ SOLN: 1.0000 mg | INTRAMUSCULAR | Status: DC | PRN

## 2020-04-29 MED ORDER — HALOPERIDOL 0.5 MG PO TABS: 0.5000 mg | ORAL_TABLET | ORAL | Status: DC | PRN

## 2020-04-29 MED ORDER — GLYCOPYRROLATE 1 MG PO TABS: 1.0000 mg | ORAL_TABLET | ORAL | Status: DC | PRN

## 2020-04-29 MED ORDER — LORAZEPAM 2 MG/ML PO CONC: 1.0000 mg | ORAL | Status: DC | PRN

## 2020-04-29 MED ORDER — HALOPERIDOL LACTATE 2 MG/ML PO CONC: 0.5000 mg | ORAL | Status: DC | PRN

## 2020-04-29 MED ORDER — POLYVINYL ALCOHOL 1.4 % OP SOLN
1.0000 [drp] | Freq: Three times a day (TID) | OPHTHALMIC | Status: DC
  Administered 2020-04-29 – 2020-04-30 (×2): 1 [drp] via OPHTHALMIC
  Filled 2020-04-29: qty 15

## 2020-04-29 MED ORDER — DULOXETINE HCL 60 MG PO CPEP
60.0000 mg | ORAL_CAPSULE | Freq: Every day | ORAL | Status: DC
  Administered 2020-04-30: 60 mg via ORAL
  Filled 2020-04-29: qty 1

## 2020-04-29 MED ORDER — ACETAMINOPHEN 325 MG PO TABS
650.0000 mg | ORAL_TABLET | Freq: Four times a day (QID) | ORAL | Status: DC | PRN
  Administered 2020-04-29: 650 mg via ORAL

## 2020-04-29 MED ORDER — ONDANSETRON 4 MG PO TBDP: 4.0000 mg | ORAL_TABLET | Freq: Four times a day (QID) | ORAL | Status: DC | PRN

## 2020-04-29 MED ORDER — SODIUM CHLORIDE 0.9% FLUSH: 10.0000 mL | INTRAVENOUS | Status: DC | PRN

## 2020-04-29 MED ORDER — HALOPERIDOL LACTATE 5 MG/ML IJ SOLN: 0.5000 mg | INTRAMUSCULAR | Status: DC | PRN

## 2020-04-29 MED ORDER — ONDANSETRON HCL 4 MG PO TABS: 4.0000 mg | ORAL_TABLET | Freq: Four times a day (QID) | ORAL | Status: DC | PRN

## 2020-04-29 MED ORDER — ALBUTEROL SULFATE (2.5 MG/3ML) 0.083% IN NEBU: 2.5000 mg | INHALATION_SOLUTION | RESPIRATORY_TRACT | Status: DC | PRN

## 2020-04-29 MED ORDER — POLYVINYL ALCOHOL 1.4 % OP SOLN: 1.0000 [drp] | Freq: Four times a day (QID) | OPHTHALMIC | Status: DC | PRN

## 2020-04-29 MED ORDER — ACETAMINOPHEN 325 MG PO TABS
650.0000 mg | ORAL_TABLET | Freq: Four times a day (QID) | ORAL | Status: DC | PRN
  Administered 2020-04-29 – 2020-04-30 (×2): 650 mg via ORAL
  Filled 2020-04-29 (×3): qty 2

## 2020-04-29 MED ORDER — SODIUM CHLORIDE 0.9% FLUSH
10.0000 mL | Freq: Two times a day (BID) | INTRAVENOUS | Status: DC
  Administered 2020-04-30: 20 mL

## 2020-04-29 MED ORDER — GLYCOPYRROLATE 0.2 MG/ML IJ SOLN: 0.2000 mg | INTRAMUSCULAR | Status: DC | PRN

## 2020-04-29 MED ORDER — PANTOPRAZOLE SODIUM 40 MG PO TBEC
40.0000 mg | DELAYED_RELEASE_TABLET | Freq: Every day | ORAL | Status: DC
  Administered 2020-04-30: 40 mg via ORAL
  Filled 2020-04-29: qty 1

## 2020-04-29 MED ORDER — BISACODYL 10 MG RE SUPP: 10.0000 mg | Freq: Every day | RECTAL | Status: DC | PRN

## 2020-04-29 MED ORDER — POLYETHYLENE GLYCOL 3350 17 G PO PACK: 17.0000 g | PACK | Freq: Every day | ORAL | Status: DC | PRN

## 2020-04-29 MED ORDER — CHLORHEXIDINE GLUCONATE 0.12 % MT SOLN
15.0000 mL | Freq: Two times a day (BID) | OROMUCOSAL | Status: DC
  Administered 2020-04-29: 15 mL via OROMUCOSAL
  Filled 2020-04-29 (×2): qty 15

## 2020-04-29 MED ORDER — KCL IN DEXTROSE-NACL 40-5-0.45 MEQ/L-%-% IV SOLN: INTRAVENOUS | Status: DC

## 2020-04-29 MED ORDER — OXYCODONE HCL 5 MG PO TABS: 5.0000 mg | ORAL_TABLET | ORAL | Status: DC | PRN

## 2020-04-29 MED ORDER — POLYVINYL ALCOHOL 1.4 % OP SOLN: 1.0000 [drp] | Freq: Three times a day (TID) | OPHTHALMIC | Status: DC

## 2020-04-29 MED ORDER — DIPHENHYDRAMINE HCL 50 MG/ML IJ SOLN: 12.5000 mg | INTRAMUSCULAR | Status: DC | PRN

## 2020-04-29 MED ORDER — NYSTATIN 100000 UNIT/GM EX POWD: Freq: Three times a day (TID) | CUTANEOUS | Status: DC | PRN

## 2020-04-29 MED ORDER — ORAL CARE MOUTH RINSE: 15.0000 mL | Freq: Two times a day (BID) | OROMUCOSAL | Status: DC

## 2020-04-29 MED ORDER — ONDANSETRON HCL 4 MG/2ML IJ SOLN: 4.0000 mg | Freq: Four times a day (QID) | INTRAMUSCULAR | Status: DC | PRN

## 2020-04-29 MED ORDER — ONDANSETRON HCL 4 MG/2ML IJ SOLN
4.0000 mg | Freq: Four times a day (QID) | INTRAMUSCULAR | Status: DC | PRN
  Administered 2020-04-30: 4 mg via INTRAVENOUS
  Filled 2020-04-29: qty 2

## 2020-04-29 MED ORDER — OXYBUTYNIN CHLORIDE 5 MG PO TABS
10.0000 mg | ORAL_TABLET | Freq: Every day | ORAL | Status: DC
  Administered 2020-04-29: 10 mg via ORAL
  Filled 2020-04-29: qty 2

## 2020-04-29 NOTE — Care Management Important Message (Signed)
Important Message  Patient Details  Name: Kristie Morris MRN: [id number] Date of Birth: 18-Jan-1925   Medicare Important Message Given:  Yes     Tommy Medal 04/29/2020, 4:24 PM

## 2020-04-29 NOTE — Discharge Summary (Signed)
Physician Discharge Summary  Kristie Morris EXN:[id number] DOB: 06/27/1925 DOA: 04/17/2020  PCP: [name], NP  Admit date: 04/17/2020 Discharge date: 04/29/2020  Admitted From: Home  Disposition:  Hospice  Discharge Condition: Hospice   CODE STATUS: DNR   Brief Hospitalization Summary: Please see all hospital notes, images, labs for full details of the hospitalization. ADMISSION HPI: Kristie Morris is a 86 y.o. female with medical history significant for diastolic CHF, COPD, asthma, hypertension, CVA with residual left hemiparesis. Patient was brought to the ED with reports of confusion that started last night.  History is obtained from patient's granddaughter in law who is at bedside and takes care of patient, a limited history from patient herself.  On time of evaluation patient is awake alert oriented to person and place, she is able to answer simple questions but not give me a lot of detail. Patient also has been generally weak, with poor p.o. intake since yesterday.  Patient's urine was also dark, family was concerned that patient had a UTI.  On questioning patient she denies dysuria.  She has a chronic unchanged cough, she denies difficulty breathing.  No chest pain.  Patient also reports dizziness when standing over the past 2 weeks that is getting worse. At baseline patient ambulates with a wheelchair, does not have memory issues/dementia.  Patient was discharged from [facility] days ago, she was admitted 7/7-7/9, for altered mental status secondary to UTI, she was treated with an antibiotic and discharged on Keflex-for additional 2-1/2 days which patient was compliant with.  ED Course: Temperature 98.5.  80s to 90s, blood pressure systolic 496P to 591M.  WBC 9.3.  Creatinine 1.5 about baseline.  Lactic acid 1.1.  UA with trace leukocytes rare bacteria.  Portable chest x-ray without acute abnormality.  Head CT without acute normality.  In the ED O2 sat dropped to 89% patient  was placed on 2 L nasal cannula.  But on my evaluation patient O2 sats greater than 94% on room air.  1 L bolus given.  Hospitalist to admit for metabolic encephalopathy.  Brief Summary: 86 y.o.femalewith medical history significant fordiastolic CHF, COPD, asthma, hypertension, CVA with residual left hemiparesis admitted on 04/17/20 with altered mentation/disorientation in the setting of presumed UTI and GPC bacteremia altered mentation and disorientation likely secondary to hypercalcemia of malignancy.   A/p 1)Acute Metabolic Encephalopathy--RESOLVED.  Pt is alert and answering questions and more talkative today.  This was likely due to hypercalcemia that has now been successfully treated.   Pt also has AKI and presumed UTI that has been treated.    Hypercalcemia (likely from malignancy)  - TREATED s/p zoledronic acid x 1 dose, plus calcitonin and IV fluids, Calcium down to 9.5.   Unfortunately she likely has an occult malignancy a lymphoproliferative disorder.  I reviewed oncology recommendations with family.  Biopsy is not recommended given advanced age and comorbidities.  Palliative care was recommended and family verbalized understanding and is agreeable.  PTH is low at 14.    Hypernatremia - TREATED with free water.   2)CONS bacteremia--contaminants--- antibiotics discontinued-No fevers, no tachypnea, no tachycardia, no leukocytosis, lactic acid is not elevated---patient does Not meet sepsis criteria  3) Pseudomonas-UTI--- continue IV cefepime started on 04/19/2020 last dose 04/24/2020. TREATED.   4)AKI----IMPROVED.  Acute kidney injury due to dehydration and concomitant meloxicam use.  Had improved with hydration but since not eating and drinking she has developed a recurrence of AKI. IV fluids reordered 7/21.  Some improvement  noted 7/22.  Continue IV fluids as she is not eating or drinking well.  -- creatinine on admission= 1.55,  baseline creatinine = 1.1 (10/27/19)   -Creatinine is  down to 1.1 ---renally adjust medications, avoid nephrotoxic agents / dehydration  / hypotension   5)Poor Venous Access--limiting ability to give IV fluids and IV antibiotics-had ultrasound assisted midline placement to the left arm  6) ambulatory dysfunction--- at baseline patient was able to stand and pivot but she gets around with a wheelchair she does not walk -PT eval appreciated, SNF recommended  -PT/OT evaluations performed. SNF Rehab recommended. SNF appropriate as the patient has received 3 days of hospital care (or the 3 day period has been waived by the patient's insurance company) and is felt to need rehab services to restore this patient to their prior level of function to achieve safe transition back to home care. This patient needs rehab services for at least 5 days per week and skilled nursing services daily to facilitate this transition. Rehab is being requested as the most appropriate d/c option for this patient and is NOT felt to be for custodial care as evidenced by previously able to stand, pivot and help with transfers-- prior to admission (comment on the patient's prior level of function). -She was requiring assist of 1 with ADLs prior to admission  7)Prior history of stroke more than 5 years ago with residual left-sided hemiparesis--- please see #6 above -Continue aspirin and crestor for secondary stroke prophylaxis - 8)Depression--- stable continue Cymbalta, and trazodone  9)Dysphagia--speech and swallow evaluation appreciated, recommends dysphagia 3 (Mech soft);Nectar-thick liquid --Did very very poorly with swallow eval/MBS on 04/23/2020 please see full report  10) hypokalemia/Hypomagnesiemia---repleted  11)Social/Ethics---  GOC--- severe dysphagia with aspiration with most consistencies, essentially wheelchair-bound status----- further conversations with family about realistic expectations and overall prognosis- --requesting palliative care to help with further  conversations about advanced directives and kindly help further delineate goals of care and expectations Pt is DNR and family decided for full comfort care.   Discharge Diagnoses:  Principal Problem:   Acute metabolic encephalopathy Active Problems:   Diastolic dysfunction   Asthma, chronic   COPD (chronic obstructive pulmonary disease) (HCC)   Essential hypertension   Personal history of stroke with current residual effects   Wheelchair bound   Bacteremia due to Gram-positive bacteria   Bacteremia   Hypercalcemia   Confusion   Frailty   Advanced care planning/counseling discussion   Goals of care, counseling/discussion   Lymphadenopathy   Palliative care by specialist   Oral candida   Discharge Instructions:  Allergies as of 04/29/2020      Reactions   Zithromax [azithromycin] Diarrhea   Pt in hosp for 2 weeks    Celebrex [celecoxib] Hives   Ciprofloxacin Hives   Metronidazole Hives   Morphine And Related Rash   Moxifloxacin Hives   Penicillins Hives   Polymyxin B Hives   Sulfa Antibiotics Hives   Vancomycin Hives   Vioxx [rofecoxib] Hives      Allergies  Allergen Reactions  . Zithromax [Azithromycin] Diarrhea    Pt in hosp for 2 weeks   . Celebrex [Celecoxib] Hives  . Ciprofloxacin Hives  . Metronidazole Hives  . Morphine And Related Rash  . Moxifloxacin Hives  . Penicillins Hives  . Polymyxin B Hives  . Sulfa Antibiotics Hives  . Vancomycin Hives  . Vioxx [Rofecoxib] Hives   Allergies as of 04/29/2020      Reactions   Zithromax [azithromycin] Diarrhea  Pt in hosp for 2 weeks    Celebrex [celecoxib] Hives   Ciprofloxacin Hives   Metronidazole Hives   Morphine And Related Rash   Moxifloxacin Hives   Penicillins Hives   Polymyxin B Hives   Sulfa Antibiotics Hives   Vancomycin Hives   Vioxx [rofecoxib] Hives     Procedures/Studies: DG Chest 1 View  Result Date: 04/17/2020 CLINICAL DATA:  Chest pain, altered mental status EXAM: CHEST  1 VIEW  COMPARISON:  02/21/2020 FINDINGS: The generalized lung apices, slightly limiting evaluation. The lungs are symmetrically expanded and are clear. No definite pneumothorax. No pleural effusion. The cardiomediastinal silhouette is unremarkable. Pulmonary vascularity is normal. Surgical clips are noted within the axilla bilaterally. No acute bone abnormality IMPRESSION: No active disease. Electronically Signed   By: Fidela Salisbury MD   On: 04/17/2020 15:11   CT Head Wo Contrast  Result Date: 04/17/2020 CLINICAL DATA:  Delirium EXAM: CT HEAD WITHOUT CONTRAST TECHNIQUE: Contiguous axial images were obtained from the base of the skull through the vertex without intravenous contrast. COMPARISON:  Feb 21, 2020 FINDINGS: Brain: No evidence of acute territorial infarction, hemorrhage, hydrocephalus,extra-axial collection or mass lesion/mass effect. There is dilatation the ventricles and sulci consistent with age-related atrophy. Low-attenuation changes in the deep white matter consistent with small vessel ischemia. Prior lacunar infarct seen within the right basal ganglia. Vascular: No hyperdense vessel or unexpected calcification. Skull: The skull is intact. No fracture or focal lesion identified. Sinuses/Orbits: The visualized paranasal sinuses and mastoid air cells are clear. The orbits and globes intact. Other: None IMPRESSION: No acute intracranial abnormality. Findings consistent with age related atrophy and chronic small vessel ischemia Electronically Signed   By: Prudencio Pair M.D.   On: 04/17/2020 17:00   CT CHEST W CONTRAST  Result Date: 04/24/2020 CLINICAL DATA:  Pathologic lymphadenopathy EXAM: CT CHEST, ABDOMEN, AND PELVIS WITH CONTRAST TECHNIQUE: Multidetector CT imaging of the chest, abdomen and pelvis was performed following the standard protocol during bolus administration of intravenous contrast. CONTRAST:  74mL OMNIPAQUE IOHEXOL 300 MG/ML  SOLN COMPARISON:  CT chest 02/21/2020, CT abdomen pelvis  04/22/2015 FINDINGS: CT CHEST FINDINGS Cardiovascular: Extensive multi-vessel coronary artery calcification again noted. Global cardiac size within normal limits. Small pericardial effusion is again noted without CT evidence of cardiac tamponade. The pericardial effusion demonstrates relatively bland appearing fluid in demonstrates no pericardial enhancement suggesting a transudative effusion. The central pulmonary arteries are enlarged in keeping with changes of pulmonary arterial hypertension. Extensive atherosclerotic calcification is seen within the thoracic aorta without aneurysm. Mediastinum/Nodes: Multiple thyroid nodules are again identified. There is pathologic supraclavicular mediastinal adenopathy identified, though left hilar and subcarinal adenopathy appears to have improved in the interval since prior examination. By example, left supraclavicular lymph node measures 13 mm x 16 mm at axial image # 5/2. Precarinal lymph node measures 19 mm x 22 mm at axial image # 18/2. Additional pathologic adenopathy is noted within the prevascular, aortopulmonary, lymph node groups. Shotty adenopathy is seen within the left axillary lymph node group, though this does not appear pathologically enlarged. Bilateral axillary lymph node dissection has been performed. Right mastectomy has been performed. Lungs/Pleura: Left basilar atelectasis again noted. Focal infiltrate within the lingula noted on prior examination has resolved. 4 mm pulmonary nodule within the right apex is new from prior examination and is nonspecific, but likely infectious or inflammatory given its rapid development since prior examination. No new focal pulmonary nodules or infiltrates are identified. Linear atelectasis within the right middle lobe  noted. No pneumothorax or pleural effusion. The central airways are patent. Musculoskeletal: Subacute distal right clavicle fracture again noted. CT ABDOMEN PELVIS FINDINGS Hepatobiliary: Liver is  unremarkable. Mild extrahepatic biliary ductal dilation is within acceptable limits for age. Cholelithiasis without CT evidence of acute cholecystitis. Pancreas: Unremarkable Spleen: Unremarkable Adrenals/Urinary Tract: The adrenal glands are unremarkable. There is marked, chronic atrophy the left kidney. Compensatory hypertrophy of the right kidney which is unremarkable. The bladder is unremarkable. Stomach/Bowel: Large volume stool within the rectal vault. Severe sigmoid diverticulosis. Stomach and small bowel are unremarkable. No free intraperitoneal gas or fluid. Vascular/Lymphatic: There is pathologic left periaortic and right common iliac lymphadenopathy. Borderline enlarged lymph node is noted within the right inguinal region. By example, 19 mm right common iliac lymph node is seen at axial image # 78/2. Borderline right inguinal node measures 13 mm x 26 mm at axial image # 99/2. There is extensive aortoiliac atherosclerotic calcification present. Particularly prominent calcification noted at the right renal artery ostium. A saccular aneurysm of the infrarenal abdominal aorta is identified measuring 19 mm x 30 mm in axial image # 66 enlarged since prior examination where this measured 19 mm by 24 mm. Reproductive: Uterus absent.  No adnexal masses. Other: Moderate presacral edema is nonspecific, but may represent inflammatory changes related to stercoral proctitis. Musculoskeletal: Degenerative changes are seen within the lumbar spine. No focal lytic or blastic bone lesions are seen. IMPRESSION: Pathologic left supraclavicular, mediastinal, retroperitoneal, and possible right inguinal and right axillary adenopathy. Given the interval improvement in adenopathy involving the left hilum and subcarinal region, a waxing and waning inflammatory process should also be considered, such as sarcoidosis. Lymphoproliferative disorders, such as lymphoma or leukemia, are not excluded. PET CT examination may be helpful to  assess metabolic activity of these abnormal lymph nodes. If hypermetabolic, left supraclavicular and possible right inguinal adenopathy would provide easy targets for ultrasound-guided biopsy for tissue diagnosis. Electronically Signed   By: Fidela Salisbury MD   On: 04/24/2020 19:48   CT ABDOMEN PELVIS W CONTRAST  Result Date: 04/24/2020 CLINICAL DATA:  Pathologic lymphadenopathy EXAM: CT CHEST, ABDOMEN, AND PELVIS WITH CONTRAST TECHNIQUE: Multidetector CT imaging of the chest, abdomen and pelvis was performed following the standard protocol during bolus administration of intravenous contrast. CONTRAST:  41mL OMNIPAQUE IOHEXOL 300 MG/ML  SOLN COMPARISON:  CT chest 02/21/2020, CT abdomen pelvis 04/22/2015 FINDINGS: CT CHEST FINDINGS Cardiovascular: Extensive multi-vessel coronary artery calcification again noted. Global cardiac size within normal limits. Small pericardial effusion is again noted without CT evidence of cardiac tamponade. The pericardial effusion demonstrates relatively bland appearing fluid in demonstrates no pericardial enhancement suggesting a transudative effusion. The central pulmonary arteries are enlarged in keeping with changes of pulmonary arterial hypertension. Extensive atherosclerotic calcification is seen within the thoracic aorta without aneurysm. Mediastinum/Nodes: Multiple thyroid nodules are again identified. There is pathologic supraclavicular mediastinal adenopathy identified, though left hilar and subcarinal adenopathy appears to have improved in the interval since prior examination. By example, left supraclavicular lymph node measures 13 mm x 16 mm at axial image # 5/2. Precarinal lymph node measures 19 mm x 22 mm at axial image # 18/2. Additional pathologic adenopathy is noted within the prevascular, aortopulmonary, lymph node groups. Shotty adenopathy is seen within the left axillary lymph node group, though this does not appear pathologically enlarged. Bilateral axillary  lymph node dissection has been performed. Right mastectomy has been performed. Lungs/Pleura: Left basilar atelectasis again noted. Focal infiltrate within the lingula noted on prior examination has  resolved. 4 mm pulmonary nodule within the right apex is new from prior examination and is nonspecific, but likely infectious or inflammatory given its rapid development since prior examination. No new focal pulmonary nodules or infiltrates are identified. Linear atelectasis within the right middle lobe noted. No pneumothorax or pleural effusion. The central airways are patent. Musculoskeletal: Subacute distal right clavicle fracture again noted. CT ABDOMEN PELVIS FINDINGS Hepatobiliary: Liver is unremarkable. Mild extrahepatic biliary ductal dilation is within acceptable limits for age. Cholelithiasis without CT evidence of acute cholecystitis. Pancreas: Unremarkable Spleen: Unremarkable Adrenals/Urinary Tract: The adrenal glands are unremarkable. There is marked, chronic atrophy the left kidney. Compensatory hypertrophy of the right kidney which is unremarkable. The bladder is unremarkable. Stomach/Bowel: Large volume stool within the rectal vault. Severe sigmoid diverticulosis. Stomach and small bowel are unremarkable. No free intraperitoneal gas or fluid. Vascular/Lymphatic: There is pathologic left periaortic and right common iliac lymphadenopathy. Borderline enlarged lymph node is noted within the right inguinal region. By example, 19 mm right common iliac lymph node is seen at axial image # 78/2. Borderline right inguinal node measures 13 mm x 26 mm at axial image # 99/2. There is extensive aortoiliac atherosclerotic calcification present. Particularly prominent calcification noted at the right renal artery ostium. A saccular aneurysm of the infrarenal abdominal aorta is identified measuring 19 mm x 30 mm in axial image # 66 enlarged since prior examination where this measured 19 mm by 24 mm. Reproductive: Uterus  absent.  No adnexal masses. Other: Moderate presacral edema is nonspecific, but may represent inflammatory changes related to stercoral proctitis. Musculoskeletal: Degenerative changes are seen within the lumbar spine. No focal lytic or blastic bone lesions are seen. IMPRESSION: Pathologic left supraclavicular, mediastinal, retroperitoneal, and possible right inguinal and right axillary adenopathy. Given the interval improvement in adenopathy involving the left hilum and subcarinal region, a waxing and waning inflammatory process should also be considered, such as sarcoidosis. Lymphoproliferative disorders, such as lymphoma or leukemia, are not excluded. PET CT examination may be helpful to assess metabolic activity of these abnormal lymph nodes. If hypermetabolic, left supraclavicular and possible right inguinal adenopathy would provide easy targets for ultrasound-guided biopsy for tissue diagnosis. Electronically Signed   By: Fidela Salisbury MD   On: 04/24/2020 19:48   DG Swallowing Func-Speech Pathology  Result Date: 04/23/2020 Objective Swallowing Evaluation: Type of Study: MBS-Modified Barium Swallow Study  Patient Details Name: AKAYLAH LALLEY MRN: [id number] Date of Birth: 14-Mar-1925 Today's Date: 04/23/2020 Time: SLP Start Time (ACUTE ONLY): 1353 -SLP Stop Time (ACUTE ONLY): 1418 SLP Time Calculation (min) (ACUTE ONLY): 25 min Past Medical History: Past Medical History: Diagnosis Date . Anemia 02/02/2014 . Anxiety  . Arthritis   knee . Asthma  . CAD (coronary artery disease) cardiologist-  dr Bronson Ing  ([facility] in [place])  2003  Cath with nonobstructive CAD . Chest pain 08/14/2017 . Chronic headache  . Chronic lower back pain  . COPD (chronic obstructive pulmonary disease) ([place])  . Diastolic CHF, chronic ([place])  . Duodenal diverticulum  . Edema of left lower extremity  . Fibromyalgia  . Full dentures  . Gait disorder   uses walker . GERD (gastroesophageal reflux disease)  . Headache disorder  01/17/2015 . Heart murmur  . History of breast cancer  . History of Clostridium difficile  . History of colitis   2001 . History of CVA with residual deficit   06/ 2015  residual left hemiparesis . History of esophageal dilatation  . History of falling  last fall 06-25-2015 . History of hiatal hernia  . History of small bowel obstruction   04-25-2015  resolved without surgical intervention . Lesion of bladder  . Low vision, one eye   right eye . Macular degeneration of both eyes   right eye now low vision/  left eye getting injections currently . Malignant hypertension  . OAB (overactive bladder)  . Protein-calorie malnutrition, severe ([place]) 01/10/2015 . SBO (small bowel obstruction) ([place]) 04/22/2015 . Schatzki's ring  . Sciatica of right side 05/29/2015 . Syncope 08/14/2017 . Urge and stress incontinence  . Varicose veins  . Venous insufficiency, peripheral  . Vertigo  Past Surgical History: Past Surgical History: Procedure Laterality Date . [place]  from colon . ABDOMINAL HYSTERECTOMY  1954  w/ bilateral salpingoophorectomy . ANTERIOR CERVICAL DECOMP/DISCECTOMY FUSION  1971  "used bone off of my right hip" . BREAST LUMPECTOMY Left 2006 . CARDIAC CATHETERIZATION  11-29-2001  dr Tressia Miners turner  Non-obstructive CAD/  20% pLAD,  30% pD1, 50% mRCA/  normal LVF . CATARACT EXTRACTION W/ INTRAOCULAR LENS  IMPLANT, BILATERAL  right 2008//  left 2015 . CYSTOSCOPY WITH HYDRODISTENSION AND BIOPSY N/A 07/23/2015  Procedure: CYSTOSCOPY;HYDRODISTENSION;  Surgeon: Irine Seal, MD;  Location: [facility];  Service: Urology;  Laterality: N/A; . DILATION AND CURETTAGE OF UTERUS  1953   miscarriage . [place] . KNEE ARTHROSCOPY Right 2005 . MASTECTOMY, PARTIAL Right 1999 . OVARIAN CYST REMOVAL  1946 . PATELLA FRACTURE SURGERY Left 2008 . TRANSTHORACIC ECHOCARDIOGRAM  01-09-2015  mild LVH,  ef 58-09%, grade I diastolic dysfunction/  mild MR/  trivial pericardial effusion was identified  HPI: Shelbi H. Holben is a 86 y.o. female with medical history significant for diastolic CHF, COPD, asthma, hypertension, CVA with residual left hemiparesis admitted on 04/17/20 with altered mentation/disorientation in the setting of presumed UTI and GPC bacteremia altered mentation and disorientation in the setting of presumed UTI and GPC bacteremia.Pt was placed on D3 and NTL on 7/16; MBS ordered to objectively assess the swallowing function.  No data recorded Assessment / Plan / Recommendation CHL IP CLINICAL IMPRESSIONS 04/23/2020 Clinical Impression Pt presents with severe oropharyngeal dysphagia compounded by altered mental status; swallowing is characterized by poor awareness of bolus, severe anterior spillage, poor bolus cohesion, prolonged AP transit, lingual pumping, premature spillage pooling into the pyriforms, delayed swallowing trigger, penetration of all liquids (thin, NTL & HTL), and trace to mod SILENT aspiration (with thin, NTL & HTL) (this was sensed X2 out of multiple episodes of aspiration). Note decreased pharyngeal squeeze and decreased laryngeal vestibule closure. At this time, Pt is cognitively unable to consistently participate in strategies however she naturally remains in the position of a chin tuck which was effective in decreasing/eliminating aspiration with tsp sips of HTL. No penetration/aspiration was noted with puree or solid textures however solid texture trials were limited secondary to fatigue. Pt is at VERY HIGH risk of aspiration as trace-mod aspiration was noted with all liquid textures however least restrictive diet is D2/fine chop and HTL (administered with tsp); recommend precautions: small bites/sips, Pt should be seated upright, chin tuck (natural positioning) is ok and recommended for swallowing. Also note Pt is at risk for inadequate nutrition and dehydration. Pt is a good candidate for free water protocol (oral care in between meals followed by ice chips or WATER by  teaspoon). Recommend consider repeat MBS after Pt's mentation has improved, subjective upgrade will be challenging secondary to multiple episodes of silent  aspiration. ST will continue to follow acutely SLP Visit Diagnosis Dysphagia, unspecified (R13.10) Attention and concentration deficit following -- Frontal lobe and executive function deficit following -- Impact on safety and function Mild aspiration risk;Moderate aspiration risk   CHL IP TREATMENT RECOMMENDATION 04/23/2020 Treatment Recommendations Therapy as outlined in treatment plan below   Prognosis 04/23/2020 Prognosis for Safe Diet Advancement Fair Barriers to Reach Goals Cognitive deficits;Severity of deficits Barriers/Prognosis Comment -- [facility] IP DIET RECOMMENDATION 04/23/2020 SLP Diet Recommendations Dysphagia 2 (Fine chop) solids;Honey thick liquids Liquid Administration via Spoon Medication Administration Whole meds with puree Compensations Minimize environmental distractions;Slow rate;Small sips/bites;Monitor for anterior loss;Multiple dry swallows after each bite/sip;Clear throat after each swallow;Clear throat intermittently Postural Changes Remain semi-upright after after feeds/meals (Comment);Seated upright at 90 degrees   [facility] IP OTHER RECOMMENDATIONS 04/23/2020 Recommended Consults -- Oral Care Recommendations Oral care BID Other Recommendations Order thickener from pharmacy   [facility] IP FOLLOW UP RECOMMENDATIONS 04/23/2020 Follow up Recommendations 24 hour supervision/assistance;Skilled Nursing facility   [facility] IP FREQUENCY AND DURATION 04/23/2020 Speech Therapy Frequency (ACUTE ONLY) min 1 x/week Treatment Duration 1 week      [facility] IP ORAL PHASE 04/23/2020 Oral Phase Impaired Oral - Pudding Teaspoon -- Oral - Pudding Cup -- Oral - Honey Teaspoon Left anterior bolus loss;Right anterior bolus loss;Impaired mastication;Weak lingual manipulation;Lingual pumping;Reduced posterior propulsion;Holding of bolus;Lingual/palatal residue;Piecemeal swallowing;Delayed  oral transit;Decreased bolus cohesion;Premature spillage Oral - Honey Cup Left anterior bolus loss;Right anterior bolus loss;Impaired mastication;Weak lingual manipulation;Lingual pumping;Reduced posterior propulsion;Holding of bolus;Lingual/palatal residue;Piecemeal swallowing;Delayed oral transit;Decreased bolus cohesion;Premature spillage Oral - Nectar Teaspoon Left anterior bolus loss;Right anterior bolus loss;Impaired mastication;Weak lingual manipulation;Lingual pumping;Reduced posterior propulsion;Holding of bolus;Lingual/palatal residue;Piecemeal swallowing;Delayed oral transit;Decreased bolus cohesion;Premature spillage Oral - Nectar Cup Left anterior bolus loss;Right anterior bolus loss;Impaired mastication;Weak lingual manipulation;Lingual pumping;Reduced posterior propulsion;Holding of bolus;Lingual/palatal residue;Piecemeal swallowing;Delayed oral transit;Decreased bolus cohesion;Premature spillage Oral - Nectar Straw Left anterior bolus loss;Right anterior bolus loss;Impaired mastication;Weak lingual manipulation;Lingual pumping;Reduced posterior propulsion;Holding of bolus;Lingual/palatal residue;Piecemeal swallowing;Delayed oral transit;Decreased bolus cohesion;Premature spillage Oral - Thin Teaspoon Left anterior bolus loss;Right anterior bolus loss;Impaired mastication;Weak lingual manipulation;Lingual pumping;Reduced posterior propulsion;Holding of bolus;Lingual/palatal residue;Piecemeal swallowing;Delayed oral transit;Decreased bolus cohesion;Premature spillage Oral - Thin Cup Left anterior bolus loss;Right anterior bolus loss;Impaired mastication;Weak lingual manipulation;Lingual pumping;Reduced posterior propulsion;Holding of bolus;Lingual/palatal residue;Piecemeal swallowing;Delayed oral transit;Decreased bolus cohesion;Premature spillage Oral - Thin Straw -- Oral - Puree Left anterior bolus loss;Right anterior bolus loss;Impaired mastication;Weak lingual manipulation;Lingual pumping;Reduced  posterior propulsion;Holding of bolus;Lingual/palatal residue;Piecemeal swallowing;Delayed oral transit;Decreased bolus cohesion;Premature spillage Oral - Mech Soft -- Oral - Regular Left anterior bolus loss;Right anterior bolus loss;Impaired mastication;Weak lingual manipulation;Lingual pumping;Reduced posterior propulsion;Holding of bolus;Lingual/palatal residue;Piecemeal swallowing;Delayed oral transit;Decreased bolus cohesion;Premature spillage Oral - Multi-Consistency -- Oral - Pill -- Oral Phase - Comment --  CHL IP PHARYNGEAL PHASE 04/23/2020 Pharyngeal Phase Impaired Pharyngeal- Pudding Teaspoon -- Pharyngeal -- Pharyngeal- Pudding Cup -- Pharyngeal -- Pharyngeal- Honey Teaspoon Delayed swallow initiation-vallecula;Delayed swallow initiation-pyriform sinuses;Reduced pharyngeal peristalsis;Reduced airway/laryngeal closure;Penetration/Aspiration during swallow;Penetration/Apiration after swallow;Moderate aspiration;Pharyngeal residue - valleculae;Pharyngeal residue - pyriform;Lateral channel residue Pharyngeal Material enters airway, passes BELOW cords without attempt by patient to eject out (silent aspiration) Pharyngeal- Honey Cup Delayed swallow initiation-vallecula;Delayed swallow initiation-pyriform sinuses;Reduced pharyngeal peristalsis;Reduced airway/laryngeal closure;Penetration/Aspiration during swallow;Penetration/Apiration after swallow;Moderate aspiration;Pharyngeal residue - valleculae;Pharyngeal residue - pyriform;Lateral channel residue Pharyngeal Material enters airway, passes BELOW cords without attempt by patient to eject out (silent aspiration) Pharyngeal- Nectar Teaspoon -- Pharyngeal -- Pharyngeal- Nectar Cup Delayed swallow initiation-vallecula;Delayed swallow initiation-pyriform sinuses;Reduced pharyngeal peristalsis;Reduced airway/laryngeal closure;Penetration/Aspiration during swallow;Penetration/Apiration after swallow;Moderate aspiration;Pharyngeal residue -  valleculae;Pharyngeal  residue - pyriform;Lateral channel residue Pharyngeal Material enters airway, passes BELOW cords without attempt by patient to eject out (silent aspiration) Pharyngeal- Nectar Straw Delayed swallow initiation-vallecula;Delayed swallow initiation-pyriform sinuses;Reduced pharyngeal peristalsis;Reduced airway/laryngeal closure;Penetration/Aspiration during swallow;Penetration/Apiration after swallow;Moderate aspiration;Pharyngeal residue - valleculae;Pharyngeal residue - pyriform;Lateral channel residue Pharyngeal Material enters airway, passes BELOW cords without attempt by patient to eject out (silent aspiration) Pharyngeal- Thin Teaspoon Delayed swallow initiation-vallecula;Delayed swallow initiation-pyriform sinuses;Reduced pharyngeal peristalsis;Reduced airway/laryngeal closure;Penetration/Aspiration during swallow;Penetration/Apiration after swallow;Moderate aspiration;Pharyngeal residue - valleculae;Pharyngeal residue - pyriform;Lateral channel residue Pharyngeal Material enters airway, passes BELOW cords without attempt by patient to eject out (silent aspiration) Pharyngeal- Thin Cup Delayed swallow initiation-vallecula;Delayed swallow initiation-pyriform sinuses;Reduced pharyngeal peristalsis;Reduced airway/laryngeal closure;Penetration/Aspiration during swallow;Penetration/Apiration after swallow;Moderate aspiration;Pharyngeal residue - valleculae;Pharyngeal residue - pyriform;Lateral channel residue Pharyngeal Material enters airway, passes BELOW cords without attempt by patient to eject out (silent aspiration) Pharyngeal- Thin Straw NT Pharyngeal -- Pharyngeal- Puree Delayed swallow initiation-vallecula Pharyngeal -- Pharyngeal- Mechanical Soft -- Pharyngeal -- Pharyngeal- Regular Delayed swallow initiation-vallecula Pharyngeal -- Pharyngeal- Multi-consistency -- Pharyngeal -- Pharyngeal- Pill -- Pharyngeal -- Pharyngeal Comment pronounce cricopharyngeus  No flowsheet data found. Amelia H. Roddie Mc,  CCC-SLP Speech Language Pathologist Wende Bushy 04/23/2020, 3:34 PM              [place] EKG SITE RITE  Result Date: 04/18/2020 If Site Rite image not attached, placement could not be confirmed due to current cardiac rhythm.    Discharge Exam: Vitals:   04/28/20 1433 04/29/20 1407  BP: (!) 122/62 (!) 138/55  Pulse: 72 76  Resp: 17 18  Temp:  98.2 F (36.8 C)  SpO2: 96% 100%   Vitals:   04/27/20 1456 04/27/20 2109 04/28/20 1433 04/29/20 1407  BP: (!) 128/64  (!) 122/62 (!) 138/55  Pulse: 70  72 76  Resp: 16  17 18   Temp: 98.2 F (36.8 C)   98.2 F (36.8 C)  TempSrc: Axillary   Oral  SpO2: 98% 97% 96% 100%  Weight:      Height:       General: Pt is somnolent, awake, not in acute distress Cardiovascular: normal S1/S2 +, no rubs, no gallops Respiratory: CTA bilaterally, no wheezing, no rhonchi Abdominal: Soft, NT, ND, bowel sounds + Extremities:  no cyanosis   The results of significant diagnostics from this hospitalization (including imaging, microbiology, ancillary and laboratory) are listed below for reference.     Microbiology: No results found for this or any previous visit (from the past 240 hour(s)).   Labs: BNP (last 3 results) Recent Labs    02/21/20 1614  BNP 95.0   Basic Metabolic Panel: Recent Labs  Lab 04/23/20 0436 04/24/20 0900 04/25/20 0625 04/26/20 0915 04/27/20 0628  NA  --  146* 144 143 142  K  --  2.8* 3.3* 3.3* 3.5  CL  --  106 106 109 109  CO2  --  29 26 25 25   GLUCOSE  --  113* 112* 102* 99  BUN  --  30* 32* 31* 30*  CREATININE  --  1.46* 1.35* 1.10* 1.04*  CALCIUM  --  12.8* 11.0* 9.8 9.5  MG 2.0 1.9  --  1.6* 2.0   Liver Function Tests: Recent Labs  Lab 04/24/20 0900  AST 32  ALT 27  ALKPHOS 104  BILITOT 0.5  PROT 6.4*  ALBUMIN 3.4*   No results for input(s): LIPASE, AMYLASE in the last 168 hours. No results for input(s): AMMONIA in the last 168 hours. CBC:  No results for input(s): WBC, NEUTROABS, HGB, HCT, MCV,  PLT in the last 168 hours. Cardiac Enzymes: No results for input(s): CKTOTAL, CKMB, CKMBINDEX, TROPONINI in the last 168 hours. BNP: Invalid input(s): POCBNP CBG: No results for input(s): GLUCAP in the last 168 hours. D-Dimer No results for input(s): DDIMER in the last 72 hours. Hgb A1c No results for input(s): HGBA1C in the last 72 hours. Lipid Profile No results for input(s): CHOL, HDL, LDLCALC, TRIG, CHOLHDL, LDLDIRECT in the last 72 hours. Thyroid function studies No results for input(s): TSH, T4TOTAL, T3FREE, THYROIDAB in the last 72 hours.  Invalid input(s): FREET3 Anemia work up No results for input(s): VITAMINB12, FOLATE, FERRITIN, TIBC, IRON, RETICCTPCT in the last 72 hours. Urinalysis    Component Value Date/Time   COLORURINE YELLOW 04/17/2020 1434   APPEARANCEUR CLEAR 04/17/2020 1434   APPEARANCEUR Clear 03/20/2014 1656   LABSPEC 1.013 04/17/2020 1434   LABSPEC 1.015 03/20/2014 1656   PHURINE 6.0 04/17/2020 1434   GLUCOSEU NEGATIVE 04/17/2020 1434   GLUCOSEU Negative 03/20/2014 1656   HGBUR NEGATIVE 04/17/2020 1434   BILIRUBINUR NEGATIVE 04/17/2020 1434   BILIRUBINUR Negative 03/20/2014 1656   KETONESUR NEGATIVE 04/17/2020 1434   PROTEINUR NEGATIVE 04/17/2020 1434   UROBILINOGEN 0.2 04/22/2015 1520   NITRITE NEGATIVE 04/17/2020 1434   LEUKOCYTESUR TRACE (A) 04/17/2020 1434   LEUKOCYTESUR Negative 03/20/2014 1656   Sepsis Labs Invalid input(s): PROCALCITONIN,  WBC,  LACTICIDVEN Microbiology No results found for this or any previous visit (from the past 240 hour(s)).  Time coordinating discharge:   SIGNED:  Irwin Brakeman, MD  [facility] 04/29/2020, 3:12 PM How to contact the [facility] Attending or Consulting provider [place] or covering provider during after hours [place], for this patient?  1. Check the care team in [facility] and look for a) attending/consulting TRH provider listed and b) the [facility] team listed 2. Log into [email or web address] and use [place]'s  universal password to access. If you do not have the password, please contact the hospital operator. 3. Locate the [facility] provider you are looking for under [facility] and page to a number that you can be directly reached. 4. If you still have difficulty reaching the provider, please page the [facility] (Director on Call) for the Hospitalists listed on amion for assistance.

## 2020-04-29 NOTE — H&P (Signed)
History and Physical  [place]ley UXN:[id number] DOB: 1925-01-26 DOA: 04/29/2020  PCP: Kristie Mayo, NP   I have personally briefly reviewed patient's old medical records in [place]  Chief Complaint: GIP admission   HPI: Kristie Morris is a 86 y.o. female Kristie Morris is a 86 y.o. female with medical history significant for diastolic CHF, COPD, asthma, hypertension, CVA with residual left hemiparesis.  Patient was brought to the ED with reports of confusion that started last night.  History is obtained from patient's granddaughter in law who is at bedside and takes care of patient, a limited history from patient herself.  On time of evaluation patient is awake alert oriented to person and place, she is able to answer simple questions but not give me a lot of detail.  Patient also has been generally weak, with poor p.o. intake since yesterday.  Patient's urine was also dark, family was concerned that patient had a UTI.  On questioning patient she denies dysuria.  She has a chronic unchanged cough, she denies difficulty breathing.  No chest pain.  Patient also reports dizziness when standing over the past 2 weeks that is getting worse.  Pt was noted to have hyperglycemia which was corrected but further investigation reveals that patient likely has a lymphoproliferative disorder.  She was seen by oncology.  They did not recommend further evaluation given her advanced age and comorbidities.  After discussions with the family in conjunction with the palliative medicine service family decided to pursue full comfort care measures.  Patient has been admitted to [facility] service for comfort measures only status.   Review of Systems: UTO due to condition.    Past Medical History:  Diagnosis Date  . Anemia 02/02/2014  . Anxiety   . Arthritis    knee  . Asthma   . CAD (coronary artery disease) cardiologist-  dr Bronson Ing  ( cardio in [place])   2003  Cath with  nonobstructive CAD  . Chest pain 08/14/2017  . Chronic headache   . Chronic lower back pain   . COPD (chronic obstructive pulmonary disease) ([place])   . Diastolic CHF, chronic ([place])   . Duodenal diverticulum   . Edema of left lower extremity   . Fibromyalgia   . Full dentures   . Gait disorder    uses walker  . GERD (gastroesophageal reflux disease)   . Headache disorder 01/17/2015  . Heart murmur   . History of breast cancer   . History of Clostridium difficile   . History of colitis    2001  . History of CVA with residual deficit    06/ 2015  residual left hemiparesis  . History of esophageal dilatation   . History of falling    last fall 06-25-2015  . History of hiatal hernia   . History of small bowel obstruction    04-25-2015  resolved without surgical intervention  . Lesion of bladder   . Low vision, one eye    right eye  . Macular degeneration of both eyes    right eye now low vision/  left eye getting injections currently  . Malignant hypertension   . OAB (overactive bladder)   . Protein-calorie malnutrition, severe ([place]) 01/10/2015  . SBO (small bowel obstruction) ([place]) 04/22/2015  . Schatzki's ring   . Sciatica of right side 05/29/2015  . Syncope 08/14/2017  . Urge and stress incontinence   . Varicose veins   .  Venous insufficiency, peripheral   . Vertigo     Past Surgical History:  Procedure Laterality Date  . [place]   from colon  . ABDOMINAL HYSTERECTOMY  1954   w/ bilateral salpingoophorectomy  . ANTERIOR CERVICAL DECOMP/DISCECTOMY FUSION  1971   "used bone off of my right hip"  . BREAST LUMPECTOMY Left 2006  . CARDIAC CATHETERIZATION  11-29-2001  dr Tressia Miners turner   Non-obstructive CAD/  20% pLAD,  30% pD1, 50% mRCA/  normal LVF  . CATARACT EXTRACTION W/ INTRAOCULAR LENS  IMPLANT, BILATERAL  right 2008//  left 2015  . CYSTOSCOPY WITH HYDRODISTENSION AND BIOPSY N/A 07/23/2015   Procedure: CYSTOSCOPY;HYDRODISTENSION;  Surgeon:  Irine Seal, MD;  Location: [facility];  Service: Urology;  Laterality: N/A;  . DILATION AND CURETTAGE OF UTERUS  1953    miscarriage  . [place]  . KNEE ARTHROSCOPY Right 2005  . MASTECTOMY, PARTIAL Right 1999  . OVARIAN CYST REMOVAL  1946  . PATELLA FRACTURE SURGERY Left 2008  . TRANSTHORACIC ECHOCARDIOGRAM  01-09-2015   mild LVH,  ef 16-38%, grade I diastolic dysfunction/  mild MR/  trivial pericardial effusion was identified     reports that she quit smoking about 32 years ago. Her smoking use included cigarettes. She has a 7.50 pack-year smoking history. She has never used smokeless tobacco. She reports current alcohol use of about 1.0 standard drink of alcohol per week. She reports that she does not use drugs.  Allergies  Allergen Reactions  . Zithromax [Azithromycin] Diarrhea    Pt in hosp for 2 weeks   . Celebrex [Celecoxib] Hives  . Ciprofloxacin Hives  . Metronidazole Hives  . Morphine And Related Rash  . Moxifloxacin Hives  . Penicillins Hives  . Polymyxin B Hives  . Sulfa Antibiotics Hives  . Vancomycin Hives  . Vioxx [Rofecoxib] Hives    Family History  Problem Relation Age of Onset  . Heart attack Mother   . CAD Mother   . Polycythemia Mother   . Heart attack Sister   . Cancer - Ovarian Sister   . Cancer - Colon Father   . Macular degeneration Sister   . Thyroid disease Daughter   . Rheumatic fever Daughter      Prior to Admission medications   Medication Sig Start Date End Date Taking? Authorizing Provider  acetaminophen (TYLENOL) 500 MG tablet Take 1,000 mg daily as needed by mouth for mild pain or moderate pain.    [provider]  albuterol (VENTOLIN HFA) 108 (90 Base) MCG/ACT inhaler Inhale 2 puffs into the lungs every 6 (six) hours as needed for wheezing or shortness of breath. 01/16/20   Kristie Mayo, NP  amLODipine (NORVASC) 10 MG tablet Take 10 mg by mouth in the morning.  09/18/19   [provider]  aspirin EC 81 MG tablet Take 81 mg by mouth in the morning.     [provider]  Bacillus Coagulans-Inulin (PROBIOTIC FORMULA PO) Take 1 capsule by mouth in the morning and at bedtime.    [provider]  clotrimazole-betamethasone (LOTRISONE) cream Apply 1 application topically 2 (two) times daily. Patient taking differently: Apply 1 application topically daily as needed (under breast/inner thigh areas).  03/18/20   Kristie Mayo, NP  DULoxetine (CYMBALTA) 30 MG capsule Take 30 mg by mouth in the morning.  09/18/19   [provider]  DULoxetine (CYMBALTA) 60 MG capsule Take 1 capsule (60  mg total) by mouth daily. Patient taking differently: Take 60 mg by mouth in the morning.  01/22/20   Kristie Mayo, NP  gabapentin (NEURONTIN) 300 MG capsule Take 300 mg by mouth 3 (three) times daily. 03/02/20   [provider]  HYDROcodone-acetaminophen (NORCO/VICODIN) 5-325 MG tablet Take 1 tablet by mouth every 4 (four) hours as needed. Patient not taking: Reported on 04/17/2020 02/21/20   Dorie Rank, MD  meloxicam (MOBIC) 7.5 MG tablet Take 1 tablet (7.5 mg total) by mouth 2 (two) times daily. 02/14/20   Kristie Mayo, NP  nitroGLYCERIN (NITROSTAT) 0.4 MG SL tablet Place 0.4 mg every 5 (five) minutes as needed under the tongue.  09/10/15   [provider]  oxybutynin (DITROPAN) 5 MG tablet Take 1 tablet (5 mg total) by mouth 2 (two) times daily. Patient taking differently: Take 10 mg by mouth at bedtime.  02/14/20   Kristie Mayo, NP  pantoprazole (PROTONIX) 40 MG tablet Take 1 tablet (40 mg total) by mouth daily. Patient taking differently: Take 40 mg by mouth daily in the afternoon.  12/11/19   Kristie Mayo, NP  polyethylene glycol powder (GLYCOLAX/MIRALAX) powder Take 17 g daily as needed by mouth for mild constipation.    [provider]  rosuvastatin (CRESTOR) 5 MG tablet TAKE 1 TABLET BY MOUTH ONCE DAILY. Patient taking differently: Take 5 mg  by mouth every evening.  02/05/20   Kristie Mayo, NP  traZODone (DESYREL) 50 MG tablet Take 50 mg by mouth at bedtime. Patient not taking: Reported on 04/17/2020 09/18/19   [provider]    Physical Exam: There were no vitals filed for this visit.  Constitutional: NAD, calm, comfortable There were no vitals filed for this visit. Eyes: PERRL, lids and conjunctivae normal ENMT: Mucous membranes are moist. Abdomen: no tenderness, no masses palpated. No hepatosplenomegaly. Bowel sounds positive.  Skin: no rashes, lesions, ulcers. No induration  CBC: No results for input(s): WBC, NEUTROABS, HGB, HCT, MCV, PLT in the last 168 hours. Basic Metabolic Panel: Recent Labs  Lab 04/23/20 0436 04/24/20 0900 04/25/20 0625 04/26/20 0915 04/27/20 0628  NA  --  146* 144 143 142  K  --  2.8* 3.3* 3.3* 3.5  CL  --  106 106 109 109  CO2  --  29 26 25 25   GLUCOSE  --  113* 112* 102* 99  BUN  --  30* 32* 31* 30*  CREATININE  --  1.46* 1.35* 1.10* 1.04*  CALCIUM  --  12.8* 11.0* 9.8 9.5  MG 2.0 1.9  --  1.6* 2.0   GFR: Estimated Creatinine Clearance: 32.9 mL/min (A) (by C-G formula based on SCr of 1.04 mg/dL (H)). Liver Function Tests: Recent Labs  Lab 04/24/20 0900  AST 32  ALT 27  ALKPHOS 104  BILITOT 0.5  PROT 6.4*  ALBUMIN 3.4*   No results for input(s): LIPASE, AMYLASE in the last 168 hours. No results for input(s): AMMONIA in the last 168 hours. Coagulation Profile: No results for input(s): INR, PROTIME in the last 168 hours. Cardiac Enzymes: No results for input(s): CKTOTAL, CKMB, CKMBINDEX, TROPONINI in the last 168 hours. BNP (last 3 results) No results for input(s): PROBNP in the last 8760 hours. HbA1C: No results for input(s): HGBA1C in the last 72 hours. CBG: No results for input(s): GLUCAP in the last 168 hours. Lipid Profile: No results for input(s): CHOL, HDL, LDLCALC, TRIG, CHOLHDL, LDLDIRECT in the last 72 hours. Thyroid Function Tests:  No results  for input(s): TSH, T4TOTAL, FREET4, T3FREE, THYROIDAB in the last 72 hours. Anemia Panel: No results for input(s): VITAMINB12, FOLATE, FERRITIN, TIBC, IRON, RETICCTPCT in the last 72 hours. Urine analysis:    Component Value Date/Time   COLORURINE YELLOW 04/17/2020 1434   APPEARANCEUR CLEAR 04/17/2020 1434   APPEARANCEUR Clear 03/20/2014 1656   LABSPEC 1.013 04/17/2020 1434   LABSPEC 1.015 03/20/2014 1656   PHURINE 6.0 04/17/2020 1434   GLUCOSEU NEGATIVE 04/17/2020 1434   GLUCOSEU Negative 03/20/2014 1656   HGBUR NEGATIVE 04/17/2020 1434   BILIRUBINUR NEGATIVE 04/17/2020 1434   BILIRUBINUR Negative 03/20/2014 1656   KETONESUR NEGATIVE 04/17/2020 1434   PROTEINUR NEGATIVE 04/17/2020 1434   UROBILINOGEN 0.2 04/22/2015 1520   NITRITE NEGATIVE 04/17/2020 1434   LEUKOCYTESUR TRACE (A) 04/17/2020 1434   LEUKOCYTESUR Negative 03/20/2014 1656    Radiological Exams on Admission: No results found.  Assessment/Plan Principal Problem:   Comfort measures only status   Pt is admitted to GIP for comfort measures. Please see comfort orders.   DVT prophylaxis: n/a  Code Status: DNR  Family Communication: telephone update  Disposition Plan:   Consults called:   Admission status: GIP   Sayra Frisby MD [facility] How to contact the [facility] Attending or Consulting provider [place] or covering provider during after hours [place], for this patient?  1. Check the care team in [facility] and look for a) attending/consulting TRH provider listed and b) the [facility] team listed 2. Log into [email or web address] and use [place]'s universal password to access. If you do not have the password, please contact the hospital operator. 3. Locate the [facility] provider you are looking for under [facility] and page to a number that you can be directly reached. 4. If you still have difficulty reaching the provider, please page the [facility] (Director on Call) for the Hospitalists listed on amion for assistance.   If  7PM-7AM, please contact night-coverage [email or web address] Password [facility]  04/29/2020, 4:35 PM

## 2020-04-29 NOTE — Progress Notes (Addendum)
04/29/2020 1:02 PM   Pt remains on full comfort care.  She appears comfortable, she is verbalizing her needs.  She is eating and drinking some with assistance. Family at bedside.  Discussed with palliative care and family would like to pursue the hospice facility in [place].  I asked the social worker to assist with this.  Continue full comfort care measures and liberalize visitations.    Time spent: 20 mins  Murvin Natal MD  How to contact the [facility] Attending or Consulting provider [place] or covering provider during after hours [place], for this patient?  1. Check the care team in [facility] and look for a) attending/consulting TRH provider listed and b) the [facility] team listed 2. Log into [email or web address] and use [place]'s universal password to access. If you do not have the password, please contact the hospital operator. 3. Locate the [facility] provider you are looking for under [facility] and page to a number that you can be directly reached. 4. If you still have difficulty reaching the provider, please page the [facility] (Director on Call) for the Hospitalists listed on amion for assistance.

## 2020-04-29 NOTE — TOC Progression Note (Signed)
Transition of Care [facility]) - Progression Note    Patient Details  Name: Kristie Morris MRN: [id number] Date of Birth: Mar 15, 1925  Transition of Care [facility]) CM/SW Contact  Ihor Gully, LCSW Phone Number: 04/29/2020, 11:28 AM  Clinical Narrative:    Per palliative care NP request after meeting with family, referral made to [facility].    Expected Discharge Plan: Skilled Nursing Facility Barriers to Discharge: Insurance Authorization  Expected Discharge Plan and Services Expected Discharge Plan: [place]       Living arrangements for the past 2 months: Single Family Home Expected Discharge Date: 04/23/20                                     Social Determinants of Health (SDOH) Interventions    Readmission Risk Interventions No flowsheet data found.

## 2020-04-29 NOTE — Progress Notes (Signed)
Daily Progress Note   Patient Name: Kristie Morris       Date: 04/29/2020 DOB: Feb 27, 1925  Age: 86 y.o. MRN#: [id number] Attending Physician: Murlean Iba, MD Primary Care Physician: [name], NP Admit Date: 04/17/2020  Reason for Consultation/Follow-up: Establishing goals of care  Subjective: Evaluated patient- she wakes to my voice. Able to tell me her name, but not her birthday. Not oriented to time. First tells me she is in the hospital, then asks me where "the baby" is. She is referring to her cat. She then tells me she will have to go somewhere to see a doctor today. She fell back asleep quickly during our conversation. She continues to eat and drink minimally- noting coughing after eating and drinking. Family made decision to transition to full comfort measures over the weekend.  I spoke with family this morning regarding disposition. They would like to request a hospice bed at the [facility] in [place], [place].   Review of Systems  Unable to perform ROS: Mental acuity    Length of Stay: 11  Current Medications: Scheduled Meds:  . amLODipine  5 mg Oral Daily  . aspirin EC  81 mg Oral Daily  . chlorhexidine  15 mL Mouth Rinse BID  . DULoxetine  60 mg Oral Daily  . magic mouthwash  10 mL Oral TID  . mouth rinse  15 mL Mouth Rinse q12n4p  . oxybutynin  10 mg Oral QHS  . pantoprazole  40 mg Oral Q1500  . rosuvastatin  5 mg Oral QPM  . sodium chloride flush  10-40 mL Intracatheter Q12H  . traZODone  50 mg Oral QHS    Continuous Infusions: . dextrose 5 % and 0.45 % NaCl with KCl 40 mEq/L 50 mL/hr at 04/27/20 2218    PRN Meds: acetaminophen **OR** acetaminophen, albuterol, ondansetron **OR** ondansetron (ZOFRAN) IV, polyethylene glycol, sodium chloride  flush  Physical Exam Vitals and nursing note reviewed.  Constitutional:      Comments: Lethargic  HENT:     Mouth/Throat:     Mouth: Mucous membranes are dry.     Comments: White patches are clearing Pulmonary:     Comments: Cough at times Neurological:     Comments: Oriented to person  Vital Signs: BP (!) 122/62 (BP Location: Right Leg)   Pulse 72   Temp 98.2 F (36.8 C) (Axillary)   Resp 17   Ht 5\' 4"  (1.626 m)   Wt 72.3 kg   SpO2 96%   BMI 27.36 kg/m  SpO2: SpO2: 96 % O2 Device: O2 Device: Nasal Cannula O2 Flow Rate: O2 Flow Rate (L/min): 2.5 L/min  Intake/output summary:   Intake/Output Summary (Last 24 hours) at 04/29/2020 1045 Last data filed at 04/29/2020 0900 Gross per 24 hour  Intake 1546.67 ml  Output 500 ml  Net 1046.67 ml   LBM: Last BM Date: 04/28/20 Baseline Weight: Weight: 83 kg Most recent weight: Weight: 72.3 kg       Palliative Assessment/Data: PPS: 20%   Flowsheet Rows     Most Recent Value  Intake Tab  Referral Department Hospitalist  Unit at Time of Referral Med/Surg Unit  Date Notified 04/18/20  Palliative Care Type New Palliative care  Reason for referral Clarify Goals of Care  Date of Admission 04/17/20  # of days IP prior to Palliative referral 1  Clinical Assessment  Psychosocial & Spiritual Assessment  Palliative Care Outcomes      Patient Active Problem List   Diagnosis Date Noted  . Frailty   . Advanced care planning/counseling discussion   . Goals of care, counseling/discussion   . Lymphadenopathy   . Palliative care by specialist   . Oral candida   . Hypercalcemia 04/24/2020  . Confusion 04/24/2020  . Bacteremia due to Gram-positive bacteria 04/18/2020  . Bacteremia 04/18/2020  . Acute metabolic encephalopathy 71/69/6789  . Fall 02/22/2020  . Primary osteoarthritis 01/17/2020  . Post-menopausal 10/24/2019  . Wheelchair bound 10/24/2019  . Overactive bladder 10/17/2019  . Sciatica of right side  05/29/2015  . Essential hypertension 04/27/2015  . Personal history of stroke with current residual effects 04/27/2015  . Abnormality of gait 01/08/2015  . Recurrent falls 01/08/2015  . Pre-syncope 01/08/2015  . Left-sided weakness 04/02/2014  . Diastolic dysfunction 38/07/1750  . Asthma, chronic 02/02/2014  . COPD (chronic obstructive pulmonary disease) ([place]) 02/02/2014  . Hyperlipidemia 02/02/2014    Palliative Care Assessment & Plan   Patient Profile: 86 y.o. female  with past medical history of CHF, COPD, CVA with residual L hemiparesis, breast cancer s/p mastectomy, recent hospitalization in [place] for UTI admitted on 04/17/2020 with altered mental status. Workup reveals bacteremia presumed to be related to UTI. Further workup shows hypercalcemia, diffuse lymphadenopathy concerning for malignancy. Mental status not clearing. Dysphagia, poor po intake. Palliative medicine consulted for goals of care.    Assessment/Recommendations/Plan  UTI/bacteremia- likely to recur quickly when discharged- plan is for comfort measures only Hypercalcemia/lymphadenopathy- likely related to lymphoproliferative process- although hypercalcemia has temporarily decreased with zometa, calcitonin, and IV fluids- like to quickly reoccur when patient discharges, no plans for treatment or further diagnostics related to possible malignancy- plan for full comfort measures Symptom management meds as ordered D/C IV fluids on discharge [facility] referral for request of bed at [place] and Additional Recommendations: Limitations on Scope of Treatment: Full Comfort Care  Code Status: DNR  Prognosis:  < 2 weeks  Discharge Planning: [place] was discussed with patient's HCPOAYsidro Evert.  Thank you for allowing the Palliative Medicine Team to assist in the care of this patient.   Time In: 1000 Time out: 1050 Total Time 50 mins Prolonged Time Billed yes  Greater than 50%  of this time was spent counseling and coordinating care related to the above assessment and plan.  Mariana Kaufman, AGNP-C Palliative Medicine   Please contact Palliative Medicine Team phone at [phone] for questions and concerns.

## 2020-04-30 MED ORDER — STARCH (THICKENING) PO POWD
ORAL | Status: DC | PRN
  Filled 2020-04-30: qty 227

## 2020-04-30 MED ORDER — AMLODIPINE BESYLATE 10 MG PO TABS: 10.0000 mg | ORAL_TABLET | Freq: Every morning | ORAL | Status: AC

## 2020-04-30 MED ORDER — ASPIRIN EC 81 MG PO TBEC: 81.0000 mg | DELAYED_RELEASE_TABLET | Freq: Every morning | ORAL | 11 refills | Status: AC

## 2020-04-30 MED ORDER — DULOXETINE HCL 60 MG PO CPEP: 60.0000 mg | ORAL_CAPSULE | Freq: Every day | ORAL | 3 refills | Status: AC

## 2020-04-30 MED ORDER — OXYBUTYNIN CHLORIDE 5 MG PO TABS: 10.0000 mg | ORAL_TABLET | Freq: Every day | ORAL | Status: AC

## 2020-04-30 MED ORDER — ROSUVASTATIN CALCIUM 5 MG PO TABS: 5.0000 mg | ORAL_TABLET | Freq: Every evening | ORAL | Status: AC

## 2020-04-30 NOTE — TOC Transition Note (Signed)
Transition of Care [facility]) - CM/SW Discharge Note   Patient Details  Name: Kristie Morris MRN: [id number] Date of Birth: November 10, 1924  Transition of Care [facility]) CM/SW Contact:  Ihor Gully, LCSW Phone Number: 04/30/2020, 4:23 PM   Clinical Narrative:    [facility]. Transport arranged.    Final next level of care: [place] Barriers to Discharge: No Barriers Identified   Patient Goals and CMS Choice        Discharge Placement              Patient chooses bed at: Other - please specify in the comment section below: [facility]) Patient to be transferred to facility by: [facility] Name of family member notified: Ysidro Evert Patient and family notified of of transfer: 04/30/20  Discharge Plan and Services                                     Social Determinants of Health (SDOH) Interventions     Readmission Risk Interventions No flowsheet data found.

## 2020-04-30 NOTE — Discharge Summary (Addendum)
Physician Discharge Summary  Kristie Morris:[id number] DOB: May 11, 1925 DOA: 04/29/2020  PCP: [name], NP  Admit date: 04/29/2020 Discharge date: 04/30/2020  Disposition: [facility]   Recommendations for Outpatient 1. SYMPTOM MANAGEMENT PER HOSPICE PROTOCOL   Discharge Condition: HOSPICE   CODE STATUS: DNR    Brief Hospitalization Summary: Please see all hospital notes, images, labs for full details of the hospitalization. HPI: Kristie Morris is a 86 y.o. female Kristie Morris, Kristie Morris, Kristie Morris, Kristie Morris, Kristie Morris.  Patient was brought to the ED with reports of confusion that started last night.History is obtained from patient's granddaughter in law who is at bedside and takes care of patient, a limited history from patient herself. On time of evaluation patient is awake alert oriented to person and place, she is able to answer simple questions but not give me a lot of detail.  Patient also has been generally weak, with poor p.o. intake since yesterday. Patient's urine was also dark, family was concerned that patient had a UTI. On questioning patient she denies dysuria. She has a chronic unchanged cough, she denies difficulty breathing. No chest pain. Patient also reports dizziness when standing over the past 2 weeks that is getting worse.  Pt was noted to have hypercalcemia which was corrected but further investigation reveals that patient likely has a lymphoproliferative disorder.  She was seen by oncologist Dr. Delton Coombes.  He did not recommend further evaluation and work up for this given her advanced age and comorbidities.  After discussions with the family in conjunction with the palliative medicine service family decided to pursue full comfort care measures.  Patient has [facility] for comfort measures only status.  Discharge Diagnoses:  Principal  Problem:   Comfort measures only status   Acute metabolic encephalopathy   DNR    Diastolic dysfunction   Kristie Morris, chronic   Kristie Morris (chronic obstructive pulmonary disease)    Essential Kristie Morris   Personal history of stroke with current residual effects   Wheelchair bound   Bacteremia due to Gram-positive bacteria   Bacteremia   Hypercalcemia   Confusion   Frailty   Advanced care planning/counseling discussion   Goals of care, counseling/discussion   Lymphadenopathy   Palliative care by specialist    Oral candida  Discharge Instructions:  Allergies as of 04/30/2020      Reactions   Zithromax [azithromycin] Diarrhea   Pt in hosp for 2 weeks    Celebrex [celecoxib] Hives   Ciprofloxacin Hives   Metronidazole Hives   Morphine And Related Rash   Moxifloxacin Hives   Penicillins Hives   Polymyxin B Hives   Sulfa Antibiotics Hives   Vancomycin Hives   Vioxx [rofecoxib] Hives      Medication List    STOP taking these medications   acetaminophen 500 MG tablet Commonly known as: TYLENOL   albuterol 108 (90 Base) MCG/ACT inhaler Commonly known as: VENTOLIN HFA   clotrimazole-betamethasone cream Commonly known as: Lotrisone   gabapentin 300 MG capsule Commonly known as: NEURONTIN   HYDROcodone-acetaminophen 5-325 MG tablet Commonly known as: NORCO/VICODIN   meloxicam 7.5 MG tablet Commonly known as: MOBIC   nitroGLYCERIN 0.4 MG SL tablet Commonly known as: NITROSTAT   pantoprazole 40 MG tablet Commonly known as: PROTONIX   polyethylene glycol powder 17 GM/SCOOP powder Commonly known as: GLYCOLAX/MIRALAX   PROBIOTIC FORMULA PO   traZODone 50 MG tablet Commonly known  as: DESYREL     TAKE these medications   amLODipine 10 MG tablet Commonly known as: NORVASC Take 1 tablet (10 mg total) by mouth in the morning.   aspirin EC 81 MG tablet Take 1 tablet (81 mg total) by mouth in the morning.   DULoxetine 60 MG capsule Commonly known as: CYMBALTA Take  1 capsule (60 mg total) by mouth daily. What changed:   when to take this  Another medication with the same name was removed. Continue taking this medication, and follow the directions you see here.   oxybutynin 5 MG tablet Commonly known as: DITROPAN Take 2 tablets (10 mg total) by mouth at bedtime.   rosuvastatin 5 MG tablet Commonly known as: CRESTOR Take 1 tablet (5 mg total) by mouth every evening.       Allergies  Allergen Reactions  . Zithromax [Azithromycin] Diarrhea    Pt in hosp for 2 weeks   . Celebrex [Celecoxib] Hives  . Ciprofloxacin Hives  . Metronidazole Hives  . Morphine And Related Rash  . Moxifloxacin Hives  . Penicillins Hives  . Polymyxin B Hives  . Sulfa Antibiotics Hives  . Vancomycin Hives  . Vioxx [Rofecoxib] Hives   Allergies as of 04/30/2020      Reactions   Zithromax [azithromycin] Diarrhea   Pt in hosp for 2 weeks    Celebrex [celecoxib] Hives   Ciprofloxacin Hives   Metronidazole Hives   Morphine And Related Rash   Moxifloxacin Hives   Penicillins Hives   Polymyxin B Hives   Sulfa Antibiotics Hives   Vancomycin Hives   Vioxx [rofecoxib] Hives      Medication List    STOP taking these medications   acetaminophen 500 MG tablet Commonly known as: TYLENOL   albuterol 108 (90 Base) MCG/ACT inhaler Commonly known as: VENTOLIN HFA   clotrimazole-betamethasone cream Commonly known as: Lotrisone   gabapentin 300 MG capsule Commonly known as: NEURONTIN   HYDROcodone-acetaminophen 5-325 MG tablet Commonly known as: NORCO/VICODIN   meloxicam 7.5 MG tablet Commonly known as: MOBIC   nitroGLYCERIN 0.4 MG SL tablet Commonly known as: NITROSTAT   pantoprazole 40 MG tablet Commonly known as: PROTONIX   polyethylene glycol powder 17 GM/SCOOP powder Commonly known as: GLYCOLAX/MIRALAX   PROBIOTIC FORMULA PO   traZODone 50 MG tablet Commonly known as: DESYREL     TAKE these medications   amLODipine 10 MG tablet Commonly  known as: NORVASC Take 1 tablet (10 mg total) by mouth in the morning.   aspirin EC 81 MG tablet Take 1 tablet (81 mg total) by mouth in the morning.   DULoxetine 60 MG capsule Commonly known as: CYMBALTA Take 1 capsule (60 mg total) by mouth daily. What changed:   when to take this  Another medication with the same name was removed. Continue taking this medication, and follow the directions you see here.   oxybutynin 5 MG tablet Commonly known as: DITROPAN Take 2 tablets (10 mg total) by mouth at bedtime.   rosuvastatin 5 MG tablet Commonly known as: CRESTOR Take 1 tablet (5 mg total) by mouth every evening.       Procedures/Studies: DG Chest 1 View  Result Date: 04/17/2020 CLINICAL DATA:  Chest pain, altered mental status EXAM: CHEST  1 VIEW COMPARISON:  02/21/2020 FINDINGS: The generalized lung apices, slightly limiting evaluation. The lungs are symmetrically expanded and are clear. No definite pneumothorax. No pleural effusion. The cardiomediastinal silhouette is unremarkable. Pulmonary vascularity is normal. Surgical clips  are noted within the axilla bilaterally. No acute bone abnormality IMPRESSION: No active disease. Electronically Signed   By: Fidela Salisbury MD   On: 04/17/2020 15:11   CT Head Wo Contrast  Result Date: 04/17/2020 CLINICAL DATA:  Delirium EXAM: CT HEAD WITHOUT CONTRAST TECHNIQUE: Contiguous axial images were obtained from the base of the skull through the vertex without intravenous contrast. COMPARISON:  Feb 21, 2020 FINDINGS: Brain: No evidence of acute territorial infarction, hemorrhage, hydrocephalus,extra-axial collection or mass lesion/mass effect. There is dilatation the ventricles and sulci consistent with age-related atrophy. Low-attenuation changes in the deep white matter consistent with small vessel ischemia. Prior lacunar infarct seen within the right basal ganglia. Vascular: No hyperdense vessel or unexpected calcification. Skull: The skull is  intact. No fracture or focal lesion identified. Sinuses/Orbits: The visualized paranasal sinuses and mastoid air cells are clear. The orbits and globes intact. Other: None IMPRESSION: No acute intracranial abnormality. Findings consistent with age related atrophy and chronic small vessel ischemia Electronically Signed   By: Prudencio Pair M.D.   On: 04/17/2020 17:00   CT CHEST W CONTRAST  Result Date: 04/24/2020 CLINICAL DATA:  Pathologic lymphadenopathy EXAM: CT CHEST, ABDOMEN, AND PELVIS WITH CONTRAST TECHNIQUE: Multidetector CT imaging of the chest, abdomen and pelvis was performed following the standard protocol during bolus administration of intravenous contrast. CONTRAST:  46mL OMNIPAQUE IOHEXOL 300 MG/ML  SOLN COMPARISON:  CT chest 02/21/2020, CT abdomen pelvis 04/22/2015 FINDINGS: CT CHEST FINDINGS Cardiovascular: Extensive multi-vessel coronary artery calcification again noted. Global cardiac size within normal limits. Small pericardial effusion is again noted without CT evidence of cardiac tamponade. The pericardial effusion demonstrates relatively bland appearing fluid in demonstrates no pericardial enhancement suggesting a transudative effusion. The central pulmonary arteries are enlarged in keeping with changes of pulmonary arterial Kristie Morris. Extensive atherosclerotic calcification is seen within the thoracic aorta without aneurysm. Mediastinum/Nodes: Multiple thyroid nodules are again identified. There is pathologic supraclavicular mediastinal adenopathy identified, though left hilar and subcarinal adenopathy appears to have improved in the interval since prior examination. By example, left supraclavicular lymph node measures 13 mm x 16 mm at axial image # 5/2. Precarinal lymph node measures 19 mm x 22 mm at axial image # 18/2. Additional pathologic adenopathy is noted within the prevascular, aortopulmonary, lymph node groups. Shotty adenopathy is seen within the left axillary lymph node group,  though this does not appear pathologically enlarged. Bilateral axillary lymph node dissection has been performed. Right mastectomy has been performed. Lungs/Pleura: Left basilar atelectasis again noted. Focal infiltrate within the lingula noted on prior examination has resolved. 4 mm pulmonary nodule within the right apex is new from prior examination and is nonspecific, but likely infectious or inflammatory given its rapid development since prior examination. No new focal pulmonary nodules or infiltrates are identified. Linear atelectasis within the right middle lobe noted. No pneumothorax or pleural effusion. The central airways are patent. Musculoskeletal: Subacute distal right clavicle fracture again noted. CT ABDOMEN PELVIS FINDINGS Hepatobiliary: Liver is unremarkable. Mild extrahepatic biliary ductal dilation is within acceptable limits for age. Cholelithiasis without CT evidence of acute cholecystitis. Pancreas: Unremarkable Spleen: Unremarkable Adrenals/Urinary Tract: The adrenal glands are unremarkable. There is marked, chronic atrophy the left kidney. Compensatory hypertrophy of the right kidney which is unremarkable. The bladder is unremarkable. Stomach/Bowel: Large volume stool within the rectal vault. Severe sigmoid diverticulosis. Stomach and small bowel are unremarkable. No free intraperitoneal gas or fluid. Vascular/Lymphatic: There is pathologic left periaortic and right common iliac lymphadenopathy. Borderline enlarged lymph node  is noted within the right inguinal region. By example, 19 mm right common iliac lymph node is seen at axial image # 78/2. Borderline right inguinal node measures 13 mm x 26 mm at axial image # 99/2. There is extensive aortoiliac atherosclerotic calcification present. Particularly prominent calcification noted at the right renal artery ostium. A saccular aneurysm of the infrarenal abdominal aorta is identified measuring 19 mm x 30 mm in axial image # 66 enlarged since  prior examination where this measured 19 mm by 24 mm. Reproductive: Uterus absent.  No adnexal masses. Other: Moderate presacral edema is nonspecific, but may represent inflammatory changes related to stercoral proctitis. Musculoskeletal: Degenerative changes are seen within the lumbar spine. No focal lytic or blastic bone lesions are seen. IMPRESSION: Pathologic left supraclavicular, mediastinal, retroperitoneal, and possible right inguinal and right axillary adenopathy. Given the interval improvement in adenopathy involving the left hilum and subcarinal region, a waxing and waning inflammatory process should also be considered, such as sarcoidosis. Lymphoproliferative disorders, such as lymphoma or leukemia, are not excluded. PET CT examination may be helpful to assess metabolic activity of these abnormal lymph nodes. If hypermetabolic, left supraclavicular and possible right inguinal adenopathy would provide easy targets for ultrasound-guided biopsy for tissue diagnosis. Electronically Signed   By: Fidela Salisbury MD   On: 04/24/2020 19:48   CT ABDOMEN PELVIS W CONTRAST  Result Date: 04/24/2020 CLINICAL DATA:  Pathologic lymphadenopathy EXAM: CT CHEST, ABDOMEN, AND PELVIS WITH CONTRAST TECHNIQUE: Multidetector CT imaging of the chest, abdomen and pelvis was performed following the standard protocol during bolus administration of intravenous contrast. CONTRAST:  19mL OMNIPAQUE IOHEXOL 300 MG/ML  SOLN COMPARISON:  CT chest 02/21/2020, CT abdomen pelvis 04/22/2015 FINDINGS: CT CHEST FINDINGS Cardiovascular: Extensive multi-vessel coronary artery calcification again noted. Global cardiac size within normal limits. Small pericardial effusion is again noted without CT evidence of cardiac tamponade. The pericardial effusion demonstrates relatively bland appearing fluid in demonstrates no pericardial enhancement suggesting a transudative effusion. The central pulmonary arteries are enlarged in keeping with changes of  pulmonary arterial Kristie Morris. Extensive atherosclerotic calcification is seen within the thoracic aorta without aneurysm. Mediastinum/Nodes: Multiple thyroid nodules are again identified. There is pathologic supraclavicular mediastinal adenopathy identified, though left hilar and subcarinal adenopathy appears to have improved in the interval since prior examination. By example, left supraclavicular lymph node measures 13 mm x 16 mm at axial image # 5/2. Precarinal lymph node measures 19 mm x 22 mm at axial image # 18/2. Additional pathologic adenopathy is noted within the prevascular, aortopulmonary, lymph node groups. Shotty adenopathy is seen within the left axillary lymph node group, though this does not appear pathologically enlarged. Bilateral axillary lymph node dissection has been performed. Right mastectomy has been performed. Lungs/Pleura: Left basilar atelectasis again noted. Focal infiltrate within the lingula noted on prior examination has resolved. 4 mm pulmonary nodule within the right apex is new from prior examination and is nonspecific, but likely infectious or inflammatory given its rapid development since prior examination. No new focal pulmonary nodules or infiltrates are identified. Linear atelectasis within the right middle lobe noted. No pneumothorax or pleural effusion. The central airways are patent. Musculoskeletal: Subacute distal right clavicle fracture again noted. CT ABDOMEN PELVIS FINDINGS Hepatobiliary: Liver is unremarkable. Mild extrahepatic biliary ductal dilation is within acceptable limits for age. Cholelithiasis without CT evidence of acute cholecystitis. Pancreas: Unremarkable Spleen: Unremarkable Adrenals/Urinary Tract: The adrenal glands are unremarkable. There is marked, chronic atrophy the left kidney. Compensatory hypertrophy of the right kidney which  is unremarkable. The bladder is unremarkable. Stomach/Bowel: Large volume stool within the rectal vault. Severe sigmoid  diverticulosis. Stomach and small bowel are unremarkable. No free intraperitoneal gas or fluid. Vascular/Lymphatic: There is pathologic left periaortic and right common iliac lymphadenopathy. Borderline enlarged lymph node is noted within the right inguinal region. By example, 19 mm right common iliac lymph node is seen at axial image # 78/2. Borderline right inguinal node measures 13 mm x 26 mm at axial image # 99/2. There is extensive aortoiliac atherosclerotic calcification present. Particularly prominent calcification noted at the right renal artery ostium. A saccular aneurysm of the infrarenal abdominal aorta is identified measuring 19 mm x 30 mm in axial image # 66 enlarged since prior examination where this measured 19 mm by 24 mm. Reproductive: Uterus absent.  No adnexal masses. Other: Moderate presacral edema is nonspecific, but may represent inflammatory changes related to stercoral proctitis. Musculoskeletal: Degenerative changes are seen within the lumbar spine. No focal lytic or blastic bone lesions are seen. IMPRESSION: Pathologic left supraclavicular, mediastinal, retroperitoneal, and possible right inguinal and right axillary adenopathy. Given the interval improvement in adenopathy involving the left hilum and subcarinal region, a waxing and waning inflammatory process should also be considered, such as sarcoidosis. Lymphoproliferative disorders, such as lymphoma or leukemia, are not excluded. PET CT examination may be helpful to assess metabolic activity of these abnormal lymph nodes. If hypermetabolic, left supraclavicular and possible right inguinal adenopathy would provide easy targets for ultrasound-guided biopsy for tissue diagnosis. Electronically Signed   By: Fidela Salisbury MD   On: 04/24/2020 19:48   DG Swallowing Func-Speech Pathology  Result Date: 04/23/2020 Objective Swallowing Evaluation: Type of Study: MBS-Modified Barium Swallow Study  Patient Details Name: SAHIRAH RUDELL MRN:  [id number] Date of Birth: 01/03/1925 Today's Date: 04/23/2020 Time: SLP Start Time (ACUTE ONLY): 1353 -SLP Stop Time (ACUTE ONLY): 1418 SLP Time Calculation (min) (ACUTE ONLY): 25 min Past Medical History: Past Medical History: Diagnosis Date . Anemia 02/02/2014 . Anxiety  . Arthritis   knee . Kristie Morris  . CAD (coronary artery disease) cardiologist-  dr Bronson Ing  ([facility] in [place])  2003  Cath with nonobstructive CAD . Chest pain 08/14/2017 . Chronic headache  . Chronic lower back pain  . Kristie Morris (chronic obstructive pulmonary disease) ([place])  . Diastolic Morris, chronic ([place])  . Duodenal diverticulum  . Edema of left lower extremity  . Fibromyalgia  . Full dentures  . Gait disorder   uses walker . GERD (gastroesophageal reflux disease)  . Headache disorder 01/17/2015 . Heart murmur  . History of breast cancer  . History of Clostridium difficile  . History of colitis   2001 . History of Kristie with residual deficit   06/ 2015  residual left Morris . History of esophageal dilatation  . History of falling   last fall 06-25-2015 . History of hiatal hernia  . History of small bowel obstruction   04-25-2015  resolved without surgical intervention . Lesion of bladder  . Low vision, one eye   right eye . Macular degeneration of both eyes   right eye now low vision/  left eye getting injections currently . Malignant Kristie Morris  . OAB (overactive bladder)  . Protein-calorie malnutrition, severe ([place]) 01/10/2015 . SBO (small bowel obstruction) ([place]) 04/22/2015 . Schatzki's ring  . Sciatica of right side 05/29/2015 . Syncope 08/14/2017 . Urge and stress incontinence  . Varicose veins  . Venous insufficiency, peripheral  . Vertigo  Past Surgical History: Past Surgical History:  Procedure Laterality Date . [place]  from colon . ABDOMINAL HYSTERECTOMY  1954  w/ bilateral salpingoophorectomy . ANTERIOR CERVICAL DECOMP/DISCECTOMY FUSION  1971  "used bone off of my right hip" . BREAST LUMPECTOMY Left 2006 .  CARDIAC CATHETERIZATION  11-29-2001  dr Tressia Miners turner  Non-obstructive CAD/  20% pLAD,  30% pD1, 50% mRCA/  normal LVF . CATARACT EXTRACTION W/ INTRAOCULAR LENS  IMPLANT, BILATERAL  right 2008//  left 2015 . CYSTOSCOPY WITH HYDRODISTENSION AND BIOPSY N/A 07/23/2015  Procedure: CYSTOSCOPY;HYDRODISTENSION;  Surgeon: Irine Seal, MD;  [facility];  Service: Urology;  Laterality: N/A; . DILATION AND CURETTAGE OF UTERUS  1953   miscarriage . [place] . KNEE ARTHROSCOPY Right 2005 . MASTECTOMY, PARTIAL Right 1999 . OVARIAN CYST REMOVAL  1946 . PATELLA FRACTURE SURGERY Left 2008 . TRANSTHORACIC ECHOCARDIOGRAM  01-09-2015  mild LVH,  ef 16-01%, grade I diastolic dysfunction/  mild MR/  trivial pericardial effusion was identified HPI: Kristie Morris is a 86 y.o. female with medical history significant for diastolic Morris, Kristie Morris, Kristie Morris, Kristie Morris, Kristie Morris admitted on 04/17/20 with altered mentation/disorientation in the setting of presumed UTI and GPC bacteremia altered mentation and disorientation in the setting of presumed UTI and GPC bacteremia.Pt was placed on D3 and NTL on 7/16; MBS ordered to objectively assess the swallowing function.  No data recorded Assessment / Plan / Recommendation CHL IP CLINICAL IMPRESSIONS 04/23/2020 Clinical Impression Pt presents with severe oropharyngeal dysphagia compounded by altered mental status; swallowing is characterized by poor awareness of bolus, severe anterior spillage, poor bolus cohesion, prolonged AP transit, lingual pumping, premature spillage pooling into the pyriforms, delayed swallowing trigger, penetration of all liquids (thin, NTL & HTL), and trace to mod SILENT aspiration (with thin, NTL & HTL) (this was sensed X2 out of multiple episodes of aspiration). Note decreased pharyngeal squeeze and decreased laryngeal vestibule closure. At this time, Pt is cognitively unable to consistently participate in  strategies however she naturally remains in the position of a chin tuck which was effective in decreasing/eliminating aspiration with tsp sips of HTL. No penetration/aspiration was noted with puree or solid textures however solid texture trials were limited secondary to fatigue. Pt is at VERY HIGH risk of aspiration as trace-mod aspiration was noted with all liquid textures however least restrictive diet is D2/fine chop and HTL (administered with tsp); recommend precautions: small bites/sips, Pt should be seated upright, chin tuck (natural positioning) is ok and recommended for swallowing. Also note Pt is at risk for inadequate nutrition and dehydration. Pt is a good candidate for free water protocol (oral care in between meals followed by ice chips or WATER by teaspoon). Recommend consider repeat MBS after Pt's mentation has improved, subjective upgrade will be challenging secondary to multiple episodes of silent aspiration. ST will continue to follow acutely SLP Visit Diagnosis Dysphagia, unspecified (R13.10) Attention and concentration deficit following -- Frontal lobe and executive function deficit following -- Impact on safety and function Mild aspiration risk;Moderate aspiration risk   CHL IP TREATMENT RECOMMENDATION 04/23/2020 Treatment Recommendations Therapy as outlined in treatment plan below   Prognosis 04/23/2020 Prognosis for Safe Diet Advancement Fair Barriers to Reach Goals Cognitive deficits;Severity of deficits Barriers/Prognosis Comment -- CHL IP DIET RECOMMENDATION 04/23/2020 SLP Diet Recommendations Dysphagia 2 (Fine chop) solids;Honey thick liquids Liquid Administration via Spoon Medication Administration Whole meds with puree Compensations Minimize environmental distractions;Slow rate;Small sips/bites;Monitor for anterior loss;Multiple dry swallows after each bite/sip;Clear throat after  each swallow;Clear throat intermittently Postural Changes Remain semi-upright after after feeds/meals  (Comment);Seated upright at 90 degrees   CHL IP OTHER RECOMMENDATIONS 04/23/2020 Recommended Consults -- Oral Care Recommendations Oral care BID Other Recommendations Order thickener from pharmacy   CHL IP FOLLOW UP RECOMMENDATIONS 04/23/2020 Follow up Recommendations 24 hour supervision/assistance;Skilled Nursing facility   [facility] IP FREQUENCY AND DURATION 04/23/2020 Speech Therapy Frequency (ACUTE ONLY) min 1 x/week Treatment Duration 1 week      CHL IP ORAL PHASE 04/23/2020 Oral Phase Impaired Oral - Pudding Teaspoon -- Oral - Pudding Cup -- Oral - Honey Teaspoon Left anterior bolus loss;Right anterior bolus loss;Impaired mastication;Weak lingual manipulation;Lingual pumping;Reduced posterior propulsion;Holding of bolus;Lingual/palatal residue;Piecemeal swallowing;Delayed oral transit;Decreased bolus cohesion;Premature spillage Oral - Honey Cup Left anterior bolus loss;Right anterior bolus loss;Impaired mastication;Weak lingual manipulation;Lingual pumping;Reduced posterior propulsion;Holding of bolus;Lingual/palatal residue;Piecemeal swallowing;Delayed oral transit;Decreased bolus cohesion;Premature spillage Oral - Nectar Teaspoon Left anterior bolus loss;Right anterior bolus loss;Impaired mastication;Weak lingual manipulation;Lingual pumping;Reduced posterior propulsion;Holding of bolus;Lingual/palatal residue;Piecemeal swallowing;Delayed oral transit;Decreased bolus cohesion;Premature spillage Oral - Nectar Cup Left anterior bolus loss;Right anterior bolus loss;Impaired mastication;Weak lingual manipulation;Lingual pumping;Reduced posterior propulsion;Holding of bolus;Lingual/palatal residue;Piecemeal swallowing;Delayed oral transit;Decreased bolus cohesion;Premature spillage Oral - Nectar Straw Left anterior bolus loss;Right anterior bolus loss;Impaired mastication;Weak lingual manipulation;Lingual pumping;Reduced posterior propulsion;Holding of bolus;Lingual/palatal residue;Piecemeal swallowing;Delayed oral  transit;Decreased bolus cohesion;Premature spillage Oral - Thin Teaspoon Left anterior bolus loss;Right anterior bolus loss;Impaired mastication;Weak lingual manipulation;Lingual pumping;Reduced posterior propulsion;Holding of bolus;Lingual/palatal residue;Piecemeal swallowing;Delayed oral transit;Decreased bolus cohesion;Premature spillage Oral - Thin Cup Left anterior bolus loss;Right anterior bolus loss;Impaired mastication;Weak lingual manipulation;Lingual pumping;Reduced posterior propulsion;Holding of bolus;Lingual/palatal residue;Piecemeal swallowing;Delayed oral transit;Decreased bolus cohesion;Premature spillage Oral - Thin Straw -- Oral - Puree Left anterior bolus loss;Right anterior bolus loss;Impaired mastication;Weak lingual manipulation;Lingual pumping;Reduced posterior propulsion;Holding of bolus;Lingual/palatal residue;Piecemeal swallowing;Delayed oral transit;Decreased bolus cohesion;Premature spillage Oral - Mech Soft -- Oral - Regular Left anterior bolus loss;Right anterior bolus loss;Impaired mastication;Weak lingual manipulation;Lingual pumping;Reduced posterior propulsion;Holding of bolus;Lingual/palatal residue;Piecemeal swallowing;Delayed oral transit;Decreased bolus cohesion;Premature spillage Oral - Multi-Consistency -- Oral - Pill -- Oral Phase - Comment --  CHL IP PHARYNGEAL PHASE 04/23/2020 Pharyngeal Phase Impaired Pharyngeal- Pudding Teaspoon -- Pharyngeal -- Pharyngeal- Pudding Cup -- Pharyngeal -- Pharyngeal- Honey Teaspoon Delayed swallow initiation-vallecula;Delayed swallow initiation-pyriform sinuses;Reduced pharyngeal peristalsis;Reduced airway/laryngeal closure;Penetration/Aspiration during swallow;Penetration/Apiration after swallow;Moderate aspiration;Pharyngeal residue - valleculae;Pharyngeal residue - pyriform;Lateral channel residue Pharyngeal Material enters airway, passes BELOW cords without attempt by patient to eject out (silent aspiration) Pharyngeal- Honey Cup Delayed  swallow initiation-vallecula;Delayed swallow initiation-pyriform sinuses;Reduced pharyngeal peristalsis;Reduced airway/laryngeal closure;Penetration/Aspiration during swallow;Penetration/Apiration after swallow;Moderate aspiration;Pharyngeal residue - valleculae;Pharyngeal residue - pyriform;Lateral channel residue Pharyngeal Material enters airway, passes BELOW cords without attempt by patient to eject out (silent aspiration) Pharyngeal- Nectar Teaspoon -- Pharyngeal -- Pharyngeal- Nectar Cup Delayed swallow initiation-vallecula;Delayed swallow initiation-pyriform sinuses;Reduced pharyngeal peristalsis;Reduced airway/laryngeal closure;Penetration/Aspiration during swallow;Penetration/Apiration after swallow;Moderate aspiration;Pharyngeal residue - valleculae;Pharyngeal residue - pyriform;Lateral channel residue Pharyngeal Material enters airway, passes BELOW cords without attempt by patient to eject out (silent aspiration) Pharyngeal- Nectar Straw Delayed swallow initiation-vallecula;Delayed swallow initiation-pyriform sinuses;Reduced pharyngeal peristalsis;Reduced airway/laryngeal closure;Penetration/Aspiration during swallow;Penetration/Apiration after swallow;Moderate aspiration;Pharyngeal residue - valleculae;Pharyngeal residue - pyriform;Lateral channel residue Pharyngeal Material enters airway, passes BELOW cords without attempt by patient to eject out (silent aspiration) Pharyngeal- Thin Teaspoon Delayed swallow initiation-vallecula;Delayed swallow initiation-pyriform sinuses;Reduced pharyngeal peristalsis;Reduced airway/laryngeal closure;Penetration/Aspiration during swallow;Penetration/Apiration after swallow;Moderate aspiration;Pharyngeal residue - valleculae;Pharyngeal residue - pyriform;Lateral channel residue Pharyngeal Material enters airway, passes BELOW cords without attempt by patient to eject out (silent aspiration) Pharyngeal- Thin Cup Delayed swallow initiation-vallecula;Delayed swallow  initiation-pyriform sinuses;Reduced pharyngeal peristalsis;Reduced airway/laryngeal closure;Penetration/Aspiration during swallow;Penetration/Apiration after swallow;Moderate aspiration;Pharyngeal residue - valleculae;Pharyngeal residue - pyriform;Lateral channel residue Pharyngeal Material enters airway, passes BELOW cords without attempt by patient to eject out (silent aspiration) Pharyngeal- Thin Straw NT Pharyngeal -- Pharyngeal- Puree Delayed swallow initiation-vallecula Pharyngeal -- Pharyngeal- Mechanical Soft -- Pharyngeal -- Pharyngeal- Regular Delayed swallow initiation-vallecula Pharyngeal -- Pharyngeal- Multi-consistency -- Pharyngeal -- Pharyngeal- Pill -- Pharyngeal -- Pharyngeal Comment pronounce cricopharyngeus  No flowsheet data found. Amelia H. Roddie Mc, CCC-SLP Speech Language Pathologist Wende Bushy 04/23/2020, 3:34 PM              [place] EKG SITE RITE  Result Date: 04/18/2020 If Site Rite image not attached, placement could not be confirmed due to current cardiac rhythm.    Subjective:   Discharge Exam: There were no vitals filed for this visit. There were no vitals filed for this visit.  General: Pt is alert, awake, not in acute distress, appears comfortable. Cardiovascular: normal S1/S2 +, no rubs, no gallops Respiratory: CTA bilaterally, no wheezing, no rhonchi Abdominal: Soft, NT, ND, bowel sounds + Extremities: no cyanosis   The results of significant diagnostics from this hospitalization (including imaging, microbiology, ancillary and laboratory) are listed below for reference.     Microbiology: No results found for this or any previous visit (from the past 240 hour(s)).   Labs: BNP (last 3 results) Recent Labs    02/21/20 1614  BNP 11.9   Basic Metabolic Panel: Recent Labs  Lab 04/24/20 0900 04/25/20 0625 04/26/20 0915 04/27/20 0628  NA 146* 144 143 142  K 2.8* 3.3* 3.3* 3.5  CL 106 106 109 109  CO2 29 26 25 25   GLUCOSE 113* 112* 102* 99   BUN 30* 32* 31* 30*  CREATININE 1.46* 1.35* 1.10* 1.04*  CALCIUM 12.8* 11.0* 9.8 9.5  MG 1.9  --  1.6* 2.0   Liver Function Tests: Recent Labs  Lab 04/24/20 0900  AST 32  ALT 27  ALKPHOS 104  BILITOT 0.5  PROT 6.4*  ALBUMIN 3.4*   No results for input(s): LIPASE, AMYLASE in the last 168 hours. No results for input(s): AMMONIA in the last 168 hours. CBC: No results for input(s): WBC, NEUTROABS, HGB, HCT, MCV, PLT in the last 168 hours. Cardiac Enzymes: No results for input(s): CKTOTAL, CKMB, CKMBINDEX, TROPONINI in the last 168 hours. BNP: Invalid input(s): POCBNP CBG: No results for input(s): GLUCAP in the last 168 hours. D-Dimer No results for input(s): DDIMER in the last 72 hours. Hgb A1c No results for input(s): HGBA1C in the last 72 hours. Lipid Profile No results for input(s): CHOL, HDL, LDLCALC, TRIG, CHOLHDL, LDLDIRECT in the last 72 hours. Thyroid function studies No results for input(s): TSH, T4TOTAL, T3FREE, THYROIDAB in the last 72 hours.  Invalid input(s): FREET3 Anemia work up No results for input(s): VITAMINB12, FOLATE, FERRITIN, TIBC, IRON, RETICCTPCT in the last 72 hours. Urinalysis    Component Value Date/Time   COLORURINE YELLOW 04/17/2020 1434   APPEARANCEUR CLEAR 04/17/2020 1434   APPEARANCEUR Clear 03/20/2014 1656   LABSPEC 1.013 04/17/2020 1434   LABSPEC 1.015 03/20/2014 1656   PHURINE 6.0 04/17/2020 1434   GLUCOSEU NEGATIVE 04/17/2020 1434   GLUCOSEU Negative 03/20/2014 1656   HGBUR NEGATIVE 04/17/2020 1434   BILIRUBINUR NEGATIVE 04/17/2020 1434   BILIRUBINUR Negative 03/20/2014 1656   [place] 04/17/2020 1434   PROTEINUR NEGATIVE 04/17/2020 1434   UROBILINOGEN 0.2 04/22/2015 1520   NITRITE NEGATIVE 04/17/2020 1434   LEUKOCYTESUR TRACE (  A) 04/17/2020 1434   LEUKOCYTESUR Negative 03/20/2014 1656   Sepsis Labs Invalid input(s): PROCALCITONIN,  WBC,  LACTICIDVEN Microbiology No results found for this or any previous visit  (from the past 240 hour(s)).  Time coordinating discharge:   SIGNED:  Irwin Brakeman, MD  [facility] 04/30/2020, 4:05 PM How to contact the [facility] Attending or Consulting provider [place] or covering provider during after hours [place], for this patient?  1. Check the care team in [facility] and look for a) attending/consulting [facility] provider listed and b) the [facility] team listed 2. Log into [email or web address] and use [place]'s universal password to access. If you do not have the password, please contact the hospital operator. 3. Locate the [facility] provider you are looking for under [facility] and page to a number that you can be directly reached. 4. If you still have difficulty reaching the provider, please page the [facility] (Director on Call) for the Hospitalists listed on amion for assistance.

## 2020-04-30 NOTE — Progress Notes (Signed)
Pt discharged via [facility] stretcher to [facility].

## 2020-04-30 NOTE — Plan of Care (Signed)

## 2020-05-05 DIAGNOSIS — R296 Repeated falls: Secondary | ICD-10-CM | POA: Diagnosis not present

## 2020-05-05 DIAGNOSIS — R269 Unspecified abnormalities of gait and mobility: Secondary | ICD-10-CM | POA: Diagnosis not present

## 2020-05-05 DIAGNOSIS — M6289 Other specified disorders of muscle: Secondary | ICD-10-CM | POA: Diagnosis not present

## 2020-05-05 DIAGNOSIS — E538 Deficiency of other specified B group vitamins: Secondary | ICD-10-CM | POA: Diagnosis not present

## 2020-06-17 DIAGNOSIS — A419 Sepsis, unspecified organism: Secondary | ICD-10-CM

## 2020-07-05 DEATH — deceased

## 2020-07-16 ENCOUNTER — Ambulatory Visit: Payer: Medicare HMO | Admitting: Family Medicine

## 2021-01-21 NOTE — Progress Notes (Deleted)
Subjective:   Kristie Morris is a 87 y.o. female who presents for Medicare Annual (Subsequent) preventive examination.  Review of Systems    N/A        Objective:    There were no vitals filed for this visit. There is no height or weight on file to calculate BMI.  Advanced Directives 04/17/2020 04/17/2020 02/21/2020 08/31/2017 08/14/2017 07/23/2015 04/22/2015  Does Patient Have a Medical Advance Directive? Yes No Yes No No No No  Type of Advance Directive Healthcare Power of [place]  Does patient want to make changes to medical advance directive? No - Patient declined - No - Patient declined - - - -  Copy of [place] in Chart? No - copy requested - No - copy requested - - - -  Would patient like information on creating a medical advance directive? No - Patient declined No - Patient declined No - Patient declined No - Patient declined No - Patient declined No - patient declined information -  Pre-existing out of facility DNR order (yellow form or pink MOST form) - - - - - - -    Current Medications (verified) Outpatient Encounter Medications as of 01/22/2021  Medication Sig  . amLODipine (NORVASC) 10 MG tablet Take 1 tablet (10 mg total) by mouth in the morning.  Marland Kitchen aspirin EC 81 MG tablet Take 1 tablet (81 mg total) by mouth in the morning.  . DULoxetine (CYMBALTA) 60 MG capsule Take 1 capsule (60 mg total) by mouth daily.  Marland Kitchen oxybutynin (DITROPAN) 5 MG tablet Take 2 tablets (10 mg total) by mouth at bedtime.  . rosuvastatin (CRESTOR) 5 MG tablet Take 1 tablet (5 mg total) by mouth every evening.   No facility-administered encounter medications on file as of 01/22/2021.    Allergies (verified) Zithromax [azithromycin], Celebrex [celecoxib], Ciprofloxacin, Metronidazole, Morphine and related, Moxifloxacin, Penicillins, Polymyxin b, Sulfa antibiotics, Vancomycin, and Vioxx [rofecoxib]   History: Past Medical History:   Diagnosis Date  . Anemia 02/02/2014  . Anxiety   . Arthritis    knee  . Asthma   . CAD (coronary artery disease) cardiologist-  dr Bronson Ing  ([facility] in [place])   2003  Cath with nonobstructive CAD  . Chest pain 08/14/2017  . Chronic headache   . Chronic lower back pain   . COPD (chronic obstructive pulmonary disease) ([place])   . Diastolic CHF, chronic ([place])   . Duodenal diverticulum   . Edema of left lower extremity   . Fibromyalgia   . Full dentures   . Gait disorder    uses walker  . GERD (gastroesophageal reflux disease)   . Headache disorder 01/17/2015  . Heart murmur   . History of breast cancer   . History of Clostridium difficile   . History of colitis    2001  . History of CVA with residual deficit    06/ 2015  residual left hemiparesis  . History of esophageal dilatation   . History of falling    last fall 06-25-2015  . History of hiatal hernia   . History of small bowel obstruction    04-25-2015  resolved without surgical intervention  . Lesion of bladder   . Low vision, one eye    right eye  . Macular degeneration of both eyes    right eye now low vision/  left eye getting injections currently  . Malignant hypertension   . OAB (  overactive bladder)   . Protein-calorie malnutrition, severe ([place]) 01/10/2015  . SBO (small bowel obstruction) ([place]) 04/22/2015  . Schatzki's ring   . Sciatica of right side 05/29/2015  . Syncope 08/14/2017  . Urge and stress incontinence   . Varicose veins   . Venous insufficiency, peripheral   . Vertigo    Past Surgical History:  Procedure Laterality Date  . [place]   from colon  . ABDOMINAL HYSTERECTOMY  1954   w/ bilateral salpingoophorectomy  . ANTERIOR CERVICAL DECOMP/DISCECTOMY FUSION  1971   "used bone off of my right hip"  . BREAST LUMPECTOMY Left 2006  . CARDIAC CATHETERIZATION  11-29-2001  dr Tressia Miners turner   Non-obstructive CAD/  20% pLAD,  30% pD1, 50% mRCA/  normal LVF  .  CATARACT EXTRACTION W/ INTRAOCULAR LENS  IMPLANT, BILATERAL  right 2008//  left 2015  . CYSTOSCOPY WITH HYDRODISTENSION AND BIOPSY N/A 07/23/2015   Procedure: CYSTOSCOPY;HYDRODISTENSION;  Surgeon: Irine Seal, MD;  Location[facility];  Service: Urology;  Laterality: N/A;  . DILATION AND CURETTAGE OF UTERUS  1953    miscarriage  . [place]  . KNEE ARTHROSCOPY Right 2005  . MASTECTOMY, PARTIAL Right 1999  . OVARIAN CYST REMOVAL  1946  . PATELLA FRACTURE SURGERY Left 2008  . TRANSTHORACIC ECHOCARDIOGRAM  01-09-2015   mild LVH,  ef 78-93%, grade I diastolic dysfunction/  mild MR/  trivial pericardial effusion was identified   Family History  Problem Relation Age of Onset  . Heart attack Mother   . CAD Mother   . Polycythemia Mother   . Heart attack Sister   . Cancer - Ovarian Sister   . Cancer - Colon Father   . Macular degeneration Sister   . Thyroid disease Daughter   . Rheumatic fever Daughter    Social History   Socioeconomic History  . Marital status: Widowed    Spouse name: Not on file  . Number of children: 2  . Years of education: 59  . Highest education level: Not on file  Occupational History  . Occupation: RETIRED  . Occupation: ACCOUNTANT  Tobacco Use  . Smoking status: Former Smoker    Packs/day: 0.50    Years: 15.00    Pack years: 7.50    Types: Cigarettes    Quit date: 07/17/1987    Years since quitting: 33.5  . Smokeless tobacco: Never Used  Vaping Use  . Vaping Use: Never used  Substance and Sexual Activity  . Alcohol use: Yes    Alcohol/week: 1.0 standard drink    Types: 1 Glasses of wine per week    Comment: rarely  . Drug use: No  . Sexual activity: Not on file  Other Topics Concern  . Not on file  Social History Narrative   Grandson and Granddaughter In Sports coach lives with her   Right handed      3 cats       Enjoy: mysteries- Ms Risk manager,  And spending time with cats      Diet: at least 2 meals a day: eats all  food groups   Caffeine: 1/2 caf coffee   Water: 4-6 cups daily (does add flavors in)      Wears seat belt    Smoke detectors    Social Determinants of Health   Financial Resource Strain: Not on file  Food Insecurity: Not on file  Transportation Needs: No Transportation Needs  . Lack of Transportation (Medical):  No  . Lack of Transportation (Non-Medical): No  Physical Activity: Not on file  Stress: Not on file  Social Connections: Moderately Isolated  . Frequency of Communication with Friends and Family: Twice a week  . Frequency of Social Gatherings with Friends and Family: Once a week  . Attends Religious Services: Never  . Active Member of Clubs or Organizations: No  . Attends Archivist Meetings: 1 to 4 times per year  . Marital Status: Widowed    Tobacco Counseling Counseling given: Not Answered   Clinical Intake:                 Diabetic?No         Activities of Daily Living In your present state of health, do you have any difficulty performing the following activities: 04/17/2020 01/22/2020  Hearing? N N  Vision? Y N  Difficulty concentrating or making decisions? Y N  Walking or climbing stairs? Y Y  Dressing or bathing? Y Y  Doing errands, shopping? Tempie Donning  Preparing Food and eating ? - Y  Using the Toilet? - Y  In the past six months, have you accidently leaked urine? - Y  Do you have problems with loss of bowel control? - N  Managing your Medications? - Y  Managing your Finances? - Y  Housekeeping or managing your Housekeeping? - Y  Some recent data might be hidden    Patient Care Team: [name], NP as PCP - General (Family Medicine) Herminio Commons, MD (Inactive) as PCP - Cardiology (Cardiology) Herminio Commons, MD (Inactive) as Attending Physician (Cardiology)  Indicate any recent Medical Services you may have received from other than [facility] providers in the past year (date may be approximate).     Assessment:    This is a routine wellness examination for Kristie Morris.  Hearing/Vision screen No exam data present  Dietary issues and exercise activities discussed:    Goals    . Increase physical activity     Try some chair exercises to improve mobility     . Prevent falls     Always ambulate with help        Depression Screen PHQ 2/9 Scores 02/21/2020 01/22/2020 10/17/2019 02/05/2015  PHQ - 2 Score 0 4 0 1  PHQ- 9 Score 0 7 - -    Fall Risk Fall Risk  02/21/2020 01/22/2020 01/16/2020 10/17/2019 02/05/2015  Falls in the past year? 1 0 1 1 Yes  Number falls in past yr: 0 1 1 1 2  or more  Injury with Fall? 1 0 1 1 Yes  Risk Factor Category  - - - - High Fall Risk  Risk for fall due to : History of fall(s);Impaired balance/gait;Impaired mobility - - - History of fall(s);Impaired balance/gait;Impaired vision;Impaired mobility  Follow up Falls evaluation completed;Education provided;Falls prevention discussed - - - Falls prevention discussed    FALL RISK PREVENTION PERTAINING TO THE HOME:  Any stairs in or around the home? {YES/NO:21197} If so, are there any without handrails? No  Home free of loose throw rugs in walkways, pet beds, electrical cords, etc? Yes  Adequate lighting in your home to reduce risk of falls? Yes   ASSISTIVE DEVICES UTILIZED TO PREVENT FALLS:  Life alert? {YES/NO:21197} Use of a cane, walker or w/c? {YES/NO:21197} Grab bars in the bathroom? {YES/NO:21197} Shower chair or bench in shower? {YES/NO:21197} Elevated toilet seat or a handicapped toilet? {YES/NO:21197}  TIMED UP AND GO:  Was the test performed? {YES/NO:21197}.  Length of time to ambulate 10 feet: *** sec.   {Appearance of TKZS:0109323}  Cognitive Function:     6CIT Screen 01/22/2020  What Year? 0 points  What month? 0 points  What time? 0 points  Count back from 20 0 points  Months in reverse 4 points  Repeat phrase 2 points  Total Score 6    Immunizations Immunization History  Administered Date(s)  Administered  . Influenza Nasal 07/27/2019  . Influenza-Unspecified 07/05/2014, 07/06/2015, 06/05/2018  . Moderna Sars-Covid-2 Vaccination 10/13/2019, 11/10/2019  . Pneumococcal Conjugate-13 06/07/2018  . Pneumococcal Polysaccharide-23 07/27/2019    TDAP status: Due, Education has been provided regarding the importance of this vaccine. Advised may receive this vaccine at local pharmacy or Health Dept. Aware to provide a copy of the vaccination record if obtained from local pharmacy or Health Dept. Verbalized acceptance and understanding.  Flu Vaccine status: Due, Education has been provided regarding the importance of this vaccine. Advised may receive this vaccine at local pharmacy or Health Dept. Aware to provide a copy of the vaccination record if obtained from local pharmacy or Health Dept. Verbalized acceptance and understanding.  Pneumococcal vaccine status: Up to date  Covid-19 vaccine status: Completed vaccines  Qualifies for Shingles Vaccine? Yes   Zostavax completed No   Shingrix Completed?: No.    Education has been provided regarding the importance of this vaccine. Patient has been advised to call insurance company to determine out of pocket expense if they have not yet received this vaccine. Advised may also receive vaccine at local pharmacy or Health Dept. Verbalized acceptance and understanding.  Screening Tests Health Maintenance  Topic Date Due  . TETANUS/TDAP  Never done  . DEXA SCAN  Never done  . COVID-19 Vaccine (3 - Moderna risk 4-dose series) 12/08/2019  . INFLUENZA VACCINE  05/05/2021  . PNA vac Low Risk Adult  Completed  . HPV VACCINES  Aged Out    Health Maintenance  Health Maintenance Due  Topic Date Due  . TETANUS/TDAP  Never done  . DEXA SCAN  Never done  . COVID-19 Vaccine (3 - Moderna risk 4-dose series) 12/08/2019    Colorectal cancer screening: No longer required.   Mammogram status: No longer required due to age.  Bone Density Status: No  longer required   Lung Cancer Screening: (Low Dose CT Chest recommended if Age 26-80 years, 30 pack-year currently smoking OR have quit w/in 15years.) does not qualify.   Lung Cancer Screening Referral: N/A   Additional Screening:  Hepatitis C Screening: does not qualify;   Vision Screening: Recommended annual ophthalmology exams for early detection of glaucoma and other disorders of the eye. Is the patient up to date with their annual eye exam?  {YES/NO:21197} Who is the provider or what is the name of the office in which the patient attends annual eye exams? *** If pt is not established with a provider, would they like to be referred to a provider to establish care? {YES/NO:21197}.   Dental Screening: Recommended annual dental exams for proper oral hygiene  Community Resource Referral / Chronic Care Management: CRR required this visit?  No   CCM required this visit?  No      Plan:     I have personally reviewed and noted the following in the patient's chart:   . Medical and social history . Use of alcohol, tobacco or illicit drugs  . Current medications and supplements . Functional ability and status . Nutritional status . Physical activity .  Advanced directives . List of other physicians . Hospitalizations, surgeries, and ER visits in previous 12 months . Vitals . Screenings to include cognitive, depression, and falls . Referrals and appointments  In addition, I have reviewed and discussed with patient certain preventive protocols, quality metrics, and best practice recommendations. A written personalized care plan for preventive services as well as general preventive health recommendations were provided to patient.     Ofilia Neas, LPN   [id number]/[id number]   Nurse Notes: None

## 2021-01-22 ENCOUNTER — Ambulatory Visit: Payer: Self-pay

## 2021-01-22 DIAGNOSIS — Z0000 Encounter for general adult medical examination without abnormal findings: Secondary | ICD-10-CM
